# Patient Record
Sex: Male | Born: 1980 | Race: White | Hispanic: No | Marital: Single | Smoking: Current every day smoker
Health system: Southern US, Community
[De-identification: ages and names within clinical notes are randomized; demographics above are authoritative.]

## PROBLEM LIST (undated history)

## (undated) ENCOUNTER — Emergency Department: Admission: EM | Payer: Medicaid Other | Source: Home / Self Care

## (undated) DIAGNOSIS — I10 Essential (primary) hypertension: Secondary | ICD-10-CM

## (undated) DIAGNOSIS — F1010 Alcohol abuse, uncomplicated: Secondary | ICD-10-CM

## (undated) DIAGNOSIS — K219 Gastro-esophageal reflux disease without esophagitis: Secondary | ICD-10-CM

## (undated) DIAGNOSIS — G4730 Sleep apnea, unspecified: Secondary | ICD-10-CM

## (undated) DIAGNOSIS — F1910 Other psychoactive substance abuse, uncomplicated: Secondary | ICD-10-CM

## (undated) DIAGNOSIS — J189 Pneumonia, unspecified organism: Secondary | ICD-10-CM

## (undated) DIAGNOSIS — B1920 Unspecified viral hepatitis C without hepatic coma: Secondary | ICD-10-CM

## (undated) DIAGNOSIS — F32A Depression, unspecified: Secondary | ICD-10-CM

## (undated) DIAGNOSIS — F419 Anxiety disorder, unspecified: Secondary | ICD-10-CM

## (undated) DIAGNOSIS — R7303 Prediabetes: Secondary | ICD-10-CM

---
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
## 2003-07-23 ENCOUNTER — Other Ambulatory Visit: Payer: Self-pay

## 2004-09-25 ENCOUNTER — Emergency Department: Payer: Self-pay | Admitting: Emergency Medicine

## 2004-09-26 ENCOUNTER — Emergency Department: Payer: Self-pay | Admitting: Internal Medicine

## 2005-01-05 ENCOUNTER — Emergency Department: Payer: Self-pay | Admitting: Emergency Medicine

## 2005-03-08 ENCOUNTER — Emergency Department: Payer: Self-pay | Admitting: General Practice

## 2005-06-06 ENCOUNTER — Emergency Department: Payer: Self-pay | Admitting: Emergency Medicine

## 2005-07-03 ENCOUNTER — Emergency Department: Payer: Self-pay | Admitting: Emergency Medicine

## 2005-10-30 ENCOUNTER — Emergency Department: Payer: Self-pay | Admitting: Emergency Medicine

## 2006-02-23 ENCOUNTER — Inpatient Hospital Stay: Payer: Self-pay | Admitting: Unknown Physician Specialty

## 2006-04-15 ENCOUNTER — Emergency Department: Payer: Self-pay | Admitting: Emergency Medicine

## 2006-07-17 ENCOUNTER — Emergency Department: Payer: Self-pay | Admitting: Emergency Medicine

## 2006-09-11 ENCOUNTER — Emergency Department: Payer: Self-pay

## 2007-01-20 ENCOUNTER — Emergency Department: Payer: Self-pay

## 2007-03-29 ENCOUNTER — Emergency Department: Payer: Self-pay | Admitting: Emergency Medicine

## 2007-05-27 ENCOUNTER — Emergency Department: Payer: Self-pay | Admitting: Emergency Medicine

## 2007-11-08 ENCOUNTER — Emergency Department: Payer: Self-pay | Admitting: Emergency Medicine

## 2008-01-19 ENCOUNTER — Emergency Department: Payer: Self-pay | Admitting: Emergency Medicine

## 2008-02-19 ENCOUNTER — Emergency Department: Payer: Self-pay | Admitting: Emergency Medicine

## 2008-08-20 ENCOUNTER — Inpatient Hospital Stay: Payer: Self-pay | Admitting: Psychiatry

## 2008-09-12 ENCOUNTER — Emergency Department: Payer: Self-pay | Admitting: Internal Medicine

## 2010-07-10 ENCOUNTER — Emergency Department: Payer: Self-pay | Admitting: Emergency Medicine

## 2010-10-29 ENCOUNTER — Emergency Department: Payer: Self-pay | Admitting: Emergency Medicine

## 2010-12-29 ENCOUNTER — Emergency Department: Payer: Self-pay | Admitting: Emergency Medicine

## 2011-01-11 ENCOUNTER — Emergency Department: Payer: Self-pay | Admitting: Emergency Medicine

## 2011-01-29 ENCOUNTER — Emergency Department: Payer: Self-pay | Admitting: Emergency Medicine

## 2011-02-17 ENCOUNTER — Emergency Department: Payer: Self-pay | Admitting: Emergency Medicine

## 2011-03-22 ENCOUNTER — Emergency Department: Payer: Self-pay | Admitting: Emergency Medicine

## 2011-05-07 ENCOUNTER — Ambulatory Visit: Payer: Self-pay | Admitting: Gastroenterology

## 2011-05-11 ENCOUNTER — Emergency Department: Payer: Self-pay | Admitting: Unknown Physician Specialty

## 2011-06-24 ENCOUNTER — Emergency Department: Payer: Self-pay | Admitting: Emergency Medicine

## 2011-09-26 ENCOUNTER — Emergency Department: Payer: Self-pay | Admitting: Emergency Medicine

## 2011-09-26 LAB — COMPREHENSIVE METABOLIC PANEL
Alkaline Phosphatase: 73 U/L (ref 50–136)
Anion Gap: 9 (ref 7–16)
Calcium, Total: 8.6 mg/dL (ref 8.5–10.1)
Co2: 29 mmol/L (ref 21–32)
EGFR (Non-African Amer.): >60
Osmolality: 281 (ref 275–301)
Potassium: 4.5 mmol/L (ref 3.5–5.1)
SGPT (ALT): 169 U/L — ABNORMAL HIGH
Sodium: 141 mmol/L (ref 136–145)

## 2011-09-26 LAB — CBC
HCT: 42.6 % (ref 40.0–52.0)
HGB: 14.8 g/dL (ref 13.0–18.0)
MCHC: 34.8 g/dL (ref 32.0–36.0)
RBC: 4.46 10*6/uL (ref 4.40–5.90)

## 2012-07-23 ENCOUNTER — Emergency Department: Payer: Self-pay | Admitting: Emergency Medicine

## 2012-07-25 ENCOUNTER — Emergency Department: Payer: Self-pay | Admitting: Emergency Medicine

## 2012-07-25 LAB — RAPID INFLUENZA A&B ANTIGENS

## 2012-08-30 ENCOUNTER — Emergency Department: Payer: Self-pay | Admitting: Emergency Medicine

## 2012-12-17 ENCOUNTER — Emergency Department: Payer: Self-pay | Admitting: Emergency Medicine

## 2012-12-17 LAB — DRUG SCREEN, URINE
Amphetamines, Ur Screen: NEGATIVE (ref ?–1000)
Barbiturates, Ur Screen: NEGATIVE (ref ?–200)
Methadone, Ur Screen: NEGATIVE (ref ?–300)
Opiate, Ur Screen: POSITIVE (ref ?–300)
Phencyclidine (PCP) Ur S: NEGATIVE (ref ?–25)

## 2012-12-17 LAB — URINALYSIS, COMPLETE
Blood: NEGATIVE
Glucose,UR: NEGATIVE mg/dL (ref 0–75)
Ketone: NEGATIVE
Nitrite: NEGATIVE
Specific Gravity: 1.021 (ref 1.003–1.030)
WBC UR: 1 /HPF (ref 0–5)

## 2012-12-17 LAB — COMPREHENSIVE METABOLIC PANEL
BUN: 8 mg/dL (ref 7–18)
Co2: 30 mmol/L (ref 21–32)
EGFR (African American): >60
Glucose: 121 mg/dL — ABNORMAL HIGH (ref 65–99)
Potassium: 3.7 mmol/L (ref 3.5–5.1)
SGOT(AST): 39 U/L — ABNORMAL HIGH (ref 15–37)
Sodium: 135 mmol/L — ABNORMAL LOW (ref 136–145)
Total Protein: 8.4 g/dL — ABNORMAL HIGH (ref 6.4–8.2)

## 2012-12-17 LAB — CBC
HGB: 17.5 g/dL (ref 13.0–18.0)
MCHC: 34.9 g/dL (ref 32.0–36.0)
MCV: 95 fL (ref 80–100)
Platelet: 274 10*3/uL (ref 150–440)
RBC: 5.28 10*6/uL (ref 4.40–5.90)

## 2012-12-17 LAB — ETHANOL: Ethanol: 42 mg/dL

## 2013-01-13 ENCOUNTER — Emergency Department: Payer: Self-pay

## 2013-02-12 ENCOUNTER — Emergency Department: Payer: Self-pay | Admitting: Unknown Physician Specialty

## 2013-02-12 LAB — DRUG SCREEN, URINE
Amphetamines, Ur Screen: NEGATIVE (ref ?–1000)
Barbiturates, Ur Screen: NEGATIVE (ref ?–200)
Benzodiazepine, Ur Scrn: NEGATIVE (ref ?–200)
Cannabinoid 50 Ng, Ur ~~LOC~~: NEGATIVE (ref ?–50)
MDMA (Ecstasy)Ur Screen: POSITIVE (ref ?–500)
Opiate, Ur Screen: NEGATIVE (ref ?–300)
Phencyclidine (PCP) Ur S: NEGATIVE (ref ?–25)
Tricyclic, Ur Screen: NEGATIVE (ref ?–1000)

## 2013-02-12 LAB — COMPREHENSIVE METABOLIC PANEL
Alkaline Phosphatase: 106 U/L (ref 50–136)
Anion Gap: 10 (ref 7–16)
BUN: 8 mg/dL (ref 7–18)
Calcium, Total: 8.7 mg/dL (ref 8.5–10.1)
Co2: 23 mmol/L (ref 21–32)
Creatinine: 0.94 mg/dL (ref 0.60–1.30)
EGFR (African American): >60
EGFR (Non-African Amer.): >60
Osmolality: 278 (ref 275–301)
SGOT(AST): 74 U/L — ABNORMAL HIGH (ref 15–37)
Sodium: 139 mmol/L (ref 136–145)
Total Protein: 7.6 g/dL (ref 6.4–8.2)

## 2013-02-12 LAB — CBC
HCT: 47.2 % (ref 40.0–52.0)
HGB: 16.1 g/dL (ref 13.0–18.0)
MCH: 32.4 pg (ref 26.0–34.0)
MCHC: 34.2 g/dL (ref 32.0–36.0)
RDW: 13.4 % (ref 11.5–14.5)
WBC: 10.5 10*3/uL (ref 3.8–10.6)

## 2013-02-12 LAB — URINALYSIS, COMPLETE
Bacteria: NONE SEEN
Bilirubin,UR: NEGATIVE
RBC,UR: 2 /HPF (ref 0–5)
Specific Gravity: 1.019 (ref 1.003–1.030)
Squamous Epithelial: <1
WBC UR: 3 /HPF (ref 0–5)

## 2013-02-12 LAB — ETHANOL: Ethanol %: 0.042 % (ref 0.000–0.080)

## 2013-02-12 LAB — TSH: Thyroid Stimulating Horm: 1.3 u[IU]/mL

## 2013-03-21 ENCOUNTER — Emergency Department: Payer: Self-pay | Admitting: Emergency Medicine

## 2013-05-13 ENCOUNTER — Emergency Department: Payer: Self-pay | Admitting: Emergency Medicine

## 2016-07-07 ENCOUNTER — Emergency Department: Admission: EM | Admit: 2016-07-07 | Discharge: 2016-07-07 | Discharge status: Against Medical Advice | Payer: Self-pay

## 2016-07-08 ENCOUNTER — Encounter: Payer: Self-pay | Admitting: Medical Oncology

## 2016-07-08 ENCOUNTER — Emergency Department
Admission: EM | Admit: 2016-07-08 | Discharge: 2016-07-08 | Disposition: A | Discharge status: Home / Self Care | Payer: Medicaid Other | Attending: Emergency Medicine | Admitting: Emergency Medicine

## 2016-07-08 DIAGNOSIS — F17200 Nicotine dependence, unspecified, uncomplicated: Secondary | ICD-10-CM | POA: Diagnosis not present

## 2016-07-08 DIAGNOSIS — R51 Headache: Secondary | ICD-10-CM | POA: Diagnosis present

## 2016-07-08 DIAGNOSIS — K047 Periapical abscess without sinus: Secondary | ICD-10-CM

## 2016-07-08 DIAGNOSIS — Z79899 Other long term (current) drug therapy: Secondary | ICD-10-CM | POA: Diagnosis not present

## 2016-07-08 DIAGNOSIS — J0100 Acute maxillary sinusitis, unspecified: Secondary | ICD-10-CM | POA: Diagnosis not present

## 2016-07-08 MED ORDER — CLINDAMYCIN HCL 150 MG PO CAPS: 150.0000 mg | ORAL_CAPSULE | Freq: Four times a day (QID) | ORAL | 0 refills | Status: DC

## 2016-07-08 MED ORDER — NAPROXEN 500 MG PO TABS: 500.0000 mg | ORAL_TABLET | Freq: Two times a day (BID) | ORAL | Status: DC

## 2016-07-08 MED ORDER — TRAMADOL HCL 50 MG PO TABS: 50.0000 mg | ORAL_TABLET | Freq: Four times a day (QID) | ORAL | 0 refills | Status: DC | PRN

## 2016-07-08 MED ORDER — FEXOFENADINE-PSEUDOEPHED ER 60-120 MG PO TB12: 1.0000 | ORAL_TABLET | Freq: Two times a day (BID) | ORAL | 0 refills | Status: DC

## 2016-07-08 NOTE — ED Notes (Signed)
See triage note  States he is having pain to left side of face possible toothache   Also having some sinus pressure and draiange

## 2016-07-08 NOTE — ED Provider Notes (Signed)
[facility] Emergency Department Provider Note   ____________________________________________   First MD Initiated Contact with Patient 07/08/16 [id number]     (approximate)  I have reviewed the triage vital signs and the nursing notes.   HISTORY  Chief Complaint Dental Pain and Facial Pain    HPI Nicholas Valdez is a 35 y.o. male patient complaining of sinus pressure and dental pain for 1 week. Patient has a long-standing history of orthodontic problems. Patient state he is experiencing increased left facial pressure and nasal congestion for 1 week. Patient rated his pain as 8/10. No palliative measures for this complaint.  History reviewed. No pertinent past medical history.  There are no active problems to display for this patient.   History reviewed. No pertinent surgical history.  Prior to Admission medications   Medication Sig Start Date End Date Taking? Authorizing Provider  clindamycin (CLEOCIN) 150 MG capsule Take 1 capsule (150 mg total) by mouth 4 (four) times daily. 07/08/16   Joni Reiningonald K Farha Dano, PA-C  fexofenadine-pseudoephedrine (ALLEGRA-D) 60-120 MG 12 hr tablet Take 1 tablet by mouth 2 (two) times daily. 07/08/16   Joni Reiningonald K Anihya Tuma, PA-C  naproxen (NAPROSYN) 500 MG tablet Take 1 tablet (500 mg total) by mouth 2 (two) times daily with a meal. 07/08/16   Joni Reiningonald K Acelin Ferdig, PA-C  traMADol (ULTRAM) 50 MG tablet Take 1 tablet (50 mg total) by mouth every 6 (six) hours as needed for moderate pain. 07/08/16   Joni Reiningonald K Kamori Barbier, PA-C    Allergies Penicillins and Zofran Frazier Richards[ondansetron hcl]  No family history on file.  Social History Social History  Substance Use Topics  . Smoking status: Current Every Day Smoker  . Smokeless tobacco: Never Used  . Alcohol use No    Review of Systems Constitutional: No fever/chills Eyes: No visual changes. ENT: No sore throat. Dental and sinus pain  Cardiovascular: Denies chest pain. Respiratory: Denies shortness of  breath. Gastrointestinal: No abdominal pain.  No nausea, no vomiting.  No diarrhea.  No constipation. Genitourinary: Negative for dysuria. Musculoskeletal: Negative for back pain. Skin: Negative for rash. Neurological: Negative for headaches, focal weakness or numbness. Allergic/Immunilogical: See medication list _________________________________________   PHYSICAL EXAM:  VITAL SIGNS: ED Triage Vitals  Enc Vitals Group     BP 07/08/16 0757 (!) 148/96     Pulse Rate 07/08/16 0757 78     Resp 07/08/16 0757 18     Temp 07/08/16 0757 97.8 F (36.6 C)     Temp Source 07/08/16 0757 Oral     SpO2 07/08/16 0757 98 %     Weight 07/08/16 0754 215 lb (97.5 kg)     Height 07/08/16 0754 6' (1.829 m)     Head Circumference --      Peak Flow --      Pain Score 07/08/16 0754 8     Pain Loc --      Pain Edu? --      Excl. in GC? --     Constitutional: Alert and oriented. Well appearing and in no acute distress. Eyes: Conjunctivae are normal. PERRL. EOMI. Head: Atraumatic. Nose: Edematous nasal turbinates with thick rhinorrhea. Bilateral maxillary guarding. Mouth/Throat: Mucous membranes are moist.  Oropharynx non-erythematous.Devitalize tooth December 2 and 3 Neck: No stridor.  No cervical spine tenderness to palpation. Hematological/Lymphatic/Immunilogical: No cervical lymphadenopathy. Cardiovascular: Normal rate, regular rhythm. Grossly normal heart sounds.  Good peripheral circulation. Respiratory: Normal respiratory effort.  No retractions. Lungs CTAB. Gastrointestinal: Soft and nontender. No  distention. No abdominal bruits. No CVA tenderness. Musculoskeletal: No lower extremity tenderness nor edema.  No joint effusions. Neurologic:  Normal speech and language. No gross focal neurologic deficits are appreciated. No gait instability. Skin:  Skin is warm, dry and intact. No rash noted. Psychiatric: Mood and affect are normal. Speech and behavior are  normal.  ____________________________________________   LABS (all labs ordered are listed, but only abnormal results are displayed)  Labs Reviewed - No data to display ____________________________________________  EKG   ____________________________________________  RADIOLOGY   ____________________________________________   PROCEDURES  Procedure(s) performed: None  Procedures  Critical Care performed: No  ____________________________________________   INITIAL IMPRESSION / ASSESSMENT AND PLAN / ED COURSE  Pertinent labs & imaging results that were available during my care of the patient were reviewed by me and considered in my medical decision making (see chart for details).  Dental pain and sinusitis. Patient given discharge care instructions. Patient advised follow-up with the [facility] dental clinic.  Clinical Course      ____________________________________________   FINAL CLINICAL IMPRESSION(S) / ED DIAGNOSES  Final diagnoses:  Dental infection  Subacute maxillary sinusitis      NEW MEDICATIONS STARTED DURING THIS VISIT:  New Prescriptions   CLINDAMYCIN (CLEOCIN) 150 MG CAPSULE    Take 1 capsule (150 mg total) by mouth 4 (four) times daily.   FEXOFENADINE-PSEUDOEPHEDRINE (ALLEGRA-D) 60-120 MG 12 HR TABLET    Take 1 tablet by mouth 2 (two) times daily.   NAPROXEN (NAPROSYN) 500 MG TABLET    Take 1 tablet (500 mg total) by mouth 2 (two) times daily with a meal.   TRAMADOL (ULTRAM) 50 MG TABLET    Take 1 tablet (50 mg total) by mouth every 6 (six) hours as needed for moderate pain.     Note:  This document was prepared using Dragon voice recognition software and may include unintentional dictation errors.    Joni Reining, PA-C 07/08/16 0900    Rockne Menghini, MD 07/08/16 1610

## 2016-07-08 NOTE — ED Triage Notes (Signed)
Pt reports sinus pressure/pain and dental pain for 1 week.

## 2016-09-17 ENCOUNTER — Emergency Department
Admission: EM | Admit: 2016-09-17 | Discharge: 2016-09-17 | Disposition: A | Discharge status: Home / Self Care | Payer: Medicaid Other | Attending: Emergency Medicine | Admitting: Emergency Medicine

## 2016-09-17 ENCOUNTER — Encounter: Payer: Self-pay | Admitting: Emergency Medicine

## 2016-09-17 DIAGNOSIS — J111 Influenza due to unidentified influenza virus with other respiratory manifestations: Secondary | ICD-10-CM | POA: Insufficient documentation

## 2016-09-17 DIAGNOSIS — F17200 Nicotine dependence, unspecified, uncomplicated: Secondary | ICD-10-CM | POA: Diagnosis not present

## 2016-09-17 DIAGNOSIS — R05 Cough: Secondary | ICD-10-CM | POA: Diagnosis present

## 2016-09-17 MED ORDER — OSELTAMIVIR PHOSPHATE 75 MG PO CAPS: 75.0000 mg | ORAL_CAPSULE | Freq: Two times a day (BID) | ORAL | 0 refills | Status: AC

## 2016-09-17 MED ORDER — BENZONATATE 100 MG PO CAPS: 100.0000 mg | ORAL_CAPSULE | Freq: Three times a day (TID) | ORAL | 0 refills | Status: AC | PRN

## 2016-09-17 NOTE — ED Triage Notes (Signed)
Pt with fever, congestion and diarrhea.

## 2016-09-17 NOTE — ED Provider Notes (Signed)
[facility] Emergency Department Provider Note  ____________________________________________  Time seen: Approximately 6:55 PM  I have reviewed the triage vital signs and the nursing notes.   HISTORY  Chief Complaint Fever and Nasal Congestion    HPI Nicholas Valdez is a 36 y.o. male presenting to the emergency department with headache, congestion, rhinorrhea, nonproductive cough, diarrhea, myalgias and fever for the past three days. Fever has been as high as 101F assessed orally. Patient's daughter was diagnosed with influenza last week.Patient is tolerating fluids by mouth. He has been eating soup. Patient denies chest pain, chest tightness, shortness of breath, abdominal pain, nausea and vomiting. Patient has been taking Sudafed. No other alleviating measures have been attempted. Patient is a daily smoker.     History reviewed. No pertinent past medical history.  There are no active problems to display for this patient.   History reviewed. No pertinent surgical history.  Prior to Admission medications   Medication Sig Start Date End Date Taking? Authorizing Provider  benzonatate (TESSALON PERLES) 100 MG capsule Take 1 capsule (100 mg total) by mouth 3 (three) times daily as needed for cough. 09/17/16 09/24/16  Orvil Feil, PA-C  clindamycin (CLEOCIN) 150 MG capsule Take 1 capsule (150 mg total) by mouth 4 (four) times daily. 07/08/16   Joni Reining, PA-C  fexofenadine-pseudoephedrine (ALLEGRA-D) 60-120 MG 12 hr tablet Take 1 tablet by mouth 2 (two) times daily. 07/08/16   Joni Reining, PA-C  naproxen (NAPROSYN) 500 MG tablet Take 1 tablet (500 mg total) by mouth 2 (two) times daily with a meal. 07/08/16   Joni Reining, PA-C  oseltamivir (TAMIFLU) 75 MG capsule Take 1 capsule (75 mg total) by mouth 2 (two) times daily. 09/17/16 09/22/16  Orvil Feil, PA-C  traMADol (ULTRAM) 50 MG tablet Take 1 tablet (50 mg total) by mouth every 6 (six) hours as  needed for moderate pain. 07/08/16   Joni Reining, PA-C    Allergies Penicillins and Zofran Frazier Richards hcl]  No family history on file.  Social History Social History  Substance Use Topics  . Smoking status: Current Every Day Smoker  . Smokeless tobacco: Never Used  . Alcohol use No    Review of Systems  Constitutional: Patient has had fever.  Eyes: No visual changes. No discharge ENT: Patient has had congestion.  Cardiovascular: no chest pain. Respiratory: Patient has had non-productive cough.  No SOB. Gastrointestinal: Patient has had nausea.  Genitourinary: Negative for dysuria. No hematuria Musculoskeletal: Patient has had myalgias. Skin: Negative for rash, abrasions, lacerations, ecchymosis. Neurological: Negative for headaches, focal weakness or numbness.   ____________________________________________   PHYSICAL EXAM:  VITAL SIGNS: ED Triage Vitals  Enc Vitals Group     BP 09/17/16 1600 114/75     Pulse Rate 09/17/16 1600 (!) 116     Resp 09/17/16 1600 20     Temp 09/17/16 1600 97.5 F (36.4 C)     Temp Source 09/17/16 1600 Oral     SpO2 09/17/16 1600 99 %     Weight 09/17/16 1600 230 lb (104.3 kg)     Height 09/17/16 1600 6' (1.829 m)     Head Circumference --      Peak Flow --      Pain Score 09/17/16 1601 3     Pain Loc --      Pain Edu? --      Excl. in GC? --      Constitutional: Alert and  oriented. Patient is lying supine in bed.  Eyes: Conjunctivae are normal. PERRL. EOMI. Head: Atraumatic. ENT:      Ears: Tympanic membranes are injected bilaterally without evidence of effusion or purulent exudate. Bony landmarks are visualized bilaterally. No pain with palpation at the tragus.      Nose: Nasal turbinates are edematous and erythematous. Trace rhinorrhea visualized.      Mouth/Throat: Mucous membranes are moist. Posterior pharynx is mildly erythematous. No tonsillar hypertrophy or purulent exudate. Uvula is midline. Neck: Full range of  motion. No pain is elicited with flexion at the neck. Hematological/Lymphatic/Immunilogical: No cervical lymphadenopathy. Cardiovascular: Normal rate, regular rhythm. Normal S1 and S2.  Good peripheral circulation. Respiratory: Normal respiratory effort without tachypnea or retractions. Lungs CTAB. Good air entry to the bases with no decreased or absent breath sounds. Gastrointestinal: Bowel sounds 4 quadrants. Soft and nontender to palpation. No guarding or rigidity. No palpable masses. No distention. No CVA tenderness.  Skin:  Skin is warm, dry and intact. No rash noted. Psychiatric: Mood and affect are normal. Speech and behavior are normal. Patient exhibits appropriate insight and judgement.  ____________________________________________   LABS (all labs ordered are listed, but only abnormal results are displayed)  Labs Reviewed - No data to display ____________________________________________  EKG   ____________________________________________  RADIOLOGY   No results found.  ____________________________________________    PROCEDURES  Procedure(s) performed:    Procedures    Medications - No data to display   ____________________________________________   INITIAL IMPRESSION / ASSESSMENT AND PLAN / ED COURSE  Pertinent labs & imaging results that were available during my care of the patient were reviewed by me and considered in my medical decision making (see chart for details).  Review of the [place] CSRS was performed in accordance of the [facility] prior to dispensing any controlled drugs.     Assessment and Plan: Influenza:  Patient presents to the emergency department with headache, congestion, rhinorrhea, nonproductive cough, diarrhea, myalgias and fever for the past three days. Patient's symptoms are consistent with influenza. Patient was discharged with Tamiflu and Tessalon Perles. Patient was advised to follow-up with his primary care provider in one week.  Physical exam and vital signs are reassuring at this time. All patient questions were answered.  ____________________________________________  FINAL CLINICAL IMPRESSION(S) / ED DIAGNOSES  Final diagnoses:  Influenza      NEW MEDICATIONS STARTED DURING THIS VISIT:  Discharge Medication List as of 09/17/2016  7:03 PM    START taking these medications   Details  benzonatate (TESSALON PERLES) 100 MG capsule Take 1 capsule (100 mg total) by mouth 3 (three) times daily as needed for cough., Starting Tue 09/17/2016, Until Tue 09/24/2016, Print    oseltamivir (TAMIFLU) 75 MG capsule Take 1 capsule (75 mg total) by mouth 2 (two) times daily., Starting Tue 09/17/2016, Until Sun 09/22/2016, Print            This chart was dictated using voice recognition software/Dragon. Despite best efforts to proofread, errors can occur which can change the meaning. Any change was purely unintentional.    Orvil Feil, PA-C 09/18/16 0041    Governor Rooks, MD 09/18/16 [phone]

## 2016-09-17 NOTE — ED Notes (Addendum)
Pt reports that he has been runny a fever this weekend with a max of 101 - his daughter had the flu last week and he wants to be checked - he has nasal congestion with a cough - he would like a work note for tonight - pt also reports having diarrhea since Saturday (4-5 loose stools in the last 24 hours)

## 2016-10-01 ENCOUNTER — Emergency Department
Admission: EM | Admit: 2016-10-01 | Discharge: 2016-10-01 | Disposition: A | Discharge status: Home / Self Care | Payer: Medicaid Other | Attending: Emergency Medicine | Admitting: Emergency Medicine

## 2016-10-01 ENCOUNTER — Encounter: Payer: Self-pay | Admitting: *Deleted

## 2016-10-01 DIAGNOSIS — B349 Viral infection, unspecified: Secondary | ICD-10-CM | POA: Diagnosis not present

## 2016-10-01 DIAGNOSIS — F17200 Nicotine dependence, unspecified, uncomplicated: Secondary | ICD-10-CM | POA: Diagnosis not present

## 2016-10-01 DIAGNOSIS — K029 Dental caries, unspecified: Secondary | ICD-10-CM | POA: Insufficient documentation

## 2016-10-01 DIAGNOSIS — R0981 Nasal congestion: Secondary | ICD-10-CM | POA: Diagnosis present

## 2016-10-01 MED ORDER — IBUPROFEN 600 MG PO TABS: 600.0000 mg | ORAL_TABLET | Freq: Three times a day (TID) | ORAL | 0 refills | Status: DC | PRN

## 2016-10-01 MED ORDER — PROMETHAZINE HCL 25 MG PO TABS: 25.0000 mg | ORAL_TABLET | Freq: Three times a day (TID) | ORAL | 0 refills | Status: DC | PRN

## 2016-10-01 MED ORDER — PSEUDOEPH-BROMPHEN-DM 30-2-10 MG/5ML PO SYRP: 5.0000 mL | ORAL_SOLUTION | Freq: Four times a day (QID) | ORAL | 0 refills | Status: DC | PRN

## 2016-10-01 NOTE — ED Triage Notes (Signed)
States he was diagnosed with the flu last week and states still not feeling nay better and he needs a work note, also states he needs information about dental clinic, awake and alert in no acute distress

## 2016-10-01 NOTE — Discharge Instructions (Signed)
May follow-up for list of dental clinics provided. OPTIONS FOR DENTAL FOLLOW UP CARE  [place] Department of Health and Human Services - Local Safety Net Dental Clinics [email or web address]   [facility] [phone])  [facility] [phone])  [facility] [phone])  [facility] [phone])  [facility] [phone]) This clinic caters to the indigent population and is on a lottery system. Location: [employer] of Dentistry, [employer], [address] [place] Clinic Hours: Wednesdays from 6pm - 9pm, patients seen by a lottery system. For dates, call or go to [email or web address] Services: Cleanings, fillings and simple extractions. Payment Options: DENTAL WORK IS FREE OF CHARGE. Bring proof of income or support. Best way to get seen: Arrive at 5:15 pm - this is a lottery, NOT first come/first serve, so arriving earlier will not increase your chances of being seen.     [facility] [phone] Select option 1 for emergencies   Location: [facility]istry, [place], [address] [place] Clinic Hours: No walk-ins accepted - call the day before to schedule an appointment. Check in times are 9:30 am and 1:30 pm. Services: Simple extractions, temporary fillings, pulpectomy/pulp debridement, uncomplicated abscess drainage. Payment Options: PAYMENT IS DUE AT THE TIME OF SERVICE.  Fee is usually $100-200, additional surgical procedures (e.g. abscess drainage) may be extra. Cash, checks, Visa/MasterCard accepted.  Can file Medicaid if patient is covered for dental - patient should call case worker to check. No discount for [facility] patients. Best way to get seen: MUST call the day before and get onto the schedule. Can usually be seen the next 1-2 days. No walk-ins accepted.      [facility] [phone]   Location: [facility], [address] [place] Clinic Hours: M, W, Th, F 8am or 1:30pm, Tues 9a or 1:30 - first come/first served. Services: Simple extractions, temporary fillings, uncomplicated abscess drainage.  You do not need to be an [facility] resident. Payment Options: PAYMENT IS DUE AT THE TIME OF SERVICE. Dental insurance, otherwise sliding scale - bring proof of income or support. Depending on income and treatment needed, cost is usually $50-200. Best way to get seen: Arrive early as it is first come/first served.     [facility] [phone]   Location: [address] Clinic Hours: Mon-Thu 8a-5p Services: Most basic dental services including extractions and fillings. Payment Options: PAYMENT IS DUE AT THE TIME OF SERVICE. Sliding scale, up to 50% off - bring proof if income or support. Medicaid with dental option accepted. Best way to get seen: Call to schedule an appointment, can usually be seen within 2 weeks OR they will try to see walk-ins - show up at 8a or 2p (you may have to wait).     [facility] [phone] [place] RESIDENTS ONLY   Location: [facility], [address], [place], [place] [address] Clinic Hours: By appointment only. Monday - Thursday 8am-5pm, Friday 8am-12pm Services: Cleanings, fillings, extractions. Payment Options: PAYMENT IS DUE AT THE TIME OF SERVICE. Cash, Visa or MasterCard. Sliding scale - $30 minimum per service. Best way to get seen: Come in to office, complete packet and make an appointment - need proof of income or support monies for each household member and proof of [facility] residence. Usually takes about a month to get in.     [facility] °[phone] °  °Location: °[address] [place] °Clinic Hours: Walk-in Urgent Care  Dental Services are offered Monday-Friday mornings only. °The numbers of emergencies accepted daily is limited to the number of °providers available. °Maximum 15 - Mondays, Wednesdays & Thursdays °Maximum 10 - Tuesdays & Fridays °Services: °You do not need to be a [place] resident to be seen for a dental emergency. °Emergencies are defined as pain, swelling, abnormal bleeding, or dental trauma. Walkins will receive x-rays if needed. °NOTE: Dental cleaning is not an emergency. °Payment Options: °PAYMENT IS DUE AT THE TIME OF SERVICE. °Minimum co-pay is $40.00 for uninsured patients. °Minimum co-pay is $3.00 for Medicaid with dental coverage. °Dental Insurance is accepted and must be presented at time of visit. °Medicare does not cover dental. °Forms of payment: Cash, credit card, checks. °Best way to get seen: °If not previously registered with the clinic, walk-in dental registration begins at 7:15 am and is on a first come/first serve basis. °If previously registered with the clinic, call to make an appointment. °  °  °[facility] °[phone] °[place] RESIDENTS ONLY °  °Location: °[address], [place], [place] °Clinic Hours: °Mon-Thu 10a-2p °Services: Extractions only! °Payment Options: °FREE (donations accepted) - bring proof of income or support °Best way to get seen: °Call and schedule an appointment OR come at 8am on the 1st Monday of every month (except for holidays) when it is first come/first served. °  °  °[facility] °[phone] °  °Location: °[address], [place] °Clinic Hours: °Friday mornings °Services, Payment Options, Best way to get seen: °Call for info ° °

## 2016-10-01 NOTE — ED Provider Notes (Signed)
[facility] Emergency Department Provider Note   ____________________________________________   First MD Initiated Contact with Patient 10/01/16 1606     (approximate)  I have reviewed the triage vital signs and the nursing notes.   HISTORY  Chief Complaint Cough; Generalized Body Aches; and Dental Pain    HPI Nicholas Valdez is a 36 y.o. male patient states having flulike symptoms even though he was tested and treated for flu which and half ago. Patient state he is having nasal congestion, runny nose, body aches, and nausea with vomiting. Patient also states dental pain secondary to a cracked tooth which occurred 2 days ago. Patient requested information for dental clinic.Patient rates his pain as a 3/10. Patient describes pain as "achy". No palliative measures complaints.   History reviewed. No pertinent past medical history.  There are no active problems to display for this patient.   History reviewed. No pertinent surgical history.  Prior to Admission medications   Medication Sig Start Date End Date Taking? Authorizing Provider  brompheniramine-pseudoephedrine-DM 30-2-10 MG/5ML syrup Take 5 mLs by mouth 4 (four) times daily as needed. 10/01/16   Joni Reining, PA-C  clindamycin (CLEOCIN) 150 MG capsule Take 1 capsule (150 mg total) by mouth 4 (four) times daily. 07/08/16   Joni Reining, PA-C  fexofenadine-pseudoephedrine (ALLEGRA-D) 60-120 MG 12 hr tablet Take 1 tablet by mouth 2 (two) times daily. 07/08/16   Joni Reining, PA-C  ibuprofen (ADVIL,MOTRIN) 600 MG tablet Take 1 tablet (600 mg total) by mouth every 8 (eight) hours as needed. 10/01/16   Joni Reining, PA-C  naproxen (NAPROSYN) 500 MG tablet Take 1 tablet (500 mg total) by mouth 2 (two) times daily with a meal. 07/08/16   Joni Reining, PA-C  promethazine (PHENERGAN) 25 MG tablet Take 1 tablet (25 mg total) by mouth every 8 (eight) hours as needed for nausea or vomiting. 10/01/16    Joni Reining, PA-C  traMADol (ULTRAM) 50 MG tablet Take 1 tablet (50 mg total) by mouth every 6 (six) hours as needed for moderate pain. 07/08/16   Joni Reining, PA-C    Allergies Penicillins and Zofran Frazier Richards hcl]  History reviewed. No pertinent family history.  Social History Social History  Substance Use Topics  . Smoking status: Current Every Day Smoker  . Smokeless tobacco: Never Used  . Alcohol use No    Review of Systems Constitutional: No fever/chills. Body aches Eyes: No visual changes. ENT: No sore throat. Nasal congestion intermittently rhinorrhea Cardiovascular: Denies chest pain. Respiratory: Denies shortness of breath. Gastrointestinal: No abdominal pain.  Nausea and vomiting.  No diarrhea.  No constipation. Genitourinary: Negative for dysuria. Musculoskeletal: Negative for back pain. Skin: Negative for rash. Neurological: Negative for headaches, focal weakness or numbness. Allergic/Immunilogical: Zofran and penicillin ___________________________________________   PHYSICAL EXAM:  VITAL SIGNS: ED Triage Vitals  Enc Vitals Group     BP 10/01/16 1535 (!) 137/94     Pulse Rate 10/01/16 1535 76     Resp 10/01/16 1535 18     Temp 10/01/16 1535 98 F (36.7 C)     Temp Source 10/01/16 1535 Oral     SpO2 10/01/16 1535 97 %     Weight 10/01/16 1537 240 lb (108.9 kg)     Height 10/01/16 1537 6' (1.829 m)     Head Circumference --      Peak Flow --      Pain Score 10/01/16 1535 3  Pain Loc --      Pain Edu? --      Excl. in GC? --     Constitutional: Alert and oriented. Well appearing and in no acute distress. Eyes: Conjunctivae are normal. PERRL. EOMI. Head: Atraumatic. Nose:Nasal congestion and clear rhinorrhea  Mouth/Throat: Mucous membranes are moist.  Oropharynx non-erythematous. Fractured tooth #10  Neck: No stridor.  No cervical spine tenderness to palpation. Hematological/Lymphatic/Immunilogical: No cervical  lymphadenopathy. Cardiovascular: Normal rate, regular rhythm. Grossly normal heart sounds.  Good peripheral circulation. Respiratory: Normal respiratory effort.  No retractions. Lungs CTAB. Gastrointestinal: Soft and nontender. No distention. No abdominal bruits. No CVA tenderness. Musculoskeletal: No lower extremity tenderness nor edema.  No joint effusions. Neurologic:  Normal speech and language. No gross focal neurologic deficits are appreciated. No gait instability. Skin:  Skin is warm, dry and intact. No rash noted. Psychiatric: Mood and affect are normal. Speech and behavior are normal.  ____________________________________________   LABS (all labs ordered are listed, but only abnormal results are displayed)  Labs Reviewed - No data to display ____________________________________________  EKG   ____________________________________________  RADIOLOGY   ____________________________________________   PROCEDURES  Procedure(s) performed: None  Procedures  Critical Care performed: No  ____________________________________________   INITIAL IMPRESSION / ASSESSMENT AND PLAN / ED COURSE  Pertinent labs & imaging results that were available during my care of the patient were reviewed by me and considered in my medical decision making (see chart for details).  Viral syndrome. Patient given discharge instructions. Patient Given a List of Dental Clinics for Follow-Up Care. Patient given a prescription for Bromfed-DM, Phenergan, and ibuprofen. Patient given a work note.      ____________________________________________   FINAL CLINICAL IMPRESSION(S) / ED DIAGNOSES  Final diagnoses:  Viral syndrome  Pain due to dental caries      NEW MEDICATIONS STARTED DURING THIS VISIT:  New Prescriptions   BROMPHENIRAMINE-PSEUDOEPHEDRINE-DM 30-2-10 MG/5ML SYRUP    Take 5 mLs by mouth 4 (four) times daily as needed.   IBUPROFEN (ADVIL,MOTRIN) 600 MG TABLET    Take 1 tablet  (600 mg total) by mouth every 8 (eight) hours as needed.   PROMETHAZINE (PHENERGAN) 25 MG TABLET    Take 1 tablet (25 mg total) by mouth every 8 (eight) hours as needed for nausea or vomiting.     Note:  This document was prepared using Dragon voice recognition software and may include unintentional dictation errors.    Joni Reining, PA-C 10/01/16 1630    Emily Filbert, MD 10/02/16 [phone]

## 2016-10-01 NOTE — ED Notes (Signed)
Pt reports having the flu recently and continues to have sx.  States not feeling any better.  Pt also requesting a note for work.out of work yesterday.  Pt request info for dental clinic for right upper tooth pain. Pt alert.

## 2017-05-05 ENCOUNTER — Emergency Department
Admission: EM | Admit: 2017-05-05 | Discharge: 2017-05-05 | Disposition: A | Discharge status: Home / Self Care | Payer: Medicaid Other | Attending: Emergency Medicine | Admitting: Emergency Medicine

## 2017-05-05 ENCOUNTER — Encounter: Payer: Self-pay | Admitting: Emergency Medicine

## 2017-05-05 DIAGNOSIS — Z79899 Other long term (current) drug therapy: Secondary | ICD-10-CM | POA: Diagnosis not present

## 2017-05-05 DIAGNOSIS — M5416 Radiculopathy, lumbar region: Secondary | ICD-10-CM | POA: Diagnosis not present

## 2017-05-05 DIAGNOSIS — F17200 Nicotine dependence, unspecified, uncomplicated: Secondary | ICD-10-CM | POA: Diagnosis not present

## 2017-05-05 DIAGNOSIS — R200 Anesthesia of skin: Secondary | ICD-10-CM | POA: Diagnosis present

## 2017-05-05 HISTORY — DX: Unspecified viral hepatitis C without hepatic coma: B19.20

## 2017-05-05 MED ORDER — KETOROLAC TROMETHAMINE 30 MG/ML IJ SOLN
30.0000 mg | Freq: Once | INTRAMUSCULAR | Status: AC
  Administered 2017-05-05: 30 mg via INTRAMUSCULAR
  Filled 2017-05-05: qty 1

## 2017-05-05 MED ORDER — PREDNISONE 10 MG PO TABS: 10.0000 mg | ORAL_TABLET | Freq: Every day | ORAL | 0 refills | Status: DC

## 2017-05-05 MED ORDER — DEXAMETHASONE SODIUM PHOSPHATE 10 MG/ML IJ SOLN
10.0000 mg | Freq: Once | INTRAMUSCULAR | Status: AC
  Administered 2017-05-05: 10 mg via INTRAMUSCULAR
  Filled 2017-05-05: qty 1

## 2017-05-05 NOTE — ED Provider Notes (Signed)
[facility]-EMERGENCY DEPARTMENT Provider Note   CSN: [id number] Arrival date & time: 05/05/17  1500     History   Chief Complaint Chief Complaint  Patient presents with  . leg numbness    HPI Nicholas Valdez is a 36 y.o. male presents to the emergency department for evaluation of lower back pain with left lumbar radicular symptoms. Patient states around 6 AM this morning he developed pain numbness and tingling going down the left posterior thigh, posterior calf and into the bottom of his left foot. Pain has been moderate to severe. He denies any trauma or injury.No loss of bowel or bladder symptoms. He is ambulatory with a cane.patient states he took oxycodone last night while drinking alcohol. Has not had any oxycodone today. Oxycodone was taken as a recreational drug.patient has not had any medications today. 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  HPI  Past Medical History:  Diagnosis Date  . Hepatitis C     There are no active problems to display for this patient.   History reviewed. No pertinent surgical history.     Home Medications    Prior to Admission medications   Medication Sig Start Date End Date Taking? Authorizing Provider  brompheniramine-pseudoephedrine-DM 30-2-10 MG/5ML syrup Take 5 mLs by mouth 4 (four) times daily as needed. 10/01/16   Joni Reining, PA-C  clindamycin (CLEOCIN) 150 MG capsule Take 1 capsule (150 mg total) by mouth 4 (four) times daily. 07/08/16   Joni Reining, PA-C  fexofenadine-pseudoephedrine (ALLEGRA-D) 60-120 MG 12 hr tablet Take 1 tablet by mouth 2 (two) times daily. 07/08/16   Joni Reining, PA-C  ibuprofen (ADVIL,MOTRIN) 600 MG tablet Take 1 tablet (600 mg total) by mouth every 8 (eight) hours as needed. 10/01/16   Joni Reining, PA-C  naproxen (NAPROSYN) 500 MG tablet Take 1 tablet (500 mg total) by mouth 2 (two) times daily with a meal. 07/08/16   Joni Reining, PA-C  predniSONE  (DELTASONE) 10 MG tablet Take 1 tablet (10 mg total) by mouth daily. 6,5,4,3,2,1 six day taper 05/05/17   Evon Slack, PA-C  promethazine (PHENERGAN) 25 MG tablet Take 1 tablet (25 mg total) by mouth every 8 (eight) hours as needed for nausea or vomiting. 10/01/16   Joni Reining, PA-C  traMADol (ULTRAM) 50 MG tablet Take 1 tablet (50 mg total) by mouth every 6 (six) hours as needed for moderate pain. 07/08/16   Joni Reining, PA-C    Family History No family history on file.  Social History Social History  Substance Use Topics  . Smoking status: Current Every Day Smoker  . Smokeless tobacco: Never Used  . Alcohol use Yes     Allergies   Penicillins and Zofran [ondansetron hcl]   Review of Systems Review of Systems  Constitutional: Negative for fever.  Respiratory: Negative for shortness of breath.   Cardiovascular: Negative for chest pain.  Gastrointestinal: Negative for abdominal pain.  Genitourinary: Negative for difficulty urinating, dysuria and urgency.  Musculoskeletal: Positive for back pain and gait problem. Negative for myalgias and neck pain.  Skin: Negative for rash and wound.  Neurological: Positive for numbness. Negative for dizziness, weakness and headaches.     Physical Exam Updated Vital Signs BP 133/79 (BP Location: Left Arm)   Pulse 99   Temp 98.8 F (37.1 C) (Oral)   Resp 16   Ht 6' (1.829 m)   Wt 108.9 kg (  240 lb)   SpO2 98%   BMI 32.55 kg/m   Physical Exam  Constitutional: He is oriented to person, place, and time. He appears well-developed and well-nourished.  HENT:  Head: Normocephalic and atraumatic.  Eyes: Conjunctivae are normal.  Neck: Normal range of motion.  Cardiovascular: Normal rate.   Pulmonary/Chest: Effort normal. No respiratory distress.  Abdominal: Soft. He exhibits no distension. There is no tenderness. There is no guarding.  Musculoskeletal: Normal range of motion.  Lumbar Spine: Examination of the lumbar spine  reveals no bony abnormality, no edema, and no ecchymosis.  There is no step off.  The patient has full decreased range of motion of the lumbar spine with flexion and extension.  The patient has decreased lateral bend and rotation.  The patient has moderate pain with lumbar flexion and extension. The patient is non tender along the spinous process.  The patient is non tender along the paravertebral muscles, with no muscle spasms.  The patient is non tender along the iliac crest.  The patient is non tender in the sciatic notch.  The patient is non tender along the Sacroiliac joint.  There is no Coccyx joint tenderness.    Bilateral Lower Extremities: Examination of the lower extremities reveals no bony abnormality, no edema, and no ecchymosis.  The patient has full active and passive range of motion of the hips, knees, and ankles.  There is no discomfort with range of motion exercises.  The patient is non tender along the greater trochanter region.  The patient has a negative Denna Haggard' test bilaterally.  There is normal skin warmth.  There is normal capillary refill bilaterally.    Neurologic: The patient has a positive left straight leg raise.  The patient has normal muscle strength testing for the quadriceps, calves, ankle dorsiflexion, ankle plantarflexion, and extensor hallicus longus.  The patient has sensation that is intact to light touch.  The deep tendon reflexes are normal at the patella and achilles with no ankle clonus noted.   Neurological: He is alert and oriented to person, place, and time.  Skin: Skin is warm. No rash noted.  Psychiatric: He has a normal mood and affect. His behavior is normal. Thought content normal.     ED Treatments / Results  Labs (all labs ordered are listed, but only abnormal results are displayed) Labs Reviewed - No data to display  EKG  EKG Interpretation None       Radiology No results found.  Procedures Procedures (including critical care  time)  Medications Ordered in ED Medications  ketorolac (TORADOL) 30 MG/ML injection 30 mg (30 mg Intramuscular Given 05/05/17 1806)  dexamethasone (DECADRON) injection 10 mg (10 mg Intramuscular Given 05/05/17 1806)     Initial Impression / Assessment and Plan / ED Course  I have reviewed the triage vital signs and the nursing notes.  Pertinent labs & imaging results that were available during my care of the patient were reviewed by me and considered in my medical decision making (see chart for details).     36 year old male with acute left lumbar radiculopathy. He is able to stand, ambulate with mild antalgic gait. Pain is improved with Toradol and dexamethasone. No neurological deficits in the lower extremities. Patient's given 6 day steroid taper, work note and will follow-up with orthopedics. He is educated on signs and symptoms return to the ED for.  Final Clinical Impressions(s) / ED Diagnoses   Final diagnoses:  Left lumbar radiculopathy    New Prescriptions New  Prescriptions   PREDNISONE (DELTASONE) 10 MG TABLET    Take 1 tablet (10 mg total) by mouth daily. 6,5,4,3,2,1 six day taper     Ronnette Juniper 05/05/17 1853    Jeanmarie Plant, MD 05/05/17 2208

## 2017-05-05 NOTE — ED Triage Notes (Addendum)
C/O left leg numbness and right lower back/ hip numbness / pain.  States noticed numbness this morning when he woke up, at around 0600.  States when he went to bed last night, leg was hurting "from work".  Patient states he took 4 tablets of Oxycodone 5 mg (not prescribed to him) for leg pain.

## 2017-05-05 NOTE — ED Notes (Signed)
Pt woke up with low back pain radiating into left leg with numbness    No known injury  Pt using a cane today.  Pt alert.  Speech clear.

## 2017-05-05 NOTE — Discharge Instructions (Signed)
Please take prednisone as prescribed. Avoid any lifting pushing or pulling for the next week. Call orthopedic office tomorrow to schedule follow-up appointment. Return to the emergency department for any increasing pain, weakness, worsening symptoms or c

## 2017-05-15 ENCOUNTER — Emergency Department
Admission: EM | Admit: 2017-05-15 | Discharge: 2017-05-15 | Disposition: A | Discharge status: Home / Self Care | Payer: Medicaid Other | Attending: Emergency Medicine | Admitting: Emergency Medicine

## 2017-05-15 ENCOUNTER — Encounter: Payer: Self-pay | Admitting: Emergency Medicine

## 2017-05-15 DIAGNOSIS — R112 Nausea with vomiting, unspecified: Secondary | ICD-10-CM | POA: Diagnosis not present

## 2017-05-15 DIAGNOSIS — Z79899 Other long term (current) drug therapy: Secondary | ICD-10-CM | POA: Diagnosis not present

## 2017-05-15 DIAGNOSIS — K2900 Acute gastritis without bleeding: Secondary | ICD-10-CM

## 2017-05-15 DIAGNOSIS — F17210 Nicotine dependence, cigarettes, uncomplicated: Secondary | ICD-10-CM | POA: Diagnosis not present

## 2017-05-15 LAB — URINALYSIS, COMPLETE (UACMP) WITH MICROSCOPIC
BILIRUBIN URINE: NEGATIVE
Bacteria, UA: NONE SEEN
GLUCOSE, UA: NEGATIVE mg/dL
Ketones, ur: NEGATIVE mg/dL
Leukocytes, UA: NEGATIVE
NITRITE: NEGATIVE
PROTEIN: NEGATIVE mg/dL
SPECIFIC GRAVITY, URINE: 1.015 (ref 1.005–1.030)
pH: 6 (ref 5.0–8.0)

## 2017-05-15 LAB — COMPREHENSIVE METABOLIC PANEL
ALT: 68 U/L — ABNORMAL HIGH (ref 17–63)
ANION GAP: 9 (ref 5–15)
AST: 24 U/L (ref 15–41)
Albumin: 3.9 g/dL (ref 3.5–5.0)
Alkaline Phosphatase: 53 U/L (ref 38–126)
BILIRUBIN TOTAL: 1.1 mg/dL (ref 0.3–1.2)
BUN: 36 mg/dL — ABNORMAL HIGH (ref 6–20)
CO2: 26 mmol/L (ref 22–32)
Calcium: 9 mg/dL (ref 8.9–10.3)
Chloride: 101 mmol/L (ref 101–111)
Creatinine, Ser: 2.03 mg/dL — ABNORMAL HIGH (ref 0.61–1.24)
GFR, EST AFRICAN AMERICAN: 48 mL/min — AB (ref >60)
GFR, EST NON AFRICAN AMERICAN: 41 mL/min — AB (ref >60)
Glucose, Bld: 105 mg/dL — ABNORMAL HIGH (ref 65–99)
POTASSIUM: 3.8 mmol/L (ref 3.5–5.1)
Sodium: 136 mmol/L (ref 135–145)
TOTAL PROTEIN: 7.6 g/dL (ref 6.5–8.1)

## 2017-05-15 LAB — CBC
HCT: 43.8 % (ref 40.0–52.0)
HEMOGLOBIN: 15.6 g/dL (ref 13.0–18.0)
MCH: 33.4 pg (ref 26.0–34.0)
MCHC: 35.6 g/dL (ref 32.0–36.0)
MCV: 93.7 fL (ref 80.0–100.0)
Platelets: 283 10*3/uL (ref 150–440)
RBC: 4.68 MIL/uL (ref 4.40–5.90)
RDW: 13.2 % (ref 11.5–14.5)
WBC: 13.4 10*3/uL — AB (ref 3.8–10.6)

## 2017-05-15 LAB — LIPASE, BLOOD: LIPASE: 23 U/L (ref 11–51)

## 2017-05-15 MED ORDER — GI COCKTAIL ~~LOC~~
30.0000 mL | ORAL | Status: AC
  Administered 2017-05-15: 30 mL via ORAL
  Filled 2017-05-15: qty 30

## 2017-05-15 MED ORDER — METOCLOPRAMIDE HCL 10 MG PO TABS: 10.0000 mg | ORAL_TABLET | Freq: Four times a day (QID) | ORAL | 0 refills | Status: DC | PRN

## 2017-05-15 MED ORDER — FAMOTIDINE 20 MG PO TABS
40.0000 mg | ORAL_TABLET | Freq: Once | ORAL | Status: AC
Start: 2017-05-15 — End: 2017-05-15
  Administered 2017-05-15: 40 mg via ORAL
  Filled 2017-05-15: qty 2

## 2017-05-15 MED ORDER — METOCLOPRAMIDE HCL 10 MG PO TABS
10.0000 mg | ORAL_TABLET | Freq: Once | ORAL | Status: AC
  Administered 2017-05-15: 10 mg via ORAL
  Filled 2017-05-15: qty 1

## 2017-05-15 MED ORDER — FAMOTIDINE 20 MG PO TABS: 20.0000 mg | ORAL_TABLET | Freq: Two times a day (BID) | ORAL | 0 refills | Status: DC

## 2017-05-15 MED ORDER — ALUMINUM-MAGNESIUM-SIMETHICONE 200-200-20 MG/5ML PO SUSP: 30.0000 mL | Freq: Three times a day (TID) | ORAL | 0 refills | Status: DC

## 2017-05-15 NOTE — ED Triage Notes (Signed)
Pt c/o nausea and vomiting that started yesterday. Denies fevers or diarrhea.  No abdominal pain.  Ambulatory to triage.  Pt wanted to get checked and make sure not infectious.  Has not been able to keep anything down.  Not diaphoretic. No vomiting in triage.

## 2017-05-15 NOTE — ED Provider Notes (Signed)
[facility] Emergency Department Provider Note  ____________________________________________  Time seen: Approximately 2:00 PM  I have reviewed the triage vital signs and the nursing notes.   HISTORY  Chief Complaint Emesis    HPI Nicholas PITTA is a 36 y.o. male who complains of nausea and vomiting for the past 24 hours. Worse at night.  worse when lying on his right side. No abdominal pain chest pain shortness of breath or back pain. No fevers chills or sweats. No diarrhea or constipation.  He does report that over the past week he was on a course of steroids as well as NSAIDs related to a low back injury. He also smokes every day, and eats greasy food from the [employer] where he works.  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 No recent IV drug use, avoids opioids. Very occasional alcohol or marijuana use. Trying to cut back on cigarettes.     Past Medical History:  Diagnosis Date  . Hepatitis C      There are no active problems to display for this patient.    History reviewed. No pertinent surgical history.   Prior to Admission medications   Medication Sig Start Date End Date Taking? Authorizing Provider  aluminum-magnesium hydroxide-simethicone (MAALOX) 200-200-20 MG/5ML SUSP Take 30 mLs by mouth 4 (four) times daily -  before meals and at bedtime. 05/15/17   Sharman Cheek, MD  brompheniramine-pseudoephedrine-DM 30-2-10 MG/5ML syrup Take 5 mLs by mouth 4 (four) times daily as needed. 10/01/16   Joni Reining, PA-C  clindamycin (CLEOCIN) 150 MG capsule Take 1 capsule (150 mg total) by mouth 4 (four) times daily. 07/08/16   Joni Reining, PA-C  famotidine (PEPCID) 20 MG tablet Take 1 tablet (20 mg total) by mouth 2 (two) times daily. 05/15/17   Sharman Cheek, MD  fexofenadine-pseudoephedrine (ALLEGRA-D) 60-120 MG 12 hr tablet Take 1 tablet by mouth 2 (two) times  daily. 07/08/16   Joni Reining, PA-C  ibuprofen (ADVIL,MOTRIN) 600 MG tablet Take 1 tablet (600 mg total) by mouth every 8 (eight) hours as needed. 10/01/16   Joni Reining, PA-C  metoCLOPramide (REGLAN) 10 MG tablet Take 1 tablet (10 mg total) by mouth every 6 (six) hours as needed. 05/15/17   Sharman Cheek, MD  naproxen (NAPROSYN) 500 MG tablet Take 1 tablet (500 mg total) by mouth 2 (two) times daily with a meal. 07/08/16   Joni Reining, PA-C  predniSONE (DELTASONE) 10 MG tablet Take 1 tablet (10 mg total) by mouth daily. 6,5,4,3,2,1 six day taper 05/05/17   Evon Slack, PA-C  promethazine (PHENERGAN) 25 MG tablet Take 1 tablet (25 mg total) by mouth every 8 (eight) hours as needed for nausea or vomiting. 10/01/16   Joni Reining, PA-C  traMADol (ULTRAM) 50 MG tablet Take 1 tablet (50 mg total) by mouth every 6 (six) hours as needed for moderate pain. 07/08/16   Joni Reining, PA-C     Allergies Penicillins and Zofran Frazier Richards hcl]   History reviewed. No pertinent family history.  Social History Social History  Substance Use Topics  . Smoking status: Current Every Day Smoker  . Smokeless tobacco: Never Used  . Alcohol use Yes    Review of Systems  Constitutional:   No fever or chills.  ENT:   No sore throat. No rhinorrhea. Cardiovascular:   No chest pain or syncope. Respiratory:  No dyspnea or cough. Gastrointestinal:   Negative for abdominal pain, positive vomiting. Musculoskeletal:   Negative for focal pain or swelling All other systems reviewed and are negative except as documented above in ROS and HPI.  ____________________________________________   PHYSICAL EXAM:  VITAL SIGNS: ED Triage Vitals  Enc Vitals Group     BP 05/15/17 1135 (!) 154/97     Pulse Rate 05/15/17 1135 (!) 57     Resp 05/15/17 1135 13     Temp 05/15/17 1135 97.8 F (36.6 C)     Temp Source 05/15/17 1135 Oral     SpO2 05/15/17 1135 100 %     Weight 05/15/17 1136 240 lb  (108.9 kg)     Height 05/15/17 1136 6' (1.829 m)     Head Circumference --      Peak Flow --      Pain Score --      Pain Loc --      Pain Edu? --      Excl. in GC? --     Vital signs reviewed, nursing assessments reviewed.   Constitutional:   Alert and oriented. Well appearing and in no distress. Eyes:   No scleral icterus.  EOMI. No nystagmus. No conjunctival pallor. PERRL. ENT   Head:   Normocephalic and atraumatic.   Nose:   No congestion/rhinnorhea.    Mouth/Throat:   MMM, no pharyngeal erythema. No peritonsillar mass.    Neck:   No meningismus. Full ROM. Hematological/Lymphatic/Immunilogical:   No cervical lymphadenopathy. Cardiovascular:   RRR. Symmetric bilateral radial and DP pulses.  No murmurs.  Respiratory:   Normal respiratory effort without tachypnea/retractions. Breath sounds are clear and equal bilaterally. No wheezes/rales/rhonchi. Gastrointestinal:   Soft and nontender. Non distended. There is no CVA tenderness.  No rebound, rigidity, or guarding. Genitourinary:   deferred Musculoskeletal:   Normal range of motion in all extremities. No joint effusions.  No lower extremity tenderness.  No edema. Neurologic:   Normal speech and language.  Motor grossly intact. No gross focal neurologic deficits are appreciated.  Skin:    Skin is warm, dry and intact. No rash noted.  No petechiae, purpura, or bullae.  ____________________________________________    LABS (pertinent positives/negatives) (all labs ordered are listed, but only abnormal results are displayed) Labs Reviewed  COMPREHENSIVE METABOLIC PANEL - Abnormal; Notable for the following:       Result Value   Glucose, Bld 105 (*)    BUN 36 (*)    Creatinine, Ser 2.03 (*)    ALT 68 (*)    GFR calc non Af Amer 41 (*)    GFR calc Af Amer 48 (*)    All other components within normal limits  CBC - Abnormal; Notable for the following:    WBC 13.4 (*)    All other components within normal limits   URINALYSIS, COMPLETE (UACMP) WITH MICROSCOPIC - Abnormal; Notable for the following:    Color, Urine YELLOW (*)    APPearance CLEAR (*)    Hgb urine dipstick MODERATE (*)    Squamous Epithelial / LPF 0-5 (*)    All other components within normal limits  LIPASE, BLOOD   ____________________________________________   EKG    ____________________________________________    RADIOLOGY  No results found.  ____________________________________________   PROCEDURES Procedures  ____________________________________________   DIFFERENTIAL DIAGNOSIS  GERD, gastritis, peptic ulcer disease, pancreatitis  CLINICAL IMPRESSION / ASSESSMENT AND PLAN / ED COURSE  Pertinent labs & imaging results that were  available during my care of the patient were reviewed by me and considered in my medical decision making (see chart for details).   patient well appearing no acute distress, unremarkable vital signs. History strongly consistent with gastritis related to steroid and NSAID side effect. This is likely worsened by his smoking as well and diet. Patient counseled on all of this, we'll start Reglan H2-blocker and Tums or Maalox for neutralization. I expect the patient will be improving rapidly within the next few days. Follow-up with primary care for continued management. If symptoms don't resolve, may need further evaluation for H. pylori , but at present time symptoms and exam do not suggest pancreatitis cholecystitis perforation or obstruction AAA or appendicitis. Medically stable for discharge home. No labs or imaging indicated at this time.     ____________________________________________   FINAL CLINICAL IMPRESSION(S) / ED DIAGNOSES    Final diagnoses:  Non-intractable vomiting with nausea, unspecified vomiting type  Acute gastritis without hemorrhage, unspecified gastritis type      New Prescriptions   ALUMINUM-MAGNESIUM HYDROXIDE-SIMETHICONE (MAALOX) 200-200-20 MG/5ML SUSP     Take 30 mLs by mouth 4 (four) times daily -  before meals and at bedtime.   FAMOTIDINE (PEPCID) 20 MG TABLET    Take 1 tablet (20 mg total) by mouth 2 (two) times daily.   METOCLOPRAMIDE (REGLAN) 10 MG TABLET    Take 1 tablet (10 mg total) by mouth every 6 (six) hours as needed.     Portions of this note were generated with dragon dictation software. Dictation errors may occur despite best attempts at proofreading.    Sharman Cheek, MD 05/15/17 [phone]

## 2017-05-15 NOTE — ED Notes (Signed)
Patient took off BP cuff and pulse ox. Patient texting on phone.

## 2017-12-08 ENCOUNTER — Other Ambulatory Visit: Payer: Self-pay

## 2017-12-08 ENCOUNTER — Emergency Department
Admission: EM | Admit: 2017-12-08 | Discharge: 2017-12-09 | Disposition: A | Discharge status: Home / Self Care | Payer: Medicaid Other | Attending: Emergency Medicine | Admitting: Emergency Medicine

## 2017-12-08 ENCOUNTER — Encounter: Payer: Self-pay | Admitting: *Deleted

## 2017-12-08 DIAGNOSIS — F17200 Nicotine dependence, unspecified, uncomplicated: Secondary | ICD-10-CM | POA: Insufficient documentation

## 2017-12-08 DIAGNOSIS — Z79899 Other long term (current) drug therapy: Secondary | ICD-10-CM | POA: Insufficient documentation

## 2017-12-08 DIAGNOSIS — F1010 Alcohol abuse, uncomplicated: Secondary | ICD-10-CM

## 2017-12-08 MED ORDER — LORAZEPAM 2 MG PO TABS
2.0000 mg | ORAL_TABLET | Freq: Once | ORAL | Status: AC
  Administered 2017-12-08: 2 mg via ORAL

## 2017-12-08 MED ORDER — LORAZEPAM 1 MG PO TABS
ORAL_TABLET | ORAL | Status: AC
  Filled 2017-12-08: qty 2

## 2017-12-08 NOTE — ED Provider Notes (Signed)
[facility] Emergency Department Provider Note   ____________________________________________   I have reviewed the triage vital signs and the nursing notes.   HISTORY  Chief Complaint Alcohol Problem   History limited by: Not Limited   HPI Nicholas Valdez is a 37 y.o. male who presents to the emergency department today because he would like to detox from alcohol.  The patient states he has been drinking about 1/5 of alcohol a day.  States it became worse a couple weeks ago when his grandmother died.  He now would like to try to detox.  States his last drink was roughly 12 hours ago.  Has been having some anxiety since then and tried taking some Ativan he will had leftover at home.  Now states he is out of Ativan.  He came to the emergency department for referral to [facility].   Per medical record review patient has a history of hepatitis C  Past Medical History:  Diagnosis Date  . Hepatitis C     There are no active problems to display for this patient.   History reviewed. No pertinent surgical history.  Prior to Admission medications   Medication Sig Start Date End Date Taking? Authorizing Provider  aluminum-magnesium hydroxide-simethicone (MAALOX) 200-200-20 MG/5ML SUSP Take 30 mLs by mouth 4 (four) times daily -  before meals and at bedtime. 05/15/17   Sharman Cheek, MD  brompheniramine-pseudoephedrine-DM 30-2-10 MG/5ML syrup Take 5 mLs by mouth 4 (four) times daily as needed. 10/01/16   Joni Reining, PA-C  clindamycin (CLEOCIN) 150 MG capsule Take 1 capsule (150 mg total) by mouth 4 (four) times daily. 07/08/16   Joni Reining, PA-C  famotidine (PEPCID) 20 MG tablet Take 1 tablet (20 mg total) by mouth 2 (two) times daily. 05/15/17   Sharman Cheek, MD  fexofenadine-pseudoephedrine (ALLEGRA-D) 60-120 MG 12 hr tablet Take 1 tablet by mouth 2 (two) times daily. 07/08/16   Joni Reining, PA-C  ibuprofen (ADVIL,MOTRIN) 600 MG tablet Take 1 tablet  (600 mg total) by mouth every 8 (eight) hours as needed. 10/01/16   Joni Reining, PA-C  metoCLOPramide (REGLAN) 10 MG tablet Take 1 tablet (10 mg total) by mouth every 6 (six) hours as needed. 05/15/17   Sharman Cheek, MD  naproxen (NAPROSYN) 500 MG tablet Take 1 tablet (500 mg total) by mouth 2 (two) times daily with a meal. 07/08/16   Joni Reining, PA-C  predniSONE (DELTASONE) 10 MG tablet Take 1 tablet (10 mg total) by mouth daily. 6,5,4,3,2,1 six day taper 05/05/17   Evon Slack, PA-C  promethazine (PHENERGAN) 25 MG tablet Take 1 tablet (25 mg total) by mouth every 8 (eight) hours as needed for nausea or vomiting. 10/01/16   Joni Reining, PA-C  traMADol (ULTRAM) 50 MG tablet Take 1 tablet (50 mg total) by mouth every 6 (six) hours as needed for moderate pain. 07/08/16   Joni Reining, PA-C    Allergies Penicillins and Zofran Frazier Richards hcl]  No family history on file.  Social History Social History   Tobacco Use  . Smoking status: Current Every Day Smoker  . Smokeless tobacco: Never Used  Substance Use Topics  . Alcohol use: Yes  . Drug use: Yes    Comment: oxycodone-- not prescribed    Review of Systems Constitutional: No fever/chills Eyes: No visual changes. ENT: No sore throat. Cardiovascular: Denies chest pain. Respiratory: Denies shortness of breath. Gastrointestinal: No abdominal pain.  No nausea, no vomiting.  No diarrhea.   Genitourinary: Negative for dysuria. Musculoskeletal: Negative for back pain. Skin: Negative for rash. Neurological: Negative for headaches, focal weakness or numbness.  ____________________________________________   PHYSICAL EXAM:  VITAL SIGNS: ED Triage Vitals  Enc Vitals Group     BP 12/08/17 2054 (!) 154/89     Pulse Rate 12/08/17 2054 (!) 134     Resp 12/08/17 2054 18     Temp 12/08/17 2054 98.6 F (37 C)     Temp Source 12/08/17 2054 Oral     SpO2 12/08/17 2054 99 %     Weight --      Height --      Head  Circumference --      Peak Flow --      Pain Score 12/08/17 2055 0   Constitutional: Alert and oriented. Slightly anxious Eyes: Conjunctivae are normal.  ENT   Head: Normocephalic and atraumatic.   Nose: No congestion/rhinnorhea.   Mouth/Throat: Mucous membranes are moist.   Neck: No stridor. Hematological/Lymphatic/Immunilogical: No cervical lymphadenopathy. Cardiovascular: Tachycardic, regular rhythm.  No murmurs, rubs, or gallops. Respiratory: Normal respiratory effort without tachypnea nor retractions. Breath sounds are clear and equal bilaterally. No wheezes/rales/rhonchi. Gastrointestinal: Soft and non tender. No rebound. No guarding.  Genitourinary: Deferred Musculoskeletal: Normal range of motion in all extremities. No lower extremity edema. Neurologic:  Normal speech and language. No gross focal neurologic deficits are appreciated.  Skin:  Skin is warm, dry and intact. No rash noted. Psychiatric: Anxious  ____________________________________________   EKG  None  ____________________________________________    RADIOLOGY  None  ____________________________________________   PROCEDURES  Procedures  ____________________________________________   INITIAL IMPRESSION / ASSESSMENT AND PLAN / ED COURSE  Pertinent labs & imaging results that were available during my care of the patient were reviewed by me and considered in my medical decision making (see chart for details).  Patient presented to the emergency department today requesting detox from alcohol.  Slightly anxious here however no other acute distress.  Do think patient would benefit from inpatient alcohol detox.  Will consult behavioral health specialist for placement.   ____________________________________________   FINAL CLINICAL IMPRESSION(S) / ED DIAGNOSES  Final diagnoses:  Alcohol abuse     Note: This dictation was prepared with Dragon dictation. Any transcriptional errors that  result from this process are unintentional     Phineas Semen, MD 12/08/17 2325

## 2017-12-08 NOTE — ED Notes (Signed)
Pt requesting etoh detox.  Last etoh was this am.  Pt took ativan this afternoon.  States grandmother passed 3 months ago and and pt has continued to drink more.  Pt also smoking marijuana.  Denies SI or HI.  Pt alert.  Hx opiate abuse

## 2017-12-08 NOTE — ED Triage Notes (Signed)
Pt requesting ETOH detox. Last drink was around 10am today. Pt does state he took Ativan 1mg  around 1500, helped initially but starting to feel anxious again.

## 2017-12-08 NOTE — ED Notes (Signed)
Report off to ann c rn 

## 2017-12-09 LAB — BASIC METABOLIC PANEL
Anion gap: 10 (ref 5–15)
BUN: 9 mg/dL (ref 6–20)
CHLORIDE: 103 mmol/L (ref 101–111)
CO2: 25 mmol/L (ref 22–32)
Calcium: 9 mg/dL (ref 8.9–10.3)
Creatinine, Ser: 0.89 mg/dL (ref 0.61–1.24)
GFR calc Af Amer: >60 mL/min (ref >60)
GFR calc non Af Amer: >60 mL/min (ref >60)
Glucose, Bld: 100 mg/dL — ABNORMAL HIGH (ref 65–99)
POTASSIUM: 3.6 mmol/L (ref 3.5–5.1)
SODIUM: 138 mmol/L (ref 135–145)

## 2017-12-09 LAB — CBC WITH DIFFERENTIAL/PLATELET
BASOS ABS: 0.1 10*3/uL (ref 0–0.1)
Basophils Relative: 1 %
Eosinophils Absolute: 0 10*3/uL (ref 0–0.7)
Eosinophils Relative: 0 %
HEMATOCRIT: 45.9 % (ref 40.0–52.0)
HEMOGLOBIN: 16.6 g/dL (ref 13.0–18.0)
LYMPHS PCT: 17 %
Lymphs Abs: 2 10*3/uL (ref 1.0–3.6)
MCH: 34.4 pg — ABNORMAL HIGH (ref 26.0–34.0)
MCHC: 36.1 g/dL — ABNORMAL HIGH (ref 32.0–36.0)
MCV: 95.3 fL (ref 80.0–100.0)
Monocytes Absolute: 0.6 10*3/uL (ref 0.2–1.0)
Monocytes Relative: 5 %
NEUTROS ABS: 9.1 10*3/uL — AB (ref 1.4–6.5)
NEUTROS PCT: 77 %
Platelets: 242 10*3/uL (ref 150–440)
RBC: 4.81 MIL/uL (ref 4.40–5.90)
RDW: 13.3 % (ref 11.5–14.5)
WBC: 11.9 10*3/uL — AB (ref 3.8–10.6)

## 2017-12-09 LAB — ETHANOL: Alcohol, Ethyl (B): 46 mg/dL — ABNORMAL HIGH (ref <10)

## 2017-12-09 MED ORDER — CHLORDIAZEPOXIDE HCL 25 MG PO CAPS
25.0000 mg | ORAL_CAPSULE | Freq: Once | ORAL | Status: AC
  Administered 2017-12-09: 25 mg via ORAL
  Filled 2017-12-09: qty 1

## 2017-12-09 MED ORDER — CHLORDIAZEPOXIDE HCL 25 MG PO CAPS: ORAL_CAPSULE | ORAL | 0 refills | Status: DC

## 2017-12-09 NOTE — ED Provider Notes (Addendum)
-----------------------------------------   3:58 AM on 12/09/2017 -----------------------------------------   Blood pressure 128/85, pulse 92, temperature 98.6 F (37 C), temperature source Oral, resp. rate 18, SpO2 99 %.  Assuming care from Dr. Derrill Kay.  In short, Nicholas Valdez is a 37 y.o. male with a chief complaint of Alcohol Problem .  Refer to the original H&P for additional details.  The current plan of care is to await [facility] information regarding acceptance to a detox facility.  The patient did have some blood work drawn and his CBC and BMP are unremarkable.  His ethanol is 46.  We were trying to get the patient over to [facility] but after some time in the emergency department the patient decided he no longer wanted to stay.  He reports that he was told by Dr. Derrill Kay that he could receive a prescription for Librium taper if he wanted to detox at home.  The patient states that he has a safe environment and he would like to detox at home.  I will give the patient a dose of Librium in the emergency department he will be discharged. We will reassess the patient's vital signs.        Rebecka Apley, MD 12/09/17 0401    Rebecka Apley, MD 12/09/17 [phone]

## 2017-12-09 NOTE — ED Notes (Signed)
Pt here for detox from ETOH. Pt is calm and cooperative at this time. Updated on plan of care.

## 2017-12-09 NOTE — ED Notes (Signed)
Pt up to bathroom. No co's at this time.

## 2017-12-09 NOTE — ED Notes (Signed)
Referral information has been faxed to [facility] (Fax - [phone])

## 2017-12-09 NOTE — Discharge Instructions (Signed)
Please follow up with [facility] or [facility] regarding your alcohol withdrawal. Please return with any concerns.

## 2017-12-09 NOTE — ED Notes (Signed)
Dr Zenda Alpers informed that pt is wishing to go home and is asking for medication to help him detox at home.

## 2018-06-23 ENCOUNTER — Other Ambulatory Visit: Payer: Self-pay

## 2018-06-23 ENCOUNTER — Emergency Department
Admission: EM | Admit: 2018-06-23 | Discharge: 2018-06-23 | Disposition: A | Discharge status: Home / Self Care | Payer: Medicaid Other | Attending: Emergency Medicine | Admitting: Emergency Medicine

## 2018-06-23 ENCOUNTER — Encounter: Payer: Self-pay | Admitting: Emergency Medicine

## 2018-06-23 DIAGNOSIS — R079 Chest pain, unspecified: Secondary | ICD-10-CM | POA: Insufficient documentation

## 2018-06-23 DIAGNOSIS — R0789 Other chest pain: Secondary | ICD-10-CM

## 2018-06-23 DIAGNOSIS — Z79899 Other long term (current) drug therapy: Secondary | ICD-10-CM | POA: Insufficient documentation

## 2018-06-23 DIAGNOSIS — F17200 Nicotine dependence, unspecified, uncomplicated: Secondary | ICD-10-CM | POA: Insufficient documentation
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
## 2018-08-07 ENCOUNTER — Other Ambulatory Visit: Payer: Self-pay

## 2018-08-07 ENCOUNTER — Emergency Department
Admission: EM | Admit: 2018-08-07 | Discharge: 2018-08-07 | Disposition: A | Discharge status: Home / Self Care | Payer: Medicaid Other | Attending: Emergency Medicine | Admitting: Emergency Medicine

## 2018-08-07 ENCOUNTER — Encounter: Payer: Self-pay | Admitting: Emergency Medicine

## 2018-08-07 DIAGNOSIS — F17200 Nicotine dependence, unspecified, uncomplicated: Secondary | ICD-10-CM | POA: Insufficient documentation

## 2018-08-07 DIAGNOSIS — K029 Dental caries, unspecified: Secondary | ICD-10-CM | POA: Insufficient documentation

## 2018-08-07 DIAGNOSIS — F1210 Cannabis abuse, uncomplicated: Secondary | ICD-10-CM | POA: Insufficient documentation

## 2018-08-07 MED ORDER — CLINDAMYCIN HCL 150 MG PO CAPS: ORAL_CAPSULE | ORAL | 0 refills | Status: DC

## 2018-08-07 MED ORDER — IBUPROFEN 600 MG PO TABS: 600.0000 mg | ORAL_TABLET | Freq: Three times a day (TID) | ORAL | 0 refills | Status: DC | PRN

## 2018-08-07 NOTE — Discharge Instructions (Signed)
Begin taking medication as directed.  Call 1 of the dental clinics listed on your discharge papers and make arrangements to be seen.  Also the clinic at [facility] has walk-in hours.  OPTIONS FOR DENTAL FOLLOW UP CARE  [place] [facility] and [facility] Services - Local Safety Net Dental Clinics [email or web address]   [facility] [phone])  Sharl Ma [phone])  [place] [phone])  [facility] [phone])  [facility] [phone]) This clinic caters to the indigent population and is on a lottery system. Location: [employer] [facility], [employer], [address] [place] Clinic Hours: Wednesdays from 6pm - 9pm, patients seen by a lottery system. For dates, call or go to [email or web address] Services: Cleanings, fillings and simple extractions. Payment Options: DENTAL WORK IS FREE OF CHARGE. Bring proof of income or support. Best way to get seen: Arrive at 5:15 pm - this is a lottery, NOT first come/first serve, so arriving earlier will not increase your chances of being seen.     [facility] [phone] Select option 1 for emergencies   Location: [facility], [place], [address] [place] Clinic Hours: No walk-ins accepted - call the day before to schedule an appointment. Check in times are 9:30 am and 1:30 pm. Services: Simple extractions, temporary fillings, pulpectomy/pulp debridement, uncomplicated abscess drainage. Payment Options: PAYMENT IS DUE AT THE TIME OF SERVICE.  Fee is usually $100-200, additional surgical procedures (e.g. abscess drainage) may be extra. Cash, checks, Visa/MasterCard accepted.  Can file Medicaid if patient is covered for dental - patient should call case worker to check. No discount for [facility] patients. Best way  to get seen: MUST call the day before and get onto the schedule. Can usually be seen the next 1-2 days. No walk-ins accepted.     [facility] Dental [facility] [phone]   Location: [facility], [address], [place] Clinic Hours: M, W, Th, F 8am or 1:30pm, Tues 9a or 1:30 - first come/first served. Services: Simple extractions, temporary fillings, uncomplicated abscess drainage.  You do not need to be an [facility] resident. Payment Options: PAYMENT IS DUE AT THE TIME OF SERVICE. Dental insurance, otherwise sliding scale - bring proof of income or support. Depending on income and treatment needed, cost is usually $50-200. Best way to get seen: Arrive early as it is first come/first served.     [facility] [phone]   Location: [address] Clinic Hours: Mon-Thu 8a-5p Services: Most basic dental services including extractions and fillings. Payment Options: PAYMENT IS DUE AT THE TIME OF SERVICE. Sliding scale, up to 50% off - bring proof if income or support. Medicaid with dental option accepted. Best way to get seen: Call to schedule an appointment, can usually be seen within 2 weeks OR they will try to see walk-ins - show up at 8a or 2p (you may have to wait).     [facility] [phone] [place] RESIDENTS ONLY   Location: [facility], [address] [place], [place] [address] Clinic Hours: By appointment only. Monday - Thursday 8am-5pm, Friday 8am-12pm Services: Cleanings, fillings, extractions. Payment Options: PAYMENT IS DUE AT THE TIME OF SERVICE. Cash, Visa or MasterCard. Sliding scale - $30 minimum per service. Best way to get seen: Come in to office, complete packet and make an appointment - need proof of income or support monies  for each household member and proof of [facility] residence. Usually takes about a month to get in.      [facility] [phone]   Location: [address], [place] Clinic Hours: Walk-in Urgent Care Dental Services are offered Monday-Friday mornings only. The numbers of emergencies accepted daily is limited to the number of providers available. Maximum 15 - Mondays, Wednesdays & Thursdays Maximum 10 - Tuesdays & Fridays Services: You do not need to be a [facility] resident to be seen for a dental emergency. Emergencies are defined as pain, swelling, abnormal bleeding, or dental trauma. Walkins will receive x-rays if needed. NOTE: Dental cleaning is not an emergency. Payment Options: PAYMENT IS DUE AT THE TIME OF SERVICE. Minimum co-pay is $40.00 for uninsured patients. Minimum co-pay is $3.00 for Medicaid with dental coverage. Dental Insurance is accepted and must be presented at time of visit. Medicare does not cover dental. Forms of payment: Cash, credit card, checks. Best way to get seen: If not previously registered with the clinic, walk-in dental registration begins at 7:15 am and is on a first come/first serve basis. If previously registered with the clinic, call to make an appointment.     [facility] [place] ONLY   Location: [address] [place], [place] Clinic Hours: Mon-Thu 10a-2p Services: Extractions only! Payment Options: FREE (donations accepted) - bring proof of income or support Best way to get seen: Call and schedule an appointment OR come at 8am on the 1st Monday of every month (except for holidays) when it is first come/first served.     [facility] [phone]   Location: [place], [place] Clinic Hours: Friday mornings Services, Payment Options, Best way to get seen: Call for info

## 2018-08-07 NOTE — ED Triage Notes (Signed)
Patient complaining of pain left side of face, states pain started 36 hrs ago.  States he has pain upper left jaw "about 4 in a row".  Swelling noted left cheek.  States he has been alternating Tylenol and Ibuprofen without relief.

## 2018-08-07 NOTE — ED Provider Notes (Signed)
[facility] Emergency Department Provider Note   ____________________________________________   First MD Initiated Contact with Patient 08/07/18 [phone]     (approximate)  I have reviewed the triage vital signs and the nursing notes.   HISTORY  Chief Complaint Dental Pain   HPI Nicholas Valdez is a 38 y.o. male presents to the ED with complaint of left upper dental pain.  Patient states that it began approximately 36 hours ago.  Patient has history of multiple dental cavities which he has not seen a dentist for.  He has been alternating Tylenol and ibuprofen without any relief.  He admits to drinking alcohol last evening which also did not kill the pain.  He denies any fever or chills.  Patient continues to smoke.  He rates his pain as an 8 out of 10.   Past Medical History:  Diagnosis Date  . Hepatitis C     There are no active problems to display for this patient.   History reviewed. No pertinent surgical history.  Prior to Admission medications   Medication Sig Start Date End Date Taking? Authorizing Provider  clindamycin (CLEOCIN) 150 MG capsule 2 tablets tid 08/07/18   Bridget Hartshorn L, PA-C  ibuprofen (ADVIL,MOTRIN) 600 MG tablet Take 1 tablet (600 mg total) by mouth every 8 (eight) hours as needed. 08/07/18   Tommi Rumps, PA-C    Allergies Penicillins and Zofran [ondansetron hcl]  No family history on file.  Social History Social History   Tobacco Use  . Smoking status: Current Every Day Smoker  . Smokeless tobacco: Never Used  Substance Use Topics  . Alcohol use: Yes  . Drug use: Yes    Types: Marijuana    Comment: oxycodone-- not prescribed    Review of Systems Constitutional: No fever/chills Eyes: No visual changes. ENT: No sore throat.  Positive for dental pain. Cardiovascular: Denies chest pain. Respiratory: Denies shortness of breath. Musculoskeletal: Negative for muscle aches. Skin: Negative for  rash. Neurological: Negative for headaches ____________________________________________   PHYSICAL EXAM:  VITAL SIGNS: ED Triage Vitals  Enc Vitals Group     BP 08/07/18 0841 (!) 148/93     Pulse Rate 08/07/18 0841 (!) 104     Resp --      Temp 08/07/18 0841 98.4 F (36.9 C)     Temp Source 08/07/18 0841 Oral     SpO2 08/07/18 0841 97 %     Weight 08/07/18 0841 250 lb (113.4 kg)     Height 08/07/18 0841 5\' 10"  (1.778 m)     Head Circumference --      Peak Flow --      Pain Score 08/07/18 0847 8     Pain Loc --      Pain Edu? --      Excl. in GC? --     Constitutional: Alert and oriented. Well appearing and in no acute distress. Eyes: Conjunctivae are normal.  Head: Atraumatic. Nose: No congestion/rhinnorhea. Mouth/Throat: Mucous membranes are moist.  Oropharynx non-erythematous.  Left upper premolar and molars are in extremely poor dental repair with most only visible at the gumline from breaking off.  No active drainage present.  Gums are edematous. Neck: No stridor.   Hematological/Lymphatic/Immunilogical: No cervical lymphadenopathy. Cardiovascular: Normal rate, regular rhythm. Grossly normal heart sounds.  Good peripheral circulation. Respiratory: Normal respiratory effort.  No retractions. Lungs CTAB. Musculoskeletal: Moves upper and lower extremities without any difficulty. Neurologic:  Normal speech and language. No gross focal  neurologic deficits are appreciated. No gait instability. Skin:  Skin is warm, dry and intact. No rash noted. Psychiatric: Mood and affect are normal. Speech and behavior are normal.  ____________________________________________   LABS (all labs ordered are listed, but only abnormal results are displayed)  Labs Reviewed - No data to display  PROCEDURES  Procedure(s) performed: None  Procedures  Critical Care performed: No  ____________________________________________   INITIAL IMPRESSION / ASSESSMENT AND PLAN / ED COURSE  As  part of my medical decision making, I reviewed the following data within the electronic MEDICAL RECORD NUMBER Notes from prior ED visits and [place] Controlled Substance Database  Patient presents with complaint of dental pain.  Patient has very poor dental hygiene and repair.  He was given list of dental clinics to follow-up with.  Patient was discharged with prescription for clindamycin and ibuprofen every 8 hours as needed for pain.  He is strongly encouraged to follow-up with dental clinic and also made aware that the clinic at [facility] has walk-in hours.  ____________________________________________   FINAL CLINICAL IMPRESSION(S) / ED DIAGNOSES  Final diagnoses:  Pain due to dental caries     ED Discharge Orders         Ordered    clindamycin (CLEOCIN) 150 MG capsule     08/07/18 0920    ibuprofen (ADVIL,MOTRIN) 600 MG tablet  Every 8 hours PRN     08/07/18 0920           Note:  This document was prepared using Dragon voice recognition software and may include unintentional dictation errors.    Tommi Rumps, PA-C 08/07/18 0945    Arnaldo Natal, MD 08/07/18 [phone]

## 2018-09-10 ENCOUNTER — Encounter: Payer: Self-pay | Admitting: Emergency Medicine

## 2018-09-10 DIAGNOSIS — Z5321 Procedure and treatment not carried out due to patient leaving prior to being seen by health care provider: Secondary | ICD-10-CM | POA: Insufficient documentation

## 2018-09-10 DIAGNOSIS — R509 Fever, unspecified: Secondary | ICD-10-CM | POA: Insufficient documentation

## 2018-09-10 LAB — CBC WITH DIFFERENTIAL/PLATELET
ABS IMMATURE GRANULOCYTES: 0.03 10*3/uL (ref 0.00–0.07)
BASOS ABS: 0.1 10*3/uL (ref 0.0–0.1)
Basophils Relative: 1 %
Eosinophils Absolute: 0.2 10*3/uL (ref 0.0–0.5)
Eosinophils Relative: 3 %
HCT: 46.5 % (ref 39.0–52.0)
Hemoglobin: 16.7 g/dL (ref 13.0–17.0)
IMMATURE GRANULOCYTES: 0 %
Lymphocytes Relative: 27 %
Lymphs Abs: 2.1 10*3/uL (ref 0.7–4.0)
MCH: 33.9 pg (ref 26.0–34.0)
MCHC: 35.9 g/dL (ref 30.0–36.0)
MCV: 94.5 fL (ref 80.0–100.0)
MONOS PCT: 8 %
Monocytes Absolute: 0.7 10*3/uL (ref 0.1–1.0)
NEUTROS ABS: 4.8 10*3/uL (ref 1.7–7.7)
NEUTROS PCT: 61 %
NRBC: 0 % (ref 0.0–0.2)
Platelets: 288 10*3/uL (ref 150–400)
RBC: 4.92 MIL/uL (ref 4.22–5.81)
RDW: 12.2 % (ref 11.5–15.5)
WBC: 7.9 10*3/uL (ref 4.0–10.5)

## 2018-09-10 LAB — COMPREHENSIVE METABOLIC PANEL
ALBUMIN: 4.4 g/dL (ref 3.5–5.0)
ALT: 49 U/L — ABNORMAL HIGH (ref 0–44)
AST: 51 U/L — AB (ref 15–41)
Alkaline Phosphatase: 63 U/L (ref 38–126)
Anion gap: 10 (ref 5–15)
BUN: 17 mg/dL (ref 6–20)
CHLORIDE: 105 mmol/L (ref 98–111)
CO2: 24 mmol/L (ref 22–32)
Calcium: 9 mg/dL (ref 8.9–10.3)
Creatinine, Ser: 1.04 mg/dL (ref 0.61–1.24)
GFR calc Af Amer: >60 mL/min (ref >60)
GLUCOSE: 107 mg/dL — AB (ref 70–99)
Potassium: 3.9 mmol/L (ref 3.5–5.1)
Sodium: 139 mmol/L (ref 135–145)
TOTAL PROTEIN: 8 g/dL (ref 6.5–8.1)
Total Bilirubin: 1.3 mg/dL — ABNORMAL HIGH (ref 0.3–1.2)

## 2018-09-10 LAB — LIPASE, BLOOD: LIPASE: 25 U/L (ref 11–51)

## 2018-09-10 LAB — INFLUENZA PANEL BY PCR (TYPE A & B)
INFLBPCR: NEGATIVE
Influenza A By PCR: NEGATIVE

## 2018-09-10 NOTE — ED Triage Notes (Signed)
Pt c/o cough, fatigue, fever, N/V x4 days. Pt last took tylenol at 1900.

## 2018-09-11 ENCOUNTER — Emergency Department
Admission: EM | Admit: 2018-09-11 | Discharge: 2018-09-11 | Disposition: A | Discharge status: Home / Self Care | Payer: Medicaid Other | Attending: Emergency Medicine | Admitting: Emergency Medicine

## 2018-09-11 NOTE — ED Notes (Signed)
No answer when called several times from lobby 

## 2018-09-14 ENCOUNTER — Telehealth: Payer: Self-pay | Admitting: Emergency Medicine

## 2018-09-14 NOTE — Telephone Encounter (Signed)
Called patient due to lwot to inquire about condition and follow up plans. Left message.   

## 2018-09-17 ENCOUNTER — Emergency Department
Admission: EM | Admit: 2018-09-17 | Discharge: 2018-09-17 | Disposition: A | Discharge status: Home / Self Care | Payer: Medicaid Other | Attending: Emergency Medicine | Admitting: Emergency Medicine

## 2018-09-17 ENCOUNTER — Other Ambulatory Visit: Payer: Self-pay

## 2018-09-17 ENCOUNTER — Encounter: Payer: Self-pay | Admitting: Emergency Medicine

## 2018-09-17 DIAGNOSIS — H66001 Acute suppurative otitis media without spontaneous rupture of ear drum, right ear: Secondary | ICD-10-CM | POA: Insufficient documentation

## 2018-09-17 DIAGNOSIS — F17210 Nicotine dependence, cigarettes, uncomplicated: Secondary | ICD-10-CM | POA: Insufficient documentation

## 2018-09-17 DIAGNOSIS — B029 Zoster without complications: Secondary | ICD-10-CM

## 2018-09-17 MED ORDER — AZITHROMYCIN 250 MG PO TABS: ORAL_TABLET | ORAL | 0 refills | Status: DC

## 2018-09-17 MED ORDER — ACYCLOVIR 400 MG PO TABS: 800.0000 mg | ORAL_TABLET | Freq: Every day | ORAL | 0 refills | Status: AC

## 2018-09-17 MED ORDER — PREDNISONE 10 MG PO TABS: ORAL_TABLET | ORAL | 0 refills | Status: DC

## 2018-09-17 MED ORDER — OXYCODONE-ACETAMINOPHEN 5-325 MG PO TABS: 1.0000 | ORAL_TABLET | Freq: Four times a day (QID) | ORAL | 0 refills | Status: DC | PRN

## 2018-09-17 NOTE — Discharge Instructions (Signed)
Follow-up with Dr. [facility] choice[facility] or [facility] acute care if any continued problems. Begin taking acyclovir as directed for 7 days.  Percocet as needed for pain as directed.  Prednisone begin taking today and Zithromax for your ear infection.  Do not take ibuprofen with this medication.  Percocet contains Tylenol in the medication and you should not take extra.  Return to the emergency room if any severe worsening of your symptoms or urgent concerns.

## 2018-09-17 NOTE — ED Notes (Signed)
Refer to triage: pt states "running a fever at home". Pt presents to ED afebrile, denies taking medication at home. NAD noted at this time.

## 2018-09-17 NOTE — ED Provider Notes (Signed)
[facility] Emergency Department Provider Note  ____________________________________________   First MD Initiated Contact with Patient 09/17/18 [phone]     (approximate)  I have reviewed the triage vital signs and the nursing notes.   HISTORY  Chief Complaint Otalgia and Rash   HPI Nicholas Valdez is a 38 y.o. male   presents to the ED with complaint of right ear pain for the last few days.  Patient states that he was in the ED to have his ear looked at the left before being seen several days ago because of the wait time.  Patient since that time has developed a rash on the right side of his face that itches and burns.  He denies any new medicines, soaps or lotions.  Patient is unaware of any fever.  He states the pain in his right ear feels like bubbles.  He rates his pain as an 8 out of 10.   Past Medical History:  Diagnosis Date  . Hepatitis C     There are no active problems to display for this patient.   History reviewed. No pertinent surgical history.  Prior to Admission medications   Medication Sig Start Date End Date Taking? Authorizing Provider  acyclovir (ZOVIRAX) 400 MG tablet Take 2 tablets (800 mg total) by mouth 5 (five) times daily for 7 days. 09/17/18 09/24/18  Tommi Rumps, PA-C  azithromycin (ZITHROMAX Z-PAK) 250 MG tablet Take 2 tablets (500 mg) on  Day 1,  followed by 1 tablet (250 mg) once daily on Days 2 through 5. 09/17/18   Tommi Rumps, PA-C  clindamycin (CLEOCIN) 150 MG capsule 2 tablets tid 08/07/18   Tommi Rumps, PA-C  ibuprofen (ADVIL,MOTRIN) 600 MG tablet Take 1 tablet (600 mg total) by mouth every 8 (eight) hours as needed. 08/07/18   Tommi Rumps, PA-C  oxyCODONE-acetaminophen (PERCOCET) 5-325 MG tablet Take 1 tablet by mouth every 6 (six) hours as needed for severe pain. 09/17/18   Tommi Rumps, PA-C  predniSONE (DELTASONE) 10 MG tablet Take 6 tablets  today, on day 2 take 5 tablets, day 3 take 4 tablets,  day 4 take 3 tablets, day 5 take  2 tablets and 1 tablet the last day 09/17/18   Tommi Rumps, PA-C    Allergies Penicillins and Zofran [ondansetron hcl]  No family history on file.  Social History Social History   Tobacco Use  . Smoking status: Current Every Day Smoker  . Smokeless tobacco: Never Used  Substance Use Topics  . Alcohol use: Yes  . Drug use: Yes    Types: Marijuana    Comment: oxycodone-- not prescribed    Review of Systems Constitutional: No fever/chills Eyes: No visual changes. ENT: No sore throat.  Right ear pain. Cardiovascular: Denies chest pain. Respiratory: Denies shortness of breath. Gastrointestinal: No abdominal pain.  No nausea, no vomiting. Musculoskeletal: Negative for back pain. Skin: Positive for rash. Neurological: Negative for headaches, focal weakness or numbness. ___________________________________________   PHYSICAL EXAM:  VITAL SIGNS: ED Triage Vitals [09/17/18 0721]  Enc Vitals Group     BP (!) 143/95     Pulse Rate (!) 101     Resp 18     Temp 98.8 F (37.1 C)     Temp Source Oral     SpO2 99 %     Weight 240 lb (108.9 kg)     Height 6' (1.829 m)     Head Circumference  Peak Flow      Pain Score 8     Pain Loc      Pain Edu?      Excl. in GC?     Constitutional: Alert and oriented. Well appearing and in no acute distress. Eyes: Conjunctivae are normal.  Head: Atraumatic. Nose: No congestion/rhinnorhea.  Left EAC and TM are clear.  Right EAC is clear however TM is erythematous especially superiorly on the outer rim with poor light reflex present. Mouth/Throat: Mucous membranes are moist.  Oropharynx non-erythematous. Neck: No stridor.   Hematological/Lymphatic/Immunilogical: No cervical lymphadenopathy. Cardiovascular: Normal rate, regular rhythm. Grossly normal heart sounds.  Good peripheral circulation. Respiratory: Normal respiratory effort.  No retractions. Lungs CTAB. Musculoskeletal: Moves upper lower  extremities without any difficulty.  Normal gait was noted. Neurologic:  Normal speech and language. No gross focal neurologic deficits are appreciated. No gait instability. Skin:  Skin is warm, dry.  Right lateral face from the right tragus to the chin area there is an erythematous rash with clusters of vesicles that are currently intact on erythematous base.  No pustules are noted.  No rashes noted to the left side of the face.  Rash is consistent with herpes zoster. Psychiatric: Mood and affect are normal. Speech and behavior are normal.  ____________________________________________   LABS (all labs ordered are listed, but only abnormal results are displayed)  Labs Reviewed - No data to display   PROCEDURES  Procedure(s) performed: None  Procedures  Critical Care performed: No  ____________________________________________   INITIAL IMPRESSION / ASSESSMENT AND PLAN / ED COURSE  As part of my medical decision making, I reviewed the following data within the electronic MEDICAL RECORD NUMBER Notes from prior ED visits and [place] Controlled Substance Database  Patient presents to the ED with complaint of right ear pain for several days and then the beginning of a rash that started recently.  Patient states the rash is painful and itching at same time.  Physical exam showed a right otitis media and herpes zoster involving the right face up to the right ear.  Patient was started on prednisone tapering dose, acyclovir 800 mg x 5/day for 7 days, Zithromax and Percocet.  Patient is to follow-up with his PCP or can no clinic acute care if any continued problems.  ____________________________________________   FINAL CLINICAL IMPRESSION(S) / ED DIAGNOSES  Final diagnoses:  Herpes zoster without complication  Non-recurrent acute suppurative otitis media of right ear without spontaneous rupture of tympanic membrane     ED Discharge Orders         Ordered    predniSONE (DELTASONE) 10 MG tablet      09/17/18 0752    oxyCODONE-acetaminophen (PERCOCET) 5-325 MG tablet  Every 6 hours PRN     09/17/18 0752    acyclovir (ZOVIRAX) 400 MG tablet  5 times daily     09/17/18 0752    azithromycin (ZITHROMAX Z-PAK) 250 MG tablet     09/17/18 0762           Note:  This document was prepared using Dragon voice recognition software and may include unintentional dictation errors.    Tommi Rumps, PA-C 09/17/18 2633    Nita Sickle, MD 09/20/18 [phone]

## 2018-09-17 NOTE — ED Triage Notes (Signed)
Pt states right ear pain for the past few days, and rash to right side of face, no new meds or soap, rash does itch but mostly burns per pt.

## 2018-09-17 NOTE — ED Notes (Signed)
First Nurse Note: Patient complaining of rash and ear pain, states he was here recently but left without being seen.  NAD.

## 2018-09-24 ENCOUNTER — Emergency Department
Admission: EM | Admit: 2018-09-24 | Discharge: 2018-09-24 | Discharge status: Against Medical Advice | Payer: Medicaid Other

## 2018-09-28 ENCOUNTER — Other Ambulatory Visit: Payer: Self-pay

## 2018-09-28 ENCOUNTER — Emergency Department
Admission: EM | Admit: 2018-09-28 | Discharge: 2018-09-28 | Disposition: A | Discharge status: Home / Self Care | Payer: Medicaid Other | Attending: Emergency Medicine | Admitting: Emergency Medicine

## 2018-09-28 ENCOUNTER — Encounter: Payer: Self-pay | Admitting: Emergency Medicine

## 2018-09-28 DIAGNOSIS — H66001 Acute suppurative otitis media without spontaneous rupture of ear drum, right ear: Secondary | ICD-10-CM | POA: Insufficient documentation

## 2018-09-28 DIAGNOSIS — F17210 Nicotine dependence, cigarettes, uncomplicated: Secondary | ICD-10-CM | POA: Insufficient documentation

## 2018-09-28 DIAGNOSIS — B028 Zoster with other complications: Secondary | ICD-10-CM

## 2018-09-28 MED ORDER — OXYCODONE-ACETAMINOPHEN 7.5-325 MG PO TABS
1.0000 | ORAL_TABLET | Freq: Once | ORAL | Status: AC
  Administered 2018-09-28: 1 via ORAL
  Filled 2018-09-28: qty 1

## 2018-09-28 MED ORDER — OXYCODONE-ACETAMINOPHEN 5-325 MG PO TABS: 1.0000 | ORAL_TABLET | Freq: Four times a day (QID) | ORAL | 0 refills | Status: DC | PRN

## 2018-09-28 MED ORDER — DOXYCYCLINE HYCLATE 100 MG PO CAPS: 100.0000 mg | ORAL_CAPSULE | Freq: Two times a day (BID) | ORAL | 0 refills | Status: DC

## 2018-09-28 MED ORDER — CLINDAMYCIN PHOSPHATE 600 MG/4ML IJ SOLN
600.0000 mg | Freq: Once | INTRAMUSCULAR | Status: AC
  Administered 2018-09-28: 600 mg via INTRAMUSCULAR
  Filled 2018-09-28: qty 4

## 2018-09-28 NOTE — ED Notes (Signed)
See triage note  Presents with pain to right ear  States  He was recently treated for shingles  Area is dried up and denies any fever or drainage from ear

## 2018-09-28 NOTE — ED Triage Notes (Signed)
Recently tx'd for shingles to the right side of face, shingles are scabbed over , no open sores noted.  Pt has completed abx therapy but continues to have pain

## 2018-09-28 NOTE — Discharge Instructions (Signed)
Follow-up with your primary care provider or return to the emergency department if any severe worsening of your symptoms.  You should also consider make an appointment with [facility].  Dr. Elenore Rota is on-call today.  This way your hearing can be evaluated to see if there was any damage from your recent shingles outbreak.  Begin taking antibiotics as directed.  Pain medication is only as directed.

## 2018-09-28 NOTE — ED Provider Notes (Signed)
[facility] Emergency Department Provider Note  ____________________________________________   First MD Initiated Contact with Patient 09/28/18 1100     (approximate)  I have reviewed the triage vital signs and the nursing notes.   HISTORY  Chief Complaint Otalgia   HPI Nicholas Valdez is a 38 y.o. male   presents to the ED with complaint of right ear pain.  Patient was seen on 09/17/2018 at which time he was diagnosed with having herpes zoster.  Patient states that he is continued to have ear pain and also has some dizziness.  He denies any fever.  He has completed the Zithromax and acyclovir.  He states that the shingles currently is drying up but he continues to have pain.  He rates his pain as a 10/10.   Past Medical History:  Diagnosis Date  . Hepatitis C     There are no active problems to display for this patient.   History reviewed. No pertinent surgical history.  Prior to Admission medications   Medication Sig Start Date End Date Taking? Authorizing Provider  doxycycline (VIBRAMYCIN) 100 MG capsule Take 1 capsule (100 mg total) by mouth 2 (two) times daily. 09/28/18   Tommi Rumps, PA-C  ibuprofen (ADVIL,MOTRIN) 600 MG tablet Take 1 tablet (600 mg total) by mouth every 8 (eight) hours as needed. 08/07/18   Tommi Rumps, PA-C  oxyCODONE-acetaminophen (PERCOCET) 5-325 MG tablet Take 1 tablet by mouth every 6 (six) hours as needed for severe pain. 09/28/18   Tommi Rumps, PA-C    Allergies Penicillins and Zofran [ondansetron hcl]  No family history on file.  Social History Social History   Tobacco Use  . Smoking status: Current Every Day Smoker  . Smokeless tobacco: Never Used  Substance Use Topics  . Alcohol use: Yes  . Drug use: Yes    Types: Marijuana    Comment: oxycodone-- not prescribed    Review of Systems Constitutional: No fever/chills Eyes: No visual changes. ENT: Positive right ear pain. Cardiovascular:  Denies chest pain. Respiratory: Denies shortness of breath. Musculoskeletal: Negative for back pain. Skin: Positive for resolving herpes zoster right facial area. Neurological: Negative for headaches, focal weakness or numbness. ___________________________________________   PHYSICAL EXAM:  VITAL SIGNS: ED Triage Vitals  Enc Vitals Group     BP 09/28/18 1016 123/88     Pulse Rate 09/28/18 1016 93     Resp 09/28/18 1016 18     Temp 09/28/18 1016 98.1 F (36.7 C)     Temp Source 09/28/18 1016 Oral     SpO2 --      Weight 09/28/18 1017 240 lb (108.9 kg)     Height 09/28/18 1017 6' (1.829 m)     Head Circumference --      Peak Flow --      Pain Score 09/28/18 1017 10     Pain Loc --      Pain Edu? --      Excl. in GC? --     Constitutional: Alert and oriented. Well appearing and in no acute distress. Eyes: Conjunctivae are normal. PERRL. EOMI. Head: Atraumatic. Nose: No congestion/rhinnorhea.  Right TM is dull, erythematous with poor light reflex.  Anterior canal shows a drying herpetic lesion but no lesions are noted in the canal or drainage. Neck: No stridor.   Hematological/Lymphatic/Immunilogical: No cervical lymphadenopathy. Cardiovascular: Normal rate, regular rhythm. Grossly normal heart sounds.  Good peripheral circulation. Respiratory: Normal respiratory effort.  No retractions.  Lungs CTAB. Musculoskeletal: His upper and lower extremities without any difficulty.  Normal gait was noted. Neurologic:  Normal speech and language. No gross focal neurologic deficits are appreciated.  Skin:  Skin is warm, dry.  Resolving herpetic lesions right side of the face as noted above. Psychiatric: Mood and affect are normal. Speech and behavior are normal.  ____________________________________________   LABS (all labs ordered are listed, but only abnormal results are displayed)  Labs Reviewed - No data to display  PROCEDURES  Procedure(s) performed (including Critical  Care):  Procedures   ____________________________________________   INITIAL IMPRESSION / ASSESSMENT AND PLAN / ED COURSE  As part of my medical decision making, I reviewed the following data within the electronic MEDICAL RECORD NUMBER Notes from prior ED visits and [place] Controlled Substance Database  Patient presents to the ED with complaint of right ear pain and resolving herpes zoster to the right side of his face.  He has completed prednisone and acyclovir.  He also took Zithromax and has completed the Percocet.  He states he continues to have pain.  Herpes zoster appears to be healing and crusting over without any signs of infection.  Patient states that he has had a severe reaction in the past to penicillins but is not specific.  He was given clindamycin 600 mg IV and a prescription for doxycycline 100 mg twice daily after consulting up-to-date information.  He was also given a prescription for Percocet to continue taking as needed for pain.  We discussed follow-up with [facility] if any continued problems with his ear and also for evaluation of his hearing.  Contact information was given to him.  ____________________________________________   FINAL CLINICAL IMPRESSION(S) / ED DIAGNOSES  Final diagnoses:  Non-recurrent acute suppurative otitis media of right ear without spontaneous rupture of tympanic membrane  Herpes zoster with other complication     ED Discharge Orders         Ordered    oxyCODONE-acetaminophen (PERCOCET) 5-325 MG tablet  Every 6 hours PRN     09/28/18 1230    doxycycline (VIBRAMYCIN) 100 MG capsule  2 times daily     09/28/18 1230           Note:  This document was prepared using Dragon voice recognition software and may include unintentional dictation errors.    Tommi Rumps, PA-C 09/28/18 1555    Emily Filbert, MD 09/29/18 [phone]

## 2018-10-13 ENCOUNTER — Encounter: Payer: Self-pay | Admitting: Emergency Medicine

## 2018-10-13 ENCOUNTER — Emergency Department
Admission: EM | Admit: 2018-10-13 | Discharge: 2018-10-14 | Disposition: A | Discharge status: Home / Self Care | Payer: Medicaid Other | Attending: Emergency Medicine | Admitting: Emergency Medicine

## 2018-10-13 ENCOUNTER — Other Ambulatory Visit: Payer: Self-pay

## 2018-10-13 DIAGNOSIS — R112 Nausea with vomiting, unspecified: Secondary | ICD-10-CM | POA: Insufficient documentation

## 2018-10-13 DIAGNOSIS — F17210 Nicotine dependence, cigarettes, uncomplicated: Secondary | ICD-10-CM | POA: Insufficient documentation

## 2018-10-13 DIAGNOSIS — R197 Diarrhea, unspecified: Secondary | ICD-10-CM | POA: Insufficient documentation

## 2018-10-13 LAB — CBC
HCT: 45.6 % (ref 39.0–52.0)
Hemoglobin: 16.5 g/dL (ref 13.0–17.0)
MCH: 35.1 pg — ABNORMAL HIGH (ref 26.0–34.0)
MCHC: 36.2 g/dL — ABNORMAL HIGH (ref 30.0–36.0)
MCV: 97 fL (ref 80.0–100.0)
Platelets: 266 10*3/uL (ref 150–400)
RBC: 4.7 MIL/uL (ref 4.22–5.81)
RDW: 13.1 % (ref 11.5–15.5)
WBC: 7.3 10*3/uL (ref 4.0–10.5)
nRBC: 0 % (ref 0.0–0.2)

## 2018-10-13 LAB — COMPREHENSIVE METABOLIC PANEL
ALK PHOS: 48 U/L (ref 38–126)
ALT: 32 U/L (ref 0–44)
AST: 38 U/L (ref 15–41)
Albumin: 4.3 g/dL (ref 3.5–5.0)
Anion gap: 8 (ref 5–15)
BUN: 9 mg/dL (ref 6–20)
CALCIUM: 9.2 mg/dL (ref 8.9–10.3)
CO2: 23 mmol/L (ref 22–32)
Chloride: 105 mmol/L (ref 98–111)
Creatinine, Ser: 0.96 mg/dL (ref 0.61–1.24)
GFR calc Af Amer: >60 mL/min (ref >60)
GFR calc non Af Amer: >60 mL/min (ref >60)
Glucose, Bld: 104 mg/dL — ABNORMAL HIGH (ref 70–99)
Potassium: 3.9 mmol/L (ref 3.5–5.1)
Sodium: 136 mmol/L (ref 135–145)
Total Bilirubin: 1.4 mg/dL — ABNORMAL HIGH (ref 0.3–1.2)
Total Protein: 7.3 g/dL (ref 6.5–8.1)

## 2018-10-13 LAB — LIPASE, BLOOD: Lipase: 21 U/L (ref 11–51)

## 2018-10-13 MED ORDER — SODIUM CHLORIDE 0.9 % IV BOLUS
1000.0000 mL | Freq: Once | INTRAVENOUS | Status: AC
  Administered 2018-10-13: 1000 mL via INTRAVENOUS

## 2018-10-13 MED ORDER — SODIUM CHLORIDE 0.9% FLUSH
3.0000 mL | Freq: Once | INTRAVENOUS | Status: AC
  Administered 2018-10-13: 3 mL via INTRAVENOUS

## 2018-10-13 MED ORDER — PROMETHAZINE HCL 25 MG/ML IJ SOLN
12.5000 mg | Freq: Once | INTRAMUSCULAR | Status: AC
  Administered 2018-10-13: 12.5 mg via INTRAVENOUS
  Filled 2018-10-13: qty 1

## 2018-10-13 NOTE — ED Provider Notes (Signed)
[facility] Emergency Department Provider Note      I have reviewed the triage vital signs and the nursing notes.   HISTORY  Chief Complaint Emesis; Herpes Zoster; Hearing Loss; and Diarrhea   HPI Nicholas Valdez is a 38 y.o. male presents to the emergency department with nausea vomiting diarrhea after being treated for herpes zoster and otitis media.  Patient states that he usually gets nausea vomiting diarrhea whenever he is on antibiotic therapy.  He states that he has not had a bowel movement in the past 2 days but however when he does have one it is just water.  Patient denies any fever.  Patient denies any abdominal pain.        Past Medical History:  Diagnosis Date  . Hepatitis C     There are no active problems to display for this patient.   History reviewed. No pertinent surgical history.  Prior to Admission medications   Medication Sig Start Date End Date Taking? Authorizing Provider  doxycycline (VIBRAMYCIN) 100 MG capsule Take 1 capsule (100 mg total) by mouth 2 (two) times daily. 09/28/18   Tommi Rumps, PA-C  ibuprofen (ADVIL,MOTRIN) 600 MG tablet Take 1 tablet (600 mg total) by mouth every 8 (eight) hours as needed. 08/07/18   Tommi Rumps, PA-C  oxyCODONE-acetaminophen (PERCOCET) 5-325 MG tablet Take 1 tablet by mouth every 6 (six) hours as needed for severe pain. 09/28/18   Tommi Rumps, PA-C    Allergies Penicillins and Zofran Frazier Richards hcl]  History reviewed. No pertinent family history.  Social History Social History   Tobacco Use  . Smoking status: Current Every Day Smoker  . Smokeless tobacco: Never Used  Substance Use Topics  . Alcohol use: Yes  . Drug use: Yes    Types: Marijuana    Comment: oxycodone-- not prescribed    Review of Systems Constitutional: No fever/chills Eyes: No visual changes. ENT: No sore throat. Cardiovascular: Denies chest pain. Respiratory: Denies shortness of  breath. Gastrointestinal: No abdominal pain.  No nausea, no vomiting.  No diarrhea.  No constipation. Genitourinary: Negative for dysuria. Musculoskeletal: Negative for neck pain.  Negative for back pain. Integumentary: Negative for rash. Neurological: Negative for headaches, focal weakness or numbness.   ____________________________________________   PHYSICAL EXAM:  VITAL SIGNS: ED Triage Vitals  Enc Vitals Group     BP 10/13/18 1943 132/89     Pulse Rate 10/13/18 1943 97     Resp 10/13/18 1943 18     Temp 10/13/18 1943 98.1 F (36.7 C)     Temp Source 10/13/18 1943 Oral     SpO2 10/13/18 1943 98 %     Weight 10/13/18 1944 109.3 kg (241 lb)     Height 10/13/18 1944 1.829 m (6')     Head Circumference --      Peak Flow --      Pain Score 10/13/18 1943 6     Pain Loc --      Pain Edu? --      Excl. in GC? --     Constitutional: Alert and oriented. Well appearing and in no acute distress. Eyes: Conjunctivae are normal. PERRL. EOMI. Mouth/Throat: Mucous membranes are moist.  Oropharynx non-erythematous. Neck: No stridor.  Cardiovascular: Normal rate, regular rhythm. Good peripheral circulation. Grossly normal heart sounds. Respiratory: Normal respiratory effort.  No retractions. Lungs CTAB. Gastrointestinal: Soft and nontender. No distention.  Musculoskeletal: No lower extremity tenderness nor edema. No gross deformities  of extremities. Neurologic:  Normal speech and language. No gross focal neurologic deficits are appreciated.  Skin:  Skin is warm, dry and intact. No rash noted. Psychiatric: Mood and affect are normal. Speech and behavior are normal.  ____________________________________________   LABS (all labs ordered are listed, but only abnormal results are displayed)  Labs Reviewed  COMPREHENSIVE METABOLIC PANEL - Abnormal; Notable for the following components:      Result Value   Glucose, Bld 104 (*)    Total Bilirubin 1.4 (*)    All other components within  normal limits  CBC - Abnormal; Notable for the following components:   MCH 35.1 (*)    MCHC 36.2 (*)    All other components within normal limits  LIPASE, BLOOD  URINALYSIS, COMPLETE (UACMP) WITH MICROSCOPIC       Procedures   ____________________________________________   INITIAL IMPRESSION / MDM / ASSESSMENT AND PLAN / ED COURSE  As part of my medical decision making, I reviewed the following data within the electronic MEDICAL RECORD NUMBER   38 year old male presenting with above-stated history and physical exam secondary to nausea vomiting and diarrhea.  Patient unable to give a stool sample in the emergency department for evaluation of infectious etiology including C. difficile.  Patient given IV normal saline and Phenergan secondary to his allergy to Zofran.  Patient be prescribed Phenergan for home.     ____________________________________________  FINAL CLINICAL IMPRESSION(S) / ED DIAGNOSES  Final diagnoses:  Nausea vomiting and diarrhea     MEDICATIONS GIVEN DURING THIS VISIT:  Medications  sodium chloride flush (NS) 0.9 % injection 3 mL (has no administration in time range)     ED Discharge Orders    None       Note:  This document was prepared using Dragon voice recognition software and may include unintentional dictation errors.   Darci Current, MD 10/14/18 [phone]

## 2018-10-13 NOTE — ED Notes (Signed)
Contact precautions kit hung on pt's door.

## 2018-10-13 NOTE — ED Triage Notes (Signed)
Pt arrived to the ED for complaints of N/V/D andc generalized aches. Pt states that last week he was diagnosed with shingles and was prescribed antibiotics for a possible ear infection and now is experiencing N/V/D. Pt is AOx4 in no apparent distress.

## 2018-10-14 MED ORDER — PROMETHAZINE HCL 12.5 MG PO TABS: 12.5000 mg | ORAL_TABLET | Freq: Four times a day (QID) | ORAL | 0 refills | Status: DC | PRN

## 2018-10-14 NOTE — ED Notes (Signed)
Pt signed printed d/c paperwork as the topaz froze.

## 2018-12-15 ENCOUNTER — Other Ambulatory Visit: Payer: Self-pay

## 2018-12-15 ENCOUNTER — Inpatient Hospital Stay: Payer: Self-pay

## 2018-12-15 ENCOUNTER — Inpatient Hospital Stay
Admission: EM | Admit: 2018-12-15 | Discharge: 2018-12-16 | DRG: 683 | Disposition: A | Discharge status: Home / Self Care | Payer: Self-pay | Attending: Internal Medicine | Admitting: Internal Medicine

## 2018-12-15 DIAGNOSIS — D72829 Elevated white blood cell count, unspecified: Secondary | ICD-10-CM | POA: Diagnosis present

## 2018-12-15 DIAGNOSIS — F10939 Alcohol use, unspecified with withdrawal, unspecified: Secondary | ICD-10-CM | POA: Diagnosis present

## 2018-12-15 DIAGNOSIS — R6510 Systemic inflammatory response syndrome (SIRS) of non-infectious origin without acute organ dysfunction: Secondary | ICD-10-CM | POA: Diagnosis present

## 2018-12-15 DIAGNOSIS — F1910 Other psychoactive substance abuse, uncomplicated: Secondary | ICD-10-CM | POA: Diagnosis present

## 2018-12-15 DIAGNOSIS — F10239 Alcohol dependence with withdrawal, unspecified: Secondary | ICD-10-CM | POA: Diagnosis present

## 2018-12-15 DIAGNOSIS — E860 Dehydration: Secondary | ICD-10-CM | POA: Diagnosis present

## 2018-12-15 DIAGNOSIS — N179 Acute kidney failure, unspecified: Principal | ICD-10-CM | POA: Diagnosis present

## 2018-12-15 DIAGNOSIS — F17200 Nicotine dependence, unspecified, uncomplicated: Secondary | ICD-10-CM | POA: Diagnosis present

## 2018-12-15 DIAGNOSIS — R000 Tachycardia, unspecified: Secondary | ICD-10-CM

## 2018-12-15 DIAGNOSIS — Z23 Encounter for immunization: Secondary | ICD-10-CM

## 2018-12-15 DIAGNOSIS — Z880 Allergy status to penicillin: Secondary | ICD-10-CM

## 2018-12-15 DIAGNOSIS — B182 Chronic viral hepatitis C: Secondary | ICD-10-CM | POA: Diagnosis present

## 2018-12-15 DIAGNOSIS — Z20828 Contact with and (suspected) exposure to other viral communicable diseases: Secondary | ICD-10-CM | POA: Diagnosis present

## 2018-12-15 LAB — LACTIC ACID, PLASMA: Lactic Acid, Venous: 1.1 mmol/L (ref 0.5–1.9)

## 2018-12-15 LAB — BASIC METABOLIC PANEL
Anion gap: 17 — ABNORMAL HIGH (ref 5–15)
BUN: 14 mg/dL (ref 6–20)
CO2: 21 mmol/L — ABNORMAL LOW (ref 22–32)
Calcium: 9.3 mg/dL (ref 8.9–10.3)
Chloride: 106 mmol/L (ref 98–111)
Creatinine, Ser: 1.71 mg/dL — ABNORMAL HIGH (ref 0.61–1.24)
GFR calc Af Amer: 59 mL/min — ABNORMAL LOW (ref >60)
GFR calc non Af Amer: 51 mL/min — ABNORMAL LOW (ref >60)
Glucose, Bld: 239 mg/dL — ABNORMAL HIGH (ref 70–99)
Potassium: 3.7 mmol/L (ref 3.5–5.1)
Sodium: 144 mmol/L (ref 135–145)

## 2018-12-15 LAB — CBC
HCT: 45.5 % (ref 39.0–52.0)
Hemoglobin: 16.2 g/dL (ref 13.0–17.0)
MCH: 35.4 pg — ABNORMAL HIGH (ref 26.0–34.0)
MCHC: 35.6 g/dL (ref 30.0–36.0)
MCV: 99.6 fL (ref 80.0–100.0)
Platelets: 333 10*3/uL (ref 150–400)
RBC: 4.57 MIL/uL (ref 4.22–5.81)
RDW: 13.1 % (ref 11.5–15.5)
WBC: 17.3 10*3/uL — ABNORMAL HIGH (ref 4.0–10.5)
nRBC: 0 % (ref 0.0–0.2)

## 2018-12-15 LAB — SARS CORONAVIRUS 2 BY RT PCR (HOSPITAL ORDER, PERFORMED IN ~~LOC~~ HOSPITAL LAB): SARS Coronavirus 2: NEGATIVE

## 2018-12-15 LAB — TROPONIN I: Troponin I: <0.03 ng/mL (ref <0.03)

## 2018-12-15 LAB — GLUCOSE, CAPILLARY: Glucose-Capillary: 107 mg/dL — ABNORMAL HIGH (ref 70–99)

## 2018-12-15 LAB — PROCALCITONIN: Procalcitonin: <0.1 ng/mL

## 2018-12-15 LAB — ETHANOL: Alcohol, Ethyl (B): <10 mg/dL (ref <10)

## 2018-12-15 MED ORDER — VITAMIN D 25 MCG (1000 UNIT) PO TABS
2000.0000 [IU] | ORAL_TABLET | Freq: Every day | ORAL | Status: DC
  Administered 2018-12-15 – 2018-12-16 (×2): 2000 [IU] via ORAL
  Filled 2018-12-15 (×2): qty 2

## 2018-12-15 MED ORDER — LORAZEPAM 2 MG/ML IJ SOLN
2.0000 mg | Freq: Once | INTRAMUSCULAR | Status: AC
  Administered 2018-12-15: 06:00:00 2 mg via INTRAVENOUS
  Filled 2018-12-15: qty 1

## 2018-12-15 MED ORDER — SODIUM CHLORIDE 0.9 % IV SOLN
INTRAVENOUS | Status: DC
  Administered 2018-12-15 – 2018-12-16 (×3): via INTRAVENOUS

## 2018-12-15 MED ORDER — SODIUM CHLORIDE 0.9 % IV BOLUS
1000.0000 mL | Freq: Once | INTRAVENOUS | Status: AC
  Administered 2018-12-15: 1000 mL via INTRAVENOUS

## 2018-12-15 MED ORDER — VITAMIN B-1 100 MG PO TABS
100.0000 mg | ORAL_TABLET | Freq: Every day | ORAL | Status: DC
  Administered 2018-12-16: 10:00:00 100 mg via ORAL
  Filled 2018-12-15: qty 1

## 2018-12-15 MED ORDER — CHLORDIAZEPOXIDE HCL 25 MG PO CAPS
25.0000 mg | ORAL_CAPSULE | Freq: Four times a day (QID) | ORAL | Status: DC
  Administered 2018-12-15 – 2018-12-16 (×5): 25 mg via ORAL
  Filled 2018-12-15 (×5): qty 1

## 2018-12-15 MED ORDER — POLYETHYLENE GLYCOL 3350 17 G PO PACK: 17.0000 g | PACK | Freq: Every day | ORAL | Status: DC | PRN

## 2018-12-15 MED ORDER — ENOXAPARIN SODIUM 40 MG/0.4ML ~~LOC~~ SOLN
40.0000 mg | SUBCUTANEOUS | Status: DC
  Administered 2018-12-15: 22:00:00 40 mg via SUBCUTANEOUS
  Filled 2018-12-15: qty 0.4

## 2018-12-15 MED ORDER — THIAMINE HCL 100 MG/ML IJ SOLN
100.0000 mg | Freq: Every day | INTRAMUSCULAR | Status: DC
  Administered 2018-12-15: 10:00:00 100 mg via INTRAVENOUS
  Filled 2018-12-15: qty 2

## 2018-12-15 MED ORDER — VITAMIN B-1 100 MG PO TABS: 100.0000 mg | ORAL_TABLET | Freq: Every day | ORAL | Status: DC

## 2018-12-15 MED ORDER — SODIUM CHLORIDE 0.9 % IV BOLUS
1000.0000 mL | Freq: Once | INTRAVENOUS | Status: AC
  Administered 2018-12-15: 06:00:00 1000 mL via INTRAVENOUS

## 2018-12-15 MED ORDER — PROMETHAZINE HCL 25 MG PO TABS: 12.5000 mg | ORAL_TABLET | Freq: Four times a day (QID) | ORAL | Status: DC | PRN

## 2018-12-15 MED ORDER — LORAZEPAM 2 MG/ML IJ SOLN
0.0000 mg | Freq: Four times a day (QID) | INTRAMUSCULAR | Status: DC
  Administered 2018-12-15: 2 mg via INTRAVENOUS
  Filled 2018-12-15: qty 1

## 2018-12-15 MED ORDER — LORAZEPAM 2 MG PO TABS: 0.0000 mg | ORAL_TABLET | Freq: Four times a day (QID) | ORAL | Status: DC

## 2018-12-15 MED ORDER — FOLIC ACID 1 MG PO TABS
1.0000 mg | ORAL_TABLET | Freq: Every day | ORAL | Status: DC
  Administered 2018-12-15 – 2018-12-16 (×2): 1 mg via ORAL
  Filled 2018-12-15 (×2): qty 1

## 2018-12-15 MED ORDER — ADULT MULTIVITAMIN W/MINERALS CH
1.0000 | ORAL_TABLET | Freq: Every day | ORAL | Status: DC
  Administered 2018-12-15 – 2018-12-16 (×2): 1 via ORAL
  Filled 2018-12-15 (×2): qty 1

## 2018-12-15 MED ORDER — VITAMIN C 500 MG PO TABS
1000.0000 mg | ORAL_TABLET | Freq: Every day | ORAL | Status: DC
  Administered 2018-12-15 – 2018-12-16 (×2): 1000 mg via ORAL
  Filled 2018-12-15 (×2): qty 2

## 2018-12-15 MED ORDER — LORAZEPAM 2 MG/ML IJ SOLN
1.0000 mg | Freq: Once | INTRAMUSCULAR | Status: AC
  Administered 2018-12-15: 04:00:00 1 mg via INTRAVENOUS
  Filled 2018-12-15: qty 1

## 2018-12-15 MED ORDER — LORAZEPAM 2 MG/ML IJ SOLN: 0.0000 mg | Freq: Two times a day (BID) | INTRAMUSCULAR | Status: DC

## 2018-12-15 MED ORDER — THIAMINE HCL 100 MG/ML IJ SOLN: 100.0000 mg | Freq: Every day | INTRAMUSCULAR | Status: DC

## 2018-12-15 MED ORDER — LORAZEPAM 2 MG PO TABS: 0.0000 mg | ORAL_TABLET | Freq: Two times a day (BID) | ORAL | Status: DC

## 2018-12-15 MED ORDER — LORAZEPAM 2 MG/ML IJ SOLN
1.0000 mg | Freq: Four times a day (QID) | INTRAMUSCULAR | Status: DC | PRN
  Administered 2018-12-16 (×2): 1 mg via INTRAVENOUS
  Filled 2018-12-15 (×2): qty 1

## 2018-12-15 MED ORDER — LORAZEPAM 1 MG PO TABS
1.0000 mg | ORAL_TABLET | Freq: Four times a day (QID) | ORAL | Status: DC | PRN
  Administered 2018-12-15 (×2): 1 mg via ORAL
  Filled 2018-12-15 (×2): qty 1

## 2018-12-15 MED ORDER — PNEUMOCOCCAL VAC POLYVALENT 25 MCG/0.5ML IJ INJ
0.5000 mL | INJECTION | INTRAMUSCULAR | Status: AC
  Administered 2018-12-16: 10:00:00 0.5 mL via INTRAMUSCULAR
  Filled 2018-12-15: qty 0.5

## 2018-12-15 MED ORDER — SODIUM CHLORIDE 0.9% FLUSH: 3.0000 mL | Freq: Once | INTRAVENOUS | Status: DC

## 2018-12-15 NOTE — ED Notes (Signed)
Upon reassessment pt is sleeping soundly. Pt is not easily arousable but vitals are stable and pt is breathing without difficulty. Bed locked and in lowest position.

## 2018-12-15 NOTE — ED Notes (Signed)
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

## 2018-12-15 NOTE — ED Notes (Signed)
Pt remains restless but st feeling better now; st he would like to speak to the doctor about getting librium; MD notified

## 2018-12-15 NOTE — H&P (Signed)
[facility]   PATIENT NAME: Nicholas Valdez    MR#:  [id number]  DATE OF BIRTH:  09-29-1980  DATE OF ADMISSION:  12/15/2018  PRIMARY CARE PHYSICIAN: Patient, No Pcp Per   REQUESTING/REFERRING PHYSICIAN: Phineas Semen, MD  CHIEF COMPLAINT:  Alcohol withdrawal   HISTORY OF PRESENT ILLNESS:  Nicholas Valdez  is a 38 y.o. male with a known history of polysubstance abuse and hepatitis C who presented to the ED for assistance with alcohol withdrawal.  Patient is very sleepy after receiving Ativan in the ED.  History obtained from ED physician, as patient is not really answering questions.  Apparently he came to the ED for help quitting alcohol.  He has been drinking a bit of liquor every day.  Last drink was 5/11 in the early evening.  He also endorses drug use.  In the ED, he was initially tachycardic with heart rates in the 130s.  Heart rates improved to the high 90s with Ativan.  Labs were significant for creatinine 1.71, WBC 17.3.  He was given IV Ativan.  Hospitalists were called for admission.  PAST MEDICAL HISTORY:   Past Medical History:  Diagnosis Date  . Hepatitis C     PAST SURGICAL HISTORY:  History reviewed. No pertinent surgical history.  SOCIAL HISTORY:   Social History   Tobacco Use  . Smoking status: Current Every Day Smoker  . Smokeless tobacco: Never Used  Substance Use Topics  . Alcohol use: Yes    FAMILY HISTORY:  Father-alcohol abuse  DRUG ALLERGIES:   Allergies  Allergen Reactions  . Penicillins     Did it involve swelling of the face/tongue/throat, SOB, or low BP? Yes Did it involve sudden or severe rash/hives, skin peeling, or any reaction on the inside of your mouth or nose? Yes Did you need to seek medical attention at a hospital or doctor's office? Yes When did it last happen?childhood If all above answers are "NO", may proceed with cephalosporin use.  Marland Kitchen Zofran [Ondansetron Hcl]      REVIEW OF SYSTEMS:   ROS-unable to obtain, as patient is very sleepy.  MEDICATIONS AT HOME:   Prior to Admission medications   Medication Sig Start Date End Date Taking? Authorizing Provider  acetaminophen (TYLENOL) 325 MG tablet Take 650 mg by mouth every 6 (six) hours as needed.   Yes [provider]  Ascorbic Acid (VITAMIN C) 1000 MG tablet Take 1,000 mg by mouth daily.   Yes [provider]  cholecalciferol (VITAMIN D3) 25 MCG (1000 UT) tablet Take 2,000 Units by mouth daily.   Yes [provider]      VITAL SIGNS:  Blood pressure (!) 138/97, pulse (!) 130, temperature 98.9 F (37.2 C), temperature source Oral, resp. rate (!) 31, height 6' (1.829 m), weight 113.4 kg, SpO2 98 %.  PHYSICAL EXAMINATION:  Physical Exam  GENERAL:  38 y.o.-year-old patient lying in the bed with no acute distress.  EYES: Pupils equal, round, reactive to light and accommodation. No scleral icterus. Extraocular muscles intact.  HEENT: Head atraumatic, normocephalic. Oropharynx and nasopharynx clear.  NECK:  Supple, no jugular venous distention. No thyroid enlargement, no tenderness.  LUNGS: Normal breath sounds bilaterally, no wheezing, rales,rhonchi or crepitation. No use of accessory muscles of respiration.  CARDIOVASCULAR: Tachycardic, regular rhythm, S1, S2 normal. No murmurs, rubs, or gallops.  ABDOMEN: Soft, nontender, nondistended. Bowel sounds present. No organomegaly or mass.  EXTREMITIES: No pedal edema, cyanosis, or clubbing.  NEUROLOGIC: Sleepy, arouses to voice.  Does not answer questions.  Moves all extremities.  No obvious focal deficits. PSYCHIATRIC: Unable to assess. SKIN: No obvious rash, lesion, or ulcer.   LABORATORY PANEL:   CBC Recent Labs  Lab 12/15/18 0341  WBC 17.3*  HGB 16.2  HCT 45.5  PLT 333   ------------------------------------------------------------------------------------------------------------------  Chemistries  Recent Labs   Lab 12/15/18 0341  NA 144  K 3.7  CL 106  CO2 21*  GLUCOSE 239*  BUN 14  CREATININE 1.71*  CALCIUM 9.3   ------------------------------------------------------------------------------------------------------------------  Cardiac Enzymes Recent Labs  Lab 12/15/18 0341  TROPONINI <0.03   ------------------------------------------------------------------------------------------------------------------  RADIOLOGY:  No results found.    IMPRESSION AND PLAN:   Alcohol withdrawal-patient has been drinking a fifth of liquor daily. -CIWA -Start scheduled Librium -Substance abuse counseling when patient is more alert   SIRS- patient meeting sirs criteria on admission with tachycardia and leukocytosis.  No obvious source of infection.  May be all related to alcohol withdrawal.  No fevers. -Check chest x-ray, UA, procalcitonin, lactic acid to rule out infectious etiologies -Blood and urine cultures -Hold on antibiotics at this time, as we do not have an obvious source -Would need to consider endocarditis, given patient's substance abuse history  AKI- likely due to dehydration -IV fluids -Avoid nephrotoxic agents -Recheck creatinine in the morning  Polysubstance abuse- known to use alcohol.  Also reports " sharing needles with sketchy people". -UDS pending -CIWA as above -Check HIV -Needs substance abuse counseling when more alert -SW consult  History of hepatitis C- per chart review, does not look like patient is ever received treatment for this. -Check hepatitis C RNA quant -Needs to follow-up with GI as an outpatient  All the records are reviewed and case discussed with ED provider. Management plans discussed with the patient, family and they are in agreement.  CODE STATUS: Full  TOTAL TIME TAKING CARE OF THIS PATIENT: 45 minutes.    Jinny Blossom Mayo M.D on 12/15/2018 at 8:18 AM  Between 7am to 6pm - Pager - [phone]  After 6pm go to [email or web address] - Geophysicist/field seismologist  [facility]  Office  [phone]  CC: Primary care physician; Patient, No Pcp Per   Note: This dictation was prepared with Dragon dictation along with smaller phrase technology. Any transcriptional errors that result from this process are unintentional.

## 2018-12-15 NOTE — ED Notes (Signed)
Pt remains restless, anxious; MD aware

## 2018-12-15 NOTE — ED Notes (Signed)
Unable to call report at this time. Floor reports they will call as soon as possible.

## 2018-12-15 NOTE — ED Triage Notes (Signed)
Pt arrives to ED via [facility] from home with c/o needing detox from ETOH. Per EMS, pt has not drank ETOH in 2 days, found some alcohol at home tonight and drank it. Pt then reported to EMS that he injected Aderall IV about 2 hrs PTA. PT is A&O, in NAD; RR even, regular, and unlabored.

## 2018-12-15 NOTE — ED Notes (Signed)
Pt requesting to be tested for HIV and AIDS after using needles from "sketchy people."

## 2018-12-15 NOTE — ED Notes (Signed)
ED TO INPATIENT HANDOFF REPORT  ED Nurse Name and Phone #: [phone]  S Name/Age/Gender Nicholas Valdez 38 y.o. male Room/Bed: ED04A/ED04A  Code Status   Code Status: Not on file  Home/SNF/Other Home Patient oriented to: self, place, time and situation Is this baseline? Yes   Triage Complete: Triage complete  Chief Complaint Detox  Triage Note Pt arrives to ED via [facility] from home with c/o needing detox from ETOH. Per EMS, pt has not drank ETOH in 2 days, found some alcohol at home tonight and drank it. Pt then reported to EMS that he injected Aderall IV about 2 hrs PTA. PT is A&O, in NAD; RR even, regular, and unlabored.   Allergies Allergies  Allergen Reactions  . Penicillins     Did it involve swelling of the face/tongue/throat, SOB, or low BP? Yes Did it involve sudden or severe rash/hives, skin peeling, or any reaction on the inside of your mouth or nose? Yes Did you need to seek medical attention at a hospital or doctor's office? Yes When did it last happen?childhood If all above answers are "NO", may proceed with cephalosporin use.  Marland Kitchen Zofran [Ondansetron Hcl]     Level of Care/Admitting Diagnosis ED Disposition    ED Disposition Condition Comment   Admit  [facility]ospital [facility]: [facility] [[id number]]  Level of Care: Med-Surg [16]  Covid Evaluation: N/A  Diagnosis: Alcohol withdrawal (HCC) [291.81.ICD-9-CM]  Admitting Physician: Willadean Carol DODD [[id number]]  Attending Physician: Willadean Carol DODD [[id number]]  Estimated length of stay: past midnight tomorrow  Certification:: I certify this patient will need inpatient services for at least 2 midnights  PT Class (Do Not Modify): Inpatient [101]  PT Acc Code (Do Not Modify): Private [1]       B Medical/Surgery History Past Medical History:  Diagnosis Date  . Hepatitis C    History reviewed. No pertinent surgical history.   A IV Location/Drains/Wounds Patient Lines/Drains/Airways Status    Active Line/Drains/Airways    Name:   Placement date:   Placement time:   Site:   Days:   Peripheral IV 12/15/18 Left Antecubital   12/15/18    0340    Antecubital   less than 1          Intake/Output Last 24 hours  Intake/Output Summary (Last 24 hours) at 12/15/2018 0911 Last data filed at 12/15/2018 0813 Gross per 24 hour  Intake 2000 ml  Output -  Net 2000 ml    Labs/Imaging Results for orders placed or performed during the hospital encounter of 12/15/18 (from the past 48 hour(s))  Basic metabolic panel     Status: Abnormal   Collection Time: 12/15/18  3:41 AM  Result Value Ref Range   Sodium 144 135 - 145 mmol/L   Potassium 3.7 3.5 - 5.1 mmol/L   Chloride 106 98 - 111 mmol/L   CO2 21 (L) 22 - 32 mmol/L   Glucose, Bld 239 (H) 70 - 99 mg/dL   BUN 14 6 - 20 mg/dL   Creatinine, Ser 9.56 (H) 0.61 - 1.24 mg/dL   Calcium 9.3 8.9 - 21.3 mg/dL   GFR calc non Af Amer 51 (L) >60 mL/min   GFR calc Af Amer 59 (L) >60 mL/min   Anion gap 17 (H) 5 - 15    Comment: Performed at [facility], [address] [place], [place] [address]  CBC     Status: Abnormal   Collection Time: 12/15/18  3:41 AM  Result Value Ref Range   WBC 17.3 (H) 4.0 - 10.5 K/uL   RBC 4.57 4.22 - 5.81 MIL/uL   Hemoglobin 16.2 13.0 - 17.0 g/dL   HCT 24.2 35.3 - 61.4 %   MCV 99.6 80.0 - 100.0 fL   MCH 35.4 (H) 26.0 - 34.0 pg   MCHC 35.6 30.0 - 36.0 g/dL   RDW 43.1 54.0 - 08.6 %   Platelets 333 150 - 400 K/uL   nRBC 0.0 0.0 - 0.2 %    Comment: Performed at [facility], [address] [place], [place] [address]  Troponin I - ONCE - STAT     Status: None   Collection Time: 12/15/18  3:41 AM  Result Value Ref Range   Troponin I <0.03 <0.03 ng/mL    Comment: Performed at [facility], [address] [place], [place] [address]  Ethanol     Status: None   Collection Time: 12/15/18  3:41 AM  Result Value Ref Range   Alcohol, Ethyl (B) <10 <10 mg/dL    Comment: (NOTE) Lowest  detectable limit for serum alcohol is 10 mg/dL. For medical purposes only. Performed at [facility], [address] [place], [place] [address]    No results found.  Pending Labs [employer] (From admission, onward)    Start     Ordered   12/15/18 0701  SARS Coronavirus 2 [facility] order, Performed in [facility] hospital lab)  (Novel Coronavirus, [facility] Order))  Once,   STAT     12/15/18 0700   12/15/18 0343  Urine Drug Screen, Qualitative ([facility] only)  Once,   STAT     12/15/18 0342   Signed and Held  HIV antibody (Routine Testing)  Once,   R     Signed and Held   Signed and Held  Basic metabolic panel  Tomorrow morning,   R     Signed and Held   Signed and Held  CBC  Tomorrow morning,   R     Signed and Held   Signed and Held  Lactic acid, plasma  Once,   R     Signed and Held   Signed and Held  Procalcitonin - Baseline  ONCE - STAT,   STAT     Signed and Held   Signed and Held  Urinalysis, Complete w Microscopic  Once,   R     Signed and Held   Signed and Held  HCV RNA quant  Once,   R     Signed and Held          Vitals/Pain Today's Vitals   12/15/18 0800 12/15/18 0828 12/15/18 0830 12/15/18 0900  BP: (!) 138/97 (!) 138/97 114/77 122/86  Pulse:  99 99 98  Resp:  (!) 28 19 (!) 21  Temp:      TempSrc:      SpO2:   94% 94%  Weight:      Height:      PainSc:        Isolation Precautions No active isolations  Medications Medications  LORazepam (ATIVAN) injection 0-4 mg (2 mg Intravenous Given 12/15/18 0734)    Or  LORazepam (ATIVAN) tablet 0-4 mg ( Oral See Alternative 12/15/18 0734)  LORazepam (ATIVAN) injection 0-4 mg (has no administration in time range)    Or  LORazepam (ATIVAN) tablet 0-4 mg (has no administration in time range)  thiamine (VITAMIN B-1) tablet 100 mg (has no administration in time range)  Or  thiamine (B-1) injection 100 mg (has no administration in time range)  sodium chloride 0.9 % bolus 1,000 mL (0 mLs  Intravenous Stopped 12/15/18 0530)  LORazepam (ATIVAN) injection 1 mg (1 mg Intravenous Given 12/15/18 0423)  LORazepam (ATIVAN) injection 2 mg (2 mg Intravenous Given 12/15/18 0555)  sodium chloride 0.9 % bolus 1,000 mL (0 mLs Intravenous Stopped 12/15/18 0813)    Mobility walks Moderate fall risk   Focused Assessments other   R Recommendations: See Admitting Provider Note  Report given to:   Additional Notes:  Pt is drowsy after receiving Ativan.

## 2018-12-15 NOTE — ED Provider Notes (Signed)
[facility] Emergency Department Provider Note  ____________________________________________   I have reviewed the triage vital signs and the nursing notes.   HISTORY  Chief Complaint Withdrawal  History limited by: Not Limited   HPI Nicholas Valdez is a 38 y.o. male who presents to the emergency department today because of desire to have help with alcohol withdrawal. The patient states that he has been drinking about a fifth of liqueur a day. He states that last use was roughly 12 hours ago. He did alos take IV adderall that he got from a friend. He says taht he has had sobriety in the past and is not interested in inpatient rehab at this time, but would like to have medication to use at home.     Records reviewed. Per medical record review patient has a history of hepatitis c  Past Medical History:  Diagnosis Date  . Hepatitis C     There are no active problems to display for this patient.   History reviewed. No pertinent surgical history.  Prior to Admission medications   Medication Sig Start Date End Date Taking? Authorizing Provider  doxycycline (VIBRAMYCIN) 100 MG capsule Take 1 capsule (100 mg total) by mouth 2 (two) times daily. 09/28/18   Tommi Rumps, PA-C  ibuprofen (ADVIL,MOTRIN) 600 MG tablet Take 1 tablet (600 mg total) by mouth every 8 (eight) hours as needed. 08/07/18   Tommi Rumps, PA-C  oxyCODONE-acetaminophen (PERCOCET) 5-325 MG tablet Take 1 tablet by mouth every 6 (six) hours as needed for severe pain. 09/28/18   Tommi Rumps, PA-C  promethazine (PHENERGAN) 12.5 MG tablet Take 1 tablet (12.5 mg total) by mouth every 6 (six) hours as needed for nausea or vomiting. 10/14/18   Darci Current, MD    Allergies Penicillins and Zofran Frazier Richards hcl]  No family history on file.  Social History Social History   Tobacco Use  . Smoking status: Current Every Day Smoker  . Smokeless tobacco: Never Used  Substance Use  Topics  . Alcohol use: Yes  . Drug use: Yes    Types: Marijuana    Comment: oxycodone-- not prescribed    Review of Systems Constitutional: No fever/chills Eyes: No visual changes. ENT: No sore throat. Cardiovascular: Denies chest pain. Respiratory: Denies shortness of breath. Gastrointestinal: No abdominal pain.  No nausea, no vomiting.  No diarrhea.   Genitourinary: Negative for dysuria. Musculoskeletal: Negative for back pain. Skin: Negative for rash. Neurological: Negative for headaches, focal weakness or numbness.  ____________________________________________   PHYSICAL EXAM:  VITAL SIGNS: ED Triage Vitals  Enc Vitals Group     BP 12/15/18 0335 (!) 166/102     Pulse Rate 12/15/18 0335 (!) 151     Resp 12/15/18 0335 18     Temp 12/15/18 0335 98.9 F (37.2 C)     Temp Source 12/15/18 0335 Oral     SpO2 12/15/18 0332 98 %     Weight 12/15/18 0336 250 lb (113.4 kg)     Height 12/15/18 0336 6' (1.829 m)     Head Circumference --      Peak Flow --      Pain Score 12/15/18 0336 0   Constitutional: Alert and oriented.  Eyes: Conjunctivae are normal.  ENT      Head: Normocephalic and atraumatic.      Nose: No congestion/rhinnorhea.      Mouth/Throat: Mucous membranes are moist.      Neck: No stridor. Hematological/Lymphatic/Immunilogical: No  cervical lymphadenopathy. Cardiovascular: Tachycardia,  regular rhythm.  No murmurs, rubs, or gallops.  Respiratory: Normal respiratory effort without tachypnea nor retractions. Breath sounds are clear and equal bilaterally. No wheezes/rales/rhonchi. Gastrointestinal: Soft and non tender. No rebound. No guarding.  Genitourinary: Deferred Musculoskeletal: Normal range of motion in all extremities. No lower extremity edema. Neurologic:  Normal speech and language. No gross focal neurologic deficits are appreciated.  Skin:  Skin is warm, dry and intact. No rash noted. Psychiatric: Mood and affect are normal. Speech and behavior are  normal. Patient exhibits appropriate insight and judgment.  ____________________________________________    LABS (pertinent positives/negatives)  Trop <0.03 CBC wbc 17.3, hgb 16.2, plt 333 Ethanol <10 BMP na 144, k 3.7, glu 239, cr 1.71  ____________________________________________   EKG  I, Phineas Semen, attending physician, personally viewed and interpreted this EKG  EKG Time: 0337 Rate: 152 Rhythm: sinus tachycardia Axis: left axis deviation Intervals: qtc 427 QRS: RSR' in v1, v2 ST changes: no st elevation Impression: abnormal ekg   ____________________________________________    RADIOLOGY  None  ____________________________________________   PROCEDURES  Procedures  ____________________________________________   INITIAL IMPRESSION / ASSESSMENT AND PLAN / ED COURSE  Pertinent labs & imaging results that were available during my care of the patient were reviewed by me and considered in my medical decision making (see chart for details).   Patient presented to the emergency department tonight because of concern for alcohol withdrawal. He also did adderall today. On exam patient with significant tachycardia. Blood work also shows an AKI. Patient was given IVFs and ativan without significant improvement of tachycardia or symptoms. Patient also started complaining of hallucinations here in the emergency department. Will plan on admission.  ____________________________________________   FINAL CLINICAL IMPRESSION(S) / ED DIAGNOSES  Final diagnoses:  Alcohol withdrawal syndrome with complication (HCC)  AKI (acute kidney injury) (HCC)  Sinus tachycardia     Note: This dictation was prepared with Dragon dictation. Any transcriptional errors that result from this process are unintentional     Phineas Semen, MD 12/15/18 [phone]

## 2018-12-16 LAB — URINALYSIS, COMPLETE (UACMP) WITH MICROSCOPIC
Bacteria, UA: NONE SEEN
Bilirubin Urine: NEGATIVE
Glucose, UA: NEGATIVE mg/dL
Hgb urine dipstick: NEGATIVE
Ketones, ur: NEGATIVE mg/dL
Leukocytes,Ua: NEGATIVE
Nitrite: NEGATIVE
Protein, ur: NEGATIVE mg/dL
Specific Gravity, Urine: 1.02 (ref 1.005–1.030)
pH: 5 (ref 5.0–8.0)

## 2018-12-16 LAB — URINE DRUG SCREEN, QUALITATIVE (ARMC ONLY)
Amphetamines, Ur Screen: POSITIVE — AB
Barbiturates, Ur Screen: NOT DETECTED
Benzodiazepine, Ur Scrn: POSITIVE — AB
Cannabinoid 50 Ng, Ur ~~LOC~~: POSITIVE — AB
Cocaine Metabolite,Ur ~~LOC~~: NOT DETECTED
MDMA (Ecstasy)Ur Screen: NOT DETECTED
Methadone Scn, Ur: NOT DETECTED
Opiate, Ur Screen: NOT DETECTED
Phencyclidine (PCP) Ur S: NOT DETECTED
Tricyclic, Ur Screen: NOT DETECTED

## 2018-12-16 LAB — BASIC METABOLIC PANEL
Anion gap: 8 (ref 5–15)
BUN: 15 mg/dL (ref 6–20)
CO2: 24 mmol/L (ref 22–32)
Calcium: 8.3 mg/dL — ABNORMAL LOW (ref 8.9–10.3)
Chloride: 108 mmol/L (ref 98–111)
Creatinine, Ser: 0.84 mg/dL (ref 0.61–1.24)
GFR calc Af Amer: >60 mL/min (ref >60)
GFR calc non Af Amer: >60 mL/min (ref >60)
Glucose, Bld: 94 mg/dL (ref 70–99)
Potassium: 3.6 mmol/L (ref 3.5–5.1)
Sodium: 140 mmol/L (ref 135–145)

## 2018-12-16 LAB — CBC
HCT: 41 % (ref 39.0–52.0)
Hemoglobin: 14.2 g/dL (ref 13.0–17.0)
MCH: 35.4 pg — ABNORMAL HIGH (ref 26.0–34.0)
MCHC: 34.6 g/dL (ref 30.0–36.0)
MCV: 102.2 fL — ABNORMAL HIGH (ref 80.0–100.0)
Platelets: 216 10*3/uL (ref 150–400)
RBC: 4.01 MIL/uL — ABNORMAL LOW (ref 4.22–5.81)
RDW: 12.9 % (ref 11.5–15.5)
WBC: 7.3 10*3/uL (ref 4.0–10.5)
nRBC: 0 % (ref 0.0–0.2)

## 2018-12-16 LAB — C DIFFICILE QUICK SCREEN W PCR REFLEX
C Diff antigen: NEGATIVE
C Diff interpretation: NOT DETECTED
C Diff toxin: NEGATIVE

## 2018-12-16 LAB — HIV ANTIBODY (ROUTINE TESTING W REFLEX): HIV Screen 4th Generation wRfx: NONREACTIVE

## 2018-12-16 MED ORDER — CHLORDIAZEPOXIDE HCL 25 MG PO CAPS: 25.0000 mg | ORAL_CAPSULE | Freq: Three times a day (TID) | ORAL | Status: DC

## 2018-12-16 MED ORDER — CHLORDIAZEPOXIDE HCL 25 MG PO CAPS: ORAL_CAPSULE | ORAL | 0 refills | Status: DC

## 2018-12-16 MED ORDER — CHLORDIAZEPOXIDE HCL 25 MG PO CAPS: 25.0000 mg | ORAL_CAPSULE | ORAL | Status: DC

## 2018-12-16 MED ORDER — CHLORDIAZEPOXIDE HCL 25 MG PO CAPS: 25.0000 mg | ORAL_CAPSULE | Freq: Four times a day (QID) | ORAL | Status: DC

## 2018-12-16 MED ORDER — LORAZEPAM 1 MG PO TABS: 1.0000 mg | ORAL_TABLET | Freq: Every day | ORAL | 0 refills | Status: DC | PRN

## 2018-12-16 MED ORDER — CHLORDIAZEPOXIDE HCL 25 MG PO CAPS: 25.0000 mg | ORAL_CAPSULE | Freq: Every day | ORAL | Status: DC

## 2018-12-16 NOTE — Discharge Summary (Signed)
[facility] [place] [facility]   PATIENT NAME: Nicholas Valdez    MR#:  [id number]  DATE OF BIRTH:  13-Apr-1981  DATE OF ADMISSION:  12/15/2018   ADMITTING PHYSICIAN: Campbell Stall, MD  DATE OF DISCHARGE: 12/16/18  PRIMARY CARE PHYSICIAN: Patient, No Pcp Per   ADMISSION DIAGNOSIS:  Sinus tachycardia [R00.0] AKI (acute kidney injury) (HCC) [N17.9] Alcohol withdrawal syndrome with complication (HCC) [F10.239] DISCHARGE DIAGNOSIS:  Active Problems:   Alcohol withdrawal (HCC)  SECONDARY DIAGNOSIS:   Past Medical History:  Diagnosis Date  . Hepatitis C    HOSPITAL COURSE:   Nicholas Valdez is a 38 year old male who presented to the ED for assistance with alcohol withdrawal.  He is currently drinking a fifth of liquor every day.  He states he wants to quit alcohol.  In the ED, he was initially tachycardic with heart rates in the 130s.  He was also noted to have an AKI with a creatinine of 1.71.  He was admitted for further management.  Alcohol withdrawal- improved -Discharged home on Librium taper over the next week -Patient states he is planning on returning to AA and NA meetings on discharge  SIRS- patient meeting SIRS criteria on admission with tachycardia and leukocytosis.  No source of infection identified.  Likely all related to alcohol withdrawal. -UA, chest x-ray, lactic acid, procalcitonin, blood cultures were all negative  AKI- likely due to dehydration -Creatinine returned back to normal with IV fluids  Polysubstance abuse- known to use alcohol and IV Adderall. Also reports " sharing needles with sketchy people". -UDS positive for amphetamines, benzos, and marijuana -HIV negative -Patient states that he will seek substance abuse counseling when he leaves the hospital  History of hepatitis C- per chart review, does not look like patient has ever received treatment for this. -Hepatitis C RNA quant pending at the time of discharge -Needs to  follow-up with GI as an outpatient  Diarrhea- developed during hospitalization.  Likely related to alcohol and substance withdrawal. -C. difficile was negative -Diarrhea resolved on its own  DISCHARGE CONDITIONS:  Polysubstance abuse Chronic hepatitis C Diarrhea CONSULTS OBTAINED:  None DRUG ALLERGIES:   Allergies  Allergen Reactions  . Penicillins     Did it involve swelling of the face/tongue/throat, SOB, or low BP? Yes Did it involve sudden or severe rash/hives, skin peeling, or any reaction on the inside of your mouth or nose? Yes Did you need to seek medical attention at a hospital or doctor's office? Yes When did it last happen?childhood If all above answers are "NO", may proceed with cephalosporin use.  Marland Kitchen Zofran [Ondansetron Hcl]    DISCHARGE MEDICATIONS:   Allergies as of 12/16/2018      Reactions   Penicillins    Did it involve swelling of the face/tongue/throat, SOB, or low BP? Yes Did it involve sudden or severe rash/hives, skin peeling, or any reaction on the inside of your mouth or nose? Yes Did you need to seek medical attention at a hospital or doctor's office? Yes When did it last happen?childhood If all above answers are "NO", may proceed with cephalosporin use.   Zofran [ondansetron Hcl]       Medication List    TAKE these medications   acetaminophen 325 MG tablet Commonly known as:  TYLENOL Take 650 mg by mouth every 6 (six) hours as needed.   cholecalciferol 25 MCG (1000 UT) tablet Commonly known as:  VITAMIN D3 Take 2,000 Units by mouth daily.  LORazepam 1 MG tablet Commonly known as:  Ativan Take 1 tablet (1 mg total) by mouth daily as needed (alcohol withdrawal symptoms).   vitamin C 1000 MG tablet Take 1,000 mg by mouth daily.        DISCHARGE INSTRUCTIONS:  1.  Follow-up with PCP in 5 days 2.  Take Librium as prescribed for alcohol withdrawal DIET:  Regular diet DISCHARGE CONDITION:  Stable ACTIVITY:  Activity as  tolerated OXYGEN:  Home Oxygen: No.  Oxygen Delivery: room air DISCHARGE LOCATION:  home   If you experience worsening of your admission symptoms, develop shortness of breath, life threatening emergency, suicidal or homicidal thoughts you must seek medical attention immediately by calling 911 or calling your MD immediately  if symptoms less severe.  You Must read complete instructions/literature along with all the possible adverse reactions/side effects for all the Medicines you take and that have been prescribed to you. Take any new Medicines after you have completely understood and accpet all the possible adverse reactions/side effects.   Please note  You were cared for by a hospitalist during your hospital stay. If you have any questions about your discharge medications or the care you received while you were in the hospital after you are discharged, you can call the unit and asked to speak with the hospitalist on call if the hospitalist that took care of you is not available. Once you are discharged, your primary care physician will handle any further medical issues. Please note that NO REFILLS for any discharge medications will be authorized once you are discharged, as it is imperative that you return to your primary care physician (or establish a relationship with a primary care physician if you do not have one) for your aftercare needs so that they can reassess your need for medications and monitor your lab values.    On the day of Discharge:  VITAL SIGNS:  Blood pressure 128/90, pulse 75, temperature 97.9 F (36.6 C), temperature source Oral, resp. rate 20, height 6' (1.829 m), weight 113.4 kg, SpO2 97 %. PHYSICAL EXAMINATION:  GENERAL:  38 y.o.-year-old patient lying in the bed with no acute distress.  Mildly disheveled appearing. EYES: Pupils equal, round, reactive to light and accommodation. No scleral icterus. Extraocular muscles intact.  HEENT: Head atraumatic, normocephalic.  Oropharynx and nasopharynx clear.  NECK:  Supple, no jugular venous distention. No thyroid enlargement, no tenderness.  LUNGS: Normal breath sounds bilaterally, no wheezing, rales,rhonchi or crepitation. No use of accessory muscles of respiration.  CARDIOVASCULAR: S1, S2 normal. No murmurs, rubs, or gallops.  ABDOMEN: Soft, non-tender, non-distended. Bowel sounds present. No organomegaly or mass.  EXTREMITIES: No pedal edema, cyanosis, or clubbing.  NEUROLOGIC: Cranial nerves II through XII are intact. Muscle strength 5/5 in all extremities. Sensation intact. Gait not checked.  PSYCHIATRIC: The patient is alert and oriented x 3.  SKIN: No obvious rash, lesion, or ulcer.  DATA REVIEW:   CBC Recent Labs  Lab 12/16/18 0256  WBC 7.3  HGB 14.2  HCT 41.0  PLT 216    Chemistries  Recent Labs  Lab 12/16/18 0256  NA 140  K 3.6  CL 108  CO2 24  GLUCOSE 94  BUN 15  CREATININE 0.84  CALCIUM 8.3*     Microbiology Results  Results for orders placed or performed during the hospital encounter of 12/15/18  SARS Coronavirus 2 [facility](Hospital order, Performed in [facility] lab)     Status: None   Collection Time: 12/15/18  7:35  AM  Result Value Ref Range Status   SARS Coronavirus 2 NEGATIVE NEGATIVE Final    Comment: (NOTE) If result is NEGATIVE SARS-CoV-2 target nucleic acids are NOT DETECTED. The SARS-CoV-2 RNA is generally detectable in upper and lower  respiratory specimens during the acute phase of infection. The lowest  concentration of SARS-CoV-2 viral copies this assay can detect is 250  copies / mL. A negative result does not preclude SARS-CoV-2 infection  and should not be used as the sole basis for treatment or other  patient management decisions.  A negative result may occur with  improper specimen collection / handling, submission of specimen other  than nasopharyngeal swab, presence of viral mutation(s) within the  areas targeted by this assay, and inadequate  number of viral copies  (<250 copies / mL). A negative result must be combined with clinical  observations, patient history, and epidemiological information. If result is POSITIVE SARS-CoV-2 target nucleic acids are DETECTED. The SARS-CoV-2 RNA is generally detectable in upper and lower  respiratory specimens dur ing the acute phase of infection.  Positive  results are indicative of active infection with SARS-CoV-2.  Clinical  correlation with patient history and other diagnostic information is  necessary to determine patient infection status.  Positive results do  not rule out bacterial infection or co-infection with other viruses. If result is PRESUMPTIVE POSTIVE SARS-CoV-2 nucleic acids MAY BE PRESENT.   A presumptive positive result was obtained on the submitted specimen  and confirmed on repeat testing.  While 2019 novel coronavirus  (SARS-CoV-2) nucleic acids may be present in the submitted sample  additional confirmatory testing may be necessary for epidemiological  and / or clinical management purposes  to differentiate between  SARS-CoV-2 and other Sarbecovirus currently known to infect humans.  If clinically indicated additional testing with an alternate test  methodology [phone]) is advised. The SARS-CoV-2 RNA is generally  detectable in upper and lower respiratory sp ecimens during the acute  phase of infection. The expected result is Negative. Fact Sheet for Patients:  BoilerBrush.com.cy Fact Sheet for Healthcare Providers: https://pope.com/ This test is not yet approved or cleared by the [place] FDA and has been authorized for detection and/or diagnosis of SARS-CoV-2 by FDA under an Emergency Use Authorization (EUA).  This EUA will remain in effect (meaning this test can be used) for the duration of the COVID-19 declaration under Section 564(b)(1) of the Act, 21 U.S.C. section 360bbb-3(b)(1), unless the  authorization is terminated or revoked sooner. Performed at [facility], [address] [place], [place] [address]   CULTURE, BLOOD (ROUTINE X 2) w Reflex to ID Panel     Status: None (Preliminary result)   Collection Time: 12/15/18 12:11 PM  Result Value Ref Range Status   Specimen Description BLOOD RIGHT ANTECUBITAL  Final   Special Requests   Final    BOTTLES DRAWN AEROBIC AND ANAEROBIC Blood Culture adequate volume   Culture   Final    NO GROWTH < 24 HOURS Performed at [facility], [address] [place], [place] [address]    Report Status PENDING  Incomplete  CULTURE, BLOOD (ROUTINE X 2) w Reflex to ID Panel     Status: None (Preliminary result)   Collection Time: 12/15/18 12:20 PM  Result Value Ref Range Status   Specimen Description BLOOD BLOOD RIGHT HAND  Final   Special Requests   Final    BOTTLES DRAWN AEROBIC AND ANAEROBIC Blood Culture adequate volume   Culture  Final    NO GROWTH < 24 HOURS Performed at [facility], [address] [place], [place] [address]    Report Status PENDING  Incomplete    RADIOLOGY:  Dg Chest 1 View  Result Date: 12/15/2018 CLINICAL DATA:  Leukocytosis EXAM: CHEST  1 VIEW COMPARISON:  03/22/2011 FINDINGS: The heart size and mediastinal contours are within normal limits. Both lungs are clear. The visualized skeletal structures are unremarkable. IMPRESSION: No active disease. Electronically Signed   By: Alcide Clever M.D.   On: 12/15/2018 12:24     Management plans discussed with the patient, family and they are in agreement.  CODE STATUS: Full Code   TOTAL TIME TAKING CARE OF THIS PATIENT: 45 minutes.    Jinny Blossom Ramonda Galyon M.D on 12/16/2018 at 10:34 AM  Between 7am to 6pm - Pager - [phone]  After 6pm go to [email or web address] - Social research officer, government  [facility]  Office  [phone]  CC: Primary care physician; Patient, No Pcp Per   Note: This dictation was prepared with  Dragon dictation along with smaller phrase technology. Any transcriptional errors that result from this process are unintentional.

## 2018-12-16 NOTE — Discharge Instructions (Signed)
It was so nice to meet you during this hospitalization!  You came into the hospital with alcohol withdrawal.    I have prescribed the following medications: 1. You should take Librium for alcohol withdrawal -Take 1 tablet by mouth 4 times a day for 3 days -Take 1 tablet by mouth 3 times a day for 2 days -Take 1 tablet by mouth 2 times a day for 1 day -Take 1 tablet by mouth daily for 1 day 2. I have also prescribed some ativan to use as needed in addition to librium to use as needed for alcohol withdrawal symptoms.  Take care, Dr. Nancy Marus

## 2018-12-16 NOTE — Progress Notes (Addendum)
MD notified: The patient wanted to know if he could get additional ativan for trimers, anxiety. He had a dose of 1mg  of ativan given at 0558. He also wants to speak to you about possible going home yet I told him they are trying to rule out C.Diff right now.   Spoke with Dr. Nancy Marus. She approved for the patient to get 1mg  of ativan early for alcohol withdrawal symptoms.

## 2018-12-16 NOTE — Progress Notes (Signed)
The patient has been discharged. Educated about how to take discharged meds.

## 2018-12-16 NOTE — TOC Initial Note (Signed)
Transition of Care [facility]) - Initial/Assessment Note    Patient Details  Name: Nicholas Valdez MRN: [id number] Date of Birth: 1981/02/09  [facility] CM/SW Contact:    York Spaniel, LCSW Phone Number: 12/16/2018, 11:36 AM  Clinical Narrative:                Patient to be discharged to return home with his parents. CSW spoke with patient this morning regarding rehab outpatient resources but patient stated he was not interested. He stated he has been in and out of rehab and it never works. He also stated his parents have been lifelong addicts. CSW informed patient about his prescriptions and that 2 coupons would be provided if he wishes to use them.    Expected Discharge Plan: Home/Self Care Barriers to Discharge: No Barriers Identified   Patient Goals and CMS Choice        Expected Discharge Plan and Services Expected Discharge Plan: Home/Self Care       Living arrangements for the past 2 months: Single Family Home Expected Discharge Date: 12/16/18                                    Prior Living Arrangements/Services Living arrangements for the past 2 months: Single Family Home Lives with:: Parents Patient language and need for interpreter reviewed:: Yes Do you feel safe going back to the place where you live?: Yes      Need for Family Participation in Patient Care: No (Comment) Care giver support system in place?: No (comment)   Criminal Activity/Legal Involvement Pertinent to Current Situation/Hospitalization: Yes - Comment as needed  Activities of Daily Living Home Assistive Devices/Equipment: None ADL Screening (condition at time of admission) Patient's cognitive ability adequate to safely complete daily activities?: Yes 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 ) Does the patient have difficulty seeing, even when wearing glasses/contacts?: No Does the patient have difficulty concentrating,  remembering, or making decisions?: No Patient able to express need for assistance with ADLs?: Yes Does the patient have difficulty dressing or bathing?: No Independently performs ADLs?: Yes (appropriate for developmental age) Does the patient have difficulty walking or climbing stairs?: No Weakness of Legs: None Weakness of Arms/Hands: None  Permission Sought/Granted                  Emotional Assessment Appearance:: Appears older than stated age   Affect (typically observed): Frustrated, Blunt, In denial Orientation: : Oriented to Self, Oriented to Place, Oriented to  Time, Oriented to Situation Alcohol / Substance Use: Illicit Drugs Psych Involvement: No (comment)  Admission diagnosis:  Sinus tachycardia [R00.0] AKI (acute kidney injury) (HCC) [N17.9] Alcohol withdrawal syndrome with complication [facility]) [F10.239] Patient Active Problem List   Diagnosis Date Noted  . Alcohol withdrawal (HCC) 12/15/2018   PCP:  Patient, No Pcp Per Pharmacy:   [facility] [address] [place] - [address] [place] [address] Phone: [phone] Fax: [phone]  [facility] [address] ([address]), [place] - [address]) [place] [address] Phone: [phone] Fax: [phone]  [facility] [address] [place] - [address] [place] [address] Phone: [phone] Fax: [phone]     Social Determinants of Health (SDOH) Interventions    Readmission Risk Interventions Readmission Risk Prevention Plan  12/16/2018  Transportation Screening Complete  PCP or Specialist Appt within 5-7 Days Complete  Home Care Screening Complete  Medication Review (RN CM) Complete  Some recent data might be hidden

## 2018-12-17 LAB — URINE CULTURE: Culture: <10000 — AB

## 2018-12-20 LAB — CULTURE, BLOOD (ROUTINE X 2)
Culture: NO GROWTH
Culture: NO GROWTH
Special Requests: ADEQUATE
Special Requests: ADEQUATE

## 2018-12-24 LAB — HCV RNA (INTERNATIONAL UNITS)
HCV RNA (International Units): 18700000 IU/mL
HCV log10: 7.272 log10 IU/mL

## 2018-12-24 LAB — HCV RNA QUANT

## 2019-04-08 ENCOUNTER — Emergency Department
Admission: EM | Admit: 2019-04-08 | Discharge: 2019-04-08 | Disposition: A | Discharge status: Home / Self Care | Payer: BC Managed Care – PPO | Attending: Emergency Medicine | Admitting: Emergency Medicine

## 2019-04-08 ENCOUNTER — Encounter: Payer: Self-pay | Admitting: Emergency Medicine

## 2019-04-08 ENCOUNTER — Other Ambulatory Visit: Payer: Self-pay

## 2019-04-08 DIAGNOSIS — F17210 Nicotine dependence, cigarettes, uncomplicated: Secondary | ICD-10-CM | POA: Diagnosis not present

## 2019-04-08 DIAGNOSIS — M79642 Pain in left hand: Secondary | ICD-10-CM | POA: Diagnosis not present

## 2019-04-08 DIAGNOSIS — R202 Paresthesia of skin: Secondary | ICD-10-CM | POA: Diagnosis present

## 2019-04-08 DIAGNOSIS — M79641 Pain in right hand: Secondary | ICD-10-CM | POA: Diagnosis not present

## 2019-04-08 MED ORDER — PREDNISONE 10 MG PO TABS: ORAL_TABLET | ORAL | 0 refills | Status: DC

## 2019-04-08 MED ORDER — IBUPROFEN 600 MG PO TABS: 600.0000 mg | ORAL_TABLET | Freq: Four times a day (QID) | ORAL | 0 refills | Status: DC | PRN

## 2019-04-08 NOTE — ED Provider Notes (Signed)
[facility] Emergency Department Provider Note  ____________________________________________  Time seen: Approximately 12:18 PM  I have reviewed the triage vital signs and the nursing notes.   HISTORY  Chief Complaint No chief complaint on file.    HPI Nicholas Valdez is a 38 y.o. male presents to the emergency department for evaluation of bilateral hand pain, tingling, numbness, swelling for several days.  He is his hands all day at work on a [employer]  He is also a Probation officer and plays guitar.  Patient states that he has had this happen before and has taken prednisone and Percocet with improvement.  Patient was told by his primary care doctor that he may have arthritis.  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  No trauma.   Past Medical History:  Diagnosis Date  . Hepatitis C     Patient Active Problem List   Diagnosis Date Noted  . Alcohol withdrawal ([place]) 12/15/2018    History reviewed. No pertinent surgical history.  Prior to Admission medications   Medication Sig Start Date End Date Taking? Authorizing Provider  acetaminophen (TYLENOL) 325 MG tablet Take 650 mg by mouth every 6 (six) hours as needed.    [provider]  Ascorbic Acid (VITAMIN C) 1000 MG tablet Take 1,000 mg by mouth daily.    [provider]  cholecalciferol (VITAMIN D3) 25 MCG (1000 UT) tablet Take 2,000 Units by mouth daily.    [provider]  ibuprofen (ADVIL) 600 MG tablet Take 1 tablet (600 mg total) by mouth every 6 (six) hours as needed. 04/08/19   Laban Emperor, PA-C  predniSONE (DELTASONE) 10 MG tablet Take 6 tablets on day 1, take 5 tablets on day 2, take 4 tablets on day 3, take 3 tablets on day 4, take 2 tablets on day 5, take 1 tablet on day 6 04/08/19   Laban Emperor, PA-C    Allergies Penicillins and Zofran Alvis Lemmings hcl]  No family history on file.  Social History Social History   Tobacco Use  .  Smoking status: Current Every Day Smoker  . Smokeless tobacco: Never Used  Substance Use Topics  . Alcohol use: Yes  . Drug use: Yes    Types: Marijuana    Comment: oxycodone-- not prescribed     Review of Systems  Constitutional: No fever/chills Cardiovascular: No chest pain. Respiratory: No SOB. Gastrointestinal: No abdominal pain.  No nausea, no vomiting.  Musculoskeletal: Positive for hand pain.  No neck pain. Skin: Negative for rash, abrasions, lacerations, ecchymosis. Neurological: Negative for headaches   ____________________________________________   PHYSICAL EXAM:  VITAL SIGNS: ED Triage Vitals  Enc Vitals Group     BP 04/08/19 1126 (!) 141/91     Pulse Rate 04/08/19 1126 (!) 58     Resp 04/08/19 1126 20     Temp 04/08/19 1126 98.2 F (36.8 C)     Temp Source 04/08/19 1126 Oral     SpO2 04/08/19 1126 99 %     Weight 04/08/19 1128 240 lb (108.9 kg)     Height 04/08/19 1128 5\' 11"  (1.803 m)     Head Circumference --      Peak Flow --      Pain Score 04/08/19 1127 8     Pain Loc --      Pain Edu? --      Excl. in [place]? --      Constitutional: Alert and oriented.  Well appearing and in no acute distress. Eyes: Conjunctivae are normal. PERRL. EOMI. Head: Atraumatic. ENT:      Ears:      Nose: No congestion/rhinnorhea.      Mouth/Throat: Mucous membranes are moist.  Neck: No stridor.   Cardiovascular: Normal rate, regular rhythm.  Good peripheral circulation. Symmetric radial pulses bilaterally. Respiratory: Normal respiratory effort without tachypnea or retractions. Lungs CTAB. Good air entry to the bases with no decreased or absent breath sounds. Musculoskeletal: Full range of motion to all extremities. No gross deformities appreciated.  No visible swelling or erythema. Grip strength intact. Positive Tinel's and Phalen's. Neurologic:  Normal speech and language. No gross focal neurologic deficits are appreciated.  Skin:  Skin is warm, dry and intact. No  rash noted. Psychiatric: Mood and affect are normal. Speech and behavior are normal. Patient exhibits appropriate insight and judgement.   ____________________________________________   LABS (all labs ordered are listed, but only abnormal results are displayed)  Labs Reviewed - No data to display ____________________________________________  EKG   ____________________________________________  RADIOLOGY   No results found.  ____________________________________________    PROCEDURES  Procedure(s) performed:    Procedures    Medications - No data to display   ____________________________________________   INITIAL IMPRESSION / ASSESSMENT AND PLAN / ED COURSE  Pertinent labs & imaging results that were available during my care of the patient were reviewed by me and considered in my medical decision making (see chart for details).  Review of the [place] CSRS was performed in accordance of the [facility] prior to dispensing any controlled drugs.   Patient presented emergency department for evaluation of bilateral hand pain.  Symptoms are consistent with carpal tunnel.  Patient will be discharged home with prescriptions for prednisone and Motrin. Patient is to follow up with hand orthopedics as directed.  Referral was given Dr. Stephenie Acres.  Patient is given ED precautions to return to the ED for any worsening or new symptoms.  Nicholas Valdez was evaluated in Emergency Department on 04/08/2019 for the symptoms described in the history of present illness. He was evaluated in the context of the global COVID-19 pandemic, which necessitated consideration that the patient might be at risk for infection with the SARS-CoV-2 virus that causes COVID-19. Institutional protocols and algorithms that pertain to the evaluation of patients at risk for COVID-19 are in a state of rapid change based on information released by regulatory bodies including the CDC and federal and state organizations. These  policies and algorithms were followed during the patient's care in the ED.  ____________________________________________  FINAL CLINICAL IMPRESSION(S) / ED DIAGNOSES  Final diagnoses:  Bilateral hand pain      NEW MEDICATIONS STARTED DURING THIS VISIT:  ED Discharge Orders         Ordered    predniSONE (DELTASONE) 10 MG tablet     04/08/19 1225    ibuprofen (ADVIL) 600 MG tablet  Every 6 hours PRN     04/08/19 1225              This chart was dictated using voice recognition software/Dragon. Despite best efforts to proofread, errors can occur which can change the meaning. Any change was purely unintentional.    Enid Derry, PA-C 04/08/19 1352    Emily Filbert, MD 04/08/19 [phone]

## 2019-04-08 NOTE — ED Triage Notes (Signed)
Says hands swollen

## 2019-04-08 NOTE — ED Notes (Signed)
See triage note   Presents with swelling to both hands   Also has had some pain and tingling to hands  States this has happened to him in past   But states he has hep c and hx of drug [place]  But has been in recovery  States he was given prednisone and some percocet in past for same

## 2019-04-08 NOTE — Discharge Instructions (Signed)
Please take Motrin and prednisone for inflammation and swelling.  Use your compression stockings.  Please make an appointment with the hand doctor for follow-up.

## 2019-04-08 NOTE — ED Triage Notes (Signed)
Pt c/o bilat hand swelling and tingling since yesterday. No swelling noted. Denies any allergies or HF. VSS

## 2019-05-15 ENCOUNTER — Encounter: Payer: Self-pay | Admitting: Emergency Medicine

## 2019-05-15 ENCOUNTER — Other Ambulatory Visit: Payer: Self-pay

## 2019-05-15 ENCOUNTER — Emergency Department
Admission: EM | Admit: 2019-05-15 | Discharge: 2019-05-15 | Disposition: A | Discharge status: Home / Self Care | Payer: BC Managed Care – PPO | Attending: Emergency Medicine | Admitting: Emergency Medicine

## 2019-05-15 DIAGNOSIS — F10129 Alcohol abuse with intoxication, unspecified: Secondary | ICD-10-CM | POA: Insufficient documentation

## 2019-05-15 DIAGNOSIS — F17210 Nicotine dependence, cigarettes, uncomplicated: Secondary | ICD-10-CM | POA: Insufficient documentation

## 2019-05-15 DIAGNOSIS — J029 Acute pharyngitis, unspecified: Secondary | ICD-10-CM | POA: Diagnosis not present

## 2019-05-15 DIAGNOSIS — Z79899 Other long term (current) drug therapy: Secondary | ICD-10-CM | POA: Diagnosis not present

## 2019-05-15 DIAGNOSIS — Y908 Blood alcohol level of 240 mg/100 ml or more: Secondary | ICD-10-CM | POA: Diagnosis not present

## 2019-05-15 DIAGNOSIS — F1010 Alcohol abuse, uncomplicated: Secondary | ICD-10-CM

## 2019-05-15 LAB — BASIC METABOLIC PANEL
Anion gap: 14 (ref 5–15)
BUN: 8 mg/dL (ref 6–20)
CO2: 22 mmol/L (ref 22–32)
Calcium: 8.4 mg/dL — ABNORMAL LOW (ref 8.9–10.3)
Chloride: 103 mmol/L (ref 98–111)
Creatinine, Ser: 0.89 mg/dL (ref 0.61–1.24)
GFR calc Af Amer: >60 mL/min (ref >60)
GFR calc non Af Amer: >60 mL/min (ref >60)
Glucose, Bld: 121 mg/dL — ABNORMAL HIGH (ref 70–99)
Potassium: 3.5 mmol/L (ref 3.5–5.1)
Sodium: 139 mmol/L (ref 135–145)

## 2019-05-15 LAB — HEPATIC FUNCTION PANEL
ALT: 89 U/L — ABNORMAL HIGH (ref 0–44)
AST: 119 U/L — ABNORMAL HIGH (ref 15–41)
Albumin: 4.1 g/dL (ref 3.5–5.0)
Alkaline Phosphatase: 69 U/L (ref 38–126)
Bilirubin, Direct: 0.1 mg/dL (ref 0.0–0.2)
Indirect Bilirubin: 0.6 mg/dL (ref 0.3–0.9)
Total Bilirubin: 0.7 mg/dL (ref 0.3–1.2)
Total Protein: 7.6 g/dL (ref 6.5–8.1)

## 2019-05-15 LAB — CBC WITH DIFFERENTIAL/PLATELET
Abs Immature Granulocytes: 0.01 10*3/uL (ref 0.00–0.07)
Basophils Absolute: 0.1 10*3/uL (ref 0.0–0.1)
Basophils Relative: 1 %
Eosinophils Absolute: 0.1 10*3/uL (ref 0.0–0.5)
Eosinophils Relative: 1 %
HCT: 43.5 % (ref 39.0–52.0)
Hemoglobin: 15.6 g/dL (ref 13.0–17.0)
Immature Granulocytes: 0 %
Lymphocytes Relative: 38 %
Lymphs Abs: 3.1 10*3/uL (ref 0.7–4.0)
MCH: 33.3 pg (ref 26.0–34.0)
MCHC: 35.9 g/dL (ref 30.0–36.0)
MCV: 92.8 fL (ref 80.0–100.0)
Monocytes Absolute: 0.7 10*3/uL (ref 0.1–1.0)
Monocytes Relative: 8 %
Neutro Abs: 4.2 10*3/uL (ref 1.7–7.7)
Neutrophils Relative %: 52 %
Platelets: 195 10*3/uL (ref 150–400)
RBC: 4.69 MIL/uL (ref 4.22–5.81)
RDW: 13.6 % (ref 11.5–15.5)
WBC: 8.1 10*3/uL (ref 4.0–10.5)
nRBC: 0 % (ref 0.0–0.2)

## 2019-05-15 LAB — ETHANOL: Alcohol, Ethyl (B): 261 mg/dL — ABNORMAL HIGH (ref <10)

## 2019-05-15 LAB — AMMONIA: Ammonia: 18 umol/L (ref 9–35)

## 2019-05-15 MED ORDER — CHLORDIAZEPOXIDE HCL 25 MG PO CAPS: 25.0000 mg | ORAL_CAPSULE | Freq: Three times a day (TID) | ORAL | 0 refills | Status: DC | PRN

## 2019-05-15 MED ORDER — LORAZEPAM 2 MG/ML IJ SOLN
1.0000 mg | Freq: Once | INTRAMUSCULAR | Status: AC
  Administered 2019-05-15: 1 mg via INTRAVENOUS
  Filled 2019-05-15: qty 1

## 2019-05-15 MED ORDER — SODIUM CHLORIDE 0.9 % IV BOLUS
1000.0000 mL | Freq: Once | INTRAVENOUS | Status: AC
  Administered 2019-05-15: 1000 mL via INTRAVENOUS

## 2019-05-15 NOTE — ED Triage Notes (Signed)
States he has sore throat x 1 week. Also states he wants librium and ativan for alcohol detox. States his last drink was 35 minutes ago. States wants help to stop drinking.

## 2019-05-15 NOTE — Discharge Instructions (Signed)
Take the Librium as prescribed.  Return to the ER for new, worsening, or persistent anxiety, shakes, hallucinations, weakness, vomiting, other withdrawal symptoms, or any other new or worsening symptoms that concern you.  Follow-up with your regular doctor.

## 2019-05-15 NOTE — ED Provider Notes (Signed)
[facility] Emergency Department Provider Note ____________________________________________   First MD Initiated Contact with Patient 05/15/19 1839     (approximate)  I have reviewed the triage vital signs and the nursing notes.   HISTORY  Chief Complaint detox and Sore Throat    HPI Nicholas LAVERDURE is a 38 y.o. male with PMH as noted below including alcohol abuse and hepatitis C who presents with a chief complaint of alcohol abuse.  The patient states that he was drinking until this afternoon.  He feels that he will have withdrawal symptoms unless he detoxes.  However, he is adamant that he does not want to go to [facility] or be admitted for detox, but would like to "detox at home."  He requests Ativan and Librium.  The patient denies other drug use.  He denies any suicidal or homicidal ideation.  He reports some sore throat over the last several days but denies fever or cough.  Past Medical History:  Diagnosis Date  . Hepatitis C     Patient Active Problem List   Diagnosis Date Noted  . Alcohol withdrawal ([facility]) 12/15/2018    History reviewed. No pertinent surgical history.  Prior to Admission medications   Medication Sig Start Date End Date Taking? Authorizing Provider  acetaminophen (TYLENOL) 325 MG tablet Take 650 mg by mouth every 6 (six) hours as needed.    [provider]  Ascorbic Acid (VITAMIN C) 1000 MG tablet Take 1,000 mg by mouth daily.    [provider]  chlordiazePOXIDE (LIBRIUM) 25 MG capsule Take 1 capsule (25 mg total) by mouth 3 (three) times daily as needed for anxiety. 05/15/19   Dionne Bucy, MD  cholecalciferol (VITAMIN D3) 25 MCG (1000 UT) tablet Take 2,000 Units by mouth daily.    [provider]  ibuprofen (ADVIL) 600 MG tablet Take 1 tablet (600 mg total) by mouth every 6 (six) hours as needed. 04/08/19   Enid Derry, PA-C  predniSONE (DELTASONE) 10 MG tablet Take 6 tablets on day 1, take 5  tablets on day 2, take 4 tablets on day 3, take 3 tablets on day 4, take 2 tablets on day 5, take 1 tablet on day 6 04/08/19   Enid Derry, PA-C    Allergies Penicillins and Zofran Frazier Richards hcl]  No family history on file.  Social History Social History   Tobacco Use  . Smoking status: Current Every Day Smoker  . Smokeless tobacco: Never Used  Substance Use Topics  . Alcohol use: Yes  . Drug use: Yes    Types: Marijuana    Comment: oxycodone-- not prescribed    Review of Systems  Constitutional: No fever. Eyes: No redness. ENT: Positive for sore throat. Cardiovascular: Denies chest pain. Respiratory: Denies shortness of breath. Gastrointestinal: No vomiting. Genitourinary: Negative for dysuria.  Musculoskeletal: Negative for back pain. Skin: Negative for rash. Neurological: Negative for headache.   ____________________________________________   PHYSICAL EXAM:  VITAL SIGNS: ED Triage Vitals  Enc Vitals Group     BP 05/15/19 1754 (!) 164/104     Pulse Rate 05/15/19 1754 (!) 128     Resp 05/15/19 1754 20     Temp 05/15/19 1754 98.9 F (37.2 C)     Temp Source 05/15/19 1754 Oral     SpO2 05/15/19 1754 97 %     Weight 05/15/19 1759 240 lb (108.9 kg)     Height 05/15/19 1759 6' (1.829 m)     Head Circumference --  Peak Flow --      Pain Score 05/15/19 1759 0     Pain Loc --      Pain Edu? --      Excl. in [place]? --     Constitutional: Alert and oriented.  Relatively well appearing and in no acute distress. Eyes: Conjunctivae are normal.  EOMI. Head: Atraumatic. Nose: No congestion/rhinnorhea. Mouth/Throat: Mucous membranes are moist.  Oropharynx erythematous with no exudates.  No significant tonsillar swelling.  No anterior cervical lymphadenopathy. Neck: Normal range of motion.  Cardiovascular: Tachycardic, regular rhythm. Good peripheral circulation. Respiratory: Normal respiratory effort.  No retractions.  Gastrointestinal: No distention.   Musculoskeletal: No lower extremity edema.  Extremities warm and well perfused.  Neurologic:  Normal speech and language.  Motor intact in all extremities.  No gross focal neurologic deficits are appreciated.  Skin:  Skin is warm and dry. No rash noted. Psychiatric: Mood and affect are normal. Speech and behavior are normal.  ____________________________________________   LABS (all labs ordered are listed, but only abnormal results are displayed)  Labs Reviewed  BASIC METABOLIC PANEL - Abnormal; Notable for the following components:      Result Value   Glucose, Bld 121 (*)    Calcium 8.4 (*)    All other components within normal limits  HEPATIC FUNCTION PANEL - Abnormal; Notable for the following components:   AST 119 (*)    ALT 89 (*)    All other components within normal limits  ETHANOL - Abnormal; Notable for the following components:   Alcohol, Ethyl (B) 261 (*)    All other components within normal limits  CBC WITH DIFFERENTIAL/PLATELET  AMMONIA   ____________________________________________  EKG   ____________________________________________  RADIOLOGY    ____________________________________________   PROCEDURES  Procedure(s) performed: No  Procedures  Critical Care performed: No ____________________________________________   INITIAL IMPRESSION / ASSESSMENT AND PLAN / ED COURSE  Pertinent labs & imaging results that were available during my care of the patient were reviewed by me and considered in my medical decision making (see chart for details).  38 year old male with a history of alcohol abuse and hepatitis C presents stating he would like to detox of alcohol.  He has been drinking heavily in the last several days until this afternoon.  He denies drug use.  The patient states that he has successfully detoxed at home previously and states that he does not want to go to [facility] or other inpatient detox.  I reviewed the past medical records in epic.  In  May of this year the patient was seen with a similar presentation.  At that time he was found to be in alcohol withdrawal and AKI and was admitted medically.  On exam today the patient is alert and oriented x3.  He is calm and cooperative.  Although he has been drinking until this afternoon, at this time he is clinically sober and demonstrates appropriate understanding of his condition and full decision-making capacity.  He is hypertensive and tachycardic.  His other vital signs are normal.  There is no evidence of trauma.  Neurologic exam is nonfocal.  Overall presentation is consistent with early alcohol withdrawal.  I discussed with the patient my concerns over his tachycardia and the fact that he went into full withdrawal during his last presentation requiring admission.  He agreed to have US obtain labs, give IV fluids, and IV Ativan.  However, he states that he only wants to stay for an additional few hours and does  not want to be admitted.  ----------------------------------------- 10:10 PM on 05/15/2019 -----------------------------------------  The patient's lab work-up was reassuring.  He has no evidence of AKI and the other labs are within normal limits except for mildly elevated LFTs which is consistent with his history.  After fluids and Ativan, his heart rate went down to 102 and his blood pressure improved.  The patient continued to be alert and oriented x4.  He continued to demonstrate full understanding of his care, and appropriate decision-making capacity.  He continued to express preference to be discharged home and take p.o. medications, rather than go to a detox.  At this time he does not require inpatient admission.  I will give a small supply of Librium in order to help the patient prevent withdrawal.  I counseled him on its use.  I gave the patient thorough return precautions and he expressed understanding.  ____________________________________________   FINAL CLINICAL  IMPRESSION(S) / ED DIAGNOSES  Final diagnoses:  Alcohol abuse      NEW MEDICATIONS STARTED DURING THIS VISIT:  Discharge Medication List as of 05/15/2019  8:32 PM    START taking these medications   Details  chlordiazePOXIDE (LIBRIUM) 25 MG capsule Take 1 capsule (25 mg total) by mouth 3 (three) times daily as needed for anxiety., Starting Sat 05/15/2019, Normal         Note:  This document was prepared using Dragon voice recognition software and may include unintentional dictation errors.    Dionne Bucy, MD 05/15/19 2212

## 2019-07-26 ENCOUNTER — Encounter: Payer: Self-pay | Admitting: Radiology

## 2019-07-26 ENCOUNTER — Emergency Department: Payer: Medicaid Other

## 2019-07-26 ENCOUNTER — Other Ambulatory Visit: Payer: Self-pay

## 2019-07-26 ENCOUNTER — Emergency Department
Admission: EM | Admit: 2019-07-26 | Discharge: 2019-07-26 | Disposition: A | Discharge status: Home / Self Care | Payer: Medicaid Other | Attending: Emergency Medicine | Admitting: Emergency Medicine

## 2019-07-26 DIAGNOSIS — Y900 Blood alcohol level of less than 20 mg/100 ml: Secondary | ICD-10-CM | POA: Diagnosis not present

## 2019-07-26 DIAGNOSIS — F10930 Alcohol use, unspecified with withdrawal, uncomplicated: Secondary | ICD-10-CM

## 2019-07-26 DIAGNOSIS — F10230 Alcohol dependence with withdrawal, uncomplicated: Secondary | ICD-10-CM | POA: Insufficient documentation

## 2019-07-26 DIAGNOSIS — R079 Chest pain, unspecified: Secondary | ICD-10-CM | POA: Insufficient documentation

## 2019-07-26 DIAGNOSIS — Z79899 Other long term (current) drug therapy: Secondary | ICD-10-CM | POA: Diagnosis not present

## 2019-07-26 DIAGNOSIS — R0602 Shortness of breath: Secondary | ICD-10-CM | POA: Diagnosis present

## 2019-07-26 DIAGNOSIS — F17210 Nicotine dependence, cigarettes, uncomplicated: Secondary | ICD-10-CM | POA: Diagnosis not present

## 2019-07-26 DIAGNOSIS — R109 Unspecified abdominal pain: Secondary | ICD-10-CM | POA: Diagnosis not present

## 2019-07-26 DIAGNOSIS — E860 Dehydration: Secondary | ICD-10-CM | POA: Diagnosis not present

## 2019-07-26 LAB — CBC WITH DIFFERENTIAL/PLATELET
Abs Immature Granulocytes: 0.02 10*3/uL (ref 0.00–0.07)
Basophils Absolute: 0 10*3/uL (ref 0.0–0.1)
Basophils Relative: 1 %
Eosinophils Absolute: 0.1 10*3/uL (ref 0.0–0.5)
Eosinophils Relative: 2 %
HCT: 38.8 % — ABNORMAL LOW (ref 39.0–52.0)
Hemoglobin: 14.2 g/dL (ref 13.0–17.0)
Immature Granulocytes: 0 %
Lymphocytes Relative: 49 %
Lymphs Abs: 3.4 10*3/uL (ref 0.7–4.0)
MCH: 36.7 pg — ABNORMAL HIGH (ref 26.0–34.0)
MCHC: 36.6 g/dL — ABNORMAL HIGH (ref 30.0–36.0)
MCV: 100.3 fL — ABNORMAL HIGH (ref 80.0–100.0)
Monocytes Absolute: 0.8 10*3/uL (ref 0.1–1.0)
Monocytes Relative: 12 %
Neutro Abs: 2.5 10*3/uL (ref 1.7–7.7)
Neutrophils Relative %: 36 %
Platelets: 162 10*3/uL (ref 150–400)
RBC: 3.87 MIL/uL — ABNORMAL LOW (ref 4.22–5.81)
RDW: 14.4 % (ref 11.5–15.5)
WBC: 6.9 10*3/uL (ref 4.0–10.5)
nRBC: 0 % (ref 0.0–0.2)

## 2019-07-26 LAB — COMPREHENSIVE METABOLIC PANEL
ALT: 70 U/L — ABNORMAL HIGH (ref 0–44)
AST: 143 U/L — ABNORMAL HIGH (ref 15–41)
Albumin: 4.2 g/dL (ref 3.5–5.0)
Alkaline Phosphatase: 45 U/L (ref 38–126)
Anion gap: 16 — ABNORMAL HIGH (ref 5–15)
BUN: 11 mg/dL (ref 6–20)
CO2: 17 mmol/L — ABNORMAL LOW (ref 22–32)
Calcium: 8.2 mg/dL — ABNORMAL LOW (ref 8.9–10.3)
Chloride: 103 mmol/L (ref 98–111)
Creatinine, Ser: 1.02 mg/dL (ref 0.61–1.24)
GFR calc Af Amer: >60 mL/min (ref >60)
GFR calc non Af Amer: >60 mL/min (ref >60)
Glucose, Bld: 117 mg/dL — ABNORMAL HIGH (ref 70–99)
Potassium: 2.9 mmol/L — ABNORMAL LOW (ref 3.5–5.1)
Sodium: 136 mmol/L (ref 135–145)
Total Bilirubin: 1.2 mg/dL (ref 0.3–1.2)
Total Protein: 7.3 g/dL (ref 6.5–8.1)

## 2019-07-26 LAB — TROPONIN I (HIGH SENSITIVITY)
Troponin I (High Sensitivity): 7 ng/L (ref <18)
Troponin I (High Sensitivity): 4 ng/L (ref <18)

## 2019-07-26 LAB — LIPASE, BLOOD: Lipase: 52 U/L — ABNORMAL HIGH (ref 11–51)

## 2019-07-26 LAB — ETHANOL: Alcohol, Ethyl (B): <10 mg/dL (ref <10)

## 2019-07-26 MED ORDER — IOHEXOL 350 MG/ML SOLN
100.0000 mL | Freq: Once | INTRAVENOUS | Status: AC | PRN
  Administered 2019-07-26: 100 mL via INTRAVENOUS

## 2019-07-26 MED ORDER — CHLORDIAZEPOXIDE HCL 25 MG PO CAPS: ORAL_CAPSULE | ORAL | 0 refills | Status: AC

## 2019-07-26 MED ORDER — LACTATED RINGERS IV BOLUS
1000.0000 mL | Freq: Once | INTRAVENOUS | Status: AC
  Administered 2019-07-26: 1000 mL via INTRAVENOUS

## 2019-07-26 MED ORDER — LORAZEPAM 2 MG/ML IJ SOLN
1.0000 mg | Freq: Once | INTRAMUSCULAR | Status: AC
  Administered 2019-07-26: 1 mg via INTRAVENOUS
  Filled 2019-07-26: qty 1

## 2019-07-26 MED ORDER — SODIUM CHLORIDE 0.9% FLUSH: 3.0000 mL | Freq: Once | INTRAVENOUS | Status: DC

## 2019-07-26 MED ORDER — POTASSIUM CHLORIDE 20 MEQ/15ML (10%) PO SOLN
40.0000 meq | Freq: Once | ORAL | Status: AC
  Administered 2019-07-26: 40 meq via ORAL
  Filled 2019-07-26 (×2): qty 30

## 2019-07-26 MED ORDER — CHLORDIAZEPOXIDE HCL 25 MG PO CAPS: ORAL_CAPSULE | ORAL | 0 refills | Status: DC

## 2019-07-26 NOTE — ED Provider Notes (Signed)
[facility] Emergency Department Provider Note   ____________________________________________   First MD Initiated Contact with Patient 07/26/19 0424     (approximate)  I have reviewed the triage vital signs and the nursing notes.   HISTORY  Chief Complaint Shortness of Breath    HPI Nicholas Valdez is a 38 y.o. male with past medical history of hep C and alcohol abuse presents to the ED complaining of chest pain and shortness of breath.  Patient reports approximately 2 days of sharp pain in the left side of his chest extending into the left upper quadrant of his abdomen.  Pain is constant and exacerbated when he goes to take a deep breath.  He has not had any associated fevers, but because complain of coughing so much that he has been retching.  He otherwise denies any nausea or vomiting, and has not had any changes in his bowel movements.  He does admit to regular alcohol consumption, about a pint a day of hard liquor.  His last drink was a little over 24 hours ago, and he states he has been unable to drink due to his symptoms.  He reports feeling increasingly tremulous, similar to prior episodes of withdrawal.  He admits to a history of DTs, but denies any prior seizures when stopping drinking.        Past Medical History:  Diagnosis Date  . Hepatitis C     Patient Active Problem List   Diagnosis Date Noted  . Alcohol withdrawal ([facility]) 12/15/2018    No past surgical history on file.  Prior to Admission medications   Medication Sig Start Date End Date Taking? Authorizing Provider  acetaminophen (TYLENOL) 325 MG tablet Take 650 mg by mouth every 6 (six) hours as needed.    [provider]  Ascorbic Acid (VITAMIN C) 1000 MG tablet Take 1,000 mg by mouth daily.    [provider]  chlordiazePOXIDE (LIBRIUM) 25 MG capsule Take 1 capsule (25 mg total) by mouth 5 (five) times daily for 1 day, THEN 1 capsule (25 mg total) 4 (four) times  daily for 1 day, THEN 1 capsule (25 mg total) 3 (three) times daily for 1 day, THEN 1 capsule (25 mg total) 2 (two) times daily for 1 day, THEN 1 capsule (25 mg total) daily for 1 day. 07/26/19 07/31/19  Chesley Noon, MD  cholecalciferol (VITAMIN D3) 25 MCG (1000 UT) tablet Take 2,000 Units by mouth daily.    [provider]  ibuprofen (ADVIL) 600 MG tablet Take 1 tablet (600 mg total) by mouth every 6 (six) hours as needed. 04/08/19   Enid Derry, PA-C  predniSONE (DELTASONE) 10 MG tablet Take 6 tablets on day 1, take 5 tablets on day 2, take 4 tablets on day 3, take 3 tablets on day 4, take 2 tablets on day 5, take 1 tablet on day 6 04/08/19   Enid Derry, PA-C    Allergies Penicillins and Zofran Frazier Richards hcl]  No family history on file.  Social History Social History   Tobacco Use  . Smoking status: Current Every Day Smoker  . Smokeless tobacco: Never Used  Substance Use Topics  . Alcohol use: Yes  . Drug use: Yes    Types: Marijuana    Comment: oxycodone-- not prescribed    Review of Systems  Constitutional: No fever/chills Eyes: No visual changes. ENT: No sore throat. Cardiovascular: Positive for chest pain. Respiratory: Positive for cough and shortness of breath. Gastrointestinal: No  abdominal pain.  No nausea, no vomiting.  No diarrhea.  No constipation. Genitourinary: Negative for dysuria. Musculoskeletal: Negative for back pain. Skin: Negative for rash. Neurological: Negative for headaches, focal weakness or numbness.  ____________________________________________   PHYSICAL EXAM:  VITAL SIGNS: ED Triage Vitals [07/26/19 0402]  Enc Vitals Group     BP (!) 122/98     Pulse Rate (!) 129     Resp (!) 26     Temp 98 F (36.7 C)     Temp Source Oral     SpO2 100 %     Weight 240 lb (108.9 kg)     Height 6' (1.829 m)     Head Circumference      Peak Flow      Pain Score 7     Pain Loc      Pain Edu?      Excl. in [place]?      Constitutional: Alert and oriented. Eyes: Conjunctivae are normal. Head: Atraumatic. Nose: No congestion/rhinnorhea. Mouth/Throat: Mucous membranes are moist. Neck: Normal ROM Cardiovascular: Tachycardic, regular rhythm. Grossly normal heart sounds. Respiratory: Normal respiratory effort.  No retractions. Lungs CTAB. Gastrointestinal: Soft and tender to palpation in the epigastrium and left upper quadrant. No distention. Genitourinary: deferred Musculoskeletal: No lower extremity tenderness nor edema. Neurologic:  Normal speech and language. No gross focal neurologic deficits are appreciated. Skin:  Skin is warm, dry and intact. No rash noted. Psychiatric: Mood and affect are normal. Speech and behavior are normal.  ____________________________________________   LABS (all labs ordered are listed, but only abnormal results are displayed)  Labs Reviewed  CBC WITH DIFFERENTIAL/PLATELET - Abnormal; Notable for the following components:      Result Value   RBC 3.87 (*)    HCT 38.8 (*)    MCV 100.3 (*)    MCH 36.7 (*)    MCHC 36.6 (*)    All other components within normal limits  COMPREHENSIVE METABOLIC PANEL - Abnormal; Notable for the following components:   Potassium 2.9 (*)    CO2 17 (*)    Glucose, Bld 117 (*)    Calcium 8.2 (*)    AST 143 (*)    ALT 70 (*)    Anion gap 16 (*)    All other components within normal limits  LIPASE, BLOOD - Abnormal; Notable for the following components:   Lipase 52 (*)    All other components within normal limits  ETHANOL  TROPONIN I (HIGH SENSITIVITY)  TROPONIN I (HIGH SENSITIVITY)   ____________________________________________  EKG  ED ECG REPORT I, Chesley Noon, the attending physician, personally viewed and interpreted this ECG.   Date: 07/26/2019  EKG Time: 4:04  Rate: 118  Rhythm: sinus tachycardia  Axis: LAD  Intervals:Incomplete RBBB  ST&T Change: None   PROCEDURES  Procedure(s) performed (including Critical  Care):  Procedures   ____________________________________________   INITIAL IMPRESSION / ASSESSMENT AND PLAN / ED COURSE       38 year old male with history of alcohol abuse presents to the ED with 2 to 3 days of left-sided pleuritic chest pain associated with shortness of breath.  Given pleuritic component and tachycardia, I would be concerned for PE and we will assess with CTA of his chest.  He also has some tenderness in his abdomen and will extend our CT through his abdomen.  EKG shows sinus tachycardia but no acute ischemic changes, lower suspicion for ACS given atypical symptoms.  He does appear to be entering into alcohol  withdrawal, will give a dose of Ativan and IV fluids, plan to reassess.  CT is negative for PE and CT abdomen/pelvis is negative for acute process.  Patient's lab work shows mild alcoholic ketoacidosis but is otherwise unremarkable.  He appears much improved following fluid bolus and dose of Ativan.  We will give additional fluids and monitor for worsening withdrawal, but if he otherwise remained stable would be appropriate for outpatient detox.  Prescription was written for Librium taper and patient agrees with plan.      ____________________________________________   FINAL CLINICAL IMPRESSION(S) / ED DIAGNOSES  Final diagnoses:  Alcohol withdrawal syndrome without complication ([facility])  Chest pain, unspecified type  Dehydration     ED Discharge Orders         Ordered    chlordiazePOXIDE (LIBRIUM) 25 MG capsule     07/26/19 0753           Note:  This document was prepared using Dragon voice recognition software and may include unintentional dictation errors.   Blake Divine, MD 07/26/19 [phone]

## 2019-07-26 NOTE — ED Notes (Addendum)
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

## 2019-07-26 NOTE — ED Triage Notes (Signed)
Patient reports chest pain and shortness of breath.  Patient also reports he normally drinks a pint of alcohol daily and has not had any in almost 24 hours.

## 2019-09-09 ENCOUNTER — Other Ambulatory Visit: Payer: Self-pay

## 2019-09-09 ENCOUNTER — Encounter: Payer: Self-pay | Admitting: Emergency Medicine

## 2019-09-09 ENCOUNTER — Emergency Department: Payer: Medicaid Other

## 2019-09-09 ENCOUNTER — Emergency Department
Admission: EM | Admit: 2019-09-09 | Discharge: 2019-09-09 | Disposition: A | Discharge status: Home / Self Care | Payer: Medicaid Other | Attending: Emergency Medicine | Admitting: Emergency Medicine

## 2019-09-09 DIAGNOSIS — J209 Acute bronchitis, unspecified: Secondary | ICD-10-CM | POA: Insufficient documentation

## 2019-09-09 DIAGNOSIS — Z20822 Contact with and (suspected) exposure to covid-19: Secondary | ICD-10-CM | POA: Diagnosis not present

## 2019-09-09 DIAGNOSIS — B349 Viral infection, unspecified: Secondary | ICD-10-CM | POA: Diagnosis not present

## 2019-09-09 DIAGNOSIS — U071 COVID-19: Secondary | ICD-10-CM

## 2019-09-09 DIAGNOSIS — R0981 Nasal congestion: Secondary | ICD-10-CM | POA: Diagnosis present

## 2019-09-09 DIAGNOSIS — F17210 Nicotine dependence, cigarettes, uncomplicated: Secondary | ICD-10-CM | POA: Diagnosis not present

## 2019-09-09 DIAGNOSIS — Z79899 Other long term (current) drug therapy: Secondary | ICD-10-CM | POA: Diagnosis not present

## 2019-09-09 DIAGNOSIS — K529 Noninfective gastroenteritis and colitis, unspecified: Secondary | ICD-10-CM | POA: Diagnosis not present

## 2019-09-09 MED ORDER — PREDNISONE 20 MG PO TABS: 40.0000 mg | ORAL_TABLET | Freq: Every day | ORAL | 0 refills | Status: DC

## 2019-09-09 MED ORDER — METOCLOPRAMIDE HCL 10 MG PO TABS: 10.0000 mg | ORAL_TABLET | Freq: Four times a day (QID) | ORAL | 0 refills | Status: DC | PRN

## 2019-09-09 MED ORDER — ALBUTEROL SULFATE HFA 108 (90 BASE) MCG/ACT IN AERS: 2.0000 | INHALATION_SPRAY | RESPIRATORY_TRACT | 0 refills | Status: DC | PRN

## 2019-09-09 MED ORDER — FAMOTIDINE 20 MG PO TABS: 20.0000 mg | ORAL_TABLET | Freq: Two times a day (BID) | ORAL | 0 refills | Status: DC

## 2019-09-09 MED ORDER — METOCLOPRAMIDE HCL 10 MG PO TABS
10.0000 mg | ORAL_TABLET | Freq: Once | ORAL | Status: AC
  Administered 2019-09-09: 10 mg via ORAL
  Filled 2019-09-09: qty 1

## 2019-09-09 MED ORDER — ALUM & MAG HYDROXIDE-SIMETH 200-200-20 MG/5ML PO SUSP
30.0000 mL | Freq: Once | ORAL | Status: AC
  Administered 2019-09-09: 08:00:00 30 mL via ORAL
  Filled 2019-09-09: qty 30

## 2019-09-09 MED ORDER — FAMOTIDINE 20 MG PO TABS
40.0000 mg | ORAL_TABLET | Freq: Once | ORAL | Status: AC
  Administered 2019-09-09: 40 mg via ORAL
  Filled 2019-09-09: qty 2

## 2019-09-09 NOTE — ED Notes (Signed)
Pt able to tolerate fluids at this time. Pt given warm blanket.

## 2019-09-09 NOTE — ED Provider Notes (Signed)
[facility] Emergency Department Provider Note  ____________________________________________  Time seen: Approximately 8:09 AM  I have reviewed the triage vital signs and the nursing notes.   HISTORY  Chief Complaint Cough, Abdominal Pain, and Emesis    HPI Nicholas Valdez is a 39 y.o. male with a history of alcohol abuse and hepatitis C who comes the ED complaining of congestion, nonproductive cough, shortness of breath, chest tightness has been gradual onset, continuous, waxing and waning, no aggravating or alleviating factors, ongoing for the past week.  Chest tightness is not pleuritic.  Not exertional.  Denies fever but has some chills.  No body aches.  Endorses generalized abdominal pain with nausea vomiting and diarrhea since yesterday.  No black or bloody stool or hematemesis.  Reports that he is cut back on drinking but last drink 2 days ago, having 3 beers.   He reports multiple household members to have been sick recently with viral symptoms, although they have reportedly tested negative for Covid.    Past Medical History:  Diagnosis Date  . Hepatitis C      Patient Active Problem List   Diagnosis Date Noted  . Alcohol withdrawal ([place]) 12/15/2018     History reviewed. No pertinent surgical history.   Prior to Admission medications   Medication Sig Start Date End Date Taking? Authorizing Provider  acetaminophen (TYLENOL) 325 MG tablet Take 650 mg by mouth every 6 (six) hours as needed.    [provider]  albuterol (PROVENTIL HFA) 108 (90 Base) MCG/ACT inhaler Inhale 2 puffs into the lungs every 4 (four) hours as needed for wheezing or shortness of breath. 09/09/19   Carrie Mew, MD  Ascorbic Acid (VITAMIN C) 1000 MG tablet Take 1,000 mg by mouth daily.    [provider]  cholecalciferol (VITAMIN D3) 25 MCG (1000 UT) tablet Take 2,000 Units by mouth daily.    [provider]  famotidine (PEPCID) 20 MG  tablet Take 1 tablet (20 mg total) by mouth 2 (two) times daily. 09/09/19   Carrie Mew, MD  ibuprofen (ADVIL) 600 MG tablet Take 1 tablet (600 mg total) by mouth every 6 (six) hours as needed. 04/08/19   Laban Emperor, PA-C  metoCLOPramide (REGLAN) 10 MG tablet Take 1 tablet (10 mg total) by mouth every 6 (six) hours as needed. 09/09/19   Carrie Mew, MD  predniSONE (DELTASONE) 20 MG tablet Take 2 tablets (40 mg total) by mouth daily. 09/09/19   Carrie Mew, MD     Allergies Penicillins and Zofran Alvis Lemmings hcl]   No family history on file.  Social History Social History   Tobacco Use  . Smoking status: Current Every Day Smoker  . Smokeless tobacco: Never Used  Substance Use Topics  . Alcohol use: Yes  . Drug use: Yes    Types: Marijuana    Comment: oxycodone-- not prescribed    Review of Systems  Constitutional:   No fever or chills.  ENT:   No sore throat.  Positive congestion Cardiovascular:   Positive chest tightness without palpitations or syncope. Respiratory: Positive mild shortness of breath and nonproductive cough. Gastrointestinal:   Positive as above for abdominal pain, vomiting and diarrhea.  Musculoskeletal:   Negative for focal pain or swelling All other systems reviewed and are negative except as documented above in ROS and HPI.  ____________________________________________   PHYSICAL EXAM:  VITAL SIGNS: ED Triage Vitals  Enc Vitals Group     BP 09/09/19 0651 (!) 145/109  Pulse Rate 09/09/19 0651 (!) 110     Resp 09/09/19 0651 18     Temp 09/09/19 0651 97.6 F (36.4 C)     Temp Source 09/09/19 0651 Oral     SpO2 09/09/19 0651 97 %     Weight 09/09/19 0652 250 lb (113.4 kg)     Height 09/09/19 0652 5\' 11"  (1.803 m)     Head Circumference --      Peak Flow --      Pain Score 09/09/19 0652 5     Pain Loc --      Pain Edu? --      Excl. in [place]? --     Vital signs reviewed, nursing assessments reviewed.   Constitutional:    Alert and oriented. Non-toxic appearance. Eyes:   Conjunctivae are normal. EOMI. PERRL. ENT      Head:   Normocephalic and atraumatic.      Nose:   Wearing a mask.      Mouth/Throat:   Wearing a mask.      Neck:   No meningismus. Full ROM. Hematological/Lymphatic/Immunilogical:   No cervical lymphadenopathy. Cardiovascular:   RRR, heart rate 95. Symmetric bilateral radial and DP pulses.  No murmurs. Cap refill less than 2 seconds. Respiratory:   Normal respiratory effort without tachypnea/retractions. Breath sounds are clear and equal bilaterally.  Diffuse expiratory wheezing.  FEV1 maneuver provokes wheezing and cough. Gastrointestinal:   Soft with mild generalized tenderness, nonfocal. Non distended. There is no CVA tenderness.  No rebound, rigidity, or guarding.  Musculoskeletal:   Normal range of motion in all extremities. No joint effusions.  No lower extremity tenderness.  No edema. Neurologic:   Normal speech and language.  Motor grossly intact. No acute focal neurologic deficits are appreciated.  Skin:    Skin is warm, dry and intact. No rash noted.  No petechiae, purpura, or bullae.  ____________________________________________    LABS (pertinent positives/negatives) (all labs ordered are listed, but only abnormal results are displayed) Labs Reviewed  NOVEL CORONAVIRUS, NAA (HOSP ORDER, SEND-OUT TO REF LAB; TAT 18-24 HRS)   ____________________________________________   EKG    ____________________________________________    RADIOLOGY  DG Chest Portable 1 View  Result Date: 09/09/2019 CLINICAL DATA:  Cough, congestion, vomiting EXAM: PORTABLE CHEST 1 VIEW COMPARISON:  07/26/2019 FINDINGS: The heart size and mediastinal contours are within normal limits. Both lungs are clear. The visualized skeletal structures are unremarkable. IMPRESSION: No acute abnormality of the lungs in AP portable projection. Electronically Signed   By: 07/28/2019 M.D.   On: 09/09/2019 08:12     ____________________________________________   PROCEDURES Procedures  ____________________________________________  DIFFERENTIAL DIAGNOSIS   Viral syndrome, COVID-19, pneumonia, alcoholic gastritis  CLINICAL IMPRESSION / ASSESSMENT AND PLAN / ED COURSE  Medications ordered in the ED: Medications  metoCLOPramide (REGLAN) tablet 10 mg (10 mg Oral Given 09/09/19 0747)  alum & mag hydroxide-simeth (MAALOX/MYLANTA) 200-200-20 MG/5ML suspension 30 mL (30 mLs Oral Given 09/09/19 0747)  famotidine (PEPCID) tablet 40 mg (40 mg Oral Given 09/09/19 0747)    Pertinent labs & imaging results that were available during my care of the patient were reviewed by me and considered in my medical decision making (see chart for details).  MUKESH KORNEGAY was evaluated in Emergency Department on 09/09/2019 for the symptoms described in the history of present illness. He was evaluated in the context of the global COVID-19 pandemic, which necessitated consideration that the patient might be at risk for infection with  the SARS-CoV-2 virus that causes COVID-19. Institutional protocols and algorithms that pertain to the evaluation of patients at risk for COVID-19 are in a state of rapid change based on information released by regulatory bodies including the CDC and federal and state organizations. These policies and algorithms were followed during the patient's care in the ED.   Patient presents with constellation of symptoms consistent with a viral syndrome.  He has a Zofran allergy, so I will give Reglan Pepcid and Maalox for symptom relief, check a chest x-ray and send a Covid PCR to [facility].  He is nontoxic and well-appearing, not septic.  Ambulatory.  Would plan to discharge home with prescription for albuterol, Reglan, Pepcid and Covid quarantine precautions.  Clinical Course as of Sep 08 1006  Thu Sep 09, 2019  0808 Chest x-ray image viewed and interpreted by me, unremarkable.  No pneumonia or pneumothorax.   Normal mediastinum.  Symmetric diaphragms, no apparent rib injuries.   [PS]    Clinical Course User Index [PS] Sharman Cheek, MD     ----------------------------------------- 10:07 AM on 09/09/2019 -----------------------------------------  Chest x-ray radiology interpretation reviewed, unremarkable.  Vital signs remain normal.  Discharge home with prednisone and albuterol for bronchitis, Reglan and Pepcid for gastritis, which I think is all due to viral syndrome.  He is nontoxic with reassuring exam.  [facility] Covid test pending.  ____________________________________________   FINAL CLINICAL IMPRESSION(S) / ED DIAGNOSES    Final diagnoses:  Acute viral syndrome  Clinical diagnosis of COVID-19  Gastroenteritis  Bronchospasm with bronchitis, acute     ED Discharge Orders         Ordered    famotidine (PEPCID) 20 MG tablet  2 times daily     09/09/19 1004    metoCLOPramide (REGLAN) 10 MG tablet  Every 6 hours PRN     09/09/19 1004    albuterol (PROVENTIL HFA) 108 (90 Base) MCG/ACT inhaler  Every 4 hours PRN     09/09/19 1004    predniSONE (DELTASONE) 20 MG tablet  Daily     09/09/19 1006          Portions of this note were generated with dragon dictation software. Dictation errors may occur despite best attempts at proofreading.   Sharman Cheek, MD 09/09/19 1008

## 2019-09-09 NOTE — Discharge Instructions (Signed)
Your chest xray today was okay.  Take medications as prescribed to help with your symptoms of bronchitis and stomach upset.  We started a Covid test today, and the result should be available in MyChart in 1-2 days.  We will call you if it is positive.  In the meantime, you should isolate at home to avoid getting others sick.

## 2019-09-09 NOTE — ED Triage Notes (Signed)
Patient ambulatory to triage with steady gait, without difficulty or distress noted, mask in place; pt reports prod cough clear sputum with congestion x wk; N/V/D since yesterday with rt lower abd pain

## 2019-09-10 LAB — NOVEL CORONAVIRUS, NAA (HOSP ORDER, SEND-OUT TO REF LAB; TAT 18-24 HRS): SARS-CoV-2, NAA: NOT DETECTED

## 2019-09-13 ENCOUNTER — Encounter: Payer: Self-pay | Admitting: Emergency Medicine

## 2019-09-13 ENCOUNTER — Emergency Department
Admission: EM | Admit: 2019-09-13 | Discharge: 2019-09-13 | Disposition: A | Discharge status: Home / Self Care | Payer: Medicaid Other | Attending: Emergency Medicine | Admitting: Emergency Medicine

## 2019-09-13 ENCOUNTER — Emergency Department: Payer: Medicaid Other

## 2019-09-13 ENCOUNTER — Other Ambulatory Visit: Payer: Self-pay

## 2019-09-13 DIAGNOSIS — Z79899 Other long term (current) drug therapy: Secondary | ICD-10-CM | POA: Insufficient documentation

## 2019-09-13 DIAGNOSIS — F17210 Nicotine dependence, cigarettes, uncomplicated: Secondary | ICD-10-CM | POA: Diagnosis not present

## 2019-09-13 DIAGNOSIS — T50905A Adverse effect of unspecified drugs, medicaments and biological substances, initial encounter: Secondary | ICD-10-CM | POA: Insufficient documentation

## 2019-09-13 DIAGNOSIS — R1900 Intra-abdominal and pelvic swelling, mass and lump, unspecified site: Secondary | ICD-10-CM | POA: Diagnosis not present

## 2019-09-13 DIAGNOSIS — R0602 Shortness of breath: Secondary | ICD-10-CM | POA: Insufficient documentation

## 2019-09-13 DIAGNOSIS — R2243 Localized swelling, mass and lump, lower limb, bilateral: Secondary | ICD-10-CM | POA: Diagnosis present

## 2019-09-13 DIAGNOSIS — R609 Edema, unspecified: Secondary | ICD-10-CM | POA: Diagnosis not present

## 2019-09-13 LAB — COMPREHENSIVE METABOLIC PANEL
ALT: 64 U/L — ABNORMAL HIGH (ref 0–44)
AST: 85 U/L — ABNORMAL HIGH (ref 15–41)
Albumin: 3.8 g/dL (ref 3.5–5.0)
Alkaline Phosphatase: 48 U/L (ref 38–126)
Anion gap: 9 (ref 5–15)
BUN: 11 mg/dL (ref 6–20)
CO2: 27 mmol/L (ref 22–32)
Calcium: 9.5 mg/dL (ref 8.9–10.3)
Chloride: 100 mmol/L (ref 98–111)
Creatinine, Ser: 0.88 mg/dL (ref 0.61–1.24)
GFR calc Af Amer: >60 mL/min (ref >60)
GFR calc non Af Amer: >60 mL/min (ref >60)
Glucose, Bld: 101 mg/dL — ABNORMAL HIGH (ref 70–99)
Potassium: 3.4 mmol/L — ABNORMAL LOW (ref 3.5–5.1)
Sodium: 136 mmol/L (ref 135–145)
Total Bilirubin: 0.7 mg/dL (ref 0.3–1.2)
Total Protein: 7.1 g/dL (ref 6.5–8.1)

## 2019-09-13 LAB — CBC
HCT: 39.2 % (ref 39.0–52.0)
Hemoglobin: 14.3 g/dL (ref 13.0–17.0)
MCH: 37.8 pg — ABNORMAL HIGH (ref 26.0–34.0)
MCHC: 36.5 g/dL — ABNORMAL HIGH (ref 30.0–36.0)
MCV: 103.7 fL — ABNORMAL HIGH (ref 80.0–100.0)
Platelets: 266 10*3/uL (ref 150–400)
RBC: 3.78 MIL/uL — ABNORMAL LOW (ref 4.22–5.81)
RDW: 13.8 % (ref 11.5–15.5)
WBC: 10.2 10*3/uL (ref 4.0–10.5)
nRBC: 0.4 % — ABNORMAL HIGH (ref 0.0–0.2)

## 2019-09-13 LAB — TROPONIN I (HIGH SENSITIVITY): Troponin I (High Sensitivity): 3 ng/L (ref <18)

## 2019-09-13 LAB — BRAIN NATRIURETIC PEPTIDE: B Natriuretic Peptide: 25 pg/mL (ref 0.0–100.0)

## 2019-09-13 NOTE — ED Provider Notes (Signed)
[facility] Emergency Department Provider Note   ____________________________________________    I have reviewed the triage vital signs and the nursing notes.   HISTORY  Chief Complaint Leg Swelling     HPI Nicholas Valdez is a 39 y.o. male who presents with complaints of swelling in his feet as well as some bloating sensation in his abdomen.  Patient reports he was recently started on prednisone for bronchitis, took his last dose last night.  Denies shortness of breath.  No chest pain.  No nausea or vomiting.  Has never had this before.  Has not take anything for this.  No abdominal pain.  No chest pain.  No pleurisy  Past Medical History:  Diagnosis Date  . Hepatitis C     Patient Active Problem List   Diagnosis Date Noted  . Alcohol withdrawal ([facility]) 12/15/2018    History reviewed. No pertinent surgical history.  Prior to Admission medications   Medication Sig Start Date End Date Taking? Authorizing Provider  acetaminophen (TYLENOL) 325 MG tablet Take 650 mg by mouth every 6 (six) hours as needed.    [provider]  albuterol (PROVENTIL HFA) 108 (90 Base) MCG/ACT inhaler Inhale 2 puffs into the lungs every 4 (four) hours as needed for wheezing or shortness of breath. 09/09/19   Sharman Cheek, MD  Ascorbic Acid (VITAMIN C) 1000 MG tablet Take 1,000 mg by mouth daily.    [provider]  cholecalciferol (VITAMIN D3) 25 MCG (1000 UT) tablet Take 2,000 Units by mouth daily.    [provider]  famotidine (PEPCID) 20 MG tablet Take 1 tablet (20 mg total) by mouth 2 (two) times daily. 09/09/19   Sharman Cheek, MD  ibuprofen (ADVIL) 600 MG tablet Take 1 tablet (600 mg total) by mouth every 6 (six) hours as needed. 04/08/19   Enid Derry, PA-C  metoCLOPramide (REGLAN) 10 MG tablet Take 1 tablet (10 mg total) by mouth every 6 (six) hours as needed. 09/09/19   Sharman Cheek, MD  predniSONE (DELTASONE) 20 MG tablet Take  2 tablets (40 mg total) by mouth daily. 09/09/19   Sharman Cheek, MD     Allergies Penicillins and Zofran Frazier Richards hcl]  History reviewed. No pertinent family history.  Social History Social History   Tobacco Use  . Smoking status: Current Every Day Smoker  . Smokeless tobacco: Never Used  Substance Use Topics  . Alcohol use: Yes  . Drug use: Yes    Types: Marijuana    Comment: oxycodone-- not prescribed    Review of Systems  Constitutional: No fever/chills Eyes: No visual changes.  ENT: No throat swelling Cardiovascular: Denies chest pain. Respiratory: No difficulty breathing Gastrointestinal: As above Genitourinary: Negative for dysuria. Musculoskeletal: As above Skin: Negative for rash. Neurological: Negative for headaches or weakness   ____________________________________________   PHYSICAL EXAM:  VITAL SIGNS: ED Triage Vitals  Enc Vitals Group     BP 09/13/19 1553 (!) 126/91     Pulse Rate 09/13/19 1553 (!) 110     Resp 09/13/19 1553 20     Temp 09/13/19 1553 98.3 F (36.8 C)     Temp Source 09/13/19 1553 Oral     SpO2 09/13/19 1553 100 %     Weight 09/13/19 1605 128 kg (282 lb 3 oz)     Height --      Head Circumference --      Peak Flow --      Pain Score 09/13/19  1552 0     Pain Loc --      Pain Edu? --      Excl. in [place]? --     Constitutional: Alert and oriented. No acute distress. Pleasant and interactive  Nose: No congestion/rhinnorhea. Mouth/Throat: Mucous membranes are moist.    Cardiovascular: Normal rate, regular rhythm. Grossly normal heart sounds.  Good peripheral circulation. Respiratory: Normal respiratory effort.  No retractions. Lungs CTAB. Gastrointestinal: Soft and nontender. No distention.  No CVA tenderness.  No abdominal wall edema noted Genitourinary: deferred Musculoskeletal: 1+ edema to the feet bilaterally, warm and well perfused.  No edema in the upper extremities. Neurologic:  Normal speech and language. No  gross focal neurologic deficits are appreciated.  Skin:  Skin is warm, dry and intact. No rash noted. Psychiatric: Mood and affect are normal. Speech and behavior are normal.  ____________________________________________   LABS (all labs ordered are listed, but only abnormal results are displayed)  Labs Reviewed  CBC - Abnormal; Notable for the following components:      Result Value   RBC 3.78 (*)    MCV 103.7 (*)    MCH 37.8 (*)    MCHC 36.5 (*)    nRBC 0.4 (*)    All other components within normal limits  COMPREHENSIVE METABOLIC PANEL - Abnormal; Notable for the following components:   Potassium 3.4 (*)    Glucose, Bld 101 (*)    AST 85 (*)    ALT 64 (*)    All other components within normal limits  BRAIN NATRIURETIC PEPTIDE  TROPONIN I (HIGH SENSITIVITY)  TROPONIN I (HIGH SENSITIVITY)   ____________________________________________  EKG  ED ECG REPORT I, Lavonia Drafts, the attending physician, personally viewed and interpreted this ECG.  Date: 09/13/2019  Rhythm: Sinus tachycardia QRS Axis: normal Intervals: normal ST/T Wave abnormalities: normal Narrative Interpretation: no evidence of acute ischemia   ____________________________________________  RADIOLOGY  Chest x-ray unremarkable ____________________________________________   PROCEDURES  Procedure(s) performed: No  Procedures   Critical Care performed: No ____________________________________________   INITIAL IMPRESSION / ASSESSMENT AND PLAN / ED COURSE  Pertinent labs & imaging results that were available during my care of the patient were reviewed by me and considered in my medical decision making (see chart for details).  Patient well-appearing and in no acute distress.  Suspect swelling is related to prednisone side effect.  Not consistent with allergic reaction.  Lab work is not consistent with CHF, pulmonary edema.  No abdominal wall edema.  Recommend DC prednisone, would anticipate  improvement over the next several days, outpatient follow-up recommended, return to the ED if any worsening.    ____________________________________________   FINAL CLINICAL IMPRESSION(S) / ED DIAGNOSES  Final diagnoses:  Peripheral edema  Medication side effect, initial encounter        Note:  This document was prepared using Dragon voice recognition software and may include unintentional dictation errors.   Lavonia Drafts, MD 09/13/19 2005

## 2019-09-13 NOTE — ED Triage Notes (Addendum)
Pt here for swelling to legs mostly but generalized body as well.  Reports it started after beginning prednisone for bronchitis 4 days ago. Stopped the prednisone yesterday.  Has also had joint pain.  Noticed increased SHOB. When asked has been eating canned soup and frozen dinners while on the prednisone.  Pt up 15 kg from 2 weeks ago    After finishing triage pt adds that he has had some chest pain. Troponin added to labs.

## 2019-09-13 NOTE — Discharge Instructions (Addendum)
I believe your edema is related to prednisone use, please discontinue this medication as we discussed I would anticipate your edema to gradually improve over the next week.  If any worsening symptoms please return to the emergency department

## 2019-10-18 ENCOUNTER — Emergency Department
Admission: EM | Admit: 2019-10-18 | Discharge: 2019-10-18 | Discharge status: Against Medical Advice | Payer: Medicaid Other

## 2019-10-18 ENCOUNTER — Other Ambulatory Visit: Payer: Self-pay

## 2019-10-18 ENCOUNTER — Encounter: Payer: Self-pay | Admitting: Emergency Medicine

## 2019-10-18 NOTE — ED Notes (Signed)
Patient given water at this time.  

## 2019-10-18 NOTE — ED Triage Notes (Addendum)
Patient states that he is withdrawing from alcohol. Patient states that he is taking Librium to help with the detox. Patient states that he is having nausea and vomiting from the withdrawal. Patient states that reglan has helped in the past with the withdrawal. Patient states that his last drink was 36 hours ago.

## 2019-11-26 ENCOUNTER — Emergency Department
Admission: EM | Admit: 2019-11-26 | Discharge: 2019-11-26 | Disposition: A | Discharge status: Home / Self Care | Payer: Medicaid Other | Attending: Emergency Medicine | Admitting: Emergency Medicine

## 2019-11-26 ENCOUNTER — Emergency Department: Payer: Medicaid Other

## 2019-11-26 ENCOUNTER — Other Ambulatory Visit: Payer: Self-pay

## 2019-11-26 ENCOUNTER — Encounter: Payer: Self-pay | Admitting: Emergency Medicine

## 2019-11-26 DIAGNOSIS — Z79899 Other long term (current) drug therapy: Secondary | ICD-10-CM | POA: Insufficient documentation

## 2019-11-26 DIAGNOSIS — F10230 Alcohol dependence with withdrawal, uncomplicated: Secondary | ICD-10-CM | POA: Insufficient documentation

## 2019-11-26 DIAGNOSIS — R112 Nausea with vomiting, unspecified: Secondary | ICD-10-CM | POA: Insufficient documentation

## 2019-11-26 DIAGNOSIS — F17200 Nicotine dependence, unspecified, uncomplicated: Secondary | ICD-10-CM | POA: Insufficient documentation

## 2019-11-26 DIAGNOSIS — F1210 Cannabis abuse, uncomplicated: Secondary | ICD-10-CM | POA: Diagnosis not present

## 2019-11-26 DIAGNOSIS — R079 Chest pain, unspecified: Secondary | ICD-10-CM | POA: Diagnosis present

## 2019-11-26 DIAGNOSIS — F10930 Alcohol use, unspecified with withdrawal, uncomplicated: Secondary | ICD-10-CM

## 2019-11-26 LAB — TROPONIN I (HIGH SENSITIVITY)
Troponin I (High Sensitivity): 2 ng/L (ref <18)
Troponin I (High Sensitivity): <2 ng/L (ref <18)

## 2019-11-26 LAB — CBC
HCT: 45.4 % (ref 39.0–52.0)
Hemoglobin: 16.4 g/dL (ref 13.0–17.0)
MCH: 34.5 pg — ABNORMAL HIGH (ref 26.0–34.0)
MCHC: 36.1 g/dL — ABNORMAL HIGH (ref 30.0–36.0)
MCV: 95.6 fL (ref 80.0–100.0)
Platelets: 316 10*3/uL (ref 150–400)
RBC: 4.75 MIL/uL (ref 4.22–5.81)
RDW: 12.3 % (ref 11.5–15.5)
WBC: 11.2 10*3/uL — ABNORMAL HIGH (ref 4.0–10.5)
nRBC: 0 % (ref 0.0–0.2)

## 2019-11-26 LAB — BASIC METABOLIC PANEL
Anion gap: 9 (ref 5–15)
BUN: 9 mg/dL (ref 6–20)
CO2: 24 mmol/L (ref 22–32)
Calcium: 10.3 mg/dL (ref 8.9–10.3)
Chloride: 101 mmol/L (ref 98–111)
Creatinine, Ser: 0.88 mg/dL (ref 0.61–1.24)
GFR calc Af Amer: >60 mL/min (ref >60)
GFR calc non Af Amer: >60 mL/min (ref >60)
Glucose, Bld: 143 mg/dL — ABNORMAL HIGH (ref 70–99)
Potassium: 3.8 mmol/L (ref 3.5–5.1)
Sodium: 134 mmol/L — ABNORMAL LOW (ref 135–145)

## 2019-11-26 MED ORDER — CHLORDIAZEPOXIDE HCL 25 MG PO CAPS
25.0000 mg | ORAL_CAPSULE | Freq: Once | ORAL | Status: AC
  Administered 2019-11-26: 25 mg via ORAL
  Filled 2019-11-26: qty 1

## 2019-11-26 MED ORDER — CHLORDIAZEPOXIDE HCL 10 MG PO CAPS: ORAL_CAPSULE | ORAL | 0 refills | Status: DC

## 2019-11-26 MED ORDER — GENERIC EXTERNAL MEDICATION
Status: DC
Start: ? — End: 2019-11-26

## 2019-11-26 MED ORDER — ESCITALOPRAM OXALATE 10 MG PO TABS
5.00 | ORAL_TABLET | ORAL | Status: DC
Start: 2019-11-25 — End: 2019-11-26

## 2019-11-26 MED ORDER — FOLIC ACID 1 MG PO TABS
1.00 | ORAL_TABLET | ORAL | Status: DC
Start: 2019-11-25 — End: 2019-11-26

## 2019-11-26 MED ORDER — DIPHENHYDRAMINE HCL 25 MG PO CAPS
25.00 | ORAL_CAPSULE | ORAL | Status: DC
Start: ? — End: 2019-11-26

## 2019-11-26 MED ORDER — NALTREXONE HCL 50 MG PO TABS
50.00 | ORAL_TABLET | ORAL | Status: DC
Start: 2019-11-25 — End: 2019-11-26

## 2019-11-26 MED ORDER — NICOTINE 21 MG/24HR TD PT24
2.00 | MEDICATED_PATCH | TRANSDERMAL | Status: DC
Start: 2019-11-25 — End: 2019-11-26

## 2019-11-26 MED ORDER — ENOXAPARIN SODIUM 40 MG/0.4ML ~~LOC~~ SOLN
40.00 | SUBCUTANEOUS | Status: DC
Start: 2019-11-25 — End: 2019-11-26

## 2019-11-26 MED ORDER — THERA-M PO TABS
1.00 | ORAL_TABLET | ORAL | Status: DC
Start: 2019-11-25 — End: 2019-11-26

## 2019-11-26 MED ORDER — MELATONIN 3 MG PO TABS
3.00 | ORAL_TABLET | ORAL | Status: DC
Start: 2019-11-24 — End: 2019-11-26

## 2019-11-26 MED ORDER — ALUM & MAG HYDROXIDE-SIMETH 400-400-40 MG/5ML PO SUSP
30.00 | ORAL | Status: DC
Start: ? — End: 2019-11-26

## 2019-11-26 MED ORDER — THIAMINE HCL 100 MG PO TABS
100.00 | ORAL_TABLET | ORAL | Status: DC
Start: 2019-11-25 — End: 2019-11-26

## 2019-11-26 NOTE — ED Notes (Signed)
Pt calling mother for a ride home.

## 2019-11-26 NOTE — ED Provider Notes (Signed)
[facility] Emergency Department Provider Note  Time seen: 11:35 PM  I have reviewed the triage vital signs and the nursing notes.   HISTORY  Chief Complaint Chest Pain   HPI Nicholas Valdez is a 39 y.o. male with a past medical history of hepatitis C, alcohol use, presents to the emergency department for chest pain nausea vomiting.  According to the patient he is currently going through alcohol withdrawal, last drink was 11/20/2019.  Patient was recently discharged from [place] [facility] after an admission for alcohol withdrawal and psychiatric concerns.  Here the patient states he awoke with left-sided chest pain into his left arm, states he has since improved, states nausea vomiting but states he has been nauseated for the last few days which she relates to alcohol withdrawal.  I reviewed the patient's [facility] notes he was admitted for withdrawal and psychiatric evaluation, was on Ativan.  No cardiac history per patient.  No shortness of breath or cough or fever.   Past Medical History:  Diagnosis Date  . Hepatitis C     Patient Active Problem List   Diagnosis Date Noted  . Alcohol withdrawal ([facility]) 12/15/2018    History reviewed. No pertinent surgical history.  Prior to Admission medications   Medication Sig Start Date End Date Taking? Authorizing Provider  acetaminophen (TYLENOL) 325 MG tablet Take 650 mg by mouth every 6 (six) hours as needed.    [provider]  albuterol (PROVENTIL HFA) 108 (90 Base) MCG/ACT inhaler Inhale 2 puffs into the lungs every 4 (four) hours as needed for wheezing or shortness of breath. 09/09/19   Sharman Cheek, MD  Ascorbic Acid (VITAMIN C) 1000 MG tablet Take 1,000 mg by mouth daily.    [provider]  cholecalciferol (VITAMIN D3) 25 MCG (1000 UT) tablet Take 2,000 Units by mouth daily.    [provider]  famotidine (PEPCID) 20 MG tablet Take 1 tablet (20 mg total) by mouth 2 (two) times daily. 09/09/19    Sharman Cheek, MD  ibuprofen (ADVIL) 600 MG tablet Take 1 tablet (600 mg total) by mouth every 6 (six) hours as needed. 04/08/19   Enid Derry, PA-C  metoCLOPramide (REGLAN) 10 MG tablet Take 1 tablet (10 mg total) by mouth every 6 (six) hours as needed. 09/09/19   Sharman Cheek, MD  predniSONE (DELTASONE) 20 MG tablet Take 2 tablets (40 mg total) by mouth daily. 09/09/19   Sharman Cheek, MD    Allergies  Allergen Reactions  . Penicillins     Did it involve swelling of the face/tongue/throat, SOB, or low BP? Yes Did it involve sudden or severe rash/hives, skin peeling, or any reaction on the inside of your mouth or nose? Yes Did you need to seek medical attention at a hospital or doctor's office? Yes When did it last happen?childhood If all above answers are "NO", may proceed with cephalosporin use.  Marland Kitchen Zofran [Ondansetron Hcl]     No family history on file.  Social History Social History   Tobacco Use  . Smoking status: Current Every Day Smoker  . Smokeless tobacco: Never Used  Substance Use Topics  . Alcohol use: Not Currently  . Drug use: Yes    Types: Marijuana    Review of Systems Constitutional: Negative for fever. Cardiovascular: Negative for chest pain. Respiratory: Negative for shortness of breath. Gastrointestinal: Negative for abdominal pain Musculoskeletal: Negative for musculoskeletal complaints Neurological: Negative for headache All other ROS negative  ____________________________________________   PHYSICAL EXAM:  VITAL SIGNS: ED Triage Vitals  Enc Vitals Group     BP 11/26/19 2028 (!) 144/103     Pulse Rate 11/26/19 2028 98     Resp 11/26/19 2028 18     Temp 11/26/19 2028 98.4 F (36.9 C)     Temp Source 11/26/19 2028 Oral     SpO2 11/26/19 2028 96 %     Weight 11/26/19 2029 270 lb (122.5 kg)     Height 11/26/19 2029 5\' 11"  (1.803 m)     Head Circumference --      Peak Flow --      Pain Score 11/26/19 2028 7     Pain Loc --       Pain Edu? --      Excl. in [place]? --    Constitutional: Alert and oriented. Well appearing and in no distress. Eyes: Normal exam ENT      Head: Normocephalic and atraumatic.      Mouth/Throat: Mucous membranes are moist. Cardiovascular: Normal rate, regular rhythm. Respiratory: Normal respiratory effort without tachypnea nor retractions. Breath sounds are clear Gastrointestinal: Soft and nontender. No distention.   Musculoskeletal: Nontender with normal range of motion in all extremities. Neurologic:  Normal speech and language. No gross focal neurologic deficits Skin: Patient has a small amount of erythema to the right antecubital fossa where he had an IV at [facility] with tape/Tegaderm.  Appears most like a mild allergic reaction to the adhesive. Psychiatric: Mood and affect are normal.  ____________________________________________    EKG  EKG viewed and interpreted by myself shows a normal sinus rhythm at 95 bpm with a slightly widened QRS, normal axis, largely normal intervals nonspecific ST changes  ____________________________________________    RADIOLOGY  Chest x-ray negative  ____________________________________________   INITIAL IMPRESSION / ASSESSMENT AND PLAN / ED COURSE  Pertinent labs & imaging results that were available during my care of the patient were reviewed by me and considered in my medical decision making (see chart for details).   Patient presents to the emergency department for left-sided chest discomfort which has since largely resolved.  Also states nausea vomiting but states this has been ongoing over the past several days due to withdrawal symptoms.  Patient states prior to the 17th he was drinking approximately 1/5 of liquor per day.  Possible/questionable history of DTs/seizures in the past.  Do believe patient is still going through a mild to moderate amount of alcohol and possible Ativan withdrawal at this point.  We will place the patient on a Librium  taper.  Patient's emergency department work-up has been reassuring including a negative troponin x2, no significant abnormality on the remainder the patient's lab work and a reassuring chest x-ray.  RAND ETCHISON was evaluated in Emergency Department on 11/26/2019 for the symptoms described in the history of present illness. He was evaluated in the context of the global COVID-19 pandemic, which necessitated consideration that the patient might be at risk for infection with the SARS-CoV-2 virus that causes COVID-19. Institutional protocols and algorithms that pertain to the evaluation of patients at risk for COVID-19 are in a state of rapid change based on information released by regulatory bodies including the CDC and federal and state organizations. These policies and algorithms were followed during the patient's care in the ED.  ____________________________________________   FINAL CLINICAL IMPRESSION(S) / ED DIAGNOSES  Chest pain Alcohol withdrawal   Harvest Dark, MD 11/26/19 2341

## 2019-11-26 NOTE — ED Triage Notes (Signed)
Patient states that he woke up with left side chest pain radiating down his left arm. Patient states that he started having nausea and vomiting about 2 hours ago.

## 2019-11-29 ENCOUNTER — Ambulatory Visit: Payer: Medicaid Other

## 2020-01-10 ENCOUNTER — Encounter: Payer: Self-pay | Admitting: Emergency Medicine

## 2020-01-10 ENCOUNTER — Other Ambulatory Visit: Payer: Self-pay

## 2020-01-10 ENCOUNTER — Emergency Department
Admission: EM | Admit: 2020-01-10 | Discharge: 2020-01-10 | Disposition: A | Discharge status: Home / Self Care | Payer: Medicaid Other | Attending: Student in an Organized Health Care Education/Training Program | Admitting: Student in an Organized Health Care Education/Training Program

## 2020-01-10 DIAGNOSIS — F17200 Nicotine dependence, unspecified, uncomplicated: Secondary | ICD-10-CM | POA: Diagnosis not present

## 2020-01-10 DIAGNOSIS — F419 Anxiety disorder, unspecified: Secondary | ICD-10-CM | POA: Insufficient documentation

## 2020-01-10 DIAGNOSIS — F1020 Alcohol dependence, uncomplicated: Secondary | ICD-10-CM | POA: Insufficient documentation

## 2020-01-10 DIAGNOSIS — R1110 Vomiting, unspecified: Secondary | ICD-10-CM | POA: Diagnosis not present

## 2020-01-10 DIAGNOSIS — E860 Dehydration: Secondary | ICD-10-CM | POA: Diagnosis not present

## 2020-01-10 DIAGNOSIS — J029 Acute pharyngitis, unspecified: Secondary | ICD-10-CM | POA: Insufficient documentation

## 2020-01-10 DIAGNOSIS — R251 Tremor, unspecified: Secondary | ICD-10-CM | POA: Diagnosis not present

## 2020-01-10 DIAGNOSIS — K2920 Alcoholic gastritis without bleeding: Secondary | ICD-10-CM | POA: Diagnosis not present

## 2020-01-10 DIAGNOSIS — R1010 Upper abdominal pain, unspecified: Secondary | ICD-10-CM | POA: Diagnosis present

## 2020-01-10 LAB — BASIC METABOLIC PANEL
Anion gap: 13 (ref 5–15)
BUN: 10 mg/dL (ref 6–20)
CO2: 25 mmol/L (ref 22–32)
Calcium: 9.2 mg/dL (ref 8.9–10.3)
Chloride: 102 mmol/L (ref 98–111)
Creatinine, Ser: 1 mg/dL (ref 0.61–1.24)
GFR calc Af Amer: >60 mL/min (ref >60)
GFR calc non Af Amer: >60 mL/min (ref >60)
Glucose, Bld: 115 mg/dL — ABNORMAL HIGH (ref 70–99)
Potassium: 3.5 mmol/L (ref 3.5–5.1)
Sodium: 140 mmol/L (ref 135–145)

## 2020-01-10 LAB — CBC WITH DIFFERENTIAL/PLATELET
Abs Immature Granulocytes: 0.01 10*3/uL (ref 0.00–0.07)
Basophils Absolute: 0.1 10*3/uL (ref 0.0–0.1)
Basophils Relative: 1 %
Eosinophils Absolute: 0.1 10*3/uL (ref 0.0–0.5)
Eosinophils Relative: 2 %
HCT: 45.7 % (ref 39.0–52.0)
Hemoglobin: 16.7 g/dL (ref 13.0–17.0)
Immature Granulocytes: 0 %
Lymphocytes Relative: 45 %
Lymphs Abs: 2.8 10*3/uL (ref 0.7–4.0)
MCH: 33.9 pg (ref 26.0–34.0)
MCHC: 36.5 g/dL — ABNORMAL HIGH (ref 30.0–36.0)
MCV: 92.7 fL (ref 80.0–100.0)
Monocytes Absolute: 0.9 10*3/uL (ref 0.1–1.0)
Monocytes Relative: 14 %
Neutro Abs: 2.4 10*3/uL (ref 1.7–7.7)
Neutrophils Relative %: 38 %
Platelets: 276 10*3/uL (ref 150–400)
RBC: 4.93 MIL/uL (ref 4.22–5.81)
RDW: 13.1 % (ref 11.5–15.5)
WBC: 6.2 10*3/uL (ref 4.0–10.5)
nRBC: 0 % (ref 0.0–0.2)

## 2020-01-10 LAB — HEPATIC FUNCTION PANEL
ALT: 85 U/L — ABNORMAL HIGH (ref 0–44)
AST: 110 U/L — ABNORMAL HIGH (ref 15–41)
Albumin: 4.2 g/dL (ref 3.5–5.0)
Alkaline Phosphatase: 61 U/L (ref 38–126)
Bilirubin, Direct: <0.1 mg/dL (ref 0.0–0.2)
Total Bilirubin: 0.6 mg/dL (ref 0.3–1.2)
Total Protein: 7.8 g/dL (ref 6.5–8.1)

## 2020-01-10 LAB — URINALYSIS, COMPLETE (UACMP) WITH MICROSCOPIC
Bacteria, UA: NONE SEEN
Bilirubin Urine: NEGATIVE
Glucose, UA: NEGATIVE mg/dL
Hgb urine dipstick: NEGATIVE
Ketones, ur: NEGATIVE mg/dL
Leukocytes,Ua: NEGATIVE
Nitrite: NEGATIVE
Protein, ur: 100 mg/dL — AB
Specific Gravity, Urine: 1.03 (ref 1.005–1.030)
Squamous Epithelial / HPF: NONE SEEN (ref 0–5)
pH: 6 (ref 5.0–8.0)

## 2020-01-10 LAB — GROUP A STREP BY PCR: Group A Strep by PCR: NOT DETECTED

## 2020-01-10 LAB — LIPASE, BLOOD: Lipase: 47 U/L (ref 11–51)

## 2020-01-10 MED ORDER — LORAZEPAM 2 MG PO TABS
2.0000 mg | ORAL_TABLET | Freq: Once | ORAL | Status: AC
  Administered 2020-01-10: 2 mg via ORAL
  Filled 2020-01-10: qty 1

## 2020-01-10 MED ORDER — CHLORDIAZEPOXIDE HCL 10 MG PO CAPS
10.0000 mg | ORAL_CAPSULE | Freq: Four times a day (QID) | ORAL | 0 refills | Status: DC | PRN
Start: 2020-01-10 — End: 2020-01-19

## 2020-01-10 MED ORDER — FAMOTIDINE IN NACL 20-0.9 MG/50ML-% IV SOLN
20.0000 mg | Freq: Once | INTRAVENOUS | Status: AC
  Administered 2020-01-10: 20 mg via INTRAVENOUS
  Filled 2020-01-10: qty 50

## 2020-01-10 MED ORDER — SODIUM CHLORIDE 0.9 % IV BOLUS
1000.0000 mL | Freq: Once | INTRAVENOUS | Status: AC
  Administered 2020-01-10: 1000 mL via INTRAVENOUS

## 2020-01-10 MED ORDER — LORAZEPAM 2 MG/ML IJ SOLN
2.0000 mg | Freq: Once | INTRAMUSCULAR | Status: AC
  Administered 2020-01-10: 2 mg via INTRAVENOUS
  Filled 2020-01-10: qty 1

## 2020-01-10 MED ORDER — METOCLOPRAMIDE HCL 5 MG/ML IJ SOLN
10.0000 mg | Freq: Once | INTRAMUSCULAR | Status: AC
  Administered 2020-01-10: 10 mg via INTRAVENOUS
  Filled 2020-01-10: qty 2

## 2020-01-10 NOTE — ED Triage Notes (Signed)
Pt arrives POV to triage with c/o sore throat and emesis since Saturday. Pt states that nothing will stay down at this time. Pt is in NAD.

## 2020-01-10 NOTE — ED Notes (Signed)
This RN to bedside, pt awakens easily, instructed patient to call for his ride. Pt states understanding at this time, pt sitting up drinking giner ale at this time.

## 2020-01-10 NOTE — ED Notes (Signed)
This RN to bedside, pt awakens easily to mild stimuli, this RN introduced self to patient, pt then immediately back to sleep. Call bell remains within reach. VSS and improved at this time.

## 2020-01-10 NOTE — ED Notes (Signed)
Pt awoken by this RN, this RN asked about ride, pt states "let me call them and find out what's going on".

## 2020-01-10 NOTE — ED Notes (Signed)
Pt alert and oriented X 4, stable for discharge. RR even and unlabored, color WNL. Discussed discharge instructions and follow up when appropriate. Instructed to follow up with ER for any life threatening symptoms or concerns that patient or family of patient may have Ride will be here at 1115

## 2020-01-10 NOTE — ED Notes (Signed)
EDP at bedside, pt continues to rest in bed with NAD noted. Per EDP hold until patient awake and able to tolerate PO.

## 2020-01-10 NOTE — ED Provider Notes (Signed)
Patient received in signout from Dr. Scotty Court pending reassessment after fluids and antibiotics.  Patient tolerating p.o.  Nontoxic-appearing.  Repeat exam is reassuring.  He is appropriate for outpatient follow-up.   Willy Eddy, MD 01/10/20 [phone]

## 2020-01-10 NOTE — ED Notes (Signed)
Report received from Noel, RN. 

## 2020-01-10 NOTE — ED Provider Notes (Signed)
[facility] Emergency Department Provider Note  ____________________________________________  Time seen: Approximately 6:41 AM  I have reviewed the triage vital signs and the nursing notes.   HISTORY  Chief Complaint Sore Throat    HPI Nicholas Valdez is a 39 y.o. male with a history of alcohol dependence and hepatitis C who comes the ED complaining of  upper abdominal pain, nausea and vomiting for the past 2 days.  Normally drinks heavily every single day, has a history of hallucinosis with alcohol withdrawal.  Arrived feeling shaky and anxious and sweaty.  He was given Ativan in triage.  Symptoms are constant, nonradiating.  Denies black or bloody stool, no diarrhea.  No chest pain shortness of breath fevers or chills.  Feels like GERD that he has had in the past.  Last drink was Saturday night, approximately 36 hours ago.   Past Medical History:  Diagnosis Date  . Hepatitis C      Patient Active Problem List   Diagnosis Date Noted  . Alcohol withdrawal ([place]) 12/15/2018     History reviewed. No pertinent surgical history.   Prior to Admission medications   Medication Sig Start Date End Date Taking? Authorizing Provider  acetaminophen (TYLENOL) 325 MG tablet Take 650 mg by mouth every 6 (six) hours as needed.    [provider]  albuterol (PROVENTIL HFA) 108 (90 Base) MCG/ACT inhaler Inhale 2 puffs into the lungs every 4 (four) hours as needed for wheezing or shortness of breath. 09/09/19   Carrie Mew, MD  Ascorbic Acid (VITAMIN C) 1000 MG tablet Take 1,000 mg by mouth daily.    [provider]  chlordiazePOXIDE (LIBRIUM) 10 MG capsule Take 1 capsule (10 mg total) by mouth 4 (four) times daily as needed for anxiety or withdrawal. 01/10/20   Carrie Mew, MD  cholecalciferol (VITAMIN D3) 25 MCG (1000 UT) tablet Take 2,000 Units by mouth daily.    [provider]  famotidine (PEPCID) 20 MG tablet Take 1 tablet (20  mg total) by mouth 2 (two) times daily. 09/09/19   Carrie Mew, MD  ibuprofen (ADVIL) 600 MG tablet Take 1 tablet (600 mg total) by mouth every 6 (six) hours as needed. 04/08/19   Laban Emperor, PA-C  metoCLOPramide (REGLAN) 10 MG tablet Take 1 tablet (10 mg total) by mouth every 6 (six) hours as needed. 09/09/19   Carrie Mew, MD  predniSONE (DELTASONE) 20 MG tablet Take 2 tablets (40 mg total) by mouth daily. 09/09/19   Carrie Mew, MD     Allergies Penicillins and Zofran Alvis Lemmings hcl]   No family history on file.  Social History Social History   Tobacco Use  . Smoking status: Current Every Day Smoker  . Smokeless tobacco: Never Used  Substance Use Topics  . Alcohol use: Yes    Comment: Pt states that he usually drinks a pint of whiskey per day but has had none today.   . Drug use: Yes    Types: Marijuana    Review of Systems  Constitutional:   No fever or chills.  ENT:   No sore throat. No rhinorrhea. Cardiovascular:   No chest pain or syncope. Respiratory:   No dyspnea or cough. Gastrointestinal: Positive for upper abdominal pain and vomiting.  Musculoskeletal:   Negative for focal pain or swelling All other systems reviewed and are negative except as documented above in ROS and HPI.  ____________________________________________   PHYSICAL EXAM:  VITAL SIGNS: ED Triage Vitals  Enc Vitals  Group     BP 01/10/20 0032 (!) 143/99     Pulse Rate 01/10/20 0032 (!) 108     Resp 01/10/20 0032 20     Temp 01/10/20 0032 98 F (36.7 C)     Temp Source 01/10/20 0032 Oral     SpO2 01/10/20 0032 97 %     Weight 01/10/20 0048 290 lb (131.5 kg)     Height 01/10/20 0048 5\' 11"  (1.803 m)     Head Circumference --      Peak Flow --      Pain Score 01/10/20 0048 8     Pain Loc --      Pain Edu? --      Excl. in [place]? --     Vital signs reviewed, nursing assessments reviewed.   Constitutional:   Alert and oriented. Non-toxic appearance. Eyes:   Conjunctivae  are normal. EOMI. PERRL. ENT      Head:   Normocephalic and atraumatic.      Nose:   Normal.      Mouth/Throat:   Dry mucous membranes.  Oropharyngeal erythema      Neck:   No meningismus. Full ROM. Hematological/Lymphatic/Immunilogical:   No cervical lymphadenopathy. Cardiovascular:   RRR. Symmetric bilateral radial and DP pulses.  No murmurs. Cap refill less than 2 seconds. Respiratory:   Normal respiratory effort without tachypnea/retractions. Breath sounds are clear and equal bilaterally. No wheezes/rales/rhonchi. Gastrointestinal:   Soft with left upper quadrant tenderness. Non distended. There is no CVA tenderness.  No rebound, rigidity, or guarding. Musculoskeletal:   Normal range of motion in all extremities. No joint effusions.  No lower extremity tenderness.  No edema. Neurologic:   Normal speech and language.  Motor grossly intact. No acute focal neurologic deficits are appreciated.  Skin:    Skin is warm, dry and intact. No rash noted.  No petechiae, purpura, or bullae.  ____________________________________________    LABS (pertinent positives/negatives) (all labs ordered are listed, but only abnormal results are displayed) Labs Reviewed  CBC WITH DIFFERENTIAL/PLATELET - Abnormal; Notable for the following components:      Result Value   MCHC 36.5 (*)    All other components within normal limits  BASIC METABOLIC PANEL - Abnormal; Notable for the following components:   Glucose, Bld 115 (*)    All other components within normal limits  URINALYSIS, COMPLETE (UACMP) WITH MICROSCOPIC - Abnormal; Notable for the following components:   Color, Urine AMBER (*)    APPearance HAZY (*)    Protein, ur 100 (*)    All other components within normal limits  HEPATIC FUNCTION PANEL - Abnormal; Notable for the following components:   AST 110 (*)    ALT 85 (*)    All other components within normal limits  GROUP A STREP BY PCR  LIPASE, BLOOD    ____________________________________________   EKG    ____________________________________________    RADIOLOGY  No results found.  ____________________________________________   PROCEDURES Procedures  ____________________________________________    CLINICAL IMPRESSION / ASSESSMENT AND PLAN / ED COURSE  Medications ordered in the ED: Medications  sodium chloride 0.9 % bolus 1,000 mL (has no administration in time range)  metoCLOPramide (REGLAN) injection 10 mg (has no administration in time range)  famotidine (PEPCID) IVPB 20 mg premix (has no administration in time range)  LORazepam (ATIVAN) injection 2 mg (has no administration in time range)  LORazepam (ATIVAN) tablet 2 mg (2 mg Oral Given 01/10/20 0248)    Pertinent  labs & imaging results that were available during my care of the patient were reviewed by me and considered in my medical decision making (see chart for details).  Nicholas Valdez was evaluated in Emergency Department on 01/10/2020 for the symptoms described in the history of present illness. He was evaluated in the context of the global COVID-19 pandemic, which necessitated consideration that the patient might be at risk for infection with the SARS-CoV-2 virus that causes COVID-19. Institutional protocols and algorithms that pertain to the evaluation of patients at risk for COVID-19 are in a state of rapid change based on information released by regulatory bodies including the CDC and federal and state organizations. These policies and algorithms were followed during the patient's care in the ED.   Patient presents with upper abdominal pain and vomiting in the setting of heavy alcohol use.  Exam consistent with gastritis.  Vital signs show mild tachycardia consistent with rest of physical exam that suggest dehydration.  I think this is alcoholic gastritis.  Labs are reassuring, normal, doubt pancreatitis, hepatitis, biliary disease, bowel obstruction or  perforation.  We will give IV fluids, Pepcid, Reglan for symptomatic relief.  If symptoms are improved will be stable for discharge home.  Patient expresses interest in Librium detox at home which she says he has completed in the past and has good support from his parents who are both recovering alcoholics.  I will prescribe Librium course, follow-up information for [facility].      ____________________________________________   FINAL CLINICAL IMPRESSION(S) / ED DIAGNOSES    Final diagnoses:  Acute alcoholic gastritis without hemorrhage  Dehydration  Uncomplicated alcohol dependence [facility])     ED Discharge Orders         Ordered    chlordiazePOXIDE (LIBRIUM) 10 MG capsule  4 times daily PRN     01/10/20 0641          Portions of this note were generated with dragon dictation software. Dictation errors may occur despite best attempts at proofreading.   Sharman Cheek, MD 01/10/20 [phone]

## 2020-01-10 NOTE — ED Notes (Signed)
Pt c/o gastric pain reports hx of dailey pint ETOH drinker, last drink Saturday evening, pt reports withdrawal s/sx but denies seizures, pt denies desire for detox  Reports epigastric pain worse with palpation

## 2020-01-10 NOTE — ED Notes (Signed)
Pt given meds per EDP order. Pt states feeling shaky and anxious. Pt given a glass of water and appears to have calmed down.

## 2020-01-10 NOTE — ED Notes (Signed)
This RN to bedside, pt resting in bed comfortably with eyes closed, respirations even and unlabored, lights remain dimmed for patient comfort, pt with noted approx 500cc's of fluid left in fluid bolus.

## 2020-01-19 ENCOUNTER — Emergency Department
Admission: EM | Admit: 2020-01-19 | Discharge: 2020-01-19 | Disposition: A | Discharge status: Home / Self Care | Payer: Medicaid Other | Attending: Student | Admitting: Student

## 2020-01-19 ENCOUNTER — Other Ambulatory Visit: Payer: Self-pay

## 2020-01-19 DIAGNOSIS — F17210 Nicotine dependence, cigarettes, uncomplicated: Secondary | ICD-10-CM | POA: Diagnosis not present

## 2020-01-19 DIAGNOSIS — Z79899 Other long term (current) drug therapy: Secondary | ICD-10-CM | POA: Insufficient documentation

## 2020-01-19 DIAGNOSIS — F1090 Alcohol use, unspecified, uncomplicated: Secondary | ICD-10-CM

## 2020-01-19 DIAGNOSIS — Y907 Blood alcohol level of 200-239 mg/100 ml: Secondary | ICD-10-CM | POA: Diagnosis not present

## 2020-01-19 DIAGNOSIS — F10929 Alcohol use, unspecified with intoxication, unspecified: Secondary | ICD-10-CM | POA: Insufficient documentation

## 2020-01-19 DIAGNOSIS — Z789 Other specified health status: Secondary | ICD-10-CM

## 2020-01-19 LAB — COMPREHENSIVE METABOLIC PANEL
ALT: 101 U/L — ABNORMAL HIGH (ref 0–44)
AST: 199 U/L — ABNORMAL HIGH (ref 15–41)
Albumin: 4.2 g/dL (ref 3.5–5.0)
Alkaline Phosphatase: 62 U/L (ref 38–126)
Anion gap: 11 (ref 5–15)
BUN: 11 mg/dL (ref 6–20)
CO2: 21 mmol/L — ABNORMAL LOW (ref 22–32)
Calcium: 8.3 mg/dL — ABNORMAL LOW (ref 8.9–10.3)
Chloride: 104 mmol/L (ref 98–111)
Creatinine, Ser: 1.01 mg/dL (ref 0.61–1.24)
GFR calc Af Amer: >60 mL/min (ref >60)
GFR calc non Af Amer: >60 mL/min (ref >60)
Glucose, Bld: 194 mg/dL — ABNORMAL HIGH (ref 70–99)
Potassium: 3.5 mmol/L (ref 3.5–5.1)
Sodium: 136 mmol/L (ref 135–145)
Total Bilirubin: 1 mg/dL (ref 0.3–1.2)
Total Protein: 7.9 g/dL (ref 6.5–8.1)

## 2020-01-19 LAB — URINE DRUG SCREEN, QUALITATIVE (ARMC ONLY)
Amphetamines, Ur Screen: NOT DETECTED
Barbiturates, Ur Screen: NOT DETECTED
Benzodiazepine, Ur Scrn: POSITIVE — AB
Cannabinoid 50 Ng, Ur ~~LOC~~: POSITIVE — AB
Cocaine Metabolite,Ur ~~LOC~~: NOT DETECTED
MDMA (Ecstasy)Ur Screen: NOT DETECTED
Methadone Scn, Ur: NOT DETECTED
Opiate, Ur Screen: NOT DETECTED
Phencyclidine (PCP) Ur S: NOT DETECTED
Tricyclic, Ur Screen: NOT DETECTED

## 2020-01-19 LAB — CBC
HCT: 47 % (ref 39.0–52.0)
Hemoglobin: 17 g/dL (ref 13.0–17.0)
MCH: 33.9 pg (ref 26.0–34.0)
MCHC: 36.2 g/dL — ABNORMAL HIGH (ref 30.0–36.0)
MCV: 93.8 fL (ref 80.0–100.0)
Platelets: 208 10*3/uL (ref 150–400)
RBC: 5.01 MIL/uL (ref 4.22–5.81)
RDW: 13.5 % (ref 11.5–15.5)
WBC: 6 10*3/uL (ref 4.0–10.5)
nRBC: 0 % (ref 0.0–0.2)

## 2020-01-19 LAB — ETHANOL: Alcohol, Ethyl (B): 228 mg/dL — ABNORMAL HIGH (ref <10)

## 2020-01-19 MED ORDER — LORAZEPAM 2 MG PO TABS
0.0000 mg | ORAL_TABLET | Freq: Four times a day (QID) | ORAL | Status: DC
  Administered 2020-01-19: 1 mg via ORAL
  Filled 2020-01-19 (×2): qty 1

## 2020-01-19 MED ORDER — LACTATED RINGERS IV BOLUS
1000.0000 mL | Freq: Once | INTRAVENOUS | Status: AC
  Administered 2020-01-19: 1000 mL via INTRAVENOUS

## 2020-01-19 MED ORDER — THIAMINE HCL 100 MG PO TABS
100.0000 mg | ORAL_TABLET | Freq: Every day | ORAL | Status: DC
  Administered 2020-01-19: 100 mg via ORAL
  Filled 2020-01-19: qty 1

## 2020-01-19 MED ORDER — NICOTINE 21 MG/24HR TD PT24
21.0000 mg | MEDICATED_PATCH | Freq: Every day | TRANSDERMAL | Status: DC
  Administered 2020-01-19: 21 mg via TRANSDERMAL
  Filled 2020-01-19: qty 1

## 2020-01-19 MED ORDER — METOCLOPRAMIDE HCL 5 MG/ML IJ SOLN
10.0000 mg | Freq: Once | INTRAMUSCULAR | Status: AC
  Administered 2020-01-19: 10 mg via INTRAVENOUS
  Filled 2020-01-19: qty 2

## 2020-01-19 MED ORDER — FOLIC ACID 5 MG/ML IJ SOLN
1.0000 mg | Freq: Once | INTRAMUSCULAR | Status: AC
  Administered 2020-01-19: 1 mg via INTRAVENOUS
  Filled 2020-01-19: qty 0.2

## 2020-01-19 MED ORDER — METOCLOPRAMIDE HCL 10 MG PO TABS
10.0000 mg | ORAL_TABLET | Freq: Once | ORAL | Status: DC
  Filled 2020-01-19: qty 1

## 2020-01-19 MED ORDER — SODIUM CHLORIDE 0.9 % IV SOLN: 1.0000 mg | Freq: Once | INTRAVENOUS | Status: DC

## 2020-01-19 MED ORDER — CHLORDIAZEPOXIDE HCL 25 MG PO CAPS
ORAL_CAPSULE | ORAL | 0 refills | Status: DC
Start: 2020-01-19 — End: 2020-05-28

## 2020-01-19 MED ORDER — LORAZEPAM 2 MG PO TABS: 0.0000 mg | ORAL_TABLET | Freq: Two times a day (BID) | ORAL | Status: DC

## 2020-01-19 MED ORDER — THIAMINE HCL 100 MG/ML IJ SOLN: 100.0000 mg | Freq: Every day | INTRAMUSCULAR | Status: DC

## 2020-01-19 MED ORDER — LORAZEPAM 2 MG/ML IJ SOLN
0.0000 mg | Freq: Four times a day (QID) | INTRAMUSCULAR | Status: DC
  Administered 2020-01-19: 2 mg via INTRAVENOUS
  Filled 2020-01-19: qty 1

## 2020-01-19 MED ORDER — LORAZEPAM 2 MG/ML IJ SOLN: 0.0000 mg | Freq: Two times a day (BID) | INTRAMUSCULAR | Status: DC

## 2020-01-19 NOTE — ED Notes (Signed)
VOL, pend TTS consult °

## 2020-01-19 NOTE — ED Provider Notes (Signed)
Patient stated he would like to go home at this time. Did offer to have patient stay in the emergency department to see if we could get him a bed at an inpatient facility. He however stated he would like to leave and try a librium taper. Will give patient prescription for librium taper. Will give patient rha and rts information.   Phineas Semen, MD 01/19/20 2019

## 2020-01-19 NOTE — ED Notes (Signed)
Pt in restroom when this nurse was receiving report. No urine collected at this time. Pt asked to provided sample with next trip to toilet

## 2020-01-19 NOTE — ED Notes (Signed)
TTS at bedside. 

## 2020-01-19 NOTE — ED Provider Notes (Signed)
[facility] Emergency Department Provider Note  ____________________________________________   First MD Initiated Contact with Patient 01/19/20 1252     (approximate)  I have reviewed the triage vital signs and the nursing notes.  History  Chief Complaint Alcohol Problem    HPI Nicholas Valdez is a 39 y.o. 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  States he drinks about 1/5 of liquor every day, increased recently from ~ pint/day due to life stressors (daugther struggling w/ mental health).  Last drink was about 4 hours prior to arrival.  He reports history of withdrawals w/ hallucinations (but not seizures) and has been admitted for alcohol detox previously.  On chart review, appears most recently was in April at [facility].  He was seen here on 6/7 for alcoholic gastritis and discharged with a Librium taper.  States he took the Librium taper as prescribed with mild improvement in his symptoms, but is still reporting shakiness, nausea, vomiting, inability to tolerate by mouth.  He is requesting inpatient detox.  He denies any SI.   Past Medical Hx Past Medical History:  Diagnosis Date  . Hepatitis C     Problem List Patient Active Problem List   Diagnosis Date Noted  . Alcohol withdrawal ([facility]) 12/15/2018    Past Surgical Hx History reviewed. No pertinent surgical history.  Medications Prior to Admission medications   Medication Sig Start Date End Date Taking? Authorizing Provider  acetaminophen (TYLENOL) 325 MG tablet Take 650 mg by mouth every 6 (six) hours as needed.    [provider]  albuterol (PROVENTIL HFA) 108 (90 Base) MCG/ACT inhaler Inhale 2 puffs into the lungs every 4 (four) hours as needed for wheezing or shortness of breath. 09/09/19   Sharman Cheek, MD  Ascorbic Acid (VITAMIN C) 1000 MG tablet Take  1,000 mg by mouth daily.    [provider]  chlordiazePOXIDE (LIBRIUM) 10 MG capsule Take 1 capsule (10 mg total) by mouth 4 (four) times daily as needed for anxiety or withdrawal. 01/10/20   Sharman Cheek, MD  cholecalciferol (VITAMIN D3) 25 MCG (1000 UT) tablet Take 2,000 Units by mouth daily.    [provider]  famotidine (PEPCID) 20 MG tablet Take 1 tablet (20 mg total) by mouth 2 (two) times daily. 09/09/19   Sharman Cheek, MD  ibuprofen (ADVIL) 600 MG tablet Take 1 tablet (600 mg total) by mouth every 6 (six) hours as needed. 04/08/19   Enid Derry, PA-C  metoCLOPramide (REGLAN) 10 MG tablet Take 1 tablet (10 mg total) by mouth every 6 (six) hours as needed. 09/09/19   Sharman Cheek, MD  predniSONE (DELTASONE) 20 MG tablet Take 2 tablets (40 mg total) by mouth daily. 09/09/19   Sharman Cheek, MD    Allergies Penicillins and Zofran Frazier Richards hcl]  Family Hx History reviewed. No pertinent family history.  Social Hx Social History   Tobacco Use  . Smoking status: Current Every Day Smoker  . Smokeless tobacco: Never Used  Vaping Use  . Vaping Use: Former  Substance Use Topics  . Alcohol use: Yes    Comment: Pt states that he usually drinks a pint of whiskey per day but has had none today.   . Drug use: Yes    Types: Marijuana     Review of Systems  Constitutional: Negative for fever. Negative for chills. Eyes: Negative for visual  changes. ENT: Negative for sore throat. Cardiovascular: Negative for chest pain. Respiratory: Negative for shortness of breath. Gastrointestinal: + nausea, vomiting Genitourinary: Negative for dysuria. Musculoskeletal: Negative for leg swelling. Skin: Negative for rash. Neurological: Negative for headaches. + alcohol withdrawal   Physical Exam  Vital Signs: ED Triage Vitals [01/19/20 1226]  Enc Vitals Group     BP (!) 146/106     Pulse Rate (!) 120     Resp 20     Temp 98 F (36.7 C)     Temp Source  Oral     SpO2 98 %     Weight 280 lb (127 kg)     Height 6' (1.829 m)     Head Circumference      Peak Flow      Pain Score 0     Pain Loc      Pain Edu?      Excl. in [place]?     Constitutional: Alert and oriented.  NAD.  Head: Normocephalic. Atraumatic. Eyes: Conjunctivae clear. Sclera anicteric. Pupils equal and symmetric. Nose: No masses or lesions. No congestion or rhinorrhea. Mouth/Throat: Wearing mask.  Neck: No stridor. Trachea midline.  Cardiovascular: Tachycardic, regular rhythm. Extremities well perfused. Hypertensive.  Respiratory: Normal respiratory effort.  Lungs CTAB. Gastrointestinal: Soft. Non-distended. Non-tender.  Genitourinary: Deferred. Musculoskeletal: No lower extremity edema. No deformities. Neurologic:  Normal speech and language. No gross focal or lateralizing neurologic deficits are appreciated.  No tremors. Skin: Skin is warm, dry and intact. No rash noted. Psychiatric: Calm and cooperative.  Denies SI.    Procedures  Procedure(s) performed (including critical care):  Procedures   Initial Impression / Assessment and Plan / MDM / ED Course  39 y.o. male who presents to the ED for assistance with alcohol detox.  On arrival, he is tachycardic and mildly hypertensive, but no objective tremors.    Ddx: alcohol abuse, alcohol withdrawal, complicated withdrawal, dehydration  Will plan for labs, CIWA, fluids, thiamine, folate.  Will consult TTS for assistance with detox resources.  Labs reveal moderately elevated AST and ALT, consistent with his alcohol use and similar to prior. Alcohol level 228. Initial CIWA 7.   Awaiting TTS evaluation, reassessment of symptoms/VS for final disposition re: inpatient vs outpatient detox.    _______________________________   As part of my medical decision making I have reviewed available labs, radiology tests, reviewed old records/performed chart review, and discussed with consultants (TTS).     Final Clinical  Impression(s) / ED Diagnosis  Final diagnoses:  Alcohol use       Note:  This document was prepared using Dragon voice recognition software and may include unintentional dictation errors.   Lilia Pro., MD 01/19/20 [phone]

## 2020-01-19 NOTE — ED Notes (Signed)
Pt requesting more ativan- Dr Derrill Kay aware and states pt cannot have any at this time- pt made aware and verbalized understand

## 2020-01-19 NOTE — ED Notes (Signed)
Pt given saltine crackers. 

## 2020-01-19 NOTE — BH Assessment (Signed)
Assessment Note  Nicholas Valdez is an 39 y.o. male presents to [facility] ED voluntarily for treatment. Per triage note, Pt arrives via POV for reports of needing "inpatient detox". PT states he was going to try and detox at home but now thinks he needs to detox in a facility due to increased alcohol intake and family stressors. PT states last drink was 4 hours ago and he drinks a 5th of liquor every day. Pt cooperative and in NAD.  During TTS assessment pt presents alert and oriented x 3, anxious but cooperative, and mood-congruent with affect. The pt does not appear to be responding to internal or external stimuli. Neither is the pt presenting with any delusional thinking. Pt confirmed the information provided to triage RN. Pt reports a hx of SA and identified alcohol as his primary substance choice. Pt reports seeking INPT for detox last week at [facility] (01/10/20) and being provide a Librium taper that he completed. Pt reports shortly after being triggered by his daughter's current MH, sadness, depression and PTSD symptoms resulting to him drinking again. Pt reports drinking two bottles of wine before his admission and stated "just to help hold me over while I wait". Pt states "I also smoke a little weed but nothing major". Pt reports a previous hx of opiates addiction and his last use to over 10 years ago. Pt reports trouble sleeping (2-3hrs) and eating (last time 2 days ago). Pt reports an INPT hx for detox with [facility] and another facility he was unable to identify. Pt reports to be receiving OPT services through [facility]. Pt reports a family hx of MH/SA for both parents. Pt denies any current SI/HI/AH/VH and contracted for safety. Pt stated "all I am looking for is a place to detox, help with my depression and PTSD" and provide his friend Natalia Leatherwood [phone]) as a collateral contact.   Disposition is pending psych consult   Diagnosis: Alcohol use disorder     Past Medical History:  Past  Medical History:  Diagnosis Date  . Hepatitis C     History reviewed. No pertinent surgical history.  Family History: History reviewed. No pertinent family history.  Social History:  reports that he has been smoking. He has never used smokeless tobacco. He reports current alcohol use. He reports current drug use. Drug: Marijuana.  Additional Social History:  Alcohol / Drug Use Pain Medications: see mar Prescriptions: see mar Over the Counter: see mar History of alcohol / drug use?: Yes Substance #1 Name of Substance 1: Alcohol Substance #2 Name of Substance 2: Marijauna  CIWA: CIWA-Ar BP: (!) 146/106 Pulse Rate: (!) 120 Nausea and Vomiting: no nausea and no vomiting Tactile Disturbances: none Tremor: moderate, with patient's arms extended Auditory Disturbances: not present Paroxysmal Sweats: two Visual Disturbances: not present Anxiety: mildly anxious Headache, Fullness in Head: none present Agitation: normal activity Orientation and Clouding of Sensorium: oriented and can do serial additions CIWA-Ar Total: 7 COWS:    Allergies:  Allergies  Allergen Reactions  . Penicillins     Did it involve swelling of the face/tongue/throat, SOB, or low BP? Yes Did it involve sudden or severe rash/hives, skin peeling, or any reaction on the inside of your mouth or nose? Yes Did you need to seek medical attention at a hospital or doctor's office? Yes When did it last happen?childhood If all above answers are "NO", may proceed with cephalosporin use.  Marland Kitchen Zofran [Ondansetron Hcl]     Home Medications: (Not in a  hospital admission)   OB/GYN Status:  No LMP for male patient.  General Assessment Data Location of Assessment: [facility] ED TTS Assessment: In system Is this a Tele or Face-to-Face Assessment?: Face-to-Face Is this an Initial Assessment or a Re-assessment for this encounter?: Initial Assessment Patient Accompanied by:: N/A Language Other than English: No Living  Arrangements: Other (Comment) (Private home ) What gender do you identify as?: Male Marital status: Single Maiden name: n/a Pregnancy Status: No Living Arrangements: Parent, Children Can pt return to current living arrangement?: Yes Admission Status: Voluntary Is patient capable of signing voluntary admission?: Yes Referral Source: Self/Family/Friend Insurance type: Medicaid   Medical Screening Exam [facility] Walk-in ONLY) Medical Exam completed: Yes  Crisis Care Plan Living Arrangements: Parent, Children Legal Guardian:  (self) Name of Psychiatrist: [facility] Name of Therapist: [facility]  Education Status Is patient currently in school?: No Is the patient employed, unemployed or receiving disability?: Unemployed  Risk to self with the past 6 months Suicidal Ideation: No Has patient been a risk to self within the past 6 months prior to admission? : No Suicidal Intent: No Has patient had any suicidal intent within the past 6 months prior to admission? : No Is patient at risk for suicide?: No Suicidal Plan?: No Has patient had any suicidal plan within the past 6 months prior to admission? : No Access to Means: No What has been your use of drugs/alcohol within the last 12 months?: alcohol & marijuana  Previous Attempts/Gestures: No How many times?: 0 Other Self Harm Risks: None reported  Triggers for Past Attempts: None known Intentional Self Injurious Behavior: None Family Suicide History: Unknown Recent stressful life event(s): Other (Comment) (Daughter's MH) Persecutory voices/beliefs?: No Depression: Yes Depression Symptoms: Despondent Substance abuse history and/or treatment for substance abuse?: Yes Suicide prevention information given to non-admitted patients: Not applicable  Risk to Others within the past 6 months Homicidal Ideation: No Does patient have any lifetime risk of violence toward others beyond the six months prior to admission? :  No Thoughts of Harm to Others: No Current Homicidal Intent: No Current Homicidal Plan: No Access to Homicidal Means: No Identified Victim: N/A History of harm to others?: No Assessment of Violence: None Noted Violent Behavior Description: N/A Does patient have access to weapons?: No Criminal Charges Pending?: No Does patient have a court date: No Is patient on probation?: No  Psychosis Hallucinations: None noted Delusions: None noted  Mental Status Report Appearance/Hygiene: In scrubs Eye Contact: Good Motor Activity: Freedom of movement Speech: Logical/coherent Level of Consciousness: Alert Mood: Depressed, Anxious, Pleasant Affect: Anxious, Depressed Anxiety Level: Minimal Thought Processes: Coherent, Relevant Judgement: Partial Orientation: Appropriate for developmental age Obsessive Compulsive Thoughts/Behaviors: None  Cognitive Functioning Concentration: Good Memory: Recent Intact, Remote Intact Is patient IDD: No Insight: Fair Impulse Control: Good Appetite: Poor Have you had any weight changes? : No Change Sleep: Decreased Total Hours of Sleep:  (PT reports 2-3 hrs) Vegetative Symptoms: None     Prior Inpatient Therapy Prior Inpatient Therapy: No  Prior Outpatient Therapy Prior Outpatient Therapy: Yes Prior Therapy Dates: current  Prior Therapy Facilty/Provider(s): [facility] Reason for Treatment: MH/SA  Does patient have an ACCT team?: No Does patient have Intensive In-House Services?  : No Does patient have [facility] services? : No Does patient have P4CC services?: Unknown  ADL Screening (condition at time of admission) Is the patient deaf or have difficulty hearing?: No Does the patient have difficulty seeing, even when wearing glasses/contacts?:  No Does the patient have difficulty concentrating, remembering, or making decisions?: No Does the patient have difficulty dressing or bathing?: No Does the patient have difficulty walking or  climbing stairs?: No Weakness of Legs: None Weakness of Arms/Hands: None  Home Assistive Devices/Equipment Home Assistive Devices/Equipment: None  Therapy Consults (therapy consults require a physician order) PT Evaluation Needed: No OT Evalulation Needed: No SLP Evaluation Needed: No Abuse/Neglect Assessment (Assessment to be complete while patient is alone) Abuse/Neglect Assessment Can Be Completed: Yes Physical Abuse: Denies Verbal Abuse: Denies Sexual Abuse: Denies Exploitation of patient/patient's resources: Denies Self-Neglect: Denies Values / Beliefs Cultural Requests During Hospitalization: None Spiritual Requests During Hospitalization: None Consults Spiritual Care Consult Needed: No Transition of Care Team Consult Needed: No Advance Directives (For Healthcare) Does Patient Have a Medical Advance Directive?: No          Disposition:  Disposition Patient referred to: Other (Comment)  On Site Evaluation by:   Reviewed with Physician:    Shanon Ace 01/19/2020 6:19 PM

## 2020-01-19 NOTE — ED Triage Notes (Signed)
Pt arrives via POV for reports of needing "inpatient detox". PT states he was going to try and detox at home but now thinks he needs to detox in a facility due to increased alcohol intake and family stressors. PT states last drink was 4 hours ago and he drinks a 5th of liquor everyday. Pt cooperative and in NAD

## 2020-01-19 NOTE — Discharge Instructions (Addendum)
Please seek medical attention for any high fevers, chest pain, shortness of breath, change in behavior, persistent vomiting, bloody stool or any other new or concerning symptoms.  

## 2020-01-19 NOTE — ED Notes (Signed)
Pt requests to go home at this time. MD notified

## 2020-01-19 NOTE — ED Notes (Signed)
Pt vomited and nauseated, MD notified. Orders to follow

## 2020-01-19 NOTE — ED Notes (Addendum)
Pt leaves prior to signing discharge sheet, awaiting ride in waiting area. Requests to leave at this time

## 2020-02-14 ENCOUNTER — Other Ambulatory Visit: Payer: Self-pay

## 2020-02-14 ENCOUNTER — Emergency Department
Admission: EM | Admit: 2020-02-14 | Discharge: 2020-02-14 | Disposition: A | Discharge status: Home / Self Care | Payer: Medicaid Other | Attending: Emergency Medicine | Admitting: Emergency Medicine

## 2020-02-14 ENCOUNTER — Emergency Department: Payer: Medicaid Other

## 2020-02-14 DIAGNOSIS — R112 Nausea with vomiting, unspecified: Secondary | ICD-10-CM | POA: Diagnosis not present

## 2020-02-14 DIAGNOSIS — R079 Chest pain, unspecified: Secondary | ICD-10-CM | POA: Diagnosis present

## 2020-02-14 DIAGNOSIS — R509 Fever, unspecified: Secondary | ICD-10-CM | POA: Diagnosis not present

## 2020-02-14 DIAGNOSIS — Z5321 Procedure and treatment not carried out due to patient leaving prior to being seen by health care provider: Secondary | ICD-10-CM | POA: Diagnosis not present

## 2020-02-14 DIAGNOSIS — R197 Diarrhea, unspecified: Secondary | ICD-10-CM | POA: Diagnosis not present

## 2020-02-14 LAB — CBC
HCT: 42.3 % (ref 39.0–52.0)
Hemoglobin: 15.4 g/dL (ref 13.0–17.0)
MCH: 34.5 pg — ABNORMAL HIGH (ref 26.0–34.0)
MCHC: 36.4 g/dL — ABNORMAL HIGH (ref 30.0–36.0)
MCV: 94.6 fL (ref 80.0–100.0)
Platelets: 288 10*3/uL (ref 150–400)
RBC: 4.47 MIL/uL (ref 4.22–5.81)
RDW: 14.7 % (ref 11.5–15.5)
WBC: 6.1 10*3/uL (ref 4.0–10.5)
nRBC: 0 % (ref 0.0–0.2)

## 2020-02-14 LAB — BASIC METABOLIC PANEL
Anion gap: 4 — ABNORMAL LOW (ref 5–15)
BUN: 13 mg/dL (ref 6–20)
CO2: 28 mmol/L (ref 22–32)
Calcium: 8.5 mg/dL — ABNORMAL LOW (ref 8.9–10.3)
Chloride: 111 mmol/L (ref 98–111)
Creatinine, Ser: 1.05 mg/dL (ref 0.61–1.24)
GFR calc Af Amer: >60 mL/min (ref >60)
GFR calc non Af Amer: >60 mL/min (ref >60)
Glucose, Bld: 162 mg/dL — ABNORMAL HIGH (ref 70–99)
Potassium: 3.8 mmol/L (ref 3.5–5.1)
Sodium: 143 mmol/L (ref 135–145)

## 2020-02-14 LAB — TROPONIN I (HIGH SENSITIVITY): Troponin I (High Sensitivity): 3 ng/L (ref <18)

## 2020-02-14 NOTE — ED Triage Notes (Signed)
Patient c/o chest pain, fever (Tmax 104), N/V/D.

## 2020-04-24 ENCOUNTER — Emergency Department
Admission: EM | Admit: 2020-04-24 | Discharge: 2020-04-24 | Disposition: A | Discharge status: Home / Self Care | Payer: Medicaid Other | Attending: Emergency Medicine | Admitting: Emergency Medicine

## 2020-04-24 ENCOUNTER — Other Ambulatory Visit: Payer: Self-pay

## 2020-04-24 DIAGNOSIS — Z5321 Procedure and treatment not carried out due to patient leaving prior to being seen by health care provider: Secondary | ICD-10-CM | POA: Diagnosis not present

## 2020-04-24 DIAGNOSIS — R197 Diarrhea, unspecified: Secondary | ICD-10-CM | POA: Insufficient documentation

## 2020-04-24 DIAGNOSIS — R112 Nausea with vomiting, unspecified: Secondary | ICD-10-CM | POA: Insufficient documentation

## 2020-04-24 DIAGNOSIS — R109 Unspecified abdominal pain: Secondary | ICD-10-CM | POA: Insufficient documentation

## 2020-04-24 HISTORY — DX: Other psychoactive substance abuse, uncomplicated: F19.10

## 2020-04-24 HISTORY — DX: Alcohol abuse, uncomplicated: F10.10

## 2020-04-24 LAB — CBC
HCT: 44.4 % (ref 39.0–52.0)
Hemoglobin: 16.2 g/dL (ref 13.0–17.0)
MCH: 36.2 pg — ABNORMAL HIGH (ref 26.0–34.0)
MCHC: 36.5 g/dL — ABNORMAL HIGH (ref 30.0–36.0)
MCV: 99.1 fL (ref 80.0–100.0)
Platelets: 203 10*3/uL (ref 150–400)
RBC: 4.48 MIL/uL (ref 4.22–5.81)
RDW: 13.2 % (ref 11.5–15.5)
WBC: 8.1 10*3/uL (ref 4.0–10.5)
nRBC: 0 % (ref 0.0–0.2)

## 2020-04-24 LAB — COMPREHENSIVE METABOLIC PANEL
ALT: 169 U/L — ABNORMAL HIGH (ref 0–44)
AST: 266 U/L — ABNORMAL HIGH (ref 15–41)
Albumin: 4.4 g/dL (ref 3.5–5.0)
Alkaline Phosphatase: 67 U/L (ref 38–126)
Anion gap: 15 (ref 5–15)
BUN: 10 mg/dL (ref 6–20)
CO2: 19 mmol/L — ABNORMAL LOW (ref 22–32)
Calcium: 8.7 mg/dL — ABNORMAL LOW (ref 8.9–10.3)
Chloride: 100 mmol/L (ref 98–111)
Creatinine, Ser: 0.96 mg/dL (ref 0.61–1.24)
GFR calc Af Amer: >60 mL/min (ref >60)
GFR calc non Af Amer: >60 mL/min (ref >60)
Glucose, Bld: 177 mg/dL — ABNORMAL HIGH (ref 70–99)
Potassium: 3.2 mmol/L — ABNORMAL LOW (ref 3.5–5.1)
Sodium: 134 mmol/L — ABNORMAL LOW (ref 135–145)
Total Bilirubin: 0.8 mg/dL (ref 0.3–1.2)
Total Protein: 8.4 g/dL — ABNORMAL HIGH (ref 6.5–8.1)

## 2020-04-24 LAB — LIPASE, BLOOD: Lipase: 43 U/L (ref 11–51)

## 2020-04-24 NOTE — ED Triage Notes (Addendum)
Pt c/o abd pain with N/V/D for the past 3 days with body aches, states he does drink 1-2 days a week . Took a home covid test that was negative. Pt is clammy on arrival , also states he had a front tooth break off a few days ago and is causing some pain.,

## 2020-05-28 ENCOUNTER — Encounter: Payer: Self-pay | Admitting: Emergency Medicine

## 2020-05-28 ENCOUNTER — Emergency Department
Admission: EM | Admit: 2020-05-28 | Discharge: 2020-05-28 | Disposition: A | Discharge status: Home / Self Care | Payer: Medicaid Other | Attending: Emergency Medicine | Admitting: Emergency Medicine

## 2020-05-28 ENCOUNTER — Other Ambulatory Visit: Payer: Self-pay

## 2020-05-28 DIAGNOSIS — F10930 Alcohol use, unspecified with withdrawal, uncomplicated: Secondary | ICD-10-CM

## 2020-05-28 DIAGNOSIS — R110 Nausea: Secondary | ICD-10-CM | POA: Diagnosis not present

## 2020-05-28 DIAGNOSIS — R195 Other fecal abnormalities: Secondary | ICD-10-CM | POA: Insufficient documentation

## 2020-05-28 DIAGNOSIS — F10239 Alcohol dependence with withdrawal, unspecified: Secondary | ICD-10-CM | POA: Insufficient documentation

## 2020-05-28 DIAGNOSIS — F17200 Nicotine dependence, unspecified, uncomplicated: Secondary | ICD-10-CM | POA: Insufficient documentation

## 2020-05-28 DIAGNOSIS — F10230 Alcohol dependence with withdrawal, uncomplicated: Secondary | ICD-10-CM

## 2020-05-28 MED ORDER — LORAZEPAM 2 MG PO TABS
2.0000 mg | ORAL_TABLET | Freq: Once | ORAL | Status: AC
  Administered 2020-05-28: 2 mg via ORAL
  Filled 2020-05-28: qty 1

## 2020-05-28 MED ORDER — CHLORDIAZEPOXIDE HCL 25 MG PO CAPS: ORAL_CAPSULE | ORAL | 0 refills | Status: DC

## 2020-05-28 NOTE — ED Provider Notes (Signed)
[facility] Emergency Department Provider Note   ____________________________________________    I have reviewed the triage vital signs and the nursing notes.   HISTORY  Chief Complaint Withdrawal     HPI Nicholas Valdez is a 39 y.o. male with a history of alcohol abuse who reports that he relapsed 1 week ago.  He is here for help with detox.  He reports he has not had any alcohol in 11 hours.  This morning he woke up feeling slightly shaky with nausea and some loose stools.  Denies a history of seizures related to withdrawal.  Has not taken anything for this.  Past Medical History:  Diagnosis Date  . Alcohol abuse   . Drug abuse (HCC)   . Hepatitis C     Patient Active Problem List   Diagnosis Date Noted  . Alcohol withdrawal (HCC) 12/15/2018    History reviewed. No pertinent surgical history.  Prior to Admission medications   Medication Sig Start Date End Date Taking? Authorizing Provider  acetaminophen (TYLENOL) 325 MG tablet Take 650 mg by mouth every 6 (six) hours as needed.    [provider]  chlordiazePOXIDE (LIBRIUM) 25 MG capsule Day 1 - 50 mg every 6 hours Day 2 - 25 mg every 6 hours Day 3 - 25 mg twice a day Day 4- 25 mg at night 05/28/20   Jene Every, MD  escitalopram (LEXAPRO) 5 MG tablet Take 5 mg by mouth daily.    [provider]  folic acid (FOLVITE) 1 MG tablet Take by mouth. 11/25/19 11/24/20  [provider]  hydrOXYzine (ATARAX/VISTARIL) 25 MG tablet Take by mouth. 11/24/19   [provider]  melatonin 3 MG TABS tablet Take 3 mg by mouth at bedtime.  11/24/19   [provider]  Multiple Vitamins-Minerals (THERA-M) TABS Take 1 tablet by mouth daily. 11/25/19   [provider]  naltrexone (DEPADE) 50 MG tablet Take by mouth. 11/24/19 11/23/20  [provider]  nicotine (NICODERM CQ - DOSED IN MG/24 HOURS) 21 mg/24hr patch Place 2 patches on the skin daily.  11/24/19   [provider]  thiamine 100 MG tablet Take by mouth. 11/25/19 11/24/20  [provider]     Allergies Penicillins and Zofran [ondansetron hcl]  No family history on file.  Social History Social History   Tobacco Use  . Smoking status: Current Every Day Smoker  . Smokeless tobacco: Never Used  Vaping Use  . Vaping Use: Former  Substance Use Topics  . Alcohol use: Yes    Comment: Pt states that he usually drinks a pint of whiskey per day but has had none today.   . Drug use: Yes    Types: Marijuana    Review of Systems  Constitutional: As above Eyes: No visual changes.  ENT: No sore throat. Cardiovascular: Denies chest pain. Respiratory: Denies shortness of breath. Gastrointestinal: As above Genitourinary: Negative for dysuria. Musculoskeletal: Negative for back pain. Skin: Negative for rash. Neurological: Mild headache   ____________________________________________   PHYSICAL EXAM:  VITAL SIGNS: ED Triage Vitals  Enc Vitals Group     BP 05/28/20 0913 (!) 153/96     Pulse Rate 05/28/20 0913 (!) 103     Resp 05/28/20 0913 18     Temp 05/28/20 0913 98.6 F (37 C)     Temp Source 05/28/20 0913 Oral     SpO2 05/28/20 0913 98 %     Weight 05/28/20 0859 Marland Kitchen)  140.6 kg (310 lb)     Height 05/28/20 0859 1.778 m (5\' 10" )     Head Circumference --      Peak Flow --      Pain Score 05/28/20 0858 4     Pain Loc --      Pain Edu? --      Excl. in GC? --     Constitutional: Alert and oriented. No acute distress.   Nose: No congestion/rhinnorhea. Mouth/Throat: Mucous membranes are moist.   Neck:  Painless ROM Cardiovascular: regular rhythm. Grossly normal heart sounds.  Good peripheral circulation. Respiratory: Normal respiratory effort.  No retractions. Lungs CTAB. Gastrointestinal: Soft and nontender. No distention.  No CVA tenderness.  Musculoskeletal: No lower extremity tenderness nor edema.  Warm and well perfused Neurologic:   Normal speech and language. No gross focal neurologic deficits are appreciated.  Skin:  Skin is warm, dry and intact. No rash noted. Psychiatric: Mood and affect are normal. Speech and behavior are normal.  ____________________________________________   LABS (all labs ordered are listed, but only abnormal results are displayed)  Labs Reviewed - No data to display ____________________________________________  EKG   ____________________________________________  RADIOLOGY   ____________________________________________   PROCEDURES  Procedure(s) performed: No  Procedures   Critical Care performed: No ____________________________________________   INITIAL IMPRESSION / ASSESSMENT AND PLAN / ED COURSE  Pertinent labs & imaging results that were available during my care of the patient were reviewed by me and considered in my medical decision making (see chart for details).  Patient well-appearing and in no acute distress.  No signs of severe withdrawal, given p.o. Ativan here for symptomatic relief.  We will start the patient on Librium taper which has been successful for him in the past.  Also recommend outpatient follow-up with [facility]    ____________________________________________   FINAL CLINICAL IMPRESSION(S) / ED DIAGNOSES  Final diagnoses:  Alcohol withdrawal syndrome without complication (HCC)        Note:  This document was prepared using Dragon voice recognition software and may include unintentional dictation errors.   05/30/20, MD 05/28/20 1007

## 2020-05-28 NOTE — ED Notes (Signed)
Pt assessed by provider prior to d/c.  

## 2020-05-28 NOTE — ED Triage Notes (Signed)
Pt reports is attempting to detox from alcohol, last drink was 11 hours ago. Pt reports NV and some shaking

## 2020-06-10 ENCOUNTER — Encounter: Payer: Self-pay | Admitting: Emergency Medicine

## 2020-06-10 ENCOUNTER — Emergency Department
Admission: EM | Admit: 2020-06-10 | Discharge: 2020-06-10 | Disposition: A | Discharge status: Home / Self Care | Payer: Medicaid Other | Attending: Emergency Medicine | Admitting: Emergency Medicine

## 2020-06-10 ENCOUNTER — Other Ambulatory Visit: Payer: Self-pay

## 2020-06-10 DIAGNOSIS — F1020 Alcohol dependence, uncomplicated: Secondary | ICD-10-CM | POA: Diagnosis not present

## 2020-06-10 DIAGNOSIS — F10239 Alcohol dependence with withdrawal, unspecified: Secondary | ICD-10-CM | POA: Diagnosis present

## 2020-06-10 DIAGNOSIS — E860 Dehydration: Secondary | ICD-10-CM | POA: Diagnosis not present

## 2020-06-10 DIAGNOSIS — F17200 Nicotine dependence, unspecified, uncomplicated: Secondary | ICD-10-CM | POA: Diagnosis not present

## 2020-06-10 LAB — COMPREHENSIVE METABOLIC PANEL
ALT: 40 U/L (ref 0–44)
AST: 41 U/L (ref 15–41)
Albumin: 4 g/dL (ref 3.5–5.0)
Alkaline Phosphatase: 51 U/L (ref 38–126)
Anion gap: 12 (ref 5–15)
BUN: 7 mg/dL (ref 6–20)
CO2: 20 mmol/L — ABNORMAL LOW (ref 22–32)
Calcium: 8.6 mg/dL — ABNORMAL LOW (ref 8.9–10.3)
Chloride: 105 mmol/L (ref 98–111)
Creatinine, Ser: 0.85 mg/dL (ref 0.61–1.24)
GFR, Estimated: >60 mL/min (ref >60)
Glucose, Bld: 123 mg/dL — ABNORMAL HIGH (ref 70–99)
Potassium: 3.2 mmol/L — ABNORMAL LOW (ref 3.5–5.1)
Sodium: 137 mmol/L (ref 135–145)
Total Bilirubin: 0.9 mg/dL (ref 0.3–1.2)
Total Protein: 7.3 g/dL (ref 6.5–8.1)

## 2020-06-10 LAB — CBC
HCT: 37.2 % — ABNORMAL LOW (ref 39.0–52.0)
Hemoglobin: 13.2 g/dL (ref 13.0–17.0)
MCH: 36.7 pg — ABNORMAL HIGH (ref 26.0–34.0)
MCHC: 35.5 g/dL (ref 30.0–36.0)
MCV: 103.3 fL — ABNORMAL HIGH (ref 80.0–100.0)
Platelets: 221 10*3/uL (ref 150–400)
RBC: 3.6 MIL/uL — ABNORMAL LOW (ref 4.22–5.81)
RDW: 14.8 % (ref 11.5–15.5)
WBC: 10.2 10*3/uL (ref 4.0–10.5)
nRBC: 0 % (ref 0.0–0.2)

## 2020-06-10 LAB — SALICYLATE LEVEL: Salicylate Lvl: <7 mg/dL — ABNORMAL LOW (ref 7.0–30.0)

## 2020-06-10 LAB — ETHANOL: Alcohol, Ethyl (B): 48 mg/dL — ABNORMAL HIGH (ref <10)

## 2020-06-10 LAB — ACETAMINOPHEN LEVEL: Acetaminophen (Tylenol), Serum: <10 ug/mL — ABNORMAL LOW (ref 10–30)

## 2020-06-10 MED ORDER — THIAMINE HCL 100 MG PO TABS
100.0000 mg | ORAL_TABLET | Freq: Once | ORAL | Status: AC
  Administered 2020-06-10: 100 mg via ORAL
  Filled 2020-06-10: qty 1

## 2020-06-10 MED ORDER — LACTATED RINGERS IV BOLUS
1000.0000 mL | Freq: Once | INTRAVENOUS | Status: AC
  Administered 2020-06-10: 1000 mL via INTRAVENOUS

## 2020-06-10 MED ORDER — LORAZEPAM 2 MG/ML IJ SOLN: 1.0000 mg | Freq: Once | INTRAMUSCULAR | Status: DC

## 2020-06-10 MED ORDER — CHLORDIAZEPOXIDE HCL 25 MG PO CAPS: 25.0000 mg | ORAL_CAPSULE | Freq: Once | ORAL | Status: DC

## 2020-06-10 MED ORDER — ADULT MULTIVITAMIN W/MINERALS CH
1.0000 | ORAL_TABLET | Freq: Once | ORAL | Status: AC
  Administered 2020-06-10: 1 via ORAL
  Filled 2020-06-10: qty 1

## 2020-06-10 MED ORDER — POTASSIUM CHLORIDE 20 MEQ PO PACK
40.0000 meq | PACK | Freq: Once | ORAL | Status: AC
  Administered 2020-06-10: 40 meq via ORAL
  Filled 2020-06-10: qty 2

## 2020-06-10 NOTE — ED Triage Notes (Signed)
Pt presents to ED via POV with c/o alcohol detox, pt states has a bed coming open in approx 2 weeks at [facility] due to have marijuanna in his system. Pt states was seen at [facility] the other night and was prescribed a librium taper but has lost his librium taper pack.  Pt states last drink was approx 8 hrs ago. Pt holding up his hands in triage and states "here you go", pt with mild tremor noted to bilateral hands that resolves when he is not holding his hands up. Pt also states, "I think it's the hepatic encephalopathy", pt A&O x4, able to speak in full and complete sentences at this time.

## 2020-06-10 NOTE — ED Provider Notes (Signed)
[facility] Emergency Department Provider Note ____________________________________________   First MD Initiated Contact with Patient 06/10/20 1524     (approximate)  I have reviewed the triage vital signs and the nursing notes.  HISTORY  Chief Complaint Alcohol Detox   HPI Nicholas Valdez is a 39 y.o. malewho presents to the ED for evaluation of getting a new Librium taper  Chart review indicates history of alcoholism and hep C. Patient seen in our ED 2 weeks ago for withdrawals and provided outpatient Librium taper prescription. Visit 2 days ago in a different health system, or patient was provided another prescription for Librium taper.  Patient presents to the ED requesting another prescription for Librium.  Indicates that he was provided prescriptions for thiamine, multivitamin and Librium in the [facility] healthcare system 2 days ago.  He reports feeling this medication and last having Librium yesterday.  He reports he lost the rest of his Librium pills and indicates he does not know what happened to them.  He reports he still has his other prescriptions.  He refuses to elaborate more on this in terms of what could have happened to it.  He reports his last drink was 8 hours ago.  Denies falls, syncope, fever, trauma, chest pain, abdominal pain or emesis.  He reports eating breakfast this morning without abdominal pain or emesis.  He reports chronic loose stools.  Denies suicidality, homicidality or hallucinations.  Past Medical History:  Diagnosis Date  . Alcohol abuse   . Drug abuse (HCC)   . Hepatitis C     Patient Active Problem List   Diagnosis Date Noted  . Alcohol withdrawal (HCC) 12/15/2018    History reviewed. No pertinent surgical history.  Prior to Admission medications   Medication Sig Start Date End Date Taking? Authorizing Provider  acetaminophen (TYLENOL) 325 MG tablet Take 650 mg by mouth every 6 (six) hours as needed.    [provider]  chlordiazePOXIDE (LIBRIUM) 25 MG capsule Day 1 - 50 mg every 6 hours Day 2 - 25 mg every 6 hours Day 3 - 25 mg twice a day Day 4- 25 mg at night 05/28/20   Jene Every, MD  escitalopram (LEXAPRO) 5 MG tablet Take 5 mg by mouth daily.    [provider]  folic acid (FOLVITE) 1 MG tablet Take by mouth. 11/25/19 11/24/20  [provider]  hydrOXYzine (ATARAX/VISTARIL) 25 MG tablet Take by mouth. 11/24/19   [provider]  melatonin 3 MG TABS tablet Take 3 mg by mouth at bedtime.  11/24/19   [provider]  Multiple Vitamins-Minerals (THERA-M) TABS Take 1 tablet by mouth daily. 11/25/19   [provider]  naltrexone (DEPADE) 50 MG tablet Take by mouth. 11/24/19 11/23/20  [provider]  nicotine (NICODERM CQ - DOSED IN MG/24 HOURS) 21 mg/24hr patch Place 2 patches on the skin daily. 11/24/19   [provider]  thiamine 100 MG tablet Take by mouth. 11/25/19 11/24/20  [provider]    Allergies Penicillins and Zofran Frazier Richards hcl]  History reviewed. No pertinent family history.  Social History Social History   Tobacco Use  . Smoking status: Current Every Day Smoker  . Smokeless tobacco: Never Used  Vaping Use  . Vaping Use: Former  Substance Use Topics  . Alcohol use: Yes    Comment: Pt states that he usually drinks a pint of whiskey per day but has had none today.   . Drug use:  Yes    Types: Marijuana    Review of Systems  Constitutional: No fever/chills Eyes: No visual changes. ENT: No sore throat. Cardiovascular: Denies chest pain. Respiratory: Denies shortness of breath. Gastrointestinal: No abdominal pain.  No nausea, no vomiting.  No constipation. Positive for chronic diarrhea. Genitourinary: Negative for dysuria. Musculoskeletal: Negative for back pain. Skin: Negative for rash. Neurological: Negative for headaches, focal weakness or  numbness. __________________________________________   PHYSICAL EXAM:  VITAL SIGNS: Vitals:   06/10/20 1519  BP: (!) 137/92  Pulse: (!) 117  Resp: 20  Temp: 98.4 F (36.9 C)  SpO2: 97%     Constitutional: Alert and oriented. Well appearing and in no acute distress.  Obese.  Conversational full sentences without distress. Eyes: Conjunctivae are normal. PERRL. EOMI. Head: Atraumatic. Nose: No congestion/rhinnorhea. Mouth/Throat: Mucous membranes are moist.  Oropharynx non-erythematous.  No tongue fasciculations. Neck: No stridor. No cervical spine tenderness to palpation. Cardiovascular: Normal rate, regular rhythm. Grossly normal heart sounds.  Good peripheral circulation.  Not tachycardic. Respiratory: Normal respiratory effort.  No retractions. Lungs CTAB. Gastrointestinal: Soft , nondistended, nontender to palpation. No CVA tenderness.  Benign abdomen. Musculoskeletal: No lower extremity tenderness nor edema.  No joint effusions. No signs of acute trauma. Neurologic:  Normal speech and language. No gross focal neurologic deficits are appreciated. No gait instability noted. Ambulatory with a normal gait. Skin:  Skin is warm, dry and intact. No rash noted. Psychiatric: Mood and affect are normal. Speech and behavior are normal.  ____________________________________________   LABS (all labs ordered are listed, but only abnormal results are displayed)  Labs Reviewed  COMPREHENSIVE METABOLIC PANEL - Abnormal; Notable for the following components:      Result Value   Potassium 3.2 (*)    CO2 20 (*)    Glucose, Bld 123 (*)    Calcium 8.6 (*)    All other components within normal limits  ETHANOL - Abnormal; Notable for the following components:   Alcohol, Ethyl (B) 48 (*)    All other components within normal limits  SALICYLATE LEVEL - Abnormal; Notable for the following components:   Salicylate Lvl <7.0 (*)    All other components within normal limits  ACETAMINOPHEN  LEVEL - Abnormal; Notable for the following components:   Acetaminophen (Tylenol), Serum <10 (*)    All other components within normal limits  CBC - Abnormal; Notable for the following components:   RBC 3.60 (*)    HCT 37.2 (*)    MCV 103.3 (*)    MCH 36.7 (*)    All other components within normal limits  URINE DRUG SCREEN, QUALITATIVE ([facility] ONLY)   ____________________________________________   PROCEDURES and INTERVENTIONS  Procedure(s) performed (including Critical Care):  Procedures  Medications  lactated ringers bolus 1,000 mL (0 mLs Intravenous Stopped 06/10/20 1718)  thiamine tablet 100 mg (100 mg Oral Given 06/10/20 1647)  multivitamin with minerals tablet 1 tablet (1 tablet Oral Given 06/10/20 1646)  potassium chloride (KLOR-CON) packet 40 mEq (40 mEq Oral Given 06/10/20 1647)    ____________________________________________   MDM / ED COURSE   39 year old male with alcoholism presents to the ED requesting a Librium taper, and I see no evidence of pathology that requires this, and amenable to outpatient management thereafter.  Patient tachycardic in triage, but he has no tachycardia during my exam and my multiple reassessments, otherwise normal vital signs on room air.  Exam shows no evidence of withdrawals.  He has no tremulousness, no tongue fasciculations or tachycardia.  He is pleasant and conversational in full sentences without any distress, signs of trauma or neurovascular deficits.  His blood work has a mild decrease in his bicarb and potassium, which was repleted orally and IV.  He pulled out his IV prior to completing 1 L bolus, but is tolerating p.o. intake without any complications.  He is requesting Ativan, Librium because of his "hepatic encephalopathy" that he has no evidence of.  He certainly does not appear to be withdrawing acutely and I see no indications for benzodiazepines.  Throughout his course in the ED, I have increasing concerns for malingering, as  documented below.  He has no evidence of psychiatric emergencies to require IVC or hospitalization.  I provided patient with multiple outpatient resources for mental health and substance abuse.  We thoroughly discussed return precautions for the ED.  Patient medically stable for discharge.   Clinical Course as of Jun 10 1857  Sat Jun 10, 2020  1628 Going to reassess another patient, patient is asleep without tachycardia and snoring without discomfort.  He has not received any Ativan, fluids or medications while in the ED.  I am concerned that he is in no significant alcohol withdrawal and is shopping around for benzodiazepines.  We will continue to monitor him to provide him with IV fluids for his signs of dehydration, but he has no needs at this time for Ativan.  Orders adjusted appropriately.   [DS]  1656 Patient pulled out his IV while in the bathroom.  He indicates that he does not know what happened.  Patient received about half a bag of fluids, and has been tolerating p.o. intake with his oral potassium and thiamine/multivitamin.  He is not tachycardic upon my multiple reassessments and he is frequently sleeping.  I am concerned he is malingering.  I see no indications to reinitiate benzodiazepine course.  I provide him with outpatient resources for substance abuse and mental health.  I discussed with him return precautions for the ED.  Patient medically stable for discharge   [DS]    Clinical Course User Index [DS] Delton Prairie, MD    ____________________________________________   FINAL CLINICAL IMPRESSION(S) / ED DIAGNOSES  Final diagnoses:  Alcoholism (HCC)  Dehydration     ED Discharge Orders    None       Garrell Flagg   Note:  This document was prepared using Dragon voice recognition software and may include unintentional dictation errors.   Delton Prairie, MD 06/10/20 [phone]

## 2020-06-10 NOTE — ED Notes (Signed)
Patient to the ED for alcohol detox problems. States he was seen at [facility] a couple of days and has lost his Librium taper. Had it the first day then not anymore. Patient also states he "has terrible diarrhea" as a result; "hepatic encephalopathy" is causing him difficulty finding the right words he wants to use. Patient holds his hands up for this RN to see tremor which is not readily apparent. Patient is alert and oriented, cooperative at this time.

## 2020-06-10 NOTE — ED Notes (Signed)
Patient has removed his own IV while in the bathroom. Will notify MD

## 2020-06-10 NOTE — ED Notes (Signed)
Patient ambulatory to the bathroom without assistance.

## 2020-06-10 NOTE — Discharge Instructions (Signed)
Return to the ED with any worsening symptoms or concerns for withdrawals. Return to the ED with any seizures, episodes of passing out or fevers.  Attached with phone number for [facility] health services

## 2020-06-10 NOTE — ED Notes (Signed)
Pt arm and hand cleaned of blood from IV and bandaged.

## 2020-06-19 ENCOUNTER — Other Ambulatory Visit: Payer: Self-pay

## 2020-06-19 ENCOUNTER — Emergency Department: Payer: Medicaid Other

## 2020-06-19 ENCOUNTER — Inpatient Hospital Stay
Admission: EM | Admit: 2020-06-19 | Discharge: 2020-08-04 | DRG: 004 | Disposition: A | Discharge status: Rehabilitation Facility | Payer: Medicaid Other | Attending: Internal Medicine | Admitting: Internal Medicine

## 2020-06-19 ENCOUNTER — Encounter: Payer: Self-pay | Admitting: *Deleted

## 2020-06-19 DIAGNOSIS — H748X3 Other specified disorders of middle ear and mastoid, bilateral: Secondary | ICD-10-CM | POA: Diagnosis not present

## 2020-06-19 DIAGNOSIS — E874 Mixed disorder of acid-base balance: Secondary | ICD-10-CM | POA: Diagnosis present

## 2020-06-19 DIAGNOSIS — I5031 Acute diastolic (congestive) heart failure: Secondary | ICD-10-CM | POA: Diagnosis not present

## 2020-06-19 DIAGNOSIS — E870 Hyperosmolality and hypernatremia: Secondary | ICD-10-CM | POA: Diagnosis not present

## 2020-06-19 DIAGNOSIS — G928 Other toxic encephalopathy: Secondary | ICD-10-CM | POA: Diagnosis present

## 2020-06-19 DIAGNOSIS — D7589 Other specified diseases of blood and blood-forming organs: Secondary | ICD-10-CM | POA: Diagnosis not present

## 2020-06-19 DIAGNOSIS — J9601 Acute respiratory failure with hypoxia: Secondary | ICD-10-CM | POA: Diagnosis present

## 2020-06-19 DIAGNOSIS — F10231 Alcohol dependence with withdrawal delirium: Secondary | ICD-10-CM | POA: Diagnosis present

## 2020-06-19 DIAGNOSIS — J690 Pneumonitis due to inhalation of food and vomit: Secondary | ICD-10-CM | POA: Diagnosis not present

## 2020-06-19 DIAGNOSIS — N170 Acute kidney failure with tubular necrosis: Secondary | ICD-10-CM | POA: Diagnosis present

## 2020-06-19 DIAGNOSIS — R7989 Other specified abnormal findings of blood chemistry: Secondary | ICD-10-CM | POA: Diagnosis present

## 2020-06-19 DIAGNOSIS — B370 Candidal stomatitis: Secondary | ICD-10-CM | POA: Diagnosis not present

## 2020-06-19 DIAGNOSIS — R509 Fever, unspecified: Secondary | ICD-10-CM | POA: Diagnosis not present

## 2020-06-19 DIAGNOSIS — Z720 Tobacco use: Secondary | ICD-10-CM | POA: Diagnosis present

## 2020-06-19 DIAGNOSIS — Z978 Presence of other specified devices: Secondary | ICD-10-CM

## 2020-06-19 DIAGNOSIS — R0902 Hypoxemia: Secondary | ICD-10-CM

## 2020-06-19 DIAGNOSIS — Y848 Other medical procedures as the cause of abnormal reaction of the patient, or of later complication, without mention of misadventure at the time of the procedure: Secondary | ICD-10-CM | POA: Diagnosis not present

## 2020-06-19 DIAGNOSIS — R4587 Impulsiveness: Secondary | ICD-10-CM | POA: Diagnosis not present

## 2020-06-19 DIAGNOSIS — E876 Hypokalemia: Secondary | ICD-10-CM | POA: Diagnosis not present

## 2020-06-19 DIAGNOSIS — E8809 Other disorders of plasma-protein metabolism, not elsewhere classified: Secondary | ICD-10-CM | POA: Diagnosis not present

## 2020-06-19 DIAGNOSIS — G479 Sleep disorder, unspecified: Secondary | ICD-10-CM | POA: Diagnosis not present

## 2020-06-19 DIAGNOSIS — Z789 Other specified health status: Secondary | ICD-10-CM

## 2020-06-19 DIAGNOSIS — Z931 Gastrostomy status: Secondary | ICD-10-CM | POA: Diagnosis not present

## 2020-06-19 DIAGNOSIS — L8990 Pressure ulcer of unspecified site, unspecified stage: Secondary | ICD-10-CM | POA: Insufficient documentation

## 2020-06-19 DIAGNOSIS — Z09 Encounter for follow-up examination after completed treatment for conditions other than malignant neoplasm: Secondary | ICD-10-CM

## 2020-06-19 DIAGNOSIS — B182 Chronic viral hepatitis C: Secondary | ICD-10-CM | POA: Diagnosis not present

## 2020-06-19 DIAGNOSIS — E7220 Disorder of urea cycle metabolism, unspecified: Secondary | ICD-10-CM

## 2020-06-19 DIAGNOSIS — K5900 Constipation, unspecified: Secondary | ICD-10-CM | POA: Diagnosis not present

## 2020-06-19 DIAGNOSIS — F17200 Nicotine dependence, unspecified, uncomplicated: Secondary | ICD-10-CM | POA: Diagnosis present

## 2020-06-19 DIAGNOSIS — I110 Hypertensive heart disease with heart failure: Secondary | ICD-10-CM | POA: Diagnosis present

## 2020-06-19 DIAGNOSIS — E669 Obesity, unspecified: Secondary | ICD-10-CM | POA: Diagnosis not present

## 2020-06-19 DIAGNOSIS — G9341 Metabolic encephalopathy: Secondary | ICD-10-CM | POA: Diagnosis not present

## 2020-06-19 DIAGNOSIS — K7040 Alcoholic hepatic failure without coma: Secondary | ICD-10-CM | POA: Diagnosis present

## 2020-06-19 DIAGNOSIS — J168 Pneumonia due to other specified infectious organisms: Secondary | ICD-10-CM | POA: Diagnosis not present

## 2020-06-19 DIAGNOSIS — R6521 Severe sepsis with septic shock: Secondary | ICD-10-CM | POA: Diagnosis not present

## 2020-06-19 DIAGNOSIS — D638 Anemia in other chronic diseases classified elsewhere: Secondary | ICD-10-CM | POA: Diagnosis not present

## 2020-06-19 DIAGNOSIS — Z6841 Body Mass Index (BMI) 40.0 and over, adult: Secondary | ICD-10-CM

## 2020-06-19 DIAGNOSIS — Y95 Nosocomial condition: Secondary | ICD-10-CM | POA: Diagnosis not present

## 2020-06-19 DIAGNOSIS — R739 Hyperglycemia, unspecified: Secondary | ICD-10-CM | POA: Diagnosis not present

## 2020-06-19 DIAGNOSIS — F10931 Alcohol use, unspecified with withdrawal delirium: Secondary | ICD-10-CM | POA: Diagnosis present

## 2020-06-19 DIAGNOSIS — A488 Other specified bacterial diseases: Secondary | ICD-10-CM | POA: Diagnosis not present

## 2020-06-19 DIAGNOSIS — Z20822 Contact with and (suspected) exposure to covid-19: Secondary | ICD-10-CM | POA: Diagnosis present

## 2020-06-19 DIAGNOSIS — J9602 Acute respiratory failure with hypercapnia: Secondary | ICD-10-CM | POA: Diagnosis present

## 2020-06-19 DIAGNOSIS — I428 Other cardiomyopathies: Secondary | ICD-10-CM | POA: Diagnosis not present

## 2020-06-19 DIAGNOSIS — Z79899 Other long term (current) drug therapy: Secondary | ICD-10-CM

## 2020-06-19 DIAGNOSIS — F10139 Alcohol abuse with withdrawal, unspecified: Secondary | ICD-10-CM | POA: Diagnosis not present

## 2020-06-19 DIAGNOSIS — R1310 Dysphagia, unspecified: Secondary | ICD-10-CM | POA: Diagnosis not present

## 2020-06-19 DIAGNOSIS — D649 Anemia, unspecified: Secondary | ICD-10-CM | POA: Diagnosis not present

## 2020-06-19 DIAGNOSIS — K700 Alcoholic fatty liver: Secondary | ICD-10-CM | POA: Diagnosis present

## 2020-06-19 DIAGNOSIS — J189 Pneumonia, unspecified organism: Secondary | ICD-10-CM | POA: Diagnosis not present

## 2020-06-19 DIAGNOSIS — Z9911 Dependence on respirator [ventilator] status: Secondary | ICD-10-CM | POA: Diagnosis not present

## 2020-06-19 DIAGNOSIS — A419 Sepsis, unspecified organism: Secondary | ICD-10-CM | POA: Diagnosis not present

## 2020-06-19 DIAGNOSIS — F1210 Cannabis abuse, uncomplicated: Secondary | ICD-10-CM | POA: Diagnosis present

## 2020-06-19 DIAGNOSIS — D6959 Other secondary thrombocytopenia: Secondary | ICD-10-CM | POA: Diagnosis present

## 2020-06-19 DIAGNOSIS — D696 Thrombocytopenia, unspecified: Secondary | ICD-10-CM | POA: Diagnosis not present

## 2020-06-19 DIAGNOSIS — Z930 Tracheostomy status: Secondary | ICD-10-CM

## 2020-06-19 DIAGNOSIS — Z781 Physical restraint status: Secondary | ICD-10-CM

## 2020-06-19 DIAGNOSIS — J95851 Ventilator associated pneumonia: Secondary | ICD-10-CM | POA: Diagnosis not present

## 2020-06-19 DIAGNOSIS — J9600 Acute respiratory failure, unspecified whether with hypoxia or hypercapnia: Secondary | ICD-10-CM

## 2020-06-19 DIAGNOSIS — M79672 Pain in left foot: Secondary | ICD-10-CM | POA: Diagnosis not present

## 2020-06-19 DIAGNOSIS — G9340 Encephalopathy, unspecified: Secondary | ICD-10-CM | POA: Diagnosis not present

## 2020-06-19 DIAGNOSIS — Z4659 Encounter for fitting and adjustment of other gastrointestinal appliance and device: Secondary | ICD-10-CM

## 2020-06-19 DIAGNOSIS — J9690 Respiratory failure, unspecified, unspecified whether with hypoxia or hypercapnia: Secondary | ICD-10-CM | POA: Diagnosis not present

## 2020-06-19 DIAGNOSIS — G312 Degeneration of nervous system due to alcohol: Secondary | ICD-10-CM | POA: Diagnosis not present

## 2020-06-19 DIAGNOSIS — B1920 Unspecified viral hepatitis C without hepatic coma: Secondary | ICD-10-CM | POA: Diagnosis present

## 2020-06-19 DIAGNOSIS — E46 Unspecified protein-calorie malnutrition: Secondary | ICD-10-CM | POA: Diagnosis not present

## 2020-06-19 DIAGNOSIS — F10229 Alcohol dependence with intoxication, unspecified: Secondary | ICD-10-CM | POA: Diagnosis present

## 2020-06-19 DIAGNOSIS — R609 Edema, unspecified: Secondary | ICD-10-CM | POA: Diagnosis not present

## 2020-06-19 DIAGNOSIS — Z0189 Encounter for other specified special examinations: Secondary | ICD-10-CM

## 2020-06-19 HISTORY — DX: Depression, unspecified: F32.A

## 2020-06-19 HISTORY — DX: Essential (primary) hypertension: I10

## 2020-06-19 HISTORY — DX: Prediabetes: R73.03

## 2020-06-19 HISTORY — DX: Sleep apnea, unspecified: G47.30

## 2020-06-19 HISTORY — DX: Pneumonia, unspecified organism: J18.9

## 2020-06-19 HISTORY — DX: Gastro-esophageal reflux disease without esophagitis: K21.9

## 2020-06-19 HISTORY — DX: Anxiety disorder, unspecified: F41.9

## 2020-06-19 LAB — PHOSPHORUS: Phosphorus: 4.7 mg/dL — ABNORMAL HIGH (ref 2.5–4.6)

## 2020-06-19 LAB — CBC
HCT: 36.9 % — ABNORMAL LOW (ref 39.0–52.0)
Hemoglobin: 13.3 g/dL (ref 13.0–17.0)
MCH: 36.9 pg — ABNORMAL HIGH (ref 26.0–34.0)
MCHC: 36 g/dL (ref 30.0–36.0)
MCV: 102.5 fL — ABNORMAL HIGH (ref 80.0–100.0)
Platelets: 287 10*3/uL (ref 150–400)
RBC: 3.6 MIL/uL — ABNORMAL LOW (ref 4.22–5.81)
RDW: 15.4 % (ref 11.5–15.5)
WBC: 5.5 10*3/uL (ref 4.0–10.5)
nRBC: 0 % (ref 0.0–0.2)

## 2020-06-19 LAB — COMPREHENSIVE METABOLIC PANEL
ALT: 45 U/L — ABNORMAL HIGH (ref 0–44)
AST: 54 U/L — ABNORMAL HIGH (ref 15–41)
Albumin: 3.6 g/dL (ref 3.5–5.0)
Alkaline Phosphatase: 47 U/L (ref 38–126)
Anion gap: 11 (ref 5–15)
BUN: 7 mg/dL (ref 6–20)
CO2: 21 mmol/L — ABNORMAL LOW (ref 22–32)
Calcium: 7.8 mg/dL — ABNORMAL LOW (ref 8.9–10.3)
Chloride: 106 mmol/L (ref 98–111)
Creatinine, Ser: 0.8 mg/dL (ref 0.61–1.24)
GFR, Estimated: >60 mL/min (ref >60)
Glucose, Bld: 137 mg/dL — ABNORMAL HIGH (ref 70–99)
Potassium: 3.5 mmol/L (ref 3.5–5.1)
Sodium: 138 mmol/L (ref 135–145)
Total Bilirubin: 0.8 mg/dL (ref 0.3–1.2)
Total Protein: 7.1 g/dL (ref 6.5–8.1)

## 2020-06-19 LAB — PROTIME-INR
INR: 1 (ref 0.8–1.2)
Prothrombin Time: 13.2 seconds (ref 11.4–15.2)

## 2020-06-19 LAB — BLOOD GAS, VENOUS
Acid-base deficit: 1.5 mmol/L (ref 0.0–2.0)
Bicarbonate: 20.6 mmol/L (ref 20.0–28.0)
O2 Saturation: 99.6 %
Patient temperature: 37
pCO2, Ven: 27 mmHg — ABNORMAL LOW (ref 44.0–60.0)
pH, Ven: 7.49 — ABNORMAL HIGH (ref 7.250–7.430)
pO2, Ven: 168 mmHg — ABNORMAL HIGH (ref 32.0–45.0)

## 2020-06-19 LAB — CK: Total CK: 138 U/L (ref 49–397)

## 2020-06-19 LAB — MAGNESIUM: Magnesium: 1.9 mg/dL (ref 1.7–2.4)

## 2020-06-19 LAB — ETHANOL: Alcohol, Ethyl (B): 15 mg/dL — ABNORMAL HIGH (ref <10)

## 2020-06-19 LAB — LACTIC ACID, PLASMA: Lactic Acid, Venous: 3.3 mmol/L (ref 0.5–1.9)

## 2020-06-19 LAB — BETA-HYDROXYBUTYRIC ACID: Beta-Hydroxybutyric Acid: 0.1 mmol/L (ref 0.05–0.27)

## 2020-06-19 LAB — AMMONIA: Ammonia: 76 umol/L — ABNORMAL HIGH (ref 9–35)

## 2020-06-19 MED ORDER — SODIUM CHLORIDE 0.9 % IV BOLUS
1000.0000 mL | Freq: Once | INTRAVENOUS | Status: AC
  Administered 2020-06-19: 1000 mL via INTRAVENOUS

## 2020-06-19 MED ORDER — LORAZEPAM 2 MG/ML IJ SOLN
1.0000 mg | Freq: Once | INTRAMUSCULAR | Status: AC
  Administered 2020-06-19: 1 mg via INTRAVENOUS
  Filled 2020-06-19: qty 1

## 2020-06-19 MED ORDER — PANTOPRAZOLE SODIUM 40 MG IV SOLR
40.0000 mg | Freq: Once | INTRAVENOUS | Status: AC
  Administered 2020-06-19: 40 mg via INTRAVENOUS
  Filled 2020-06-19: qty 40

## 2020-06-19 MED ORDER — LORAZEPAM 2 MG PO TABS: 0.0000 mg | ORAL_TABLET | Freq: Four times a day (QID) | ORAL | Status: DC

## 2020-06-19 MED ORDER — LORAZEPAM 2 MG/ML IJ SOLN: 1.0000 mg | Freq: Once | INTRAMUSCULAR | Status: DC

## 2020-06-19 MED ORDER — THIAMINE HCL 100 MG/ML IJ SOLN
100.0000 mg | Freq: Every day | INTRAMUSCULAR | Status: DC
  Administered 2020-06-20: 100 mg via INTRAVENOUS
  Filled 2020-06-19: qty 2

## 2020-06-19 MED ORDER — THIAMINE HCL 100 MG/ML IJ SOLN
100.0000 mg | Freq: Once | INTRAMUSCULAR | Status: AC
  Administered 2020-06-19: 100 mg via INTRAVENOUS
  Filled 2020-06-19: qty 2

## 2020-06-19 MED ORDER — NICOTINE 21 MG/24HR TD PT24
21.0000 mg | MEDICATED_PATCH | Freq: Every day | TRANSDERMAL | Status: DC
  Administered 2020-06-20 – 2020-06-29 (×10): 21 mg via TRANSDERMAL
  Filled 2020-06-19 (×10): qty 1

## 2020-06-19 MED ORDER — LORAZEPAM 2 MG PO TABS: 0.0000 mg | ORAL_TABLET | Freq: Two times a day (BID) | ORAL | Status: DC

## 2020-06-19 MED ORDER — LORAZEPAM 2 MG/ML IJ SOLN
1.0000 mg | INTRAMUSCULAR | Status: DC | PRN
  Administered 2020-06-20: 2 mg via INTRAVENOUS
  Administered 2020-06-20 (×2): 4 mg via INTRAVENOUS
  Administered 2020-06-20: 2 mg via INTRAVENOUS
  Filled 2020-06-19: qty 1

## 2020-06-19 MED ORDER — LORAZEPAM 1 MG PO TABS: 1.0000 mg | ORAL_TABLET | ORAL | Status: DC | PRN

## 2020-06-19 MED ORDER — LORAZEPAM 2 MG/ML IJ SOLN
0.0000 mg | Freq: Four times a day (QID) | INTRAMUSCULAR | Status: DC
  Administered 2020-06-19 – 2020-06-20 (×3): 2 mg via INTRAVENOUS
  Administered 2020-06-20: 4 mg via INTRAVENOUS
  Filled 2020-06-19 (×3): qty 1
  Filled 2020-06-19 (×2): qty 2
  Filled 2020-06-19: qty 1

## 2020-06-19 MED ORDER — SODIUM CHLORIDE 0.9 % IV SOLN
INTRAVENOUS | Status: DC
  Administered 2020-06-20: 50 mL/h via INTRAVENOUS

## 2020-06-19 MED ORDER — LORAZEPAM 2 MG/ML IJ SOLN
2.0000 mg | Freq: Once | INTRAMUSCULAR | Status: AC
  Administered 2020-06-19: 2 mg via INTRAVENOUS
  Filled 2020-06-19: qty 1

## 2020-06-19 MED ORDER — SODIUM CHLORIDE 0.9% FLUSH
3.0000 mL | Freq: Once | INTRAVENOUS | Status: AC
  Administered 2020-06-19: 3 mL via INTRAVENOUS

## 2020-06-19 MED ORDER — THIAMINE HCL 100 MG PO TABS
100.0000 mg | ORAL_TABLET | Freq: Every day | ORAL | Status: DC
  Administered 2020-06-21: 100 mg via ORAL
  Filled 2020-06-19: qty 1

## 2020-06-19 MED ORDER — LACTULOSE 10 GM/15ML PO SOLN
30.0000 g | Freq: Once | ORAL | Status: DC
  Administered 2020-06-20: 30 g via ORAL
  Filled 2020-06-19: qty 60

## 2020-06-19 MED ORDER — LORAZEPAM 2 MG PO TABS: 2.0000 mg | ORAL_TABLET | Freq: Once | ORAL | Status: DC

## 2020-06-19 MED ORDER — METOCLOPRAMIDE HCL 5 MG/ML IJ SOLN
5.0000 mg | Freq: Four times a day (QID) | INTRAMUSCULAR | Status: DC | PRN
  Administered 2020-06-20 (×2): 5 mg via INTRAVENOUS
  Filled 2020-06-19 (×2): qty 2

## 2020-06-19 MED ORDER — THIAMINE HCL 100 MG/ML IJ SOLN
500.0000 mg | Freq: Once | INTRAVENOUS | Status: AC
  Administered 2020-06-19: 500 mg via INTRAVENOUS
  Filled 2020-06-19: qty 5

## 2020-06-19 MED ORDER — LACTULOSE 10 GM/15ML PO SOLN
20.0000 g | Freq: Two times a day (BID) | ORAL | Status: DC
  Administered 2020-06-20 – 2020-06-21 (×2): 20 g via ORAL
  Filled 2020-06-19 (×2): qty 30

## 2020-06-19 MED ORDER — LORAZEPAM 2 MG/ML IJ SOLN: 0.0000 mg | Freq: Two times a day (BID) | INTRAMUSCULAR | Status: DC

## 2020-06-19 NOTE — ED Notes (Signed)
XR stated that pt was attempting to get out of bed for them as well. Unable to reorient pt.

## 2020-06-19 NOTE — ED Notes (Signed)
Yellow socks and yellow wrist band placed on patient.

## 2020-06-19 NOTE — ED Notes (Signed)
Pt assisted to restroom x3 staff. Pt states that he has to have BM. Pt then spitting on wall while on toilet. Bathroom cleaned. Pt assisted back to bed and cleansed. Pt unable to provide urine sample.

## 2020-06-19 NOTE — ED Notes (Signed)
Pt now admitting he drank two bottles of wine tonight at 1800.

## 2020-06-19 NOTE — ED Notes (Signed)
Report given to Chrissy, RN

## 2020-06-19 NOTE — ED Notes (Signed)
Patient transported to CT 

## 2020-06-19 NOTE — ED Notes (Signed)
Pt found standing in room, all wires pulled tight and IV backflowed with blood. Pt completely disoriented. Unable to do 2+2. Provider aware. Pt moved to hall for safety and more closely monitoring.

## 2020-06-19 NOTE — ED Provider Notes (Signed)
[facility] Emergency Department Provider Note  ____________________________________________   First MD Initiated Contact with Patient 06/19/20 1949     (approximate)  I have reviewed the triage vital signs and the nursing notes.   HISTORY  Chief Complaint Altered Mental Status and Alcohol Intoxication    HPI Nicholas Valdez is a 39 y.o. male with past medical history of chronic alcohol abuse here with reported altered mental status.  Patient reportedly went to [facility] today for inpatient detox.  He reportedly was confused and altered at the time.  He subsequent sent to the ED for evaluation.  Initially, he denied any alcohol use.  However, on my questioning, he does admit to drinking fairly heavily before going [facility].  He states that he is, however, interested in stopping, but has had significant difficulty with this in the past.  He states that he feels mildly nauseous but this is not uncommon for him when he drinks.  No vomiting.  Denies any recent falls or head injuries.  No headache.  No other drug use.  No other complaints.  Remainder of history limited due to intoxication.        Past Medical History:  Diagnosis Date  . Alcohol abuse   . Drug abuse (HCC)   . Hepatitis C     Patient Active Problem List   Diagnosis Date Noted  . Alcohol withdrawal (HCC) 12/15/2018    History reviewed. No pertinent surgical history.  Prior to Admission medications   Medication Sig Start Date End Date Taking? Authorizing Provider  acetaminophen (TYLENOL) 325 MG tablet Take 650 mg by mouth every 6 (six) hours as needed.    [provider]  chlordiazePOXIDE (LIBRIUM) 25 MG capsule Day 1 - 50 mg every 6 hours Day 2 - 25 mg every 6 hours Day 3 - 25 mg twice a day Day 4- 25 mg at night 05/28/20   Jene Every, MD  escitalopram (LEXAPRO) 5 MG tablet Take 5 mg by mouth daily.    [provider]  folic acid (FOLVITE) 1 MG tablet Take by mouth. 11/25/19  11/24/20  [provider]  hydrOXYzine (ATARAX/VISTARIL) 25 MG tablet Take by mouth. 11/24/19   [provider]  melatonin 3 MG TABS tablet Take 3 mg by mouth at bedtime.  11/24/19   [provider]  Multiple Vitamins-Minerals (THERA-M) TABS Take 1 tablet by mouth daily. 11/25/19   [provider]  naltrexone (DEPADE) 50 MG tablet Take by mouth. 11/24/19 11/23/20  [provider]  nicotine (NICODERM CQ - DOSED IN MG/24 HOURS) 21 mg/24hr patch Place 2 patches on the skin daily. 11/24/19   [provider]  thiamine 100 MG tablet Take by mouth. 11/25/19 11/24/20  [provider]    Allergies Penicillins and Zofran Frazier Richards hcl]  History reviewed. No pertinent family history.  Social History Social History   Tobacco Use  . Smoking status: Current Every Day Smoker  . Smokeless tobacco: Never Used  Vaping Use  . Vaping Use: Former  Substance Use Topics  . Alcohol use: Yes    Comment: Pt states that he usually drinks a pint of whiskey per day but has had none today.   . Drug use: Yes    Types: Marijuana    Review of Systems  Review of Systems  Constitutional: Negative for chills and fever.  HENT: Negative for sore throat.   Respiratory: Negative for shortness of breath.   Cardiovascular: Negative for chest pain.  Gastrointestinal: Negative for abdominal pain.  Genitourinary: Negative for flank pain.  Musculoskeletal: Negative for neck pain.  Skin: Negative for rash and wound.  Allergic/Immunologic: Negative for immunocompromised state.  Neurological: Negative for weakness and numbness.  Hematological: Does not bruise/bleed easily.  Psychiatric/Behavioral: Positive for confusion.     ____________________________________________  PHYSICAL EXAM:      VITAL SIGNS: ED Triage Vitals  Enc Vitals Group     BP 06/19/20 1936 (!) 141/86     Pulse Rate 06/19/20 1936 93     Resp 06/19/20 1936 16     Temp 06/19/20 1936 98 F  (36.7 C)     Temp Source 06/19/20 1936 Oral     SpO2 06/19/20 1926 98 %     Weight --      Height --      Head Circumference --      Peak Flow --      Pain Score --      Pain Loc --      Pain Edu? --      Excl. in GC? --      Physical Exam Vitals and nursing note reviewed.  Constitutional:      General: He is not in acute distress.    Appearance: He is well-developed.     Comments: Smells of alcohol, slightly disheveled  HENT:     Head: Normocephalic and atraumatic.  Eyes:     Conjunctiva/sclera: Conjunctivae normal.  Cardiovascular:     Rate and Rhythm: Normal rate and regular rhythm.     Heart sounds: Normal heart sounds. No murmur heard.  No friction rub.  Pulmonary:     Effort: Pulmonary effort is normal. No respiratory distress.     Breath sounds: Normal breath sounds. No wheezing or rales.  Abdominal:     General: There is no distension.     Palpations: Abdomen is soft.     Tenderness: There is no abdominal tenderness.  Musculoskeletal:     Cervical back: Neck supple.  Skin:    General: Skin is warm.     Capillary Refill: Capillary refill takes less than 2 seconds.  Neurological:     Mental Status: He is alert.     Motor: No abnormal muscle tone.     Comments: CN intact. MAE with 5/5 strength. Normal sensation to light touch. Gait deferred. Speech slightly slurred. Oriented to person, place, situation but not time (does not know exact day but knows it is Nov 2021).       ____________________________________________   LABS (all labs ordered are listed, but only abnormal results are displayed)  Labs Reviewed  COMPREHENSIVE METABOLIC PANEL - Abnormal; Notable for the following components:      Result Value   CO2 21 (*)    Glucose, Bld 137 (*)    Calcium 7.8 (*)    AST 54 (*)    ALT 45 (*)    All other components within normal limits  CBC - Abnormal; Notable for the following components:   RBC 3.60 (*)    HCT 36.9 (*)    MCV 102.5 (*)    MCH 36.9  (*)    All other components within normal limits  ETHANOL - Abnormal; Notable for the following components:   Alcohol, Ethyl (B) 15 (*)    All other components within normal limits  AMMONIA - Abnormal; Notable for the following components:   Ammonia 76 (*)    All other components within normal limits  RESPIRATORY PANEL BY RT PCR (FLU A&B, COVID)  URINE DRUG SCREEN, QUALITATIVE ([facility] ONLY)    ____________________________________________  EKG: Normal sinus rhythm, ventricular rate 91.  PR 162, QRS 112, QTc 469.  Incomplete right bundle branch block.  No acute ST elevations or depressions. ________________________________________  RADIOLOGY All imaging, including plain films, CT scans, and ultrasounds, independently reviewed by me, and interpretations confirmed via formal radiology reads.  ED MD interpretation:     Official radiology report(s): CT Head Wo Contrast  Result Date: 06/19/2020 CLINICAL DATA:  Mental status change EXAM: CT HEAD WITHOUT CONTRAST TECHNIQUE: Contiguous axial images were obtained from the base of the skull through the vertex without intravenous contrast. COMPARISON:  January 20, 2007 FINDINGS: Brain: No evidence of acute territorial infarction, hemorrhage, hydrocephalus,extra-axial collection or mass lesion/mass effect. There is a hypodense area seen within the right basal ganglia which could represent an enlarging perivascular space seen on the prior exam dating back to 2008. Normal gray-white differentiation. Ventricles are normal in size and contour. Vascular: No hyperdense vessel or unexpected calcification. Skull: The skull is intact. No fracture or focal lesion identified. Sinuses/Orbits: The visualized paranasal sinuses and mastoid air cells are clear. The orbits and globes intact. Other: None IMPRESSION: No acute intracranial abnormality. Electronically Signed   By: Jonna Clark M.D.   On: 06/19/2020 21:21     ____________________________________________  PROCEDURES   Procedure(s) performed (including Critical Care):  .Critical Care Performed by: Shaune Pollack, MD Authorized by: Shaune Pollack, MD   Critical care provider statement:    Critical care time (minutes):  35   Critical care time was exclusive of:  Separately billable procedures and treating other patients and teaching time   Critical care was necessary to treat or prevent imminent or life-threatening deterioration of the following conditions:  Circulatory failure and cardiac failure   Critical care was time spent personally by me on the following activities:  Development of treatment plan with patient or surrogate, discussions with consultants, evaluation of patient's response to treatment, examination of patient, obtaining history from patient or surrogate, ordering and performing treatments and interventions, ordering and review of laboratory studies, ordering and review of radiographic studies, pulse oximetry, re-evaluation of patient's condition and review of old charts   I assumed direction of critical care for this patient from another provider in my specialty: no      ____________________________________________  INITIAL IMPRESSION / MDM / ASSESSMENT AND PLAN / ED COURSE  As part of my medical decision making, I reviewed the following data within the electronic MEDICAL RECORD NUMBER Nursing notes reviewed and incorporated, Old chart reviewed, Notes from prior ED visits, and [place] Controlled Substance Database       *Nicholas Valdez was evaluated in Emergency Department on 06/19/2020 for the symptoms described in the history of present illness. He was evaluated in the context of the global COVID-19 pandemic, which necessitated consideration that the patient might be at risk for infection with the SARS-CoV-2 virus that causes COVID-19. Institutional protocols and algorithms that pertain to the evaluation of patients at risk  for COVID-19 are in a state of rapid change based on information released by regulatory bodies including the CDC and federal and state organizations. These policies and algorithms were followed during the patient's care in the ED.  Some ED evaluations and interventions may be delayed as a result of limited staffing during the pandemic.*     Medical Decision Making:  39 yo M here with AMS in setting of  chronic alcohol abuse, desiring detox. Clinically, suspect alcohol w/d with delirium. EtOH only 15 which is likely much lower than usual. He is mildly tremulous, tachycardic, and delirious. No seizures. Pt given ativan, fluids. CBC w/o leukocytosis - doubt meningitis, encephalitis. CMP is c/w his known chronic liver disease. Of note, ammonia 76 - will start lactulose. CT head shows NAICA. No focal neuro deficits.  Will admit for complicated alcohol w/d, hyperammonemia.  ____________________________________________  FINAL CLINICAL IMPRESSION(S) / ED DIAGNOSES  Final diagnoses:  Hyperammonemia (HCC)  Alcohol withdrawal syndrome, with delirium (HCC)     MEDICATIONS GIVEN DURING THIS VISIT:  Medications  LORazepam (ATIVAN) injection 1 mg (has no administration in time range)  LORazepam (ATIVAN) injection 0-4 mg (2 mg Intravenous Given 06/19/20 2108)    Or  LORazepam (ATIVAN) tablet 0-4 mg ( Oral See Alternative 06/19/20 2108)  LORazepam (ATIVAN) injection 0-4 mg (has no administration in time range)    Or  LORazepam (ATIVAN) tablet 0-4 mg (has no administration in time range)  thiamine tablet 100 mg ( Oral See Alternative 06/19/20 2108)    Or  thiamine (B-1) injection 100 mg (100 mg Intravenous Not Given 06/19/20 2108)  lactulose (CHRONULAC) 10 GM/15ML solution 30 g (has no administration in time range)  sodium chloride flush (NS) 0.9 % injection 3 mL (3 mLs Intravenous Given 06/19/20 2000)  sodium chloride 0.9 % bolus 1,000 mL (1,000 mLs Intravenous New Bag/Given 06/19/20 2019)  thiamine  (B-1) injection 100 mg (100 mg Intravenous Given 06/19/20 2020)  pantoprazole (PROTONIX) injection 40 mg (40 mg Intravenous Given 06/19/20 2019)  LORazepam (ATIVAN) injection 1 mg (1 mg Intravenous Given 06/19/20 2043)     ED Discharge Orders    None       Note:  This document was prepared using Dragon voice recognition software and may include unintentional dictation errors.   Shaune Pollack, MD 06/19/20 2133

## 2020-06-19 NOTE — H&P (Signed)
SI Nicholas Valdez:[id number] DOB: [birth year] DOA: 06/19/2020     PCP: Oswaldo Conroy, MD   Outpatient Specialists:   NONE   Patient arrived to ER on 06/19/20 at 1919 Referred by Attending Shaune Pollack, MD   Patient coming from: home  Chief Complaint:   Chief Complaint  Patient presents with  . Altered Mental Status  . Alcohol Intoxication    HPI: Nicholas Valdez is a 39 y.o. male with medical history significant of EtOH and Hep C    Presented with significant altered mental status Patient attempted to present to [facility] today for inpatient detox but was found to be confused and was sent to emergency department Was not sure when was the last time he drink any alcohol. He was brought in by EMS with nausea and altered mental status Was seen in the emergency department just 2 weeks ago for alcohol withdrawal and start on Librium protocol At baseline reportedly drinks about a pint of whiskey per day Patient did not endorse any vomiting no recent falls or head injuries no headache At some point patient states that he drank two bottles of wine today  Infectious risk factors:  Reports nausea    Has NOt been vaccinated against COVID    Initial COVID TEST  NEGATIVE   Lab Results  Component Value Date   SARSCOV2NAA NEGATIVE 06/19/2020   SARSCOV2NAA NOT DETECTED 09/09/2019   SARSCOV2NAA NEGATIVE 12/15/2018     Regarding pertinent Chronic problems:  Morbid obesity-   BMI Readings from Last 1 Encounters:  06/19/20 45.93 kg/m      Liver disease Computed MELD-Na score unavailable. Necessary lab results were not found in the last year. Computed MELD score unavailable. Necessary lab results were not found in the last year.   While in ER: Noted to have ammonia of 76 alcohol level 15 Mildly elevated LFTs    CT head nonacute Hospitalist was called for admission for severe alcohol withdrawal resulting acute mental status changes and delirium  The following  Work up has been ordered so far:  Orders Placed This Encounter  Procedures  . Critical Care  . Respiratory Panel by RT PCR (Flu A&B, Covid) - Nasopharyngeal Swab  . CT Head Wo Contrast  . Comprehensive metabolic panel  . CBC  . Ethanol  . Urine Drug Screen, Qualitative ([facility] only)  . Ammonia  . Diet NPO time specified  . Document Height and Actual Weight  . Neuro checks q 2 hours x12 hours  . Saline Lock IV, Maintain IV access  . Clinical institute withdrawal assessment  . Clinical institute withdrawal assessment every 6 hours X 48 hours, then every 12 hours x 48 hours  . Notify MD if CIWA-AR is greater than 10 for 2 consecutive assessments.  . May administer Ativan PO versus IV if patient is tolerating PO intake well  . Vital signs every 6 hours X 48 hours, then per unit protocol  . Consult to hospitalist  ALL PATIENTS BEING ADMITTED/HAVING PROCEDURES NEED COVID-19 SCREENING  . ED EKG  . EKG 12-Lead     Following Medications were ordered in ER: Medications  LORazepam (ATIVAN) injection 0-4 mg (2 mg Intravenous Given 06/19/20 2108)    Or  LORazepam (ATIVAN) tablet 0-4 mg ( Oral See Alternative 06/19/20 2108)  LORazepam (ATIVAN) injection 0-4 mg (has no administration in time range)    Or  LORazepam (ATIVAN) tablet 0-4 mg (has no administration in time range)  thiamine tablet 100 mg (  Oral See Alternative 06/19/20 2108)    Or  thiamine (B-1) injection 100 mg (100 mg Intravenous Not Given 06/19/20 2108)  lactulose (CHRONULAC) 10 GM/15ML solution 30 g (has no administration in time range)  LORazepam (ATIVAN) injection 2 mg (has no administration in time range)  sodium chloride flush (NS) 0.9 % injection 3 mL (3 mLs Intravenous Given 06/19/20 2000)  sodium chloride 0.9 % bolus 1,000 mL (0 mLs Intravenous Stopped 06/19/20 2129)  thiamine (B-1) injection 100 mg (100 mg Intravenous Given 06/19/20 2020)  pantoprazole (PROTONIX) injection 40 mg (40 mg Intravenous Given 06/19/20 2019)   LORazepam (ATIVAN) injection 1 mg (1 mg Intravenous Given 06/19/20 2043)        Consult Orders  (From admission, onward)         Start     Ordered   06/19/20 2132  Consult to hospitalist  ALL PATIENTS BEING ADMITTED/HAVING PROCEDURES NEED COVID-19 SCREENING  Once       Comments: ALL PATIENTS BEING ADMITTED/HAVING PROCEDURES NEED COVID-19 SCREENING  Provider:  (Not yet assigned)  Question Answer Comment  Place call to: Hospitalist   Reason for Consult Admit      06/19/20 2131          Significant initial  Findings: Abnormal Labs Reviewed  COMPREHENSIVE METABOLIC PANEL - Abnormal; Notable for the following components:      Result Value   CO2 21 (*)    Glucose, Bld 137 (*)    Calcium 7.8 (*)    AST 54 (*)    ALT 45 (*)    All other components within normal limits  CBC - Abnormal; Notable for the following components:   RBC 3.60 (*)    HCT 36.9 (*)    MCV 102.5 (*)    MCH 36.9 (*)    All other components within normal limits  ETHANOL - Abnormal; Notable for the following components:   Alcohol, Ethyl (B) 15 (*)    All other components within normal limits  AMMONIA - Abnormal; Notable for the following components:   Ammonia 76 (*)    All other components within normal limits   Otherwise labs showing:    Recent Labs  Lab 06/19/20 1953  NA 138  K 3.5  CO2 21*  GLUCOSE 137*  BUN 7  CREATININE 0.80  CALCIUM 7.8*    Cr  stable,    Lab Results  Component Value Date   CREATININE 0.80 06/19/2020   CREATININE 0.85 06/10/2020   CREATININE 0.96 04/24/2020    Recent Labs  Lab 06/19/20 1953  AST 54*  ALT 45*  ALKPHOS 47  BILITOT 0.8  PROT 7.1  ALBUMIN 3.6   Lab Results  Component Value Date   CALCIUM 7.8 (L) 06/19/2020      WBC      Component Value Date/Time   WBC 5.5 06/19/2020 1953   LYMPHSABS 2.8 01/10/2020 0036   MONOABS 0.9 01/10/2020 0036   EOSABS 0.1 01/10/2020 0036   BASOSABS 0.1 01/10/2020 0036   Plt: Lab Results  Component Value  Date   PLT 287 06/19/2020    Lactic Acid, Venous    Component Value Date/Time   LATICACIDVEN 3.3 (HH) 06/19/2020 2209      Venous  Blood Gas result:  pH 7.49  pCO2 27  PO2 168  ABG    Component Value Date/Time   HCO3 20.6 06/19/2020 2158   ACIDBASEDEF 1.5 06/19/2020 2158   O2SAT 99.6 06/19/2020 2158    HG/HCT  Stable     Component Value Date/Time   HGB 13.3 06/19/2020 1953   HGB 16.1 02/12/2013 2028   HCT 36.9 (L) 06/19/2020 1953   HCT 47.2 02/12/2013 2028   MCV 102.5 (H) 06/19/2020 1953   MCV 95 02/12/2013 2028    No results for input(s): LIPASE, AMYLASE in the last 168 hours. Recent Labs  Lab 06/19/20 2043  AMMONIA 76*      Cardiac Panel (last 3 results) Recent Labs    06/19/20 2209  CKTOTAL 138       ECG: Ordered Personally reviewed by me showing: HR : 91 Rhythm: NSR,    no evidence of ischemic changes QTC 469   BNP (last 3 results) Recent Labs    09/13/19 1559  BNP 25.0      UA   no evidence of UTI      Urine analysis:    Component Value Date/Time   COLORURINE AMBER (A) 01/10/2020 0122   APPEARANCEUR HAZY (A) 01/10/2020 0122   APPEARANCEUR Hazy 02/12/2013 2028   LABSPEC 1.030 01/10/2020 0122   LABSPEC 1.019 02/12/2013 2028   PHURINE 6.0 01/10/2020 0122   GLUCOSEU NEGATIVE 01/10/2020 0122   GLUCOSEU Negative 02/12/2013 2028   HGBUR NEGATIVE 01/10/2020 0122   BILIRUBINUR NEGATIVE 01/10/2020 0122   BILIRUBINUR Negative 02/12/2013 2028   KETONESUR NEGATIVE 01/10/2020 0122   PROTEINUR 100 (A) 01/10/2020 0122   NITRITE NEGATIVE 01/10/2020 0122   LEUKOCYTESUR NEGATIVE 01/10/2020 0122   LEUKOCYTESUR Negative 02/12/2013 2028      Ordered  CT HEAD  NON acute  CXR - atelectasis      ED Triage Vitals  Enc Vitals Group     BP 06/19/20 1936 (!) 141/86     Pulse Rate 06/19/20 1936 93     Resp 06/19/20 1936 16     Temp 06/19/20 1936 98 F (36.7 C)     Temp Source 06/19/20 1936 Oral     SpO2 06/19/20 1926 98 %     Weight 06/19/20 1954  (!) 320 lb 1.7 oz (145.2 kg)     Height 06/19/20 1954 5\' 10"  (1.778 m)     Head Circumference --      Peak Flow --      Pain Score 06/19/20 1954 0     Pain Loc --      Pain Edu? --      Excl. in GC? --   TMAX(24)@       Latest  Blood pressure (!) 135/112, pulse 87, temperature 98 F (36.7 C), temperature source Oral, resp. rate (!) 23, height 5\' 10"  (1.778 m), weight (!) 145.2 kg, SpO2 100 %.    Review of Systems:    Pertinent positives include:  confusion  Constitutional:  No weight loss, night sweats, Fevers, chills, fatigue, weight loss  HEENT:  No headaches, Difficulty swallowing,Tooth/dental problems,Sore throat,  No sneezing, itching, ear ache, nasal congestion, post nasal drip,  Cardio-vascular:  No chest pain, Orthopnea, PND, anasarca, dizziness, palpitations.no Bilateral lower extremity swelling  GI:  No heartburn, indigestion, abdominal pain, nausea, vomiting, diarrhea, change in bowel habits, loss of appetite, melena, blood in stool, hematemesis Resp:  no shortness of breath at rest. No dyspnea on exertion, No excess mucus, no productive cough, No non-productive cough, No coughing up of blood.No change in color of mucus.No wheezing. Skin:  no rash or lesions. No jaundice GU:  no dysuria, change in color of urine, no urgency or frequency. No straining to urinate.  No  flank pain.  Musculoskeletal:  No joint pain or no joint swelling. No decreased range of motion. No back pain.  Psych:  No change in mood or affect. No depression or anxiety. No memory loss.  Neuro: no localizing neurological complaints, no tingling, no weakness, no double vision, no gait abnormality, no slurred speech, no   All systems reviewed and apart from HOPI all are negative  Past Medical History:   Past Medical History:  Diagnosis Date  . Alcohol abuse   . Drug abuse (HCC)   . Hepatitis C       History reviewed. No pertinent surgical history.  Social History:  Ambulatory    Independently      reports that he has been smoking. He has never used smokeless tobacco. He reports current alcohol use. He reports current drug use. Drug: Marijuana.   Family History:   Family History  Problem Relation Age of Onset  . Hypertension Other     Allergies: Allergies  Allergen Reactions  . Penicillins     Did it involve swelling of the face/tongue/throat, SOB, or low BP? Yes Did it involve sudden or severe rash/hives, skin peeling, or any reaction on the inside of your mouth or nose? Yes Did you need to seek medical attention at a hospital or doctor's office? Yes When did it last happen?childhood If all above answers are "NO", may proceed with cephalosporin use.  Marland Kitchen Zofran [Ondansetron Hcl] Hives     Prior to Admission medications   Medication Sig Start Date End Date Taking? Authorizing Provider  acetaminophen (TYLENOL) 325 MG tablet Take 650 mg by mouth every 6 (six) hours as needed.    [provider]  chlordiazePOXIDE (LIBRIUM) 25 MG capsule Day 1 - 50 mg every 6 hours Day 2 - 25 mg every 6 hours Day 3 - 25 mg twice a day Day 4- 25 mg at night 05/28/20   Jene Every, MD  escitalopram (LEXAPRO) 5 MG tablet Take 5 mg by mouth daily.    [provider]  folic acid (FOLVITE) 1 MG tablet Take by mouth. 11/25/19 11/24/20  [provider]  hydrOXYzine (ATARAX/VISTARIL) 25 MG tablet Take by mouth. 11/24/19   [provider]  melatonin 3 MG TABS tablet Take 3 mg by mouth at bedtime.  11/24/19   [provider]  Multiple Vitamins-Minerals (THERA-M) TABS Take 1 tablet by mouth daily. 11/25/19   [provider]  naltrexone (DEPADE) 50 MG tablet Take by mouth. 11/24/19 11/23/20  [provider]  nicotine (NICODERM CQ - DOSED IN MG/24 HOURS) 21 mg/24hr patch Place 2 patches on the skin daily. 11/24/19   [provider]  thiamine 100 MG tablet Take by mouth. 11/25/19 11/24/20  [provider]    Physical Exam: Vitals with BMI 06/19/2020 06/19/2020 06/19/2020  Height - -   Weight - - 320 lbs 2 oz  BMI - - 45.93  Systolic 135 155 -  Diastolic 960 110 -  Pulse 87 80 -   1. General:  in No  Acute distress   Chronically ill  -appearing 2. Psychological: Alert and  Oriented to self 3. Head/ENT:     Dry Mucous Membranes                          Head Non traumatic, neck supple  Poor Dentition 4. SKIN:    decreased Skin turgor,  Skin clean Dry and intact no rash 5. Heart: Regular rate and rhythm no  Murmur, no Rub or gallop 6. Lungs:  no wheezes or crackles   7. Abdomen: Soft,  non-tender, Non distended   Obese bowel sounds present 8. Lower extremities: no clubbing, cyanosis, no  edema 9. Neurologically Grossly intact, moving all 4 extremities equally  10. MSK: Normal range of motion   All other LABS:     Recent Labs  Lab 06/19/20 1953  WBC 5.5  HGB 13.3  HCT 36.9*  MCV 102.5*  PLT 287     Recent Labs  Lab 06/19/20 1953  NA 138  K 3.5  CL 106  CO2 21*  GLUCOSE 137*  BUN 7  CREATININE 0.80  CALCIUM 7.8*     Recent Labs  Lab 06/19/20 1953  AST 54*  ALT 45*  ALKPHOS 47  BILITOT 0.8  PROT 7.1  ALBUMIN 3.6       Cultures:    Component Value Date/Time   SDES  12/16/2018 0300    URINE, RANDOM Performed at [facility], [address], [place], [place] [address]    [facility]  12/16/2018 0300    NONE Performed at [facility], [address] [place], [place] [address]    CULT (A) 12/16/2018 0300    <10,000 COLONIES/mL INSIGNIFICANT GROWTH Performed at [facility], [address], [place], [place] [address]    REPTSTATUS 12/17/2018 FINAL 12/16/2018 0300     Radiological Exams on Admission: CT Head Wo Contrast  Result Date: 06/19/2020 CLINICAL DATA:  Mental status change EXAM: CT HEAD WITHOUT CONTRAST TECHNIQUE: Contiguous axial images were obtained from the base of the skull  through the vertex without intravenous contrast. COMPARISON:  January 20, 2007 FINDINGS: Brain: No evidence of acute territorial infarction, hemorrhage, hydrocephalus,extra-axial collection or mass lesion/mass effect. There is a hypodense area seen within the right basal ganglia which could represent an enlarging perivascular space seen on the prior exam dating back to 2008. Normal gray-white differentiation. Ventricles are normal in size and contour. Vascular: No hyperdense vessel or unexpected calcification. Skull: The skull is intact. No fracture or focal lesion identified. Sinuses/Orbits: The visualized paranasal sinuses and mastoid air cells are clear. The orbits and globes intact. Other: None IMPRESSION: No acute intracranial abnormality. Electronically Signed   By: Jonna Clark M.D.   On: 06/19/2020 21:21    Chart has been reviewed  Assessment/Plan  39 y.o. male with medical history significant of EtOH and Hep C    Admitted for EtOH withdrawal with acute delirium  Present on Admission: . Alcohol withdrawal delirium (HCC) -admit to stepdown initiate CIWA protocol make sure on thiamine and following Unfortunately may be at risk of requiring Precedex  . Increased ammonia level -administer lactulose repeat in a.m.  . Acute metabolic encephalopathy most likely in the setting of alcohol withdrawal delirium  . Elevated LFTs -history of alcohol abuse as well as hepatitis C. Check INR WNL Obtain right upper ultrasound to evaluate for hepatic steatosis or cirrhosis  . Tobacco abuse nicotine patch ordered  Elevated lactic acid -possibly secondary to dehydration no evidence of infectious process no white blood cell count or fever, impaired liver function could potentially lead to decreased clearance of lactic acid  Other plan as per orders.  DVT prophylaxis:  SCD   Code Status:    Code Status: Prior FULL CODE  Family Communication:  Family not at  Bedside   Disposition Plan:    To home  once workup is complete and patient is stable   Following barriers for discharge:                            Electrolytes corrected                                                        Will need to be able to tolerate PO                                                Transition of care consulted                                        Consults called: none  Admission status:  ED Disposition    ED Disposition Condition Comment   Admit  Hospital Area: [facility] [[id number]]  Level of Care: Stepdown [14]  Covid Evaluation: Confirmed COVID Negative  Diagnosis: Alcohol withdrawal delirium (HCC) [291.0.ICD-9-CM]  Admitting Physician: Therisa Doyne [[id number]]  Attending Physician: Therisa Doyne [[id number]]  Estimated length of stay: past midnight tomorrow  Certification:: I certify this patient will need inpatient services for at least 2 midnights        inpatient     I Expect 2 midnight stay secondary to severity of patient's current illness need for inpatient interventions justified by the following:  hemodynamic instability despite optimal treatment (  tachypnea  )  Severe lab/radiological/exam abnormalities including:    elevated ammonia and extensive comorbidities including:  substance abuse    liver disease .    That are currently affecting medical management.   I expect  patient to be hospitalized for 2 midnights requiring inpatient medical care.  Patient is at high risk for adverse outcome (such as loss of life or disability) if not treated.  Indication for inpatient stay as follows:  Severe change from baseline regarding mental status    Need for IV fluids, IV benzodiazepam   Level of care      SDU tele indefinitely please discontinue once patient no longer qualifies COVID-19 Labs    Lab Results  Component Value Date   SARSCOV2NAA NEGATIVE 06/19/2020     Precautions: admitted as   Covid Negative  No active isolations    PPE: Used by  the provider:    P100  eye Goggles,  Gloves  gown     Nadege Carriger 06/20/2020, 12:55 AM    [facility]     after 2 AM please page floor coverage PA If 7AM-7PM, please contact the day team taking care of the patient using [email or web address]   Patient was evaluated in the context of the global COVID-19 pandemic, which necessitated consideration that the patient might be at risk for infection with the SARS-CoV-2 virus that causes COVID-19. Institutional protocols and algorithms that pertain to the evaluation of patients at risk for COVID-19 are in a state of rapid change based on information released by regulatory bodies including the  CDC and federal and [employer]. These policies and algorithms were followed during the patient's care.

## 2020-06-19 NOTE — ED Triage Notes (Signed)
Went to [facility] today at 1pm; EMS brought in from [facility] for alt mental status and nausea; pt to lobby via w/c with no distress noted

## 2020-06-19 NOTE — ED Notes (Signed)
Pt snoring in hallway. Will continue to monitor for safety.

## 2020-06-19 NOTE — ED Notes (Signed)
Patient transported to X-ray 

## 2020-06-19 NOTE — ED Triage Notes (Signed)
Pt to ED from [facility]. When asked why he was in the ED pt responded, "I fell asleep when I wasn't supposed to." Pt denied to this RN having taken drugs or drinking alcohol today. Pt verbalized does not remember his last drink but pt is very altered in triage. When confronted about his altered mental status pt reports it is because of his "indegestion."   Pt continues to blame his fatigue on his indigestion as well. Pt is currently on Librium and also reports a hx of hep C.   Pt is very inconsistent with story and unable to remember conversations for longer than a couple minutes. Pt unable to sit till in triage.

## 2020-06-20 ENCOUNTER — Inpatient Hospital Stay: Payer: Medicaid Other

## 2020-06-20 DIAGNOSIS — F10231 Alcohol dependence with withdrawal delirium: Secondary | ICD-10-CM | POA: Diagnosis not present

## 2020-06-20 DIAGNOSIS — R7989 Other specified abnormal findings of blood chemistry: Secondary | ICD-10-CM

## 2020-06-20 DIAGNOSIS — D696 Thrombocytopenia, unspecified: Secondary | ICD-10-CM

## 2020-06-20 DIAGNOSIS — B182 Chronic viral hepatitis C: Secondary | ICD-10-CM

## 2020-06-20 DIAGNOSIS — G9341 Metabolic encephalopathy: Secondary | ICD-10-CM

## 2020-06-20 DIAGNOSIS — D7589 Other specified diseases of blood and blood-forming organs: Secondary | ICD-10-CM

## 2020-06-20 DIAGNOSIS — Z720 Tobacco use: Secondary | ICD-10-CM | POA: Diagnosis not present

## 2020-06-20 LAB — CBC WITH DIFFERENTIAL/PLATELET
Abs Immature Granulocytes: 0.02 10*3/uL (ref 0.00–0.07)
Basophils Absolute: 0.1 10*3/uL (ref 0.0–0.1)
Basophils Relative: 1 %
Eosinophils Absolute: 0.1 10*3/uL (ref 0.0–0.5)
Eosinophils Relative: 2 %
HCT: 37.4 % — ABNORMAL LOW (ref 39.0–52.0)
Hemoglobin: 13 g/dL (ref 13.0–17.0)
Immature Granulocytes: 0 %
Lymphocytes Relative: 31 %
Lymphs Abs: 2.5 10*3/uL (ref 0.7–4.0)
MCH: 36.5 pg — ABNORMAL HIGH (ref 26.0–34.0)
MCHC: 34.8 g/dL (ref 30.0–36.0)
MCV: 105.1 fL — ABNORMAL HIGH (ref 80.0–100.0)
Monocytes Absolute: 0.7 10*3/uL (ref 0.1–1.0)
Monocytes Relative: 9 %
Neutro Abs: 4.7 10*3/uL (ref 1.7–7.7)
Neutrophils Relative %: 57 %
Platelets: 229 10*3/uL (ref 150–400)
RBC: 3.56 MIL/uL — ABNORMAL LOW (ref 4.22–5.81)
RDW: 15.3 % (ref 11.5–15.5)
WBC: 8.2 10*3/uL (ref 4.0–10.5)
nRBC: 0 % (ref 0.0–0.2)

## 2020-06-20 LAB — COMPREHENSIVE METABOLIC PANEL
ALT: 46 U/L — ABNORMAL HIGH (ref 0–44)
AST: 62 U/L — ABNORMAL HIGH (ref 15–41)
Albumin: 3.4 g/dL — ABNORMAL LOW (ref 3.5–5.0)
Alkaline Phosphatase: 44 U/L (ref 38–126)
Anion gap: 11 (ref 5–15)
BUN: 7 mg/dL (ref 6–20)
CO2: 18 mmol/L — ABNORMAL LOW (ref 22–32)
Calcium: 7.3 mg/dL — ABNORMAL LOW (ref 8.9–10.3)
Chloride: 112 mmol/L — ABNORMAL HIGH (ref 98–111)
Creatinine, Ser: 0.88 mg/dL (ref 0.61–1.24)
GFR, Estimated: >60 mL/min (ref >60)
Glucose, Bld: 95 mg/dL (ref 70–99)
Potassium: 3.8 mmol/L (ref 3.5–5.1)
Sodium: 141 mmol/L (ref 135–145)
Total Bilirubin: 1 mg/dL (ref 0.3–1.2)
Total Protein: 6.7 g/dL (ref 6.5–8.1)

## 2020-06-20 LAB — MRSA PCR SCREENING: MRSA by PCR: NEGATIVE

## 2020-06-20 LAB — URINALYSIS, COMPLETE (UACMP) WITH MICROSCOPIC
Bacteria, UA: NONE SEEN
Bilirubin Urine: NEGATIVE
Glucose, UA: NEGATIVE mg/dL
Hgb urine dipstick: NEGATIVE
Ketones, ur: NEGATIVE mg/dL
Leukocytes,Ua: NEGATIVE
Nitrite: NEGATIVE
Protein, ur: NEGATIVE mg/dL
Specific Gravity, Urine: 1.015 (ref 1.005–1.030)
pH: 6 (ref 5.0–8.0)

## 2020-06-20 LAB — LACTIC ACID, PLASMA
Lactic Acid, Venous: 2.5 mmol/L (ref 0.5–1.9)
Lactic Acid, Venous: 2.1 mmol/L (ref 0.5–1.9)

## 2020-06-20 LAB — VITAMIN B12: Vitamin B-12: 88 pg/mL — ABNORMAL LOW (ref 180–914)

## 2020-06-20 LAB — RETICULOCYTES
Immature Retic Fract: 22.8 % — ABNORMAL HIGH (ref 2.3–15.9)
RBC.: 3.45 MIL/uL — ABNORMAL LOW (ref 4.22–5.81)
Retic Count, Absolute: 102.5 10*3/uL (ref 19.0–186.0)
Retic Ct Pct: 3 % (ref 0.4–3.1)

## 2020-06-20 LAB — GLUCOSE, CAPILLARY
Glucose-Capillary: 101 mg/dL — ABNORMAL HIGH (ref 70–99)
Glucose-Capillary: 124 mg/dL — ABNORMAL HIGH (ref 70–99)

## 2020-06-20 LAB — AMMONIA
Ammonia: 47 umol/L — ABNORMAL HIGH (ref 9–35)
Ammonia: 38 umol/L — ABNORMAL HIGH (ref 9–35)

## 2020-06-20 LAB — TRIGLYCERIDES: Triglycerides: 239 mg/dL — ABNORMAL HIGH (ref <150)

## 2020-06-20 LAB — IRON AND TIBC
Iron: 301 ug/dL — ABNORMAL HIGH (ref 45–182)
Saturation Ratios: 79 % — ABNORMAL HIGH (ref 17.9–39.5)
TIBC: 382 ug/dL (ref 250–450)
UIBC: 81 ug/dL

## 2020-06-20 LAB — URINE DRUG SCREEN, QUALITATIVE (ARMC ONLY)
Amphetamines, Ur Screen: NOT DETECTED
Barbiturates, Ur Screen: NOT DETECTED
Benzodiazepine, Ur Scrn: POSITIVE — AB
Cannabinoid 50 Ng, Ur ~~LOC~~: NOT DETECTED
Cocaine Metabolite,Ur ~~LOC~~: NOT DETECTED
MDMA (Ecstasy)Ur Screen: NOT DETECTED
Methadone Scn, Ur: NOT DETECTED
Opiate, Ur Screen: POSITIVE — AB
Phencyclidine (PCP) Ur S: NOT DETECTED
Tricyclic, Ur Screen: NOT DETECTED

## 2020-06-20 LAB — RESPIRATORY PANEL BY RT PCR (FLU A&B, COVID)
Influenza A by PCR: NEGATIVE
Influenza B by PCR: NEGATIVE
SARS Coronavirus 2 by RT PCR: NEGATIVE

## 2020-06-20 LAB — MAGNESIUM: Magnesium: 1.9 mg/dL (ref 1.7–2.4)

## 2020-06-20 LAB — TSH: TSH: 8.794 u[IU]/mL — ABNORMAL HIGH (ref 0.350–4.500)

## 2020-06-20 LAB — FOLATE: Folate: 6.3 ng/mL (ref >5.9)

## 2020-06-20 LAB — PHOSPHORUS: Phosphorus: 2.9 mg/dL (ref 2.5–4.6)

## 2020-06-20 LAB — FERRITIN: Ferritin: 88 ng/mL (ref 24–336)

## 2020-06-20 MED ORDER — SODIUM CHLORIDE 0.9 % IV BOLUS
1000.0000 mL | Freq: Once | INTRAVENOUS | Status: AC
  Administered 2020-06-20: 1000 mL via INTRAVENOUS

## 2020-06-20 MED ORDER — MIDAZOLAM HCL 2 MG/2ML IJ SOLN
INTRAMUSCULAR | Status: AC
  Administered 2020-06-20: 4 mg via INTRAVENOUS
  Filled 2020-06-20: qty 4

## 2020-06-20 MED ORDER — MIDAZOLAM HCL 2 MG/2ML IJ SOLN
2.0000 mg | Freq: Once | INTRAMUSCULAR | Status: AC
  Administered 2020-06-21: 2 mg via INTRAVENOUS
  Filled 2020-06-20: qty 2

## 2020-06-20 MED ORDER — MIDAZOLAM HCL 2 MG/2ML IJ SOLN
2.0000 mg | INTRAMUSCULAR | Status: AC | PRN
  Administered 2020-06-21 (×3): 2 mg via INTRAVENOUS
  Filled 2020-06-20 (×4): qty 2

## 2020-06-20 MED ORDER — FENTANYL 2500MCG IN NS 250ML (10MCG/ML) PREMIX INFUSION
0.0000 ug/h | INTRAVENOUS | Status: DC
  Administered 2020-06-20: 200 ug/h via INTRAVENOUS
  Administered 2020-06-21: 400 ug/h via INTRAVENOUS
  Administered 2020-06-21: 375 ug/h via INTRAVENOUS
  Administered 2020-06-21 (×2): 400 ug/h via INTRAVENOUS
  Administered 2020-06-22: 250 ug/h via INTRAVENOUS
  Filled 2020-06-20 (×4): qty 250

## 2020-06-20 MED ORDER — MIDAZOLAM HCL 2 MG/2ML IJ SOLN: 4.0000 mg | Freq: Once | INTRAMUSCULAR | Status: AC

## 2020-06-20 MED ORDER — CHLORHEXIDINE GLUCONATE 0.12% ORAL RINSE (MEDLINE KIT)
15.0000 mL | Freq: Two times a day (BID) | OROMUCOSAL | Status: DC
  Administered 2020-06-20 – 2020-07-27 (×74): 15 mL via OROMUCOSAL

## 2020-06-20 MED ORDER — ORAL CARE MOUTH RINSE: 15.0000 mL | OROMUCOSAL | Status: DC

## 2020-06-20 MED ORDER — ORAL CARE MOUTH RINSE
15.0000 mL | OROMUCOSAL | Status: DC
  Administered 2020-06-20 – 2020-07-27 (×357): 15 mL via OROMUCOSAL

## 2020-06-20 MED ORDER — MORPHINE SULFATE (PF) 2 MG/ML IV SOLN
2.0000 mg | INTRAVENOUS | Status: AC | PRN
  Administered 2020-06-20: 2 mg via INTRAVENOUS
  Filled 2020-06-20: qty 1

## 2020-06-20 MED ORDER — NOREPINEPHRINE 16 MG/250ML-% IV SOLN
0.0000 ug/min | INTRAVENOUS | Status: DC
  Administered 2020-06-20: 3 ug/min via INTRAVENOUS
  Administered 2020-06-21: 14 ug/min via INTRAVENOUS
  Filled 2020-06-20 (×3): qty 250

## 2020-06-20 MED ORDER — ETOMIDATE 2 MG/ML IV SOLN: 20.0000 mg | Freq: Once | INTRAVENOUS | Status: AC

## 2020-06-20 MED ORDER — PROPOFOL 1000 MG/100ML IV EMUL
INTRAVENOUS | Status: AC
  Administered 2020-06-20: 50 ug/kg/min via INTRAVENOUS
  Filled 2020-06-20: qty 100

## 2020-06-20 MED ORDER — ETOMIDATE 2 MG/ML IV SOLN
INTRAVENOUS | Status: AC
  Administered 2020-06-20: 20 mg via INTRAVENOUS
  Filled 2020-06-20: qty 10

## 2020-06-20 MED ORDER — FENTANYL 2500MCG IN NS 250ML (10MCG/ML) PREMIX INFUSION
50.0000 ug/h | INTRAVENOUS | Status: DC
  Filled 2020-06-20: qty 250

## 2020-06-20 MED ORDER — VECURONIUM BROMIDE 10 MG IV SOLR
10.0000 mg | Freq: Once | INTRAVENOUS | Status: AC
  Administered 2020-06-21: 10 mg via INTRAVENOUS

## 2020-06-20 MED ORDER — ROCURONIUM BROMIDE 50 MG/5ML IV SOLN: 100.0000 mg | Freq: Once | INTRAVENOUS | Status: AC

## 2020-06-20 MED ORDER — FENTANYL CITRATE (PF) 100 MCG/2ML IJ SOLN
50.0000 ug | Freq: Once | INTRAMUSCULAR | Status: AC
  Filled 2020-06-20: qty 2

## 2020-06-20 MED ORDER — ROCURONIUM BROMIDE 50 MG/5ML IV SOLN
INTRAVENOUS | Status: AC
  Administered 2020-06-20: 100 mg via INTRAVENOUS
  Filled 2020-06-20: qty 2

## 2020-06-20 MED ORDER — DEXMEDETOMIDINE HCL IN NACL 400 MCG/100ML IV SOLN
0.4000 ug/kg/h | INTRAVENOUS | Status: DC
  Administered 2020-06-20 (×4): 0.9 ug/kg/h via INTRAVENOUS
  Filled 2020-06-20 (×3): qty 100

## 2020-06-20 MED ORDER — CHLORHEXIDINE GLUCONATE 0.12% ORAL RINSE (MEDLINE KIT): 15.0000 mL | Freq: Two times a day (BID) | OROMUCOSAL | Status: DC

## 2020-06-20 MED ORDER — POLYETHYLENE GLYCOL 3350 17 G PO PACK
17.0000 g | PACK | Freq: Every day | ORAL | Status: DC
  Administered 2020-06-21: 17 g via ORAL
  Filled 2020-06-20: qty 1

## 2020-06-20 MED ORDER — VECURONIUM BROMIDE 10 MG IV SOLR
INTRAVENOUS | Status: AC
  Filled 2020-06-20: qty 10

## 2020-06-20 MED ORDER — FENTANYL BOLUS VIA INFUSION
50.0000 ug | INTRAVENOUS | Status: DC | PRN
  Administered 2020-06-21: 50 ug via INTRAVENOUS
  Filled 2020-06-20: qty 50

## 2020-06-20 MED ORDER — CHLORHEXIDINE GLUCONATE CLOTH 2 % EX PADS
6.0000 | MEDICATED_PAD | Freq: Every day | CUTANEOUS | Status: DC
  Filled 2020-06-20: qty 6

## 2020-06-20 MED ORDER — MIDAZOLAM HCL 2 MG/2ML IJ SOLN
2.0000 mg | INTRAMUSCULAR | Status: DC | PRN
  Administered 2020-06-20: 2 mg via INTRAVENOUS
  Filled 2020-06-20 (×2): qty 2

## 2020-06-20 MED ORDER — FENTANYL CITRATE (PF) 100 MCG/2ML IJ SOLN: 100.0000 ug | Freq: Once | INTRAMUSCULAR | Status: DC

## 2020-06-20 MED ORDER — DOCUSATE SODIUM 50 MG/5ML PO LIQD
100.0000 mg | Freq: Two times a day (BID) | ORAL | Status: DC
  Administered 2020-06-21: 100 mg via ORAL
  Filled 2020-06-20: qty 10

## 2020-06-20 MED ORDER — FENTANYL CITRATE (PF) 100 MCG/2ML IJ SOLN
INTRAMUSCULAR | Status: AC
  Administered 2020-06-20: 50 ug via INTRAVENOUS
  Filled 2020-06-20: qty 2

## 2020-06-20 MED ORDER — PROPOFOL 1000 MG/100ML IV EMUL
5.0000 ug/kg/min | INTRAVENOUS | Status: DC
  Administered 2020-06-20 (×2): 50 ug/kg/min via INTRAVENOUS
  Administered 2020-06-20: 70 ug/kg/min via INTRAVENOUS
  Administered 2020-06-21 (×5): 80 ug/kg/min via INTRAVENOUS
  Administered 2020-06-21: 60 ug/kg/min via INTRAVENOUS
  Administered 2020-06-21 (×3): 80 ug/kg/min via INTRAVENOUS
  Administered 2020-06-21: 70 ug/kg/min via INTRAVENOUS
  Administered 2020-06-21 – 2020-06-22 (×12): 80 ug/kg/min via INTRAVENOUS
  Filled 2020-06-20 (×2): qty 100
  Filled 2020-06-20: qty 200
  Filled 2020-06-20 (×6): qty 100
  Filled 2020-06-20: qty 200
  Filled 2020-06-20: qty 100
  Filled 2020-06-20: qty 200
  Filled 2020-06-20 (×13): qty 100

## 2020-06-20 MED ORDER — CHLORHEXIDINE GLUCONATE CLOTH 2 % EX PADS
6.0000 | MEDICATED_PAD | Freq: Every day | CUTANEOUS | Status: DC
  Administered 2020-06-20 – 2020-07-27 (×37): 6 via TOPICAL

## 2020-06-20 NOTE — Procedures (Signed)
Endotracheal Intubation: Patient required placement of an artificial airway secondary to Respiratory Failure  Consent: Emergent.   Hand washing performed prior to starting the procedure.   Medications administered for sedation prior to procedure:  Midazolam 4 mg IV,  Vecuronium 10 mg IV, Fentanyl 100 mcg IV.    A time out procedure was called and correct patient, name, & ID confirmed. Needed supplies and equipment were assembled and checked to include ETT, 10 ml syringe, Glidescope, Mac and Miller blades, suction, oxygen and bag mask valve, end tidal CO2 monitor.   Patient was positioned to align the mouth and pharynx to facilitate visualization of the glottis.   Heart rate, SpO2 and blood pressure was continuously monitored during the procedure. Pre-oxygenation was conducted prior to intubation and endotracheal tube was placed through the vocal cords into the trachea.     The artificial airway was placed under direct visualization via glidescope route using a 8.5 ETT on the first attempt.  ETT was secured at 23 cm mark.  Placement was confirmed by auscuitation of lungs with good breath sounds bilaterally and no stomach sounds.  Condensation was noted on endotracheal tube.   Pulse ox 98%.  CO2 detector in place with appropriate color change.   Complications: None .   Operator: Keelin Sheridan/Chuster   Chest radiograph ordered and pending.   Comments: OGT placed via glidescope.  Corrin Parker, M.D.  [facility]  Medical Director [place] Director [facility] Cardio-Pulmonary Department

## 2020-06-20 NOTE — Progress Notes (Signed)
PROGRESS NOTE  Nicholas Valdez:[id number] DOB: 1980/08/25   PCP: Oswaldo Conroy, MD  Patient is from: Home.  DOA: 06/19/2020 LOS: 1  Chief complaints: Alcohol intoxication and altered mental status  Brief Narrative / Interim history: 39 year old male with history of EtOH abuse and hep C on Epclusa?  presented alcohol detox facility and sent to ED due to altered mental status.  Reportedly, he drinks a pint of whiskey per day, and had 2 bottles of wine the day of admission.  In ED, hemodynamically stable.  97% on RA.  Afebrile.  Bicarb 21.  AST 54.  ALT 45.  Ammonia 76.  Alcohol level 15.  EKG normal sinus rhythm.  VBG consistent with respiratory alkalosis.  Lactic acid 3.3.  CT head without acute finding.  Patient was admitted for alcohol withdrawal.  Initially started on Ativan.   Patient had elevated CIWA score to 29 overnight.  Started on Precedex drip.  Subjective: Seen and examined earlier this morning.  Started on Precedex drip overnight due to markedly elevated CIWA scores.  He is somewhat somnolent this morning.  Briefly wakes to voice but not able to provide history.  Objective: Vitals:   06/20/20 0500 06/20/20 0600 06/20/20 0700 06/20/20 0800  BP: (!) 147/109  (!) 142/89 (!) 153/96  Pulse: 83 (!) 54 71 68  Resp: 19 15 18    Temp:      TempSrc:      SpO2: 99% 98% 99%   Weight:      Height:        Intake/Output Summary (Last 24 hours) at 06/20/2020 0954 Last data filed at 06/20/2020 0945 Gross per 24 hour  Intake 990.33 ml  Output --  Net 990.33 ml   Filed Weights   06/19/20 1954  Weight: (!) 145.2 kg    Examination:  GENERAL: Somewhat somnolent but briefly wakes to voice. HEENT: MMM.  Vision and hearing grossly intact.  NECK: Supple.  No apparent JVD.  RESP: On room air.  No IWOB.  Fair aeration bilaterally. CVS:  RRR. Heart sounds normal.  ABD/GI/GU: BS+. Abd soft, NTND.  MSK/EXT:  Moves extremities. No apparent deformity. No edema.  SKIN:  no apparent skin lesion or wound NEURO: Somnolent.  PERRL.  Patellar reflex symmetric.  No facial asymmetry.  No apparent focal neuro deficit but limited exam due to mental status. PSYCH: Calm.  No distress or agitation.  Procedures:  None  Microbiology summarized: COVID-19, influenza and RSV PCR nonreactive.  Assessment & Plan: Alcohol withdrawal-reportedly drinks 2 pints of whiskey daily.  Had 2 bottles of wine on the day of admission. -Started on Precedex drip overnight-titrate for RASS score of 0 to -1. -Continue IV vitamins -Continue CIWA  Hepatic and metabolic encephalopathy: Likely due to alcohol withdrawal.  Ammonia elevated to 76.  Down to 47 without lactulose.  He is currently somnolent on Precedex drip.  ABG with respiratory alkalosis.  CT head without acute finding. -Recheck.  If remains elevated, may have to try lactulose enema -Continue IV fluid. -Wean Precedex as above.  Elevated liver enzymes: Mild.  Likely due to alcohol.  RUQ 06/21/20 with mild hepatic steatosis. -Continue trending -Check CK  History of hepatitis C: Epclusa on his med list.  Not sure if he is taking or not. -Hold  pending med rec  Tobacco use disorder: -Offer nicotine patch  Metabolic acidosis: Likely due to alcohol and lactic acidosis. -Recheck lactic acid  Thrombocytopenia/macrocytosis: Likely from alcohol. -Continue monitoring -Check anemia panel  Body mass index is 45.93 kg/m.         DVT prophylaxis:  SCDs Start: 06/19/20 2301  Code Status: Full code Family Communication: Patient and/or RN. Available if any question. Status is: Inpatient  Remains inpatient appropriate because:Hemodynamically unstable, Altered mental status, IV treatments appropriate due to intensity of illness or inability to take PO and Inpatient level of care appropriate due to severity of illness   Dispo: The patient is from: Home              Anticipated d/c is to: Home              Anticipated d/c date  is: 2 days              Patient currently is not medically stable to d/c.       Consultants:  None   Sch Meds:  Scheduled Meds:  Chlorhexidine Gluconate Cloth  6 each Topical Daily   lactulose  20 g Oral BID   lactulose  30 g Oral Once   LORazepam  0-4 mg Intravenous Q6H   Or   LORazepam  0-4 mg Oral Q6H   [START ON 06/22/2020] LORazepam  0-4 mg Intravenous Q12H   Or   [START ON 06/22/2020] LORazepam  0-4 mg Oral Q12H   nicotine  21 mg Transdermal Daily   thiamine  100 mg Oral Daily   Or   thiamine  100 mg Intravenous Daily   Continuous Infusions:  sodium chloride 125 mL/hr at 06/20/20 0700   dexmedetomidine (PRECEDEX) IV infusion 0.9 mcg/kg/hr (06/20/20 0820)   PRN Meds:.LORazepam **OR** LORazepam, metoCLOPramide (REGLAN) injection  Antimicrobials: Anti-infectives (From admission, onward)   None       I have personally reviewed the following labs and images: CBC: Recent Labs  Lab 06/19/20 1953 06/20/20 0348  WBC 5.5 8.2  NEUTROABS  --  4.7  HGB 13.3 13.0  HCT 36.9* 37.4*  MCV 102.5* 105.1*  PLT 287 229   BMP &GFR Recent Labs  Lab 06/19/20 1953 06/19/20 2209 06/20/20 0348  NA 138  --  141  K 3.5  --  3.8  CL 106  --  112*  CO2 21*  --  18*  GLUCOSE 137*  --  95  BUN 7  --  7  CREATININE 0.80  --  0.88  CALCIUM 7.8*  --  7.3*  MG  --  1.9 1.9  PHOS  --  4.7* 2.9   Estimated Creatinine Clearance: 167.3 mL/min (by C-G formula based on SCr of 0.88 mg/dL). Liver & Pancreas: Recent Labs  Lab 06/19/20 1953 06/20/20 0348  AST 54* 62*  ALT 45* 46*  ALKPHOS 47 44  BILITOT 0.8 1.0  PROT 7.1 6.7  ALBUMIN 3.6 3.4*   No results for input(s): LIPASE, AMYLASE in the last 168 hours. Recent Labs  Lab 06/19/20 2043 06/20/20 0348  AMMONIA 76* 47*   Diabetic: No results for input(s): HGBA1C in the last 72 hours. Recent Labs  Lab 06/20/20 0520  GLUCAP 101*   Cardiac Enzymes: Recent Labs  Lab 06/19/20 2209  CKTOTAL 138   No  results for input(s): PROBNP in the last 8760 hours. Coagulation Profile: Recent Labs  Lab 06/19/20 2209  INR 1.0   Thyroid Function Tests: Recent Labs    06/20/20 0348  TSH 8.794*   Lipid Profile: No results for input(s): CHOL, HDL, LDLCALC, TRIG, CHOLHDL, LDLDIRECT in the last 72 hours. Anemia Panel: No results for input(s): VITAMINB12,  FOLATE, FERRITIN, TIBC, IRON, RETICCTPCT in the last 72 hours. Urine analysis:    Component Value Date/Time   COLORURINE AMBER (A) 01/10/2020 0122   APPEARANCEUR HAZY (A) 01/10/2020 0122   APPEARANCEUR Hazy 02/12/2013 2028   LABSPEC 1.030 01/10/2020 0122   LABSPEC 1.019 02/12/2013 2028   PHURINE 6.0 01/10/2020 0122   GLUCOSEU NEGATIVE 01/10/2020 0122   GLUCOSEU Negative 02/12/2013 2028   HGBUR NEGATIVE 01/10/2020 0122   BILIRUBINUR NEGATIVE 01/10/2020 0122   BILIRUBINUR Negative 02/12/2013 2028   KETONESUR NEGATIVE 01/10/2020 0122   PROTEINUR 100 (A) 01/10/2020 0122   NITRITE NEGATIVE 01/10/2020 0122   LEUKOCYTESUR NEGATIVE 01/10/2020 0122   LEUKOCYTESUR Negative 02/12/2013 2028   Sepsis Labs: Invalid input(s): PROCALCITONIN, LACTICIDVEN  Microbiology: Recent Results (from the past 240 hour(s))  Respiratory Panel by RT PCR (Flu A&B, Covid) - Nasopharyngeal Swab     Status: None   Collection Time: 06/19/20  9:32 PM   Specimen: Nasopharyngeal Swab  Result Value Ref Range Status   SARS Coronavirus 2 by RT PCR NEGATIVE NEGATIVE Final    Comment: (NOTE) SARS-CoV-2 target nucleic acids are NOT DETECTED.  The SARS-CoV-2 RNA is generally detectable in upper respiratoy specimens during the acute phase of infection. The lowest concentration of SARS-CoV-2 viral copies this assay can detect is 131 copies/mL. A negative result does not preclude SARS-Cov-2 infection and should not be used as the sole basis for treatment or other patient management decisions. A negative result may occur with  improper specimen collection/handling, submission  of specimen other than nasopharyngeal swab, presence of viral mutation(s) within the areas targeted by this assay, and inadequate number of viral copies (<131 copies/mL). A negative result must be combined with clinical observations, patient history, and epidemiological information. The expected result is Negative.  Fact Sheet for Patients:  https://www.moore.com/  Fact Sheet for Healthcare Providers:  https://www.young.biz/  This test is no t yet approved or cleared by the [place] FDA and  has been authorized for detection and/or diagnosis of SARS-CoV-2 by FDA under an Emergency Use Authorization (EUA). This EUA will remain  in effect (meaning this test can be used) for the duration of the COVID-19 declaration under Section 564(b)(1) of the Act, 21 U.S.C. section 360bbb-3(b)(1), unless the authorization is terminated or revoked sooner.     Influenza A by PCR NEGATIVE NEGATIVE Final   Influenza B by PCR NEGATIVE NEGATIVE Final    Comment: (NOTE) The Xpert Xpress SARS-CoV-2/FLU/RSV assay is intended as an aid in  the diagnosis of influenza from Nasopharyngeal swab specimens and  should not be used as a sole basis for treatment. Nasal washings and  aspirates are unacceptable for Xpert Xpress SARS-CoV-2/FLU/RSV  testing.  Fact Sheet for Patients: https://www.moore.com/  Fact Sheet for Healthcare Providers: https://www.young.biz/  This test is not yet approved or cleared by the [place] FDA and  has been authorized for detection and/or diagnosis of SARS-CoV-2 by  FDA under an Emergency Use Authorization (EUA). This EUA will remain  in effect (meaning this test can be used) for the duration of the  Covid-19 declaration under Section 564(b)(1) of the Act, 21  U.S.C. section 360bbb-3(b)(1), unless the authorization is  terminated or revoked. Performed at [facility], [address] [place], [place] [address]     Radiology Studies: CT Head Wo Contrast  Result Date: 06/19/2020 CLINICAL DATA:  Mental status change EXAM: CT HEAD WITHOUT CONTRAST TECHNIQUE: Contiguous axial images were obtained from the base of the skull through  the vertex without intravenous contrast. COMPARISON:  January 20, 2007 FINDINGS: Brain: No evidence of acute territorial infarction, hemorrhage, hydrocephalus,extra-axial collection or mass lesion/mass effect. There is a hypodense area seen within the right basal ganglia which could represent an enlarging perivascular space seen on the prior exam dating back to 2008. Normal gray-white differentiation. Ventricles are normal in size and contour. Vascular: No hyperdense vessel or unexpected calcification. Skull: The skull is intact. No fracture or focal lesion identified. Sinuses/Orbits: The visualized paranasal sinuses and mastoid air cells are clear. The orbits and globes intact. Other: None IMPRESSION: No acute intracranial abnormality. Electronically Signed   By: Jonna Clark M.D.   On: 06/19/2020 21:21   DG Chest Port 1 View  Result Date: 06/19/2020 CLINICAL DATA:  Fatigue, altered mental status EXAM: PORTABLE CHEST 1 VIEW COMPARISON:  Radiograph 02/14/2020, CT 07/26/2019 FINDINGS: Streaky lower lung opacities, particularly right medial lung base. No pneumothorax or visible effusion. No convincing features of edema. The cardiomediastinal contours are unremarkable. No acute osseous or soft tissue abnormality. Telemetry leads overlie the chest. IMPRESSION: Streaky lower lung opacities, particularly right medial lung base. Strongly favor atelectasis though infection could present similarly in the appropriate clinical setting. Electronically Signed   By: Kreg Shropshire M.D.   On: 06/19/2020 22:22   US Abdomen Limited RUQ (LIVER/GB)  Result Date: 06/20/2020 CLINICAL DATA:  Hepatitis-C, alcohol use, elevated liver function tests EXAM: ULTRASOUND ABDOMEN LIMITED  RIGHT UPPER QUADRANT COMPARISON:  None. FINDINGS: Gallbladder: No gallstones or wall thickening visualized. No sonographic Murphy sign noted by sonographer. Common bile duct: Diameter: 3-4 mm proximally Liver: The liver parenchyma is diffusely mildly echogenic suggesting changes of mild hepatic steatosis. No focal intrahepatic mass identified. No intrahepatic biliary ductal dilation. Portal vein is patent on color Doppler imaging with normal direction of blood flow towards the liver. Other: No ascites IMPRESSION: Mild hepatic steatosis Electronically Signed   By: Helyn Numbers MD   On: 06/20/2020 01:59      Tynesia Harral T. Blayke Cordrey [facility]  If 7PM-7AM, please contact night-coverage [email or web address] 06/20/2020, 9:54 AM

## 2020-06-20 NOTE — ED Notes (Signed)
Pt transported to the bathroom via 1 person assist. Pt did well with this RN at bedside. Pt redirectable to voice. Pt had diarrhea. Pt wobbly on feet but able to ambulate with 1 person assist.

## 2020-06-20 NOTE — ED Notes (Signed)
Attempted to call report x 1  

## 2020-06-20 NOTE — Progress Notes (Signed)
1500 Patient trying to exit bed. Forced back into bed. Speech slurred . Unable to convince patient to calm down. Precedex increased to 1.2 mcg/hr. Also given prn Ativan for agitation.

## 2020-06-20 NOTE — Consult Note (Addendum)
Name: Nicholas Valdez MRN: [id number] DOB: 12-13-1980     CONSULTATION DATE: 06/19/2020  REFERRING MD :  Alanda Slim  CHIEF COMPLAINT:resp failure, Alcohol intoxication and altered mental status  39 year old male with history of EtOH abuse and hep C  presented alcohol detox facility and sent to ED due to altered mental status.   Reportedly, he drinks a pint of whiskey per day, and had 2 bottles of wine the day of admission  In ED, hemodynamically stable.  97% on RA.  Afebrile.  Bicarb 21.  AST 54.  ALT 45.  Ammonia 76.  Alcohol level 15.   EKG normal sinus rhythm.  VBG consistent with respiratory alkalosis.  Lactic acid 3.3.   CT head without acute finding.  Patient was admitted for alcohol withdrawal.  Initially started on Ativan.   Patient had elevated CIWA score to 29 overnight.  Started on Precedex drip.  Patient with increased WOB, patient was emergently intubated and sedated    PAST MEDICAL HISTORY :   has a past medical history of Alcohol abuse, Drug abuse (HCC), and Hepatitis C.  has no past surgical history on file. Prior to Admission medications   Medication Sig Start Date End Date Taking? Authorizing Provider  Sofosbuvir-Velpatasvir 400-100 MG TABS Take 1 tablet by mouth daily. 06/06/20  Yes [provider]  acetaminophen (TYLENOL) 325 MG tablet Take 650 mg by mouth every 6 (six) hours as needed.    [provider]  chlordiazePOXIDE (LIBRIUM) 25 MG capsule Day 1 - 50 mg every 6 hours Day 2 - 25 mg every 6 hours Day 3 - 25 mg twice a day Day 4- 25 mg at night 05/28/20   Jene Every, MD  escitalopram (LEXAPRO) 5 MG tablet Take 5 mg by mouth daily.    [provider]  folic acid (FOLVITE) 1 MG tablet Take by mouth. 11/25/19 11/24/20  [provider]  gabapentin (NEURONTIN) 300 MG capsule Take 300 mg by mouth every 8 (eight) hours. 05/29/20   [provider]  hydrOXYzine (ATARAX/VISTARIL) 25 MG tablet Take by mouth. 11/24/19    [provider]  melatonin 3 MG TABS tablet Take 3 mg by mouth at bedtime.  11/24/19   [provider]  Multiple Vitamins-Minerals (THERA-M) TABS Take 1 tablet by mouth daily. 11/25/19   [provider]  naltrexone (DEPADE) 50 MG tablet Take by mouth. 11/24/19 11/23/20  [provider]  nicotine (NICODERM CQ - DOSED IN MG/24 HOURS) 21 mg/24hr patch Place 2 patches on the skin daily. 11/24/19   [provider]  thiamine 100 MG tablet Take by mouth. 11/25/19 11/24/20  [provider]   Allergies  Allergen Reactions  . Penicillins     Did it involve swelling of the face/tongue/throat, SOB, or low BP? Yes Did it involve sudden or severe rash/hives, skin peeling, or any reaction on the inside of your mouth or nose? Yes Did you need to seek medical attention at a hospital or doctor's office? Yes When did it last happen?childhood If all above answers are "NO", may proceed with cephalosporin use.  Marland Kitchen Zofran [Ondansetron Hcl] Hives    FAMILY HISTORY:  family history includes Hypertension in an other family member. SOCIAL HISTORY:  reports that he has been smoking. He has never used smokeless tobacco. He reports current alcohol use. He reports current drug use. Drug: Marijuana.  REVIEW OF SYSTEMS:   Unable to obtain due to critical illness      Estimated body  mass index is 45.93 kg/m as calculated from the following:   Height as of this encounter: 5\' 10"  (1.778 m).   Weight as of this encounter: 145.2 kg.    VITAL SIGNS: Temp:  [97.6 F (36.4 C)-98 F (36.7 C)] 97.6 F (36.4 C) (11/16 0800) Pulse Rate:  [54-93] 77 (11/16 1800) Resp:  [15-34] 15 (11/16 1800) BP: (95-178)/(71-129) 109/71 (11/16 1800) SpO2:  [95 %-100 %] 97 % (11/16 1800) FiO2 (%):  [30 %] 30 % (11/16 1603) Weight:  [145.2 kg] 145.2 kg (11/15 1954)   I/O last 3 completed shifts: In: 990.3 [I.V.:990.3] Out: -  Total I/O In: 471.8 [I.V.:471.8] Out: -     SpO2: 97 % FiO2 (%): 30 %   Physical Examination:  GENERAL:critically ill appearing, +resp distress HEAD: Normocephalic, atraumatic.  EYES: Pupils equal, round, reactive to light.  No scleral icterus.  MOUTH: Moist mucosal membrane. NECK: Supple. No JVD.  PULMONARY: +rhonchi, +wheezing CARDIOVASCULAR: S1 and S2. Regular rate and rhythm. No murmurs, rubs, or gallops.  GASTROINTESTINAL: Soft, nontender, -distended.  Positive bowel sounds.  MUSCULOSKELETAL: No swelling, clubbing, or edema.  NEUROLOGIC: obtunded SKIN:intact,warm,dry   MEDICATIONS: I have reviewed all medications and confirmed regimen as documented   CULTURE RESULTS   Recent Results (from the past 240 hour(s))  Respiratory Panel by RT PCR (Flu A&B, Covid) - Nasopharyngeal Swab     Status: None   Collection Time: 06/19/20  9:32 PM   Specimen: Nasopharyngeal Swab  Result Value Ref Range Status   SARS Coronavirus 2 by RT PCR NEGATIVE NEGATIVE Final    Comment: (NOTE) SARS-CoV-2 target nucleic acids are NOT DETECTED.  The SARS-CoV-2 RNA is generally detectable in upper respiratoy specimens during the acute phase of infection. The lowest concentration of SARS-CoV-2 viral copies this assay can detect is 131 copies/mL. A negative result does not preclude SARS-Cov-2 infection and should not be used as the sole basis for treatment or other patient management decisions. A negative result may occur with  improper specimen collection/handling, submission of specimen other than nasopharyngeal swab, presence of viral mutation(s) within the areas targeted by this assay, and inadequate number of viral copies (<131 copies/mL). A negative result must be combined with clinical observations, patient history, and epidemiological information. The expected result is Negative.  Fact Sheet for Patients:  06/21/20  Fact Sheet for Healthcare Providers:   https://www.moore.com/  This test is no t yet approved or cleared by the https://www.young.biz/ FDA and  has been authorized for detection and/or diagnosis of SARS-CoV-2 by FDA under an Emergency Use Authorization (EUA). This EUA will remain  in effect (meaning this test can be used) for the duration of the COVID-19 declaration under Section 564(b)(1) of the Act, 21 U.S.C. section 360bbb-3(b)(1), unless the authorization is terminated or revoked sooner.     Influenza A by PCR NEGATIVE NEGATIVE Final   Influenza B by PCR NEGATIVE NEGATIVE Final    Comment: (NOTE) The Xpert Xpress SARS-CoV-2/FLU/RSV assay is intended as an aid in  the diagnosis of influenza from Nasopharyngeal swab specimens and  should not be used as a sole basis for treatment. Nasal washings and  aspirates are unacceptable for Xpert Xpress SARS-CoV-2/FLU/RSV  testing.  Fact Sheet for Patients: [place]  Fact Sheet for Healthcare Providers: https://www.moore.com/  This test is not yet approved or cleared by the https://www.young.biz/ FDA and  has been authorized for detection and/or diagnosis of SARS-CoV-2 by  FDA under an Emergency Use Authorization (EUA). This EUA will  remain  in effect (meaning this test can be used) for the duration of the  Covid-19 declaration under Section 564(b)(1) of the Act, 21  U.S.C. section 360bbb-3(b)(1), unless the authorization is  terminated or revoked. Performed at [facility], [address] [place], [place] [address]   MRSA PCR Screening     Status: None   Collection Time: 06/20/20  9:35 AM   Specimen: Nasopharyngeal  Result Value Ref Range Status   MRSA by PCR NEGATIVE NEGATIVE Final    Comment:        The GeneXpert MRSA Assay (FDA approved for NASAL specimens only), is one component of a comprehensive MRSA colonization surveillance program. It is not intended to diagnose MRSA infection nor to guide  or monitor treatment for MRSA infections. Performed at [facility], [address] [place], [place] [address]           IMAGING    CT Head Wo Contrast  Result Date: 06/19/2020 CLINICAL DATA:  Mental status change EXAM: CT HEAD WITHOUT CONTRAST TECHNIQUE: Contiguous axial images were obtained from the base of the skull through the vertex without intravenous contrast. COMPARISON:  January 20, 2007 FINDINGS: Brain: No evidence of acute territorial infarction, hemorrhage, hydrocephalus,extra-axial collection or mass lesion/mass effect. There is a hypodense area seen within the right basal ganglia which could represent an enlarging perivascular space seen on the prior exam dating back to 2008. Normal gray-white differentiation. Ventricles are normal in size and contour. Vascular: No hyperdense vessel or unexpected calcification. Skull: The skull is intact. No fracture or focal lesion identified. Sinuses/Orbits: The visualized paranasal sinuses and mastoid air cells are clear. The orbits and globes intact. Other: None IMPRESSION: No acute intracranial abnormality. Electronically Signed   By: Jonna Clark M.D.   On: 06/19/2020 21:21   DG Chest Port 1 View  Result Date: 06/19/2020 CLINICAL DATA:  Fatigue, altered mental status EXAM: PORTABLE CHEST 1 VIEW COMPARISON:  Radiograph 02/14/2020, CT 07/26/2019 FINDINGS: Streaky lower lung opacities, particularly right medial lung base. No pneumothorax or visible effusion. No convincing features of edema. The cardiomediastinal contours are unremarkable. No acute osseous or soft tissue abnormality. Telemetry leads overlie the chest. IMPRESSION: Streaky lower lung opacities, particularly right medial lung base. Strongly favor atelectasis though infection could present similarly in the appropriate clinical setting. Electronically Signed   By: Kreg Shropshire M.D.   On: 06/19/2020 22:22   US Abdomen Limited RUQ (LIVER/GB)  Result Date:  06/20/2020 CLINICAL DATA:  Hepatitis-C, alcohol use, elevated liver function tests EXAM: ULTRASOUND ABDOMEN LIMITED RIGHT UPPER QUADRANT COMPARISON:  None. FINDINGS: Gallbladder: No gallstones or wall thickening visualized. No sonographic Murphy sign noted by sonographer. Common bile duct: Diameter: 3-4 mm proximally Liver: The liver parenchyma is diffusely mildly echogenic suggesting changes of mild hepatic steatosis. No focal intrahepatic mass identified. No intrahepatic biliary ductal dilation. Portal vein is patent on color Doppler imaging with normal direction of blood flow towards the liver. Other: No ascites IMPRESSION: Mild hepatic steatosis Electronically Signed   By: Helyn Numbers MD   On: 06/20/2020 01:59    Indwelling Urinary Catheter continued, requirement due to   Reason to continue Indwelling Urinary Catheter strict Intake/Output monitoring for hemodynamic instability   Central Line/ continued, requirement due to  Reason to continue [employer] Monitoring of central venous pressure or other hemodynamic parameters and poor IV access   Ventilator continued, requirement due to severe respiratory failure   Ventilator Sedation RASS 0 to -2  ASSESSMENT AND PLAN SYNOPSIS  39 yo male with progressive respiratory failure due to progressive ETOH abuse and severe delirium and liver failure  hepatic encephalopathy, Patient was emergently intubated and sedated  Severe ACUTE Hypoxic and Hypercapnic Respiratory Failure -continue Full MV support -continue Bronchodilator Therapy -Wean Fio2 and PEEP as tolerated -VAP/VENT bundle implementation  Morbid obesity, possible OSA.   Will certainly impact respiratory mechanics, ventilator weaning Suspect will need to consider additional PEEP, possible extubation to BiPAP when appropriate to consider   ACUTE KIDNEY INJURY/Renal Failure -continue Foley Catheter-assess need -Avoid nephrotoxic agents -Follow urine output, BMP -Ensure  adequate renal perfusion, optimize oxygenation -Renal dose medications     NEUROLOGY Acute toxic metabolic encephalopathy, need for sedation Goal RASS -2 to -3  CARDIAC ICU monitoring  ID -continue IV abx as prescibed -follow up cultures  GI GI PROPHYLAXIS as indicated  NUTRITIONAL STATUS DIET-->TF's as tolerated Constipation protocol as indicated   ENDO - will use ICU hypoglycemic\Hyperglycemia protocol if needed    ELECTROLYTES -follow labs as needed -replace as needed -pharmacy consultation and following    DVT/GI PRX ordered and assessed TRANSFUSIONS AS NEEDED MONITOR FSBS I Assessed the need for Labs I Assessed the need for Foley I Assessed the need for Central Venous Line Family Discussion when available I Assessed the need for Mobilization I made an Assessment of medications to be adjusted accordingly Safety Risk assessment Completed  CASE DISCUSSED IN MULTIDISCIPLINARY ROUNDS WITH ICU TEAM   Critical Care Time devoted to patient care services described in this note is 65 minutes.   Overall, patient is critically ill, prognosis is guarded.  Patient with Multiorgan failure and at high risk for cardiac arrest and death.   I anticipate prolonged ICU LOS   Lucie Leather, M.D.  [facility]  Medical Director [facility] Medical Director [facility] Cardio-Pulmonary Department

## 2020-06-20 NOTE — ED Notes (Signed)
Pt transported to bedside toilet at this time with this RN assisting.

## 2020-06-20 NOTE — Procedures (Signed)
Central Venous Catheter Insertion Procedure Note  Nicholas Valdez  [id number]  1980/08/17  Date:06/20/20  Time:5:45 PM   Provider Performing:Denver Bentson L Rust-Chester   Procedure: Insertion of Non-tunneled Central Venous Catheter(36556) with US guidance (40981)   Indication(s) Medication administration and Difficult access  Consent Unable to obtain consent due to emergent nature of procedure.  Anesthesia Topical only with 1% lidocaine   Timeout Verified patient identification, verified procedure, site/side was marked, verified correct patient position, special equipment/implants available, medications/allergies/relevant history reviewed, required imaging and test results available.  Sterile Technique Maximal sterile technique including full sterile barrier drape, hand hygiene, sterile gown, sterile gloves, mask, hair covering, sterile ultrasound probe cover (if used).  Procedure Description Area of catheter insertion was cleaned with chlorhexidine and draped in sterile fashion.  With real-time ultrasound guidance a central venous catheter was placed into the right internal jugular vein. Nonpulsatile blood flow and easy flushing noted in all ports.  The catheter was sutured in place and sterile dressing applied.  Complications/Tolerance None; patient tolerated the procedure well. Chest X-ray is ordered to verify placement for internal jugular or subclavian cannulation.   Chest x-ray is not ordered for femoral cannulation.  EBL Minimal  Specimen(s) None  Betsey Holiday, AGACNP-BC Acute Care Nurse Practitioner [facility]   [phone] / [phone] Please see Amion for pager details.

## 2020-06-21 ENCOUNTER — Inpatient Hospital Stay: Payer: Medicaid Other

## 2020-06-21 ENCOUNTER — Inpatient Hospital Stay (HOSPITAL_COMMUNITY)
Admit: 2020-06-21 | Discharge: 2020-06-21 | Disposition: A | Discharge status: Home / Self Care | Payer: Medicaid Other | Attending: Pulmonary Disease | Admitting: Pulmonary Disease

## 2020-06-21 DIAGNOSIS — F10231 Alcohol dependence with withdrawal delirium: Secondary | ICD-10-CM | POA: Diagnosis not present

## 2020-06-21 DIAGNOSIS — G9341 Metabolic encephalopathy: Secondary | ICD-10-CM | POA: Diagnosis not present

## 2020-06-21 DIAGNOSIS — I428 Other cardiomyopathies: Secondary | ICD-10-CM | POA: Diagnosis not present

## 2020-06-21 DIAGNOSIS — R7989 Other specified abnormal findings of blood chemistry: Secondary | ICD-10-CM | POA: Diagnosis not present

## 2020-06-21 DIAGNOSIS — I429 Cardiomyopathy, unspecified: Secondary | ICD-10-CM

## 2020-06-21 LAB — BLOOD GAS, ARTERIAL
Acid-base deficit: 8.5 mmol/L — ABNORMAL HIGH (ref 0.0–2.0)
Bicarbonate: 17.3 mmol/L — ABNORMAL LOW (ref 20.0–28.0)
FIO2: 0.5
O2 Saturation: 98.8 %
PEEP: 5 cmH2O
Patient temperature: 38.2
RATE: 16 resp/min
pCO2 arterial: 38 mmHg (ref 32.0–48.0)
pH, Arterial: 7.27 — ABNORMAL LOW (ref 7.350–7.450)
pO2, Arterial: 144 mmHg — ABNORMAL HIGH (ref 83.0–108.0)

## 2020-06-21 LAB — GLUCOSE, CAPILLARY
Glucose-Capillary: 128 mg/dL — ABNORMAL HIGH (ref 70–99)
Glucose-Capillary: 139 mg/dL — ABNORMAL HIGH (ref 70–99)
Glucose-Capillary: 121 mg/dL — ABNORMAL HIGH (ref 70–99)
Glucose-Capillary: 120 mg/dL — ABNORMAL HIGH (ref 70–99)
Glucose-Capillary: 105 mg/dL — ABNORMAL HIGH (ref 70–99)

## 2020-06-21 LAB — CBC WITH DIFFERENTIAL/PLATELET
Abs Immature Granulocytes: 0.1 10*3/uL — ABNORMAL HIGH (ref 0.00–0.07)
Basophils Absolute: 0.1 10*3/uL (ref 0.0–0.1)
Basophils Relative: 0 %
Eosinophils Absolute: 0.4 10*3/uL (ref 0.0–0.5)
Eosinophils Relative: 2 %
HCT: 39 % (ref 39.0–52.0)
Hemoglobin: 13.4 g/dL (ref 13.0–17.0)
Immature Granulocytes: 1 %
Lymphocytes Relative: 16 %
Lymphs Abs: 2.7 10*3/uL (ref 0.7–4.0)
MCH: 37.3 pg — ABNORMAL HIGH (ref 26.0–34.0)
MCHC: 34.4 g/dL (ref 30.0–36.0)
MCV: 108.6 fL — ABNORMAL HIGH (ref 80.0–100.0)
Monocytes Absolute: 1.1 10*3/uL — ABNORMAL HIGH (ref 0.1–1.0)
Monocytes Relative: 6 %
Neutro Abs: 13.1 10*3/uL — ABNORMAL HIGH (ref 1.7–7.7)
Neutrophils Relative %: 75 %
Platelets: 316 10*3/uL (ref 150–400)
RBC: 3.59 MIL/uL — ABNORMAL LOW (ref 4.22–5.81)
RDW: 15.4 % (ref 11.5–15.5)
WBC: 17.5 10*3/uL — ABNORMAL HIGH (ref 4.0–10.5)
nRBC: 0.1 % (ref 0.0–0.2)

## 2020-06-21 LAB — ECHOCARDIOGRAM COMPLETE
AR max vel: 3.17 cm2
AV Area VTI: 2.85 cm2
AV Area mean vel: 2.69 cm2
AV Mean grad: 7 mmHg
AV Peak grad: 13.1 mmHg
Ao pk vel: 1.81 m/s
Area-P 1/2: 4.04 cm2
Height: 70 in
S' Lateral: 2.8 cm
Weight: 5121.73 oz

## 2020-06-21 LAB — PROCALCITONIN: Procalcitonin: 0.12 ng/mL

## 2020-06-21 LAB — BASIC METABOLIC PANEL
Anion gap: 10 (ref 5–15)
BUN: 12 mg/dL (ref 6–20)
CO2: 20 mmol/L — ABNORMAL LOW (ref 22–32)
Calcium: 7.2 mg/dL — ABNORMAL LOW (ref 8.9–10.3)
Chloride: 106 mmol/L (ref 98–111)
Creatinine, Ser: 1.54 mg/dL — ABNORMAL HIGH (ref 0.61–1.24)
GFR, Estimated: 60 mL/min — ABNORMAL LOW (ref >60)
Glucose, Bld: 133 mg/dL — ABNORMAL HIGH (ref 70–99)
Potassium: 4.4 mmol/L (ref 3.5–5.1)
Sodium: 136 mmol/L (ref 135–145)

## 2020-06-21 LAB — MAGNESIUM: Magnesium: 2 mg/dL (ref 1.7–2.4)

## 2020-06-21 LAB — HIV ANTIBODY (ROUTINE TESTING W REFLEX): HIV Screen 4th Generation wRfx: NONREACTIVE

## 2020-06-21 LAB — CK: Total CK: 107 U/L (ref 49–397)

## 2020-06-21 LAB — PHOSPHORUS: Phosphorus: 4.6 mg/dL (ref 2.5–4.6)

## 2020-06-21 LAB — LACTIC ACID, PLASMA: Lactic Acid, Venous: 1.1 mmol/L (ref 0.5–1.9)

## 2020-06-21 MED ORDER — FOLIC ACID 1 MG PO TABS
1.0000 mg | ORAL_TABLET | Freq: Every day | ORAL | Status: DC
  Administered 2020-06-21: 1 mg via ORAL
  Filled 2020-06-21: qty 1

## 2020-06-21 MED ORDER — ENOXAPARIN SODIUM 40 MG/0.4ML ~~LOC~~ SOLN: 40.0000 mg | SUBCUTANEOUS | Status: DC

## 2020-06-21 MED ORDER — VECURONIUM BROMIDE 10 MG IV SOLR: 20.0000 mg | Freq: Once | INTRAVENOUS | Status: DC

## 2020-06-21 MED ORDER — ACETAMINOPHEN 325 MG PO TABS
650.0000 mg | ORAL_TABLET | Freq: Four times a day (QID) | ORAL | Status: DC | PRN
  Administered 2020-06-21 – 2020-07-28 (×16): 650 mg
  Filled 2020-06-21 (×16): qty 2

## 2020-06-21 MED ORDER — ACETAMINOPHEN 650 MG RE SUPP: 650.0000 mg | Freq: Four times a day (QID) | RECTAL | Status: DC | PRN

## 2020-06-21 MED ORDER — VITAL HIGH PROTEIN PO LIQD: 1000.0000 mL | ORAL | Status: DC

## 2020-06-21 MED ORDER — LORAZEPAM 2 MG/ML IJ SOLN
INTRAMUSCULAR | Status: AC
  Filled 2020-06-21: qty 2

## 2020-06-21 MED ORDER — CHLORDIAZEPOXIDE HCL 25 MG PO CAPS
25.0000 mg | ORAL_CAPSULE | Freq: Four times a day (QID) | ORAL | Status: DC
  Administered 2020-06-21 – 2020-06-30 (×36): 25 mg
  Filled 2020-06-21 (×35): qty 1

## 2020-06-21 MED ORDER — CHLORDIAZEPOXIDE HCL 25 MG PO CAPS: 25.0000 mg | ORAL_CAPSULE | Freq: Four times a day (QID) | ORAL | Status: DC | PRN

## 2020-06-21 MED ORDER — FENTANYL CITRATE (PF) 100 MCG/2ML IJ SOLN: 200.0000 ug | Freq: Once | INTRAMUSCULAR | Status: DC

## 2020-06-21 MED ORDER — SODIUM CHLORIDE 0.9 % IV SOLN
0.5000 mg/h | INTRAVENOUS | Status: AC
  Administered 2020-06-21 (×2): 1 mg/h via INTRAVENOUS
  Administered 2020-06-22 – 2020-06-24 (×4): 4 mg/h via INTRAVENOUS
  Administered 2020-06-25 – 2020-06-26 (×2): 3 mg/h via INTRAVENOUS
  Administered 2020-06-26: 4 mg/h via INTRAVENOUS
  Administered 2020-06-27: 3 mg/h via INTRAVENOUS
  Administered 2020-06-27: 4 mg/h via INTRAVENOUS
  Administered 2020-06-28 – 2020-06-29 (×3): 3 mg/h via INTRAVENOUS
  Administered 2020-06-30 – 2020-07-02 (×5): 4 mg/h via INTRAVENOUS
  Administered 2020-07-03: 2 mg/h via INTRAVENOUS
  Administered 2020-07-04 – 2020-07-08 (×9): 4 mg/h via INTRAVENOUS
  Administered 2020-07-08: 5 mg/h via INTRAVENOUS
  Administered 2020-07-09 – 2020-07-16 (×13): 4 mg/h via INTRAVENOUS
  Administered 2020-07-17 – 2020-07-18 (×2): 2 mg/h via INTRAVENOUS
  Administered 2020-07-18 – 2020-07-21 (×5): 4 mg/h via INTRAVENOUS
  Filled 2020-06-21 (×51): qty 5

## 2020-06-21 MED ORDER — FENTANYL CITRATE (PF) 100 MCG/2ML IJ SOLN
INTRAMUSCULAR | Status: AC
  Filled 2020-06-21: qty 4

## 2020-06-21 MED ORDER — PANTOPRAZOLE SODIUM 40 MG IV SOLR
40.0000 mg | INTRAVENOUS | Status: DC
  Administered 2020-06-21: 40 mg via INTRAVENOUS
  Filled 2020-06-21: qty 40

## 2020-06-21 MED ORDER — HYDROMORPHONE HCL 1 MG/ML IJ SOLN
1.0000 mg | Freq: Once | INTRAMUSCULAR | Status: DC
  Filled 2020-06-21: qty 1

## 2020-06-21 MED ORDER — VITAL AF 1.2 CAL PO LIQD
1000.0000 mL | ORAL | Status: DC
  Administered 2020-06-21 – 2020-06-23 (×3): 1000 mL

## 2020-06-21 MED ORDER — HYDROMORPHONE BOLUS VIA INFUSION
0.5000 mg | INTRAVENOUS | Status: DC | PRN
  Administered 2020-06-26 – 2020-07-20 (×22): 0.5 mg via INTRAVENOUS
  Filled 2020-06-21 (×5): qty 1

## 2020-06-21 MED ORDER — LORAZEPAM 2 MG/ML IJ SOLN: 4.0000 mg | Freq: Once | INTRAMUSCULAR | Status: DC

## 2020-06-21 MED ORDER — VECURONIUM BROMIDE 10 MG IV SOLR
10.0000 mg | INTRAVENOUS | Status: DC | PRN
  Administered 2020-06-21 (×2): 10 mg via INTRAVENOUS
  Filled 2020-06-21: qty 10

## 2020-06-21 MED ORDER — ADULT MULTIVITAMIN W/MINERALS CH
1.0000 | ORAL_TABLET | Freq: Every day | ORAL | Status: DC
  Administered 2020-06-22 – 2020-07-30 (×39): 1
  Filled 2020-06-21 (×38): qty 1

## 2020-06-21 MED ORDER — MIDAZOLAM HCL 2 MG/2ML IJ SOLN
2.0000 mg | INTRAMUSCULAR | Status: DC | PRN
  Administered 2020-06-24: 4 mg via INTRAVENOUS
  Administered 2020-06-30 – 2020-07-06 (×5): 2 mg via INTRAVENOUS
  Administered 2020-07-07 – 2020-07-08 (×4): 4 mg via INTRAVENOUS
  Administered 2020-07-08: 2 mg via INTRAVENOUS
  Administered 2020-07-08: 4 mg via INTRAVENOUS
  Administered 2020-07-08: 2 mg via INTRAVENOUS
  Administered 2020-07-09 – 2020-07-10 (×9): 4 mg via INTRAVENOUS
  Administered 2020-07-11: 2 mg via INTRAVENOUS
  Administered 2020-07-13: 4 mg via INTRAVENOUS
  Filled 2020-06-21: qty 2
  Filled 2020-06-21 (×5): qty 4
  Filled 2020-06-21: qty 2
  Filled 2020-06-21 (×2): qty 4
  Filled 2020-06-21 (×2): qty 2
  Filled 2020-06-21: qty 4

## 2020-06-21 MED ORDER — PANTOPRAZOLE SODIUM 40 MG PO PACK
40.0000 mg | PACK | Freq: Every day | ORAL | Status: DC
  Administered 2020-06-22 – 2020-07-28 (×37): 40 mg
  Filled 2020-06-21 (×39): qty 20

## 2020-06-21 MED ORDER — PROSOURCE TF PO LIQD
90.0000 mL | Freq: Every day | ORAL | Status: DC
  Administered 2020-06-21 – 2020-06-23 (×13): 90 mL
  Filled 2020-06-21 (×17): qty 90

## 2020-06-21 MED ORDER — ENOXAPARIN SODIUM 80 MG/0.8ML ~~LOC~~ SOLN
0.5000 mg/kg | SUBCUTANEOUS | Status: DC
  Administered 2020-06-21 – 2020-07-31 (×40): 72.5 mg via SUBCUTANEOUS
  Filled 2020-06-21 (×43): qty 0.8

## 2020-06-21 MED ORDER — VECURONIUM BROMIDE 10 MG IV SOLR
10.0000 mg | INTRAVENOUS | Status: DC | PRN
  Administered 2020-06-24 – 2020-07-06 (×12): 10 mg via INTRAVENOUS
  Filled 2020-06-21 (×14): qty 10

## 2020-06-21 MED ORDER — METOCLOPRAMIDE HCL 5 MG/ML IJ SOLN
5.0000 mg | Freq: Two times a day (BID) | INTRAMUSCULAR | Status: DC
  Administered 2020-06-21 – 2020-06-30 (×19): 5 mg via INTRAVENOUS
  Filled 2020-06-21 (×19): qty 2

## 2020-06-21 MED ORDER — SODIUM CHLORIDE 0.9 % IV SOLN
3.0000 g | Freq: Four times a day (QID) | INTRAVENOUS | Status: DC
  Administered 2020-06-21 – 2020-06-27 (×24): 3 g via INTRAVENOUS
  Filled 2020-06-21 (×3): qty 3
  Filled 2020-06-21: qty 1.32
  Filled 2020-06-21: qty 3
  Filled 2020-06-21: qty 8
  Filled 2020-06-21: qty 1.32
  Filled 2020-06-21 (×2): qty 3
  Filled 2020-06-21 (×2): qty 8
  Filled 2020-06-21: qty 1.32
  Filled 2020-06-21: qty 8
  Filled 2020-06-21 (×4): qty 3
  Filled 2020-06-21: qty 1.32
  Filled 2020-06-21 (×2): qty 3
  Filled 2020-06-21 (×3): qty 1.32
  Filled 2020-06-21: qty 3
  Filled 2020-06-21: qty 8
  Filled 2020-06-21 (×3): qty 3

## 2020-06-21 MED ORDER — PERFLUTREN LIPID MICROSPHERE
1.0000 mL | INTRAVENOUS | Status: AC | PRN
  Administered 2020-06-21: 2 mL via INTRAVENOUS
  Filled 2020-06-21: qty 10

## 2020-06-21 MED ORDER — MIDAZOLAM 50MG/50ML (1MG/ML) PREMIX INFUSION
0.5000 mg/h | INTRAVENOUS | Status: DC
  Administered 2020-06-21: 0.5 mg/h via INTRAVENOUS
  Administered 2020-06-21 (×2): 20 mg/h via INTRAVENOUS
  Administered 2020-06-22: 18 mg/h via INTRAVENOUS
  Administered 2020-06-22 (×2): 10 mg/h via INTRAVENOUS
  Administered 2020-06-22: 20 mg/h via INTRAVENOUS
  Administered 2020-06-22: 8 mg/h via INTRAVENOUS
  Administered 2020-06-22: 20 mg/h via INTRAVENOUS
  Administered 2020-06-23 (×2): 8 mg/h via INTRAVENOUS
  Administered 2020-06-24: 7 mg/h via INTRAVENOUS
  Administered 2020-06-24: 6 mg/h via INTRAVENOUS
  Administered 2020-06-25 (×3): 7 mg/h via INTRAVENOUS
  Administered 2020-06-26 – 2020-06-27 (×6): 10 mg/h via INTRAVENOUS
  Administered 2020-06-27: 5 mg/h via INTRAVENOUS
  Administered 2020-06-27 (×2): 10 mg/h via INTRAVENOUS
  Administered 2020-06-28: 7 mg/h via INTRAVENOUS
  Administered 2020-06-28: 4 mg/h via INTRAVENOUS
  Administered 2020-06-28: 5 mg/h via INTRAVENOUS
  Administered 2020-06-28: 4 mg/h via INTRAVENOUS
  Administered 2020-06-29 – 2020-06-30 (×4): 7 mg/h via INTRAVENOUS
  Administered 2020-06-30: 15 mg/h via INTRAVENOUS
  Administered 2020-06-30: 12 mg/h via INTRAVENOUS
  Administered 2020-06-30: 16 mg/h via INTRAVENOUS
  Administered 2020-06-30: 14 mg/h via INTRAVENOUS
  Administered 2020-06-30: 16 mg/h via INTRAVENOUS
  Administered 2020-07-01: 10 mg/h via INTRAVENOUS
  Administered 2020-07-01: 15.5 mg/h via INTRAVENOUS
  Administered 2020-07-01: 13 mg/h via INTRAVENOUS
  Administered 2020-07-01: 15 mg/h via INTRAVENOUS
  Administered 2020-07-01: 13 mg/h via INTRAVENOUS
  Administered 2020-07-01: 14 mg/h via INTRAVENOUS
  Administered 2020-07-02 (×3): 10 mg/h via INTRAVENOUS
  Administered 2020-07-02 – 2020-07-03 (×3): 8 mg/h via INTRAVENOUS
  Administered 2020-07-03: 10 mg/h via INTRAVENOUS
  Administered 2020-07-03: 6 mg/h via INTRAVENOUS
  Administered 2020-07-04 – 2020-07-05 (×6): 10 mg/h via INTRAVENOUS
  Administered 2020-07-05: 6 mg/h via INTRAVENOUS
  Administered 2020-07-05 – 2020-07-07 (×8): 10 mg/h via INTRAVENOUS
  Filled 2020-06-21 (×68): qty 50

## 2020-06-21 NOTE — Progress Notes (Signed)
ABG results noted. P02 elevated- 144, Fi02 decreased to 40% by NP.

## 2020-06-21 NOTE — Progress Notes (Signed)
Initial Nutrition Assessment  DOCUMENTATION CODES:   Morbid obesity  INTERVENTION:  Initiate Vital AF 1.2 Cal at 20 mL/hr (480 mL goal daily volume) + PROSource TF 90 mL 6 times daily per tube. Provides 1056 kcal, 168 grams of protein, 389 mL H2O daily. With current propofol rate provides 2896 kcal daily.  Provide MVI daily per tube. Continue thiamine 100 mg daily per tube and folic acid 1 mg daily per tube.  Monitor magnesium, potassium, and phosphorus daily for at least 3 days, MD to replete as needed, as pt is at risk for refeeding syndrome.  NUTRITION DIAGNOSIS:   Inadequate oral intake related to inability to eat as evidenced by NPO status.  GOAL:   Patient will meet greater than or equal to 90% of their needs  MONITOR:   Vent status, Labs, Weight trends, TF tolerance, I & O's  REASON FOR ASSESSMENT:   Ventilator, Consult Enteral/tube feeding initiation and management  ASSESSMENT:   39 year old male with PMHx of EtOH abuse, polysubstance abuse, hepatitis C admitted with AMS, acute encephalopathy, EtOH withdrawal, suspected aspiration PNA, AKI, sepsis.   11/16 intubated  Patient is currently intubated on ventilator support MV: 14.4 L/min Temp (24hrs), Avg:98.9 F (37.2 C), Min:98 F (36.7 C), Max:101.5 F (38.6 C)  Propofol: 69.7 ml/hr (1840 kcal daily)  Medications reviewed and include: Colace 100 mg BID, folic acid 1 mg daily, lactulose 20 grams BID, Reglan 5 mg Q12hrs IV, nicotine patch, Protonix, Miralax, thiamine 100 mg daily, Unasyn, fentanyl gtt, Versed gtt, norepinephrine gtt at 14 mcg/min, propofol gtt.  Labs reviewed: CBG 128-139, CO2 20, Creatinine 1.54.  I/O: 1250 mL UOP yesterday (0.4 mL/kg/hr) + 2 occurrences unmeasured UOP  Weight trend: 145.2 kg on 11/15 (320.11 lbs); wts appear to be fluctuating in chart PTA but have been increasing this past year  Enteral Access: OGT placed 11/16; terminates in gastric antrum per abdominal x-ray  11/16  Discussed with RN and on rounds. Plan is to start tube feeds today. Plan is to add Versed gtt and try to decrease Propofol gtt today.  NUTRITION - FOCUSED PHYSICAL EXAM:    Most Recent Value  Orbital Region No depletion  Upper Arm Region No depletion  Thoracic and Lumbar Region No depletion  Buccal Region Unable to assess  Temple Region No depletion  Clavicle Bone Region No depletion  Clavicle and Acromion Bone Region No depletion  Scapular Bone Region Unable to assess  Dorsal Hand No depletion  Patellar Region No depletion  Anterior Thigh Region No depletion  Posterior Calf Region No depletion  Edema (RD Assessment) None  Hair Reviewed  Eyes Unable to assess  Mouth Unable to assess  Skin Reviewed  Nails Reviewed     Diet Order:   Diet Order            Diet NPO time specified  Diet effective now                EDUCATION NEEDS:   No education needs have been identified at this time  Skin:  Skin Assessment: Reviewed RN Assessment  Last BM:  06/19/2020 per chart  Height:   Ht Readings from Last 1 Encounters:  06/19/20 5\' 10"  (1.778 m)   Weight:   Wt Readings from Last 1 Encounters:  06/19/20 (!) 145.2 kg   Ideal Body Weight:  75.5 kg  BMI:  Body mass index is 45.93 kg/m.  Estimated Nutritional Needs:   Kcal:  06/21/20 (11-14 kcal/kg)  Protein:  up to 189 grams (2.5 grams/kg IBW)  Fluid:  >/= 2.2 L/day  Felix Pacini, MS, RD, LDN Pager number available on Amion

## 2020-06-21 NOTE — Progress Notes (Signed)
Pharmacy Antibiotic Note  Nicholas Valdez is a 39 y.o. male admitted on 06/19/2020. Patient with fever and increase in WBC. Trach aspirate collected. Pharmacy has been consulted for Unasyn dosing.  Plan: Unasyn 3 g IV q6h  Height: 5\' 10"  (177.8 cm) Weight: (!) 145.2 kg (320 lb 1.7 oz) IBW/kg (Calculated) : 73  Temp (24hrs), Avg:98.9 F (37.2 C), Min:98 F (36.7 C), Max:101.5 F (38.6 C)  Recent Labs  Lab 06/19/20 1953 06/19/20 2209 06/20/20 0348 06/20/20 1032 06/20/20 1325 06/21/20 0633 06/21/20 0941  WBC 5.5  --  8.2  --   --  17.5*  --   CREATININE 0.80  --  0.88  --   --  1.54*  --   LATICACIDVEN  --  3.3*  --  2.5* 2.1*  --  1.1    Estimated Creatinine Clearance: 95.6 mL/min (A) (by C-G formula based on SCr of 1.54 mg/dL (H)).    Allergies  Allergen Reactions  . Ondansetron Hives  . Promethazine Hives  . Penicillins Hives    Did it involve swelling of the face/tongue/throat, SOB, or low BP? Yes Did it involve sudden or severe rash/hives, skin peeling, or any reaction on the inside of your mouth or nose? Yes Did you need to seek medical attention at a hospital or doctor's office? Yes When did it last happen?childhood If all above answers are "NO", may proceed with cephalosporin use.  06/23/20 Zofran [Ondansetron Hcl] Hives    Antimicrobials this admission: Unasyn 11/17 >>   Microbiology results: 11/17 Sputum: pending  11/16 MRSA PCR: negative  Thank you for allowing pharmacy to be a part of this patient's care.  12/16, PharmD 06/21/2020 2:28 PM

## 2020-06-21 NOTE — Progress Notes (Signed)
Anticoagulation monitoring(Lovenox):  38yo  M ordered Lovenox 40 mg Q24h  Filed Weights   06/19/20 1954  Weight: (!) 145.2 kg (320 lb 1.7 oz)   BMI 45.9   Lab Results  Component Value Date   CREATININE 1.54 (H) 06/21/2020   CREATININE 0.88 06/20/2020   CREATININE 0.80 06/19/2020   Estimated Creatinine Clearance: 95.6 mL/min (A) (by C-G formula based on SCr of 1.54 mg/dL (H)). Hemoglobin & Hematocrit     Component Value Date/Time   HGB 13.4 06/21/2020 0633   HGB 16.1 02/12/2013 2028   HCT 39.0 06/21/2020 0633   HCT 47.2 02/12/2013 2028     Per Protocol for Patient with estCrcl > 30 ml/min and BMI > 30, will transition to Lovenox 0.5 mg/kg  Q24h (weight= 145.2 kg)     Bari Mantis PharmD Clinical Pharmacist 06/21/2020

## 2020-06-21 NOTE — Progress Notes (Signed)
Pt maintained on sedation due to vent asynchrony RASS goal to keep synchronous with vent, -5 at this time due to prn paralytic being given to maintain vent synchrony.  Wake up assessment deferred per NP this am due to pt condition.

## 2020-06-21 NOTE — Progress Notes (Signed)
*  PRELIMINARY RESULTS* Echocardiogram 2D Echocardiogram has been performed.  Nicholas Valdez 06/21/2020, 4:03 PM

## 2020-06-21 NOTE — Progress Notes (Signed)
Marcetia, care RN reported that the patient remains intermittently agitated while at max doses of fentanyl, versed & propofol. After slight stimulation patient attempting to get OOB- managing to get foot on the floor requiring staff to manually restrain him in the bed briefly. Dicussed case with Dr. Belia Heman bedside, patient VSS, currently subdued.  P: -versed drip range increased - will start Dilaudid drip and titrate off fentanyl drip - non violent bilateral wrist restraints ordered. - STAT CXR to verify ETT placement.  Cheryll Cockayne Rust-Chester, AGACNP-BC Acute Care Nurse Practitioner [facility]   [phone] / [phone] Please see Amion for pager details.

## 2020-06-21 NOTE — Progress Notes (Signed)
NAME:  Nicholas Valdez, MRN:  [id number], DOB:  1981/07/15, LOS: 2 ADMISSION DATE:  06/19/2020, CONSULTATION DATE:  06/20/2020 REFERRING MD:  Dr. Alanda Slim, CHIEF COMPLAINT:  Altered Mental Status   Brief History   39 yo male significant history of ETOH and polysubstance abuse presented to the ED with altered mental status admitted to the ICU requiring precedex drip and ultimately intubation & mechanical ventilation.  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 daily (per mother) Hepatitis C Polysubstance abuse  Significant Hospital Events   06/20/20 Admit to ICU, intubated  Consults:    Procedures:  06/20/20 ETT >> 06/20/20 CVC 3 L >>  Significant Diagnostic Tests:  06/19/20 CT head >> no acute intracranial abnormality 06/20/20 US abdomen >> mild hepatic steatosis Micro Data:  06/19/20 COVID-19 >> negative 06/19/20 Influenza PCR A/B >> negative 06/20/20 MRSA PCR >> negative 06/20/20 tracheal aspirate >>  Antimicrobials:  06/21/20 Unasyn >>  Interim history/subjective:  Patient intubated and sedated, overnight agitation requiring additional doses of versed and vecuronium IVP for asynchrony with ventilator. WBC doubled 11/17, concern for aspiration PNA, PCT pending, CXR inconclusive Now requiring vasopressors  Labs/ Imaging personally reviewed Net: +1.8 L (+2.8 L since admit) Na+/ K+: 136/ 4.4 BUN/Cr.: 12/ 1.54 Hgb: 13.4  WBC/ TMAX: 17.5/ 101.4 Lactic/ PCT: pending/ 0.12  ABG: 11/16- 7.34/ 38/ 107/ 20.5 CXR 06/21/20: unchanged atelectasis and mild vascular congestion without frank edema, lines and tubes stable. Objective   Blood pressure (!) 101/58, pulse 72, temperature 98.4 F (36.9 C), temperature source Axillary, resp. rate 19, height 5\' 10"  (1.778 m), weight (!) 145.2 kg, SpO2 95 %.    Vent Mode: PRVC FiO2 (%):  [30 %] 30 % Set Rate:  [16 bmp] 16 bmp Vt Set:  [550 mL] 550 mL PEEP:  [5 cmH20] 5 cmH20 Plateau Pressure:  [14 cmH20-19 cmH20]  17 cmH20   Intake/Output Summary (Last 24 hours) at 06/21/2020 06/23/2020 Last data filed at 06/21/2020 06/23/2020 Gross per 24 hour  Intake 3116.27 ml  Output 1250 ml  Net 1866.27 ml   Filed Weights   06/19/20 1954  Weight: (!) 145.2 kg    Examination: General: Adult male, critically ill, lying in bed intubated & sedated requiring mechanical ventilation HEENT: MM pink/moist, anicteric, atraumatic, neck supple Neuro: sedated RASS: -3, unable commands, PERRL +3, MAE CV: s1s2 RRR, ST on monitor, no r/m/g Pulm: Regular, non labored on PRVC: 50% & PEEP 5, breath sounds rhonchi-BUL & diminished-BLL GI: soft, rounded, bs x 4 GU: foley in place with cloudy yellow urine Skin: scattered ecchymosis no rashes/lesions noted Extremities: warm/dry, pulses + 2 R/P, no edema noted  Resolved Hospital Problem list     Assessment & Plan:  Acute Hypoxic Respiratory failure secondary to acute encephalopathy due to Alcohol withdrawal Suspected Aspiration Pneumonia Patient agitated due to ETOH withdrawal, obtunded with medication administration- unable to protect airway and intubated on 11/16. Concerns for aspiration PNA overnight due to copious secretions and increased FiO2 requirements, febrile this AM. - Ventilator settings: PRVC adjusted to PC 8 mL/kg (due to TV needs), 50% FiO2, 5 PEEP, continue ventilator support & lung protective strategies - Wean PEEP & FiO2 as tolerated, maintain SpO2 > 90% - Head of bed elevated 30 degrees, VAP protocol in place - Plateau pressures less than 30 cm H20  - Intermittent chest x-ray & ABG PRN - Daily WUA with SBT as tolerated  - Ensure adequate pulmonary hygiene  - F/u cultures, trend PCT -  started Unasyn for aspiration PNA coverage - PAD protocol in place: continue Fentanyl drip & Propofol drip with versed IVP PRN and vecuronium IVP for ventilator synchrony  Acute Encephalopathy due to ETOH withdrawal Hepatic Steatosis Polysubstance abuse Ammonia- trending down: 76  ~ 47 ~ 38 - continue lactulose - CIWA protocol - continue thiamine & folic acid - librium ordered QID - trend ammonia - daily CMP - continue IVF @ 50 mL - echocardiogram ordered to rule out alcohol induced cardiomyopathy  Acute Kidney Injury  In the setting of sepsis. Baseline creatinine 0.88, creatinine on admission 1.54 this AM (11/17) - Strict I/O's: alert provider if UOP < 0.5 mL/kg/hr - gentle IVF hydration  - Daily CMP, replace electrolytes PRN - Avoid nephrotoxic agents as able, ensure adequate renal perfusion  Sepsis with suspected Septic Shock due to suspected Aspiration Pneumonia Lactic: 3.3 ~ 2.5 ~ 2.1 Increased secretions, FiO2 and pressor requirements overnight - Unasyn ordered per pharmacy for aspiration coverage - continue levophed drip, to maintain MAP > 65 - continuous cardiac monitoring  Hypoalbuminia in the setting of ETOH abuse - trickle TF started - reglan BID for suspected gastritis - Q 4 CBG  Best practice:  Diet: NPO Pain/Anxiety/Delirium protocol (if indicated): fentanyl & propofol VAP protocol (if indicated): initiated DVT prophylaxis: enoxaparin SQ GI prophylaxis: protonix Glucose control: monitor Q 4 Mobility: bedrest, mobilize as tolerated Code Status: FULL Family Communication: will update parents today- 06/21/20 Disposition: ICU  Labs   CBC: Recent Labs  Lab 06/19/20 1953 06/20/20 0348 06/21/20 0633  WBC 5.5 8.2 17.5*  NEUTROABS  --  4.7 13.1*  HGB 13.3 13.0 13.4  HCT 36.9* 37.4* 39.0  MCV 102.5* 105.1* 108.6*  PLT 287 229 316    Basic Metabolic Panel: Recent Labs  Lab 06/19/20 1953 06/19/20 2209 06/20/20 0348 06/21/20 0633  NA 138  --  141 136  K 3.5  --  3.8 4.4  CL 106  --  112* 106  CO2 21*  --  18* 20*  GLUCOSE 137*  --  95 133*  BUN 7  --  7 12  CREATININE 0.80  --  0.88 1.54*  CALCIUM 7.8*  --  7.3* 7.2*  MG  --  1.9 1.9  --   PHOS  --  4.7* 2.9  --    GFR: Estimated Creatinine Clearance: 95.6 mL/min (A)  (by C-G formula based on SCr of 1.54 mg/dL (H)). Recent Labs  Lab 06/19/20 1953 06/19/20 2209 06/20/20 0348 06/20/20 1032 06/20/20 1325 06/21/20 0633  WBC 5.5  --  8.2  --   --  17.5*  LATICACIDVEN  --  3.3*  --  2.5* 2.1*  --     Liver Function Tests: Recent Labs  Lab 06/19/20 1953 06/20/20 0348  AST 54* 62*  ALT 45* 46*  ALKPHOS 47 44  BILITOT 0.8 1.0  PROT 7.1 6.7  ALBUMIN 3.6 3.4*   No results for input(s): LIPASE, AMYLASE in the last 168 hours. Recent Labs  Lab 06/19/20 2043 06/20/20 0348 06/20/20 1032  AMMONIA 76* 47* 38*    ABG    Component Value Date/Time   PHART 7.34 (L) 06/20/2020 1630   PCO2ART 38 06/20/2020 1630   PO2ART 107 06/20/2020 1630   HCO3 20.5 06/20/2020 1630   ACIDBASEDEF 4.8 (H) 06/20/2020 1630   O2SAT 97.8 06/20/2020 1630     Coagulation Profile: Recent Labs  Lab 06/19/20 2209  INR 1.0    Cardiac Enzymes: Recent Labs  Lab  06/19/20 2209 06/21/20 0633  CKTOTAL 138 107    HbA1C: No results found for: HGBA1C  CBG: Recent Labs  Lab 06/20/20 0520 06/20/20 1915 06/21/20 0711  GLUCAP 101* 124* 128*    Critical care time: 45 minutes       Betsey Holiday, AGACNP-BC Acute Care Nurse Practitioner [facility]   [phone] / [phone] Please see Amion for pager details.

## 2020-06-22 DIAGNOSIS — F10231 Alcohol dependence with withdrawal delirium: Secondary | ICD-10-CM | POA: Diagnosis not present

## 2020-06-22 DIAGNOSIS — R7989 Other specified abnormal findings of blood chemistry: Secondary | ICD-10-CM | POA: Diagnosis not present

## 2020-06-22 DIAGNOSIS — G9341 Metabolic encephalopathy: Secondary | ICD-10-CM | POA: Diagnosis not present

## 2020-06-22 LAB — COMPREHENSIVE METABOLIC PANEL
ALT: 25 U/L (ref 0–44)
AST: 31 U/L (ref 15–41)
Albumin: 2.9 g/dL — ABNORMAL LOW (ref 3.5–5.0)
Alkaline Phosphatase: 54 U/L (ref 38–126)
Anion gap: 12 (ref 5–15)
BUN: 16 mg/dL (ref 6–20)
CO2: 16 mmol/L — ABNORMAL LOW (ref 22–32)
Calcium: 7 mg/dL — ABNORMAL LOW (ref 8.9–10.3)
Chloride: 112 mmol/L — ABNORMAL HIGH (ref 98–111)
Creatinine, Ser: 2.46 mg/dL — ABNORMAL HIGH (ref 0.61–1.24)
GFR, Estimated: 34 mL/min — ABNORMAL LOW (ref >60)
Glucose, Bld: 120 mg/dL — ABNORMAL HIGH (ref 70–99)
Potassium: 3.3 mmol/L — ABNORMAL LOW (ref 3.5–5.1)
Sodium: 140 mmol/L (ref 135–145)
Total Bilirubin: 1.4 mg/dL — ABNORMAL HIGH (ref 0.3–1.2)
Total Protein: 5.9 g/dL — ABNORMAL LOW (ref 6.5–8.1)

## 2020-06-22 LAB — PHOSPHORUS: Phosphorus: 3.9 mg/dL (ref 2.5–4.6)

## 2020-06-22 LAB — CBC WITH DIFFERENTIAL/PLATELET
Abs Immature Granulocytes: 0.08 10*3/uL — ABNORMAL HIGH (ref 0.00–0.07)
Basophils Absolute: 0 10*3/uL (ref 0.0–0.1)
Basophils Relative: 0 %
Eosinophils Absolute: 0.5 10*3/uL (ref 0.0–0.5)
Eosinophils Relative: 4 %
HCT: 31.4 % — ABNORMAL LOW (ref 39.0–52.0)
Hemoglobin: 10.7 g/dL — ABNORMAL LOW (ref 13.0–17.0)
Immature Granulocytes: 1 %
Lymphocytes Relative: 12 %
Lymphs Abs: 1.4 10*3/uL (ref 0.7–4.0)
MCH: 36.6 pg — ABNORMAL HIGH (ref 26.0–34.0)
MCHC: 34.1 g/dL (ref 30.0–36.0)
MCV: 107.5 fL — ABNORMAL HIGH (ref 80.0–100.0)
Monocytes Absolute: 0.9 10*3/uL (ref 0.1–1.0)
Monocytes Relative: 8 %
Neutro Abs: 8.9 10*3/uL — ABNORMAL HIGH (ref 1.7–7.7)
Neutrophils Relative %: 75 %
Platelets: 186 10*3/uL (ref 150–400)
RBC: 2.92 MIL/uL — ABNORMAL LOW (ref 4.22–5.81)
RDW: 15.1 % (ref 11.5–15.5)
WBC: 11.8 10*3/uL — ABNORMAL HIGH (ref 4.0–10.5)
nRBC: 0 % (ref 0.0–0.2)

## 2020-06-22 LAB — MAGNESIUM: Magnesium: 1.8 mg/dL (ref 1.7–2.4)

## 2020-06-22 LAB — BLOOD GAS, ARTERIAL
Acid-base deficit: 9.4 mmol/L — ABNORMAL HIGH (ref 0.0–2.0)
Bicarbonate: 15.5 mmol/L — ABNORMAL LOW (ref 20.0–28.0)
FIO2: 0.4
O2 Saturation: 98.6 %
PEEP: 5 cmH2O
Patient temperature: 37
Pressure control: 22 cmH2O
RATE: 16 resp/min
pCO2 arterial: 30 mmHg — ABNORMAL LOW (ref 32.0–48.0)
pH, Arterial: 7.32 — ABNORMAL LOW (ref 7.350–7.450)
pO2, Arterial: 125 mmHg — ABNORMAL HIGH (ref 83.0–108.0)

## 2020-06-22 LAB — GLUCOSE, CAPILLARY
Glucose-Capillary: 100 mg/dL — ABNORMAL HIGH (ref 70–99)
Glucose-Capillary: 101 mg/dL — ABNORMAL HIGH (ref 70–99)
Glucose-Capillary: 108 mg/dL — ABNORMAL HIGH (ref 70–99)
Glucose-Capillary: 114 mg/dL — ABNORMAL HIGH (ref 70–99)
Glucose-Capillary: 118 mg/dL — ABNORMAL HIGH (ref 70–99)
Glucose-Capillary: 96 mg/dL (ref 70–99)

## 2020-06-22 LAB — AMMONIA: Ammonia: 41 umol/L — ABNORMAL HIGH (ref 9–35)

## 2020-06-22 LAB — PROCALCITONIN: Procalcitonin: 0.35 ng/mL

## 2020-06-22 LAB — TRIGLYCERIDES: Triglycerides: 240 mg/dL — ABNORMAL HIGH (ref <150)

## 2020-06-22 LAB — CK: Total CK: 722 U/L — ABNORMAL HIGH (ref 49–397)

## 2020-06-22 MED ORDER — DOCUSATE SODIUM 50 MG/5ML PO LIQD
100.0000 mg | Freq: Two times a day (BID) | ORAL | Status: DC
  Administered 2020-06-22 – 2020-06-28 (×13): 100 mg
  Filled 2020-06-22 (×13): qty 10

## 2020-06-22 MED ORDER — LACTULOSE 10 GM/15ML PO SOLN
20.0000 g | Freq: Two times a day (BID) | ORAL | Status: DC
  Administered 2020-06-22 – 2020-07-06 (×29): 20 g
  Filled 2020-06-22 (×30): qty 30

## 2020-06-22 MED ORDER — MAGNESIUM SULFATE 2 GM/50ML IV SOLN
2.0000 g | Freq: Once | INTRAVENOUS | Status: AC
  Administered 2020-06-22: 2 g via INTRAVENOUS
  Filled 2020-06-22: qty 50

## 2020-06-22 MED ORDER — PHENOBARBITAL 20 MG/5ML PO ELIX
60.0000 mg | ORAL_SOLUTION | Freq: Two times a day (BID) | ORAL | Status: AC
  Administered 2020-06-24 (×2): 60 mg
  Filled 2020-06-22 (×2): qty 15

## 2020-06-22 MED ORDER — PHENOBARBITAL 20 MG/5ML PO ELIX
60.0000 mg | ORAL_SOLUTION | Freq: Three times a day (TID) | ORAL | Status: AC
  Administered 2020-06-23 (×3): 60 mg
  Filled 2020-06-22 (×4): qty 15

## 2020-06-22 MED ORDER — FOLIC ACID 1 MG PO TABS
1.0000 mg | ORAL_TABLET | Freq: Every day | ORAL | Status: DC
  Administered 2020-06-22 – 2020-07-30 (×39): 1 mg
  Filled 2020-06-22 (×40): qty 1

## 2020-06-22 MED ORDER — THIAMINE HCL 100 MG/ML IJ SOLN
100.0000 mg | Freq: Every day | INTRAMUSCULAR | Status: DC
  Administered 2020-06-22 – 2020-06-25 (×2): 100 mg via INTRAVENOUS
  Filled 2020-06-22 (×2): qty 2

## 2020-06-22 MED ORDER — PHENOBARBITAL 20 MG/5ML PO ELIX
60.0000 mg | ORAL_SOLUTION | Freq: Four times a day (QID) | ORAL | Status: AC
  Administered 2020-06-22 – 2020-06-23 (×3): 60 mg
  Filled 2020-06-22 (×5): qty 15

## 2020-06-22 MED ORDER — POLYETHYLENE GLYCOL 3350 17 G PO PACK
17.0000 g | PACK | Freq: Every day | ORAL | Status: DC
  Administered 2020-06-22 – 2020-06-28 (×5): 17 g
  Filled 2020-06-22 (×6): qty 1

## 2020-06-22 MED ORDER — THIAMINE HCL 100 MG PO TABS
100.0000 mg | ORAL_TABLET | Freq: Every day | ORAL | Status: DC
  Administered 2020-06-23 – 2020-06-26 (×3): 100 mg
  Filled 2020-06-22 (×4): qty 1

## 2020-06-22 MED ORDER — PROPOFOL 1000 MG/100ML IV EMUL
5.0000 ug/kg/min | INTRAVENOUS | Status: DC
  Administered 2020-06-22 – 2020-06-23 (×2): 10 ug/kg/min via INTRAVENOUS
  Administered 2020-06-24: 20 ug/kg/min via INTRAVENOUS
  Administered 2020-06-25 (×4): 40 ug/kg/min via INTRAVENOUS
  Administered 2020-06-25: 30 ug/kg/min via INTRAVENOUS
  Administered 2020-06-25 (×2): 40 ug/kg/min via INTRAVENOUS
  Filled 2020-06-22 (×9): qty 100

## 2020-06-22 MED ORDER — POTASSIUM CHLORIDE 20 MEQ/15ML (10%) PO SOLN
40.0000 meq | Freq: Once | ORAL | Status: AC
  Administered 2020-06-22: 40 meq
  Filled 2020-06-22: qty 30

## 2020-06-22 MED ORDER — PHENOBARBITAL 20 MG/5ML PO ELIX
30.0000 mg | ORAL_SOLUTION | Freq: Two times a day (BID) | ORAL | Status: AC
  Administered 2020-06-25 (×2): 30 mg
  Filled 2020-06-22 (×3): qty 7.5

## 2020-06-22 MED ORDER — STERILE WATER FOR INJECTION IV SOLN
INTRAVENOUS | Status: AC
  Filled 2020-06-22: qty 150

## 2020-06-22 NOTE — Progress Notes (Signed)
NAME:  Nicholas Valdez, MRN:  [id number], DOB:  [birth year], LOS: 3 ADMISSION DATE:  06/19/2020, CONSULTATION DATE:  06/20/2020 REFERRING MD:  Dr. Alanda Slim, CHIEF COMPLAINT:  Altered Mental Status   Brief History   39 yo male significant history of ETOH and polysubstance abuse presented to the ED with altered mental status admitted to the ICU requiring precedex drip and ultimately intubation & mechanical ventilation.  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 daily (per mother) Hepatitis C Polysubstance abuse  Significant Hospital Events   06/20/20 Admit to ICU, intubated  Consults:    Procedures:  06/20/20 ETT >> 06/20/20 CVC 3 L >>  Significant Diagnostic Tests:  06/19/20 CT head >> no acute intracranial abnormality 06/20/20 US abdomen >> mild hepatic steatosis 06/21/20 Echocardiogram >> LVEF 65-70%, no wall motion abnormalities, no other abnormalities noted Micro Data:  06/19/20 COVID-19 >> negative 06/19/20 Influenza PCR A/B >> negative 06/20/20 MRSA PCR >> negative 06/20/20 tracheal aspirate >>  Antimicrobials:  06/21/20 Unasyn >>  Interim history/subjective:  Patient intubated and sedated, overnight RASS: -5 no acute events. Plan today to aggressively titrate down on versed drip, fentanyl drip discontinued.  Labs/ Imaging personally reviewed Net: +800 mL (+3.6L since admit) Na+/ K+: 140/ 3.3-replaced overnight BUN/Cr.: 16/ 2.46- increased Hgb: 10.7 CK: 722 - increased  WBC/ TMAX: 11.8/ 37.9 Lactic/ PCT: 1.1/ 0.35  ABG: 7.32/ 30/ 125/ 15.5 CXR 06/21/20: Increased bibasilar infiltrates - 18:00  Objective   Blood pressure 128/67, pulse 84, temperature 99.5 F (37.5 C), temperature source Oral, resp. rate 16, height 5\' 10"  (1.778 m), weight (!) 150.9 kg, SpO2 99 %.    Vent Mode: PCV FiO2 (%):  [40 %-50 %] 40 % Set Rate:  [16 bmp] 16 bmp Vt Set:  [550 mL] 550 mL PEEP:  [5 cmH20] 5 cmH20 Plateau Pressure:  [15 cmH20] 15 cmH20    Intake/Output Summary (Last 24 hours) at 06/22/2020 0719 Last data filed at 06/22/2020 06/24/2020 Gross per 24 hour  Intake 4406.74 ml  Output 3600 ml  Net 806.74 ml   Filed Weights   06/19/20 1954 06/22/20 0352  Weight: (!) 145.2 kg (!) 150.9 kg    Examination: General: Adult male, critically ill, lying in bed intubated & sedated requiring mechanical ventilation  HEENT: MM pink/moist, anicteric, atraumatic, neck supple Neuro: sedated, RASS -5, unable to follow commands, PERRL +3, MAE CV: s1s2 RRR, NSR on monitor, no r/m/g Pulm: Regular, non labored on PCV: 40%/ PEEP 5, breath sounds rhonchi-BUL & diminished-BLL GI: soft, rounded, bs x 4 GU: foley in place with yellow urine sediment Skin: scattered ecchymosis no rashes/lesions noted Extremities: warm/dry, pulses + 2 R/P, no edema noted  Resolved Hospital Problem list     Assessment & Plan:  Acute Hypoxic Respiratory failure secondary to acute encephalopathy due to Alcohol withdrawal Suspected Aspiration Pneumonia Patient agitated due to ETOH withdrawal, obtunded with medication administration- unable to protect airway and intubated on 11/16. Concerns for aspiration PNA overnight due to copious secretions and increased FiO2 requirements, febrile 06/21/20 - Ventilator settings: PCV 8 mL/kg, 30% FiO2, 5 PEEP, continue ventilator support & lung protective strategies - Wean PEEP & FiO2 as tolerated, maintain SpO2 > 90% - Head of bed elevated 30 degrees, VAP protocol in place - Plateau pressures less than 30 cm H20  - Intermittent chest x-ray & ABG PRN - Daily WUA with SBT as tolerated  - Ensure adequate pulmonary hygiene  - F/u cultures, trend PCT - PAD  protocol in place: continue Dilaudid drip & Propofol & Versed drip - plan to titrate down on propofol drip as tolerated, leaving the patient on dilaudid & versed - continue vecuronium IVP PRN to maintain ventilator synchrony - continue Unasyn for aspiration pneumonia  coverage  Acute Encephalopathy due to ETOH withdrawal Hepatic Steatosis Polysubstance abuse Ammonia- trending down: 76 ~ 47 ~ 38 - continue lactulose - CIWA protocol - continue thiamine & folic acid - continue librium QID - trend ammonia - daily CMP - continue IVF resuscitation  Acute Kidney Injury  In the setting of sepsis. Baseline creatinine 0.88, creatinine on admission 1.54 this AM (11/17). Cr. Trending up 2.46/ serum CO2 16/ CK 722 - ordered sodium bicarb infusion for 12 h 150 meq at 75 mL/h - continue IVF hydration @ 50 mL/h - Strict I/O's: alert provider if UOP < 0.5 mL/kg/hr - Daily CMP, replace electrolytes PRN - Avoid nephrotoxic agents as able, ensure adequate renal perfusion  Sepsis with suspected Septic Shock due to suspected Aspiration Pneumonia Lactic: 3.3 ~ 2.5 ~ 2.1~1.1 PCT: 0.12~0.35 Increased secretions, FiO2 and pressor requirements overnight- 06/20/20 - Unasyn ordered per pharmacy for aspiration coverage - continue levophed drip, maintain MAP > 65 - continuous cardiac monitoring  Hypoalbuminia in the setting of ETOH abuse - continue trickle TF - continue reglan BID for suspected gastritis - Q 4 CBG  Best practice:  Diet: NPO Pain/Anxiety/Delirium protocol (if indicated): fentanyl & propofol & versed VAP protocol (if indicated): initiated DVT prophylaxis: enoxaparin SQ GI prophylaxis: protonix Glucose control: monitor Q 4 Mobility: bedrest, mobilize as tolerated Code Status: FULL Family Communication:left VM 11/17, will update 11/18 PRN Disposition: ICU  Labs   CBC: Recent Labs  Lab 06/19/20 1953 06/20/20 0348 06/21/20 0633 06/22/20 0508  WBC 5.5 8.2 17.5* 11.8*  NEUTROABS  --  4.7 13.1* 8.9*  HGB 13.3 13.0 13.4 10.7*  HCT 36.9* 37.4* 39.0 31.4*  MCV 102.5* 105.1* 108.6* 107.5*  PLT 287 229 316 186    Basic Metabolic Panel: Recent Labs  Lab 06/19/20 1953 06/19/20 2209 06/20/20 0348 06/21/20 0633 06/22/20 0508  NA 138  --  141  136 140  K 3.5  --  3.8 4.4 3.3*  CL 106  --  112* 106 112*  CO2 21*  --  18* 20* 16*  GLUCOSE 137*  --  95 133* 120*  BUN 7  --  7 12 16   CREATININE 0.80  --  0.88 1.54* 2.46*  CALCIUM 7.8*  --  7.3* 7.2* 7.0*  MG  --  1.9 1.9 2.0 1.8  PHOS  --  4.7* 2.9 4.6 3.9   GFR: Estimated Creatinine Clearance: 61.2 mL/min (A) (by C-G formula based on SCr of 2.46 mg/dL (H)). Recent Labs  Lab 06/19/20 1953 06/19/20 2209 06/20/20 0348 06/20/20 1032 06/20/20 1325 06/21/20 0633 06/21/20 0941 06/22/20 0508  PROCALCITON  --   --   --   --   --  0.12  --  0.35  WBC 5.5  --  8.2  --   --  17.5*  --  11.8*  LATICACIDVEN  --  3.3*  --  2.5* 2.1*  --  1.1  --     Liver Function Tests: Recent Labs  Lab 06/19/20 1953 06/20/20 0348 06/22/20 0508  AST 54* 62* 31  ALT 45* 46* 25  ALKPHOS 47 44 54  BILITOT 0.8 1.0 1.4*  PROT 7.1 6.7 5.9*  ALBUMIN 3.6 3.4* 2.9*   No results for input(s):  LIPASE, AMYLASE in the last 168 hours. Recent Labs  Lab 06/19/20 2043 06/20/20 0348 06/20/20 1032 06/22/20 0508  AMMONIA 76* 47* 38* 41*    ABG    Component Value Date/Time   PHART 7.32 (L) 06/22/2020 0505   PCO2ART 30 (L) 06/22/2020 0505   PO2ART 125 (H) 06/22/2020 0505   HCO3 15.5 (L) 06/22/2020 0505   ACIDBASEDEF 9.4 (H) 06/22/2020 0505   O2SAT 98.6 06/22/2020 0505     Coagulation Profile: Recent Labs  Lab 06/19/20 2209  INR 1.0    Cardiac Enzymes: Recent Labs  Lab 06/19/20 2209 06/21/20 0633 06/22/20 0508  CKTOTAL 138 107 722*    HbA1C: No results found for: HGBA1C  CBG: Recent Labs  Lab 06/21/20 1104 06/21/20 1528 06/21/20 1914 06/21/20 2324 06/22/20 0310  GLUCAP 139* 121* 120* 105* 114*    Critical care time: 45 minutes       Betsey Holiday, AGACNP-BC Acute Care Nurse Practitioner [facility]   [phone] / [phone] Please see Amion for pager details.

## 2020-06-22 NOTE — Progress Notes (Signed)
Pt continues to have periods of intermittent agitation and attempting to self extubate despite versed and dilaudid gtts.  Therefore, will renew bilateral wrist restraints for another 24hrs.  Will continue to monitor and assess pt.  Sonda Rumble, AGNP  Pulmonary/Critical Care Pager [phone] (please enter 7 digits) PCCM Consult Pager [phone] (please enter 7 digits)

## 2020-06-23 LAB — CBC WITH DIFFERENTIAL/PLATELET
Abs Immature Granulocytes: 0.05 10*3/uL (ref 0.00–0.07)
Basophils Absolute: 0 10*3/uL (ref 0.0–0.1)
Basophils Relative: 0 %
Eosinophils Absolute: 0.4 10*3/uL (ref 0.0–0.5)
Eosinophils Relative: 4 %
HCT: 27.4 % — ABNORMAL LOW (ref 39.0–52.0)
Hemoglobin: 9.7 g/dL — ABNORMAL LOW (ref 13.0–17.0)
Immature Granulocytes: 1 %
Lymphocytes Relative: 13 %
Lymphs Abs: 1.3 10*3/uL (ref 0.7–4.0)
MCH: 37.6 pg — ABNORMAL HIGH (ref 26.0–34.0)
MCHC: 35.4 g/dL (ref 30.0–36.0)
MCV: 106.2 fL — ABNORMAL HIGH (ref 80.0–100.0)
Monocytes Absolute: 1 10*3/uL (ref 0.1–1.0)
Monocytes Relative: 10 %
Neutro Abs: 7.4 10*3/uL (ref 1.7–7.7)
Neutrophils Relative %: 72 %
Platelets: 176 10*3/uL (ref 150–400)
RBC: 2.58 MIL/uL — ABNORMAL LOW (ref 4.22–5.81)
RDW: 15.6 % — ABNORMAL HIGH (ref 11.5–15.5)
WBC: 10.1 10*3/uL (ref 4.0–10.5)
nRBC: 0.2 % (ref 0.0–0.2)

## 2020-06-23 LAB — GLUCOSE, CAPILLARY
Glucose-Capillary: 87 mg/dL (ref 70–99)
Glucose-Capillary: 92 mg/dL (ref 70–99)
Glucose-Capillary: 96 mg/dL (ref 70–99)
Glucose-Capillary: 97 mg/dL (ref 70–99)
Glucose-Capillary: 112 mg/dL — ABNORMAL HIGH (ref 70–99)

## 2020-06-23 LAB — COMPREHENSIVE METABOLIC PANEL
ALT: 19 U/L (ref 0–44)
AST: 34 U/L (ref 15–41)
Albumin: 2.5 g/dL — ABNORMAL LOW (ref 3.5–5.0)
Alkaline Phosphatase: 45 U/L (ref 38–126)
Anion gap: 9 (ref 5–15)
BUN: 26 mg/dL — ABNORMAL HIGH (ref 6–20)
CO2: 19 mmol/L — ABNORMAL LOW (ref 22–32)
Calcium: 7.4 mg/dL — ABNORMAL LOW (ref 8.9–10.3)
Chloride: 114 mmol/L — ABNORMAL HIGH (ref 98–111)
Creatinine, Ser: 2.66 mg/dL — ABNORMAL HIGH (ref 0.61–1.24)
GFR, Estimated: 31 mL/min — ABNORMAL LOW (ref >60)
Glucose, Bld: 106 mg/dL — ABNORMAL HIGH (ref 70–99)
Potassium: 3.1 mmol/L — ABNORMAL LOW (ref 3.5–5.1)
Sodium: 142 mmol/L (ref 135–145)
Total Bilirubin: 1 mg/dL (ref 0.3–1.2)
Total Protein: 5.8 g/dL — ABNORMAL LOW (ref 6.5–8.1)

## 2020-06-23 LAB — MAGNESIUM: Magnesium: 2.6 mg/dL — ABNORMAL HIGH (ref 1.7–2.4)

## 2020-06-23 LAB — CK: Total CK: 1276 U/L — ABNORMAL HIGH (ref 49–397)

## 2020-06-23 LAB — PHOSPHORUS: Phosphorus: 4.3 mg/dL (ref 2.5–4.6)

## 2020-06-23 LAB — PROCALCITONIN: Procalcitonin: 0.45 ng/mL

## 2020-06-23 MED ORDER — VITAL AF 1.2 CAL PO LIQD
1000.0000 mL | ORAL | Status: DC
  Administered 2020-06-24 – 2020-06-27 (×5): 1000 mL

## 2020-06-23 MED ORDER — POTASSIUM CHLORIDE 20 MEQ PO PACK
40.0000 meq | PACK | ORAL | Status: AC
  Administered 2020-06-23 (×2): 40 meq
  Filled 2020-06-23 (×2): qty 2

## 2020-06-23 MED ORDER — PROSOURCE TF PO LIQD
90.0000 mL | Freq: Every day | ORAL | Status: DC
  Administered 2020-06-23 – 2020-07-11 (×82): 90 mL
  Filled 2020-06-23 (×92): qty 90

## 2020-06-23 MED ORDER — CHLORHEXIDINE GLUCONATE 0.12 % MT SOLN
OROMUCOSAL | Status: AC
  Administered 2020-06-23: 15 mL
  Filled 2020-06-23: qty 15

## 2020-06-23 NOTE — Progress Notes (Signed)
Nutrition Follow-up  DOCUMENTATION CODES:   Morbid obesity  INTERVENTION:  If patient does not extubate, initiate new goal TF regimen of Vital AF 1.2 Cal at 40 mL/hr (960 mL goal daily volume) + PROsource TF 90 mL 5 times daily per tube. Provides 1552 kcal, 182 grams of protein, 778 mL H2O daily. With current propofol rate provides 1782 kcal daily.  Continue MVI daily per tube, thiamine 100 mg daily per tube, folic acid 1 mg daily per tube.  Patient remains at risk for refeeding syndrome. Recommend monitoring potassium, phosphorus, and magnesium daily, and to replete as needed.  NUTRITION DIAGNOSIS:   Inadequate oral intake related to inability to eat as evidenced by NPO status.  Ongoing.  GOAL:   Patient will meet greater than or equal to 90% of their needs  Met with TF regimen.  MONITOR:   Vent status, Labs, Weight trends, TF tolerance, I & O's  REASON FOR ASSESSMENT:   Ventilator, Consult Enteral/tube feeding initiation and management  ASSESSMENT:   39 year old male with PMHx of EtOH abuse, polysubstance abuse, hepatitis C admitted with AMS, acute encephalopathy, EtOH withdrawal, suspected aspiration PNA, AKI, sepsis.  11/16 intubated  Patient is currently intubated on ventilator support MV: 8.8 L/min Temp (24hrs), Avg:99.7 F (37.6 C), Min:98.2 F (36.8 C), Max:101.6 F (38.7 C)  Propofol: 8.71 ml/hr (230 kcal daily)  Medications reviewed and include: Colace 993 mg BID, folic acid 1 mg daily per tube, lactulose 20 grams BID per tube, Reglan 5 mg Q12hrs IV, MVI daily per tube, nicotine patch, Protonix, Miralax, thiamine 100 mg daily per tube, potassium chloride 40 mEq per tube x 2 today, Unasyn, Dilaudid gtt, Versed gtt, propofol gtt.  Labs reviewed: CBG 87-100, Potassium 3.1, Chloride 114, CO2 19, BUN 26, Creatinine 2.66, Magnesium 2.6.  I/O: 1170 mL UOP yesterday (0.3 mL/kg/hr)  Weight trend: 150.9 kg on 06/22/2020 and 154.6 kg on 06/23/2020  Enteral  Access: OGT; terminates in gastric antrum per abdominal x-ray 11/16  TF regimen: Vital AF 1.2 Cal at 20 mL/hr + PROSource TF 90 mL 6 times daily per tube  Discussed with RN and on rounds. Plan for possible extubation today.  Diet Order:   Diet Order            Diet NPO time specified  Diet effective now                EDUCATION NEEDS:   No education needs have been identified at this time  Skin:  Skin Assessment: Reviewed RN Assessment  Last BM:  06/23/2020 - large type 7  Height:   Ht Readings from Last 1 Encounters:  06/19/20 _0  (1.778 m)   Weight:   Wt Readings from Last 1 Encounters:  06/23/20 (!) 154.6 kg   Ideal Body Weight:  75.5 kg  BMI:  Body mass index is 48.9 kg/m.  Estimated Nutritional Needs:   Kcal:  7169-6789 (11-14 kcal/kg)  Protein:  up to 189 grams (2.5 grams/kg IBW)  Fluid:  >/= 2.2 L/day  Jacklynn Barnacle, MS, RD, LDN Pager number available on Amion

## 2020-06-23 NOTE — Progress Notes (Signed)
PHARMACY CONSULT NOTE - FOLLOW UP  Pharmacy Consult for Electrolyte Monitoring and Replacement   Recent Labs: Potassium (mmol/L)  Date Value  06/23/2020 3.1 (L)  02/12/2013 3.9   Magnesium (mg/dL)  Date Value  53/66/4403 2.6 (H)   Calcium (mg/dL)  Date Value  47/42/5956 7.4 (L)   Calcium, Total (mg/dL)  Date Value  38/75/6433 8.7   Albumin (g/dL)  Date Value  29/51/8841 2.5 (L)  02/12/2013 3.6   Phosphorus (mg/dL)  Date Value  66/01/3015 4.3   Sodium (mmol/L)  Date Value  06/23/2020 142  02/12/2013 139     Assessment: 39 year old male here with encephalopathy s/t alcohol withdrawal. Patient intubated and sedated, requiring Dilaudid, propofol and Versed to maintain sedation. Patient started on Librium and phenobarbital taper for alcohol withdrawal and to help facilitate weaning from vent. Pharmacy to manage electrolytes.  Goal of Therapy:  Electrolytes WNL  Plan:  Potassium 40 mEq per tube for 2 doses. Continue to follow along.  Pricilla Riffle ,PharmD Clinical Pharmacist 06/23/2020 1:34 PM

## 2020-06-23 NOTE — Progress Notes (Signed)
NAME:  Nicholas Valdez, MRN:  [id number], DOB:  [birth year], LOS: 4 ADMISSION DATE:  06/19/2020, CONSULTATION DATE:  06/20/2020 REFERRING MD:  Dr. Alanda Slim, CHIEF COMPLAINT:  Altered Mental Status   Brief History   39 yo male significant history of ETOH and polysubstance abuse presented to the ED with altered mental status admitted to the ICU requiring precedex drip and ultimately intubation & mechanical ventilation.  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 Despite decreasing versed to 7 and completely stopping Propofol patient is only partially arousable. Will continue to wean sedation to RASS-0 with plan for SBT.            Procedures:  06/20/20 ETT >> 06/20/20 CVC 3 L >>  Significant Diagnostic Tests:  06/19/20 CT head >> no acute intracranial abnormality 06/20/20 US abdomen >> mild hepatic steatosis 06/21/20 Echocardiogram >> LVEF 65-70%, no wall motion abnormalities, no other abnormalities noted Micro Data:  06/19/20 COVID-19 >> negative 06/19/20 Influenza PCR A/B >> negative 06/20/20 MRSA PCR >> negative 06/20/20 tracheal aspirate >>  Antimicrobials:  06/21/20 Unasyn >>  Interim history/subjective:  Patient intubated and sedated, overnight RASS: -5 no acute events. Plan today to aggressively titrate down on versed drip, fentanyl drip discontinued.  Labs/ Imaging personally reviewed Net: +800 mL (+3.6L since admit) Na+/ K+: 140/ 3.3-replaced overnight BUN/Cr.: 16/ 2.46- increased Hgb: 10.7 CK: 722 - increased  WBC/ TMAX: 11.8/ 37.9 Lactic/ PCT: 1.1/ 0.35  ABG: 7.32/ 30/ 125/ 15.5 CXR 06/21/20: Increased bibasilar infiltrates - 18:00  Objective   Blood pressure 138/82, pulse (!) 105, temperature (!) 100.6 F (38.1 C), temperature source Axillary, resp. rate 18, height 5\' 10"   (1.778 m), weight (!) 154.6 kg, SpO2 94 %.    Vent Mode: PRVC FiO2 (%):  [30 %-35 %] 35 % Set Rate:  [16 bmp] 16 bmp Vt Set:  [550 mL] 550 mL PEEP:  [5 cmH20] 5 cmH20 Plateau Pressure:  [17 cmH20] 17 cmH20   Intake/Output Summary (Last 24 hours) at 06/23/2020 1640 Last data filed at 06/23/2020 1400 Gross per 24 hour  Intake 2748.45 ml  Output 1520 ml  Net 1228.45 ml   Filed Weights   06/19/20 1954 06/22/20 0352 06/23/20 0451  Weight: (!) 145.2 kg (!) 150.9 kg (!) 154.6 kg    Examination: General: Adult male, critically ill, lying in bed intubated & sedated requiring mechanical ventilation  HEENT: MM pink/moist, anicteric, atraumatic, neck supple Neuro: sedated, RASS -5, unable to follow commands, PERRL +3, MAE CV: s1s2 RRR, NSR on monitor, no r/m/g Pulm: Regular, non labored on PCV: 40%/ PEEP 5, breath sounds rhonchi-BUL & diminished-BLL GI: soft, rounded, bs x 4 GU: foley in place with yellow urine sediment Skin: scattered ecchymosis no rashes/lesions noted Extremities: warm/dry, pulses + 2 R/P, no edema noted  Resolved Hospital Problem list     Assessment & Plan:  Acute Hypoxic Respiratory failure secondary to acute encephalopathy due to Alcohol withdrawal Suspected Aspiration Pneumonia Patient agitated due to ETOH withdrawal, obtunded with medication administration- unable to protect airway and intubated on 11/16. Concerns for aspiration PNA overnight due to copious secretions and increased FiO2 requirements, febrile 06/21/20 - Ventilator settings: PCV 8 mL/kg, 30% FiO2, 5 PEEP, continue ventilator support & lung protective strategies - Wean PEEP & FiO2 as tolerated, maintain SpO2 > 90% -  Head of bed elevated 30 degrees, VAP protocol in place - Plateau pressures less than 30 cm H20  - Intermittent chest x-ray & ABG PRN - Daily WUA with SBT as tolerated  - Ensure adequate pulmonary hygiene  - F/u cultures, trend PCT - PAD protocol in place: continue Dilaudid drip &  Propofol & Versed drip - plan to titrate down on propofol drip as tolerated, leaving the patient on dilaudid & versed - continue vecuronium IVP PRN to maintain ventilator synchrony - continue Unasyn for aspiration pneumonia coverage  Acute Encephalopathy due to ETOH withdrawal Hepatic Steatosis Polysubstance abuse Ammonia- trending down: 76 ~ 47 ~ 38 - continue lactulose - CIWA protocol - continue thiamine & folic acid - continue librium QID - trend ammonia - daily CMP - continue IVF resuscitation  Acute Kidney Injury  In the setting of sepsis. Baseline creatinine 0.88, creatinine on admission 1.54 this AM (11/17). Cr. Trending up 2.46/ serum CO2 16/ CK 722 - ordered sodium bicarb infusion for 12 h 150 meq at 75 mL/h - continue IVF hydration @ 50 mL/h - Strict I/O's: alert provider if UOP < 0.5 mL/kg/hr - Daily CMP, replace electrolytes PRN - Avoid nephrotoxic agents as able, ensure adequate renal perfusion  Sepsis with suspected Septic Shock due to suspected Aspiration Pneumonia Lactic: 3.3 ~ 2.5 ~ 2.1~1.1 PCT: 0.12~0.35 Increased secretions, FiO2 and pressor requirements overnight- 06/20/20 - Unasyn ordered per pharmacy for aspiration coverage - continue levophed drip, maintain MAP > 65 - continuous cardiac monitoring  Hypoalbuminia in the setting of ETOH abuse - continue trickle TF - continue reglan BID for suspected gastritis - Q 4 CBG  Best practice:  Diet: NPO Pain/Anxiety/Delirium protocol (if indicated): fentanyl & propofol & versed VAP protocol (if indicated): initiated DVT prophylaxis: enoxaparin SQ GI prophylaxis: protonix Glucose control: monitor Q 4 Mobility: bedrest, mobilize as tolerated Code Status: FULL Family Communication:left VM 11/17, will update 11/18 PRN Disposition: ICU  Labs   CBC: Recent Labs  Lab 06/19/20 1953 06/20/20 0348 06/21/20 0633 06/22/20 0508 06/23/20 0449  WBC 5.5 8.2 17.5* 11.8* 10.1  NEUTROABS  --  4.7 13.1* 8.9* 7.4   HGB 13.3 13.0 13.4 10.7* 9.7*  HCT 36.9* 37.4* 39.0 31.4* 27.4*  MCV 102.5* 105.1* 108.6* 107.5* 106.2*  PLT 287 229 316 186 176    Basic Metabolic Panel: Recent Labs  Lab 06/19/20 1953 06/19/20 2209 06/20/20 0348 06/21/20 0633 06/22/20 0508 06/23/20 0449  NA 138  --  141 136 140 142  K 3.5  --  3.8 4.4 3.3* 3.1*  CL 106  --  112* 106 112* 114*  CO2 21*  --  18* 20* 16* 19*  GLUCOSE 137*  --  95 133* 120* 106*  BUN 7  --  7 12 16  26*  CREATININE 0.80  --  0.88 1.54* 2.46* 2.66*  CALCIUM 7.8*  --  7.3* 7.2* 7.0* 7.4*  MG  --  1.9 1.9 2.0 1.8 2.6*  PHOS  --  4.7* 2.9 4.6 3.9 4.3   GFR: Estimated Creatinine Clearance: 57.3 mL/min (A) (by C-G formula based on SCr of 2.66 mg/dL (H)). Recent Labs  Lab 06/19/20 1953 06/19/20 2209 06/20/20 0348 06/20/20 1032 06/20/20 1325 06/21/20 0633 06/21/20 0941 06/22/20 0508 06/23/20 0449  PROCALCITON  --   --   --   --   --  0.12  --  0.35 0.45  WBC   < >  --  8.2  --   --  17.5*  --  11.8* 10.1  LATICACIDVEN  --  3.3*  --  2.5* 2.1*  --  1.1  --   --    < > = values in this interval not displayed.    Liver Function Tests: Recent Labs  Lab 06/19/20 1953 06/20/20 0348 06/22/20 0508 06/23/20 0449  AST 54* 62* 31 34  ALT 45* 46* 25 19  ALKPHOS 47 44 54 45  BILITOT 0.8 1.0 1.4* 1.0  PROT 7.1 6.7 5.9* 5.8*  ALBUMIN 3.6 3.4* 2.9* 2.5*   No results for input(s): LIPASE, AMYLASE in the last 168 hours. Recent Labs  Lab 06/19/20 2043 06/20/20 0348 06/20/20 1032 06/22/20 0508  AMMONIA 76* 47* 38* 41*    ABG    Component Value Date/Time   PHART 7.32 (L) 06/22/2020 0505   PCO2ART 30 (L) 06/22/2020 0505   PO2ART 125 (H) 06/22/2020 0505   HCO3 15.5 (L) 06/22/2020 0505   ACIDBASEDEF 9.4 (H) 06/22/2020 0505   O2SAT 98.6 06/22/2020 0505     Coagulation Profile: Recent Labs  Lab 06/19/20 2209  INR 1.0    Cardiac Enzymes: Recent Labs  Lab 06/19/20 2209 06/21/20 0633 06/22/20 0508 06/23/20 0449  CKTOTAL 138 107  722* 1,276*    HbA1C: No results found for: HGBA1C  CBG: Recent Labs  Lab 06/22/20 2342 06/23/20 0311 06/23/20 0738 06/23/20 1205 06/23/20 1611  GLUCAP 100* 87 92 96 97    Critical care time: 33 minutes      Critical care provider statement:    Critical care time (minutes):  33   Critical care time was exclusive of:  Separately billable procedures and  treating other patients   Critical care was necessary to treat or prevent imminent or  life-threatening deterioration of the following conditions:  Altered mental status with encephalopathy, possible sepsis, aspiration pneumonia, substance abuse , alcoholism   Critical care was time spent personally by me on the following  activities:  Development of treatment plan with patient or surrogate,  discussions with consultants, evaluation of patient's response to  treatment, examination of patient, obtaining history from patient or  surrogate, ordering and performing treatments and interventions, ordering  and review of laboratory studies and re-evaluation of patient's condition   I assumed direction of critical care for this patient from another  provider in my specialty: no       Vida Rigger, M.D.  Pulmonary & Critical Care Medicine  [facility]

## 2020-06-24 ENCOUNTER — Inpatient Hospital Stay: Payer: Medicaid Other

## 2020-06-24 ENCOUNTER — Encounter: Payer: Self-pay | Admitting: Internal Medicine

## 2020-06-24 LAB — CBC WITH DIFFERENTIAL/PLATELET
Abs Immature Granulocytes: 0.16 10*3/uL — ABNORMAL HIGH (ref 0.00–0.07)
Basophils Absolute: 0 10*3/uL (ref 0.0–0.1)
Basophils Relative: 0 %
Eosinophils Absolute: 0.2 10*3/uL (ref 0.0–0.5)
Eosinophils Relative: 2 %
HCT: 29.4 % — ABNORMAL LOW (ref 39.0–52.0)
Hemoglobin: 9.8 g/dL — ABNORMAL LOW (ref 13.0–17.0)
Immature Granulocytes: 1 %
Lymphocytes Relative: 7 %
Lymphs Abs: 0.8 10*3/uL (ref 0.7–4.0)
MCH: 36.7 pg — ABNORMAL HIGH (ref 26.0–34.0)
MCHC: 33.3 g/dL (ref 30.0–36.0)
MCV: 110.1 fL — ABNORMAL HIGH (ref 80.0–100.0)
Monocytes Absolute: 1.2 10*3/uL — ABNORMAL HIGH (ref 0.1–1.0)
Monocytes Relative: 10 %
Neutro Abs: 9.6 10*3/uL — ABNORMAL HIGH (ref 1.7–7.7)
Neutrophils Relative %: 80 %
Platelets: 196 10*3/uL (ref 150–400)
RBC: 2.67 MIL/uL — ABNORMAL LOW (ref 4.22–5.81)
RDW: 15.9 % — ABNORMAL HIGH (ref 11.5–15.5)
Smear Review: NORMAL
WBC: 12 10*3/uL — ABNORMAL HIGH (ref 4.0–10.5)
nRBC: 0.2 % (ref 0.0–0.2)

## 2020-06-24 LAB — COMPREHENSIVE METABOLIC PANEL
ALT: 20 U/L (ref 0–44)
AST: 32 U/L (ref 15–41)
Albumin: 2.5 g/dL — ABNORMAL LOW (ref 3.5–5.0)
Alkaline Phosphatase: 42 U/L (ref 38–126)
Anion gap: 7 (ref 5–15)
BUN: 35 mg/dL — ABNORMAL HIGH (ref 6–20)
CO2: 21 mmol/L — ABNORMAL LOW (ref 22–32)
Calcium: 7.9 mg/dL — ABNORMAL LOW (ref 8.9–10.3)
Chloride: 116 mmol/L — ABNORMAL HIGH (ref 98–111)
Creatinine, Ser: 2.68 mg/dL — ABNORMAL HIGH (ref 0.61–1.24)
GFR, Estimated: 31 mL/min — ABNORMAL LOW (ref >60)
Glucose, Bld: 117 mg/dL — ABNORMAL HIGH (ref 70–99)
Potassium: 4.4 mmol/L (ref 3.5–5.1)
Sodium: 144 mmol/L (ref 135–145)
Total Bilirubin: 0.8 mg/dL (ref 0.3–1.2)
Total Protein: 6.3 g/dL — ABNORMAL LOW (ref 6.5–8.1)

## 2020-06-24 LAB — CK: Total CK: 686 U/L — ABNORMAL HIGH (ref 49–397)

## 2020-06-24 LAB — PHOSPHORUS: Phosphorus: 5.7 mg/dL — ABNORMAL HIGH (ref 2.5–4.6)

## 2020-06-24 LAB — CULTURE, RESPIRATORY W GRAM STAIN: Culture: NO GROWTH

## 2020-06-24 LAB — GLUCOSE, CAPILLARY
Glucose-Capillary: 107 mg/dL — ABNORMAL HIGH (ref 70–99)
Glucose-Capillary: 108 mg/dL — ABNORMAL HIGH (ref 70–99)
Glucose-Capillary: 110 mg/dL — ABNORMAL HIGH (ref 70–99)
Glucose-Capillary: 113 mg/dL — ABNORMAL HIGH (ref 70–99)
Glucose-Capillary: 115 mg/dL — ABNORMAL HIGH (ref 70–99)
Glucose-Capillary: 120 mg/dL — ABNORMAL HIGH (ref 70–99)

## 2020-06-24 LAB — MAGNESIUM: Magnesium: 2.8 mg/dL — ABNORMAL HIGH (ref 1.7–2.4)

## 2020-06-24 MED ORDER — VANCOMYCIN HCL 1750 MG/350ML IV SOLN
1750.0000 mg | INTRAVENOUS | Status: DC
  Administered 2020-06-25 – 2020-06-27 (×2): 1750 mg via INTRAVENOUS
  Filled 2020-06-24 (×3): qty 350

## 2020-06-24 MED ORDER — VANCOMYCIN HCL 500 MG/100ML IV SOLN
500.0000 mg | Freq: Once | INTRAVENOUS | Status: AC
  Administered 2020-06-25: 500 mg via INTRAVENOUS
  Filled 2020-06-24: qty 100

## 2020-06-24 MED ORDER — FUROSEMIDE 10 MG/ML IJ SOLN
20.0000 mg | Freq: Once | INTRAMUSCULAR | Status: AC
  Administered 2020-06-25: 20 mg via INTRAVENOUS
  Filled 2020-06-24: qty 2

## 2020-06-24 MED ORDER — VANCOMYCIN HCL 2000 MG/400ML IV SOLN
2000.0000 mg | Freq: Once | INTRAVENOUS | Status: AC
  Administered 2020-06-25: 2000 mg via INTRAVENOUS
  Filled 2020-06-24: qty 400

## 2020-06-24 NOTE — Progress Notes (Signed)
Slowly weaned patient off sedation today. No response until 1700 when patient was repositioned he opened his eyes for a brief period and moved his left arm. He also shook his head in a back and fourth manner. Not following commands. Sedation and tube feeds remain off anticipating wake up/extubation.

## 2020-06-24 NOTE — Progress Notes (Signed)
NAME:  Nicholas Valdez, MRN:  [id number], DOB:  09-16-1980, LOS: 5 ADMISSION DATE:  06/19/2020, CONSULTATION DATE:  06/20/2020 REFERRING MD:  Dr. Alanda Slim, CHIEF COMPLAINT:  Altered Mental Status   Brief History   39 yo male significant history of ETOH and polysubstance abuse presented to the ED with altered mental status admitted to the ICU requiring precedex drip and ultimately intubation & mechanical ventilation.  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 Despite decreasing versed to 7 and completely stopping Propofol patient is only partially arousable. Will continue to wean sedation to RASS-0 with plan for SBT. 06/24/20- patient is being weaned off sedation as possible.            Procedures:  06/20/20 ETT >> 06/20/20 CVC 3 L >>  Significant Diagnostic Tests:  06/19/20 CT head >> no acute intracranial abnormality 06/20/20 US abdomen >> mild hepatic steatosis 06/21/20 Echocardiogram >> LVEF 65-70%, no wall motion abnormalities, no other abnormalities noted Micro Data:  06/19/20 COVID-19 >> negative 06/19/20 Influenza PCR A/B >> negative 06/20/20 MRSA PCR >> negative 06/20/20 tracheal aspirate >>  Antimicrobials:  06/21/20 Unasyn >>  Interim history/subjective:  Patient intubated and sedated, overnight RASS: -5 no acute events. Plan today to aggressively titrate down on versed drip, fentanyl drip discontinued.  Labs/ Imaging personally reviewed Net: +800 mL (+3.6L since admit) Na+/ K+: 140/ 3.3-replaced overnight BUN/Cr.: 16/ 2.46- increased Hgb: 10.7 CK: 722 - increased  WBC/ TMAX: 11.8/ 37.9 Lactic/ PCT: 1.1/ 0.35  ABG: 7.32/ 30/ 125/ 15.5 CXR 06/21/20: Increased bibasilar infiltrates - 18:00  Objective   Blood pressure 136/73, pulse 100, temperature 98.8 F (37.1 C),  temperature source Axillary, resp. rate 17, height 5\' 10"  (1.778 m), weight (!) 155.6 kg, SpO2 95 %.    Vent Mode: PSV FiO2 (%):  [28 %-35 %] 28 % Set Rate:  [16 bmp] 16 bmp Vt Set:  [550 mL] 550 mL PEEP:  [5 cmH20] 5 cmH20 Pressure Support:  [5 cmH20] 5 cmH20 Plateau Pressure:  [15 cmH20-17 cmH20] 15 cmH20   Intake/Output Summary (Last 24 hours) at 06/24/2020 1655 Last data filed at 06/24/2020 1600 Gross per 24 hour  Intake 891.39 ml  Output 1925 ml  Net -1033.61 ml   Filed Weights   06/22/20 0352 06/23/20 0451 06/24/20 0500  Weight: (!) 150.9 kg (!) 154.6 kg (!) 155.6 kg    Examination: General: Adult male, critically ill, lying in bed intubated & sedated requiring mechanical ventilation  HEENT: MM pink/moist, anicteric, atraumatic, neck supple Neuro: sedated, RASS -5, unable to follow commands, PERRL +3, MAE CV: s1s2 RRR, NSR on monitor, no r/m/g Pulm: Regular, non labored on PCV: 40%/ PEEP 5, breath sounds rhonchi-BUL & diminished-BLL GI: soft, rounded, bs x 4 GU: foley in place with yellow urine sediment Skin: scattered ecchymosis no rashes/lesions noted Extremities: warm/dry, pulses + 2 R/P, no edema noted  Resolved Hospital Problem list     Assessment & Plan:  Acute Hypoxic Respiratory failure secondary to acute encephalopathy due to Alcohol withdrawal Suspected Aspiration Pneumonia Patient agitated due to ETOH withdrawal, obtunded with medication administration- unable to protect airway and intubated on 11/16. Concerns for aspiration PNA overnight due to copious secretions and increased FiO2 requirements, febrile 06/21/20 - Ventilator settings: PCV 8 mL/kg, 30% FiO2, 5 PEEP, continue ventilator support &  lung protective strategies - Wean PEEP & FiO2 as tolerated, maintain SpO2 > 90% - Head of bed elevated 30 degrees, VAP protocol in place - Plateau pressures less than 30 cm H20  - Intermittent chest x-ray & ABG PRN - Daily WUA with SBT as tolerated  - Ensure  adequate pulmonary hygiene  - F/u cultures, trend PCT - PAD protocol in place: continue Dilaudid drip & Propofol & Versed drip - plan to titrate down on propofol drip as tolerated, leaving the patient on dilaudid & versed - continue vecuronium IVP PRN to maintain ventilator synchrony - continue Unasyn for aspiration pneumonia coverage  Acute Encephalopathy due to ETOH withdrawal Hepatic Steatosis Polysubstance abuse Ammonia- trending down: 76 ~ 47 ~ 38 - continue lactulose - CIWA protocol - continue thiamine & folic acid - continue librium QID - trend ammonia - daily CMP - continue IVF resuscitation  Acute Kidney Injury  In the setting of sepsis. Baseline creatinine 0.88, creatinine on admission 1.54 this AM (11/17). Cr. Trending up 2.46/ serum CO2 16/ CK 722 - ordered sodium bicarb infusion for 12 h 150 meq at 75 mL/h - continue IVF hydration @ 50 mL/h - Strict I/O's: alert provider if UOP < 0.5 mL/kg/hr - Daily CMP, replace electrolytes PRN - Avoid nephrotoxic agents as able, ensure adequate renal perfusion  Sepsis with suspected Septic Shock due to suspected Aspiration Pneumonia Lactic: 3.3 ~ 2.5 ~ 2.1~1.1 PCT: 0.12~0.35 Increased secretions, FiO2 and pressor requirements overnight- 06/20/20 - Unasyn ordered per pharmacy for aspiration coverage - continue levophed drip, maintain MAP > 65 - continuous cardiac monitoring  Hypoalbuminia in the setting of ETOH abuse - continue trickle TF - continue reglan BID for suspected gastritis - Q 4 CBG  Best practice:  Diet: NPO Pain/Anxiety/Delirium protocol (if indicated): fentanyl & propofol & versed VAP protocol (if indicated): initiated DVT prophylaxis: enoxaparin SQ GI prophylaxis: protonix Glucose control: monitor Q 4 Mobility: bedrest, mobilize as tolerated Code Status: FULL Family Communication:left VM 11/17, will update 11/18 PRN Disposition: ICU  Labs   CBC: Recent Labs  Lab 06/20/20 0348 06/21/20 0633  06/22/20 0508 06/23/20 0449 06/24/20 0440  WBC 8.2 17.5* 11.8* 10.1 12.0*  NEUTROABS 4.7 13.1* 8.9* 7.4 9.6*  HGB 13.0 13.4 10.7* 9.7* 9.8*  HCT 37.4* 39.0 31.4* 27.4* 29.4*  MCV 105.1* 108.6* 107.5* 106.2* 110.1*  PLT 229 316 186 176 196    Basic Metabolic Panel: Recent Labs  Lab 06/20/20 0348 06/21/20 0633 06/22/20 0508 06/23/20 0449 06/24/20 0440  NA 141 136 140 142 144  K 3.8 4.4 3.3* 3.1* 4.4  CL 112* 106 112* 114* 116*  CO2 18* 20* 16* 19* 21*  GLUCOSE 95 133* 120* 106* 117*  BUN 7 12 16  26* 35*  CREATININE 0.88 1.54* 2.46* 2.66* 2.68*  CALCIUM 7.3* 7.2* 7.0* 7.4* 7.9*  MG 1.9 2.0 1.8 2.6* 2.8*  PHOS 2.9 4.6 3.9 4.3 5.7*   GFR: Estimated Creatinine Clearance: 57.1 mL/min (A) (by C-G formula based on SCr of 2.68 mg/dL (H)). Recent Labs  Lab 06/19/20 2209 06/20/20 0348 06/20/20 1032 06/20/20 1325 06/21/20 0633 06/21/20 0941 06/22/20 0508 06/23/20 0449 06/24/20 0440  PROCALCITON  --   --   --   --  0.12  --  0.35 0.45  --   WBC  --    < >  --   --  17.5*  --  11.8* 10.1 12.0*  LATICACIDVEN 3.3*  --  2.5* 2.1*  --  1.1  --   --   --    < > =  values in this interval not displayed.    Liver Function Tests: Recent Labs  Lab 06/19/20 1953 06/20/20 0348 06/22/20 0508 06/23/20 0449 06/24/20 0440  AST 54* 62* 31 34 32  ALT 45* 46* 25 19 20   ALKPHOS 47 44 54 45 42  BILITOT 0.8 1.0 1.4* 1.0 0.8  PROT 7.1 6.7 5.9* 5.8* 6.3*  ALBUMIN 3.6 3.4* 2.9* 2.5* 2.5*   No results for input(s): LIPASE, AMYLASE in the last 168 hours. Recent Labs  Lab 06/19/20 2043 06/20/20 0348 06/20/20 1032 06/22/20 0508  AMMONIA 76* 47* 38* 41*    ABG    Component Value Date/Time   PHART 7.32 (L) 06/22/2020 0505   PCO2ART 30 (L) 06/22/2020 0505   PO2ART 125 (H) 06/22/2020 0505   HCO3 15.5 (L) 06/22/2020 0505   ACIDBASEDEF 9.4 (H) 06/22/2020 0505   O2SAT 98.6 06/22/2020 0505     Coagulation Profile: Recent Labs  Lab 06/19/20 2209  INR 1.0    Cardiac  Enzymes: Recent Labs  Lab 06/19/20 2209 06/21/20 0633 06/22/20 0508 06/23/20 0449 06/24/20 0440  CKTOTAL 138 107 722* 1,276* 686*    HbA1C: No results found for: HGBA1C  CBG: Recent Labs  Lab 06/24/20 0007 06/24/20 0420 06/24/20 0801 06/24/20 1120 06/24/20 1529  GLUCAP 115* 108* 107* 113* 120*    Critical care time: 33 minutes      Critical care provider statement:    Critical care time (minutes):  33   Critical care time was exclusive of:  Separately billable procedures and  treating other patients   Critical care was necessary to treat or prevent imminent or  life-threatening deterioration of the following conditions:  Altered mental status with encephalopathy, possible sepsis, aspiration pneumonia, substance abuse , alcoholism   Critical care was time spent personally by me on the following  activities:  Development of treatment plan with patient or surrogate,  discussions with consultants, evaluation of patient's response to  treatment, examination of patient, obtaining history from patient or  surrogate, ordering and performing treatments and interventions, ordering  and review of laboratory studies and re-evaluation of patient's condition   I assumed direction of critical care for this patient from another  provider in my specialty: no       06/26/20, M.D.  Pulmonary & Critical Care Medicine  [facility]

## 2020-06-24 NOTE — Progress Notes (Signed)
Pharmacy Antibiotic Note  Nicholas Valdez is a 39 y.o. male admitted on 06/19/2020. Patient with fever and increase in WBC. Trach aspirate collected. Pharmacy has been consulted for Unasyn dosing.  Plan: Day 4 of abx. Unasyn 3 g IV q6h  Height: 5\' 10"  (177.8 cm) Weight: (!) 155.6 kg (343 lb 0.6 oz) IBW/kg (Calculated) : 73  Temp (24hrs), Avg:99.9 F (37.7 C), Min:98.2 F (36.8 C), Max:101 F (38.3 C)  Recent Labs  Lab 06/19/20 1953 06/19/20 2209 06/20/20 0348 06/20/20 1032 06/20/20 1325 06/21/20 0633 06/21/20 0941 06/22/20 0508 06/23/20 0449 06/24/20 0440  WBC   < >  --  8.2  --   --  17.5*  --  11.8* 10.1 12.0*  CREATININE   < >  --  0.88  --   --  1.54*  --  2.46* 2.66* 2.68*  LATICACIDVEN  --  3.3*  --  2.5* 2.1*  --  1.1  --   --   --    < > = values in this interval not displayed.    Estimated Creatinine Clearance: 57.1 mL/min (A) (by C-G formula based on SCr of 2.68 mg/dL (H)).    Allergies  Allergen Reactions  . Ondansetron Hives  . Promethazine Hives  . Penicillins Hives    Did it involve swelling of the face/tongue/throat, SOB, or low BP? Yes Did it involve sudden or severe rash/hives, skin peeling, or any reaction on the inside of your mouth or nose? Yes Did you need to seek medical attention at a hospital or doctor's office? Yes When did it last happen?childhood If all above answers are "NO", may proceed with cephalosporin use.  06/26/20 Zofran [Ondansetron Hcl] Hives    Antimicrobials this admission: Unasyn 11/17 >>   Microbiology results: 11/17 Sputum: pending  11/16 MRSA PCR: negative  Thank you for allowing pharmacy to be a part of this patient's care.  12/16, PharmD 06/24/2020 7:29 AM

## 2020-06-24 NOTE — Progress Notes (Signed)
Restarted versed and dilaudid drips, so that patient would be more compliant with vent, and not start pulling tubes out.

## 2020-06-24 NOTE — Progress Notes (Signed)
Pharmacy Antibiotic Note  Nicholas Valdez is a 39 y.o. male admitted on 06/19/2020 with pneumonia.  Pharmacy has been consulted for vancomycin  Dosing.  TBW = 155.6 kg  CrCl = 57.1 ml/min   Plan: Vancomycin 2500 mg IV X 1 ordered to be given on 11/21 @ ~ 0000. Vancomycin 1750 mg IV Q24H ordered to start on 11/22 @ ~ 0000.  Height: 5\' 10"  (177.8 cm) Weight: (!) 155.6 kg (343 lb 0.6 oz) IBW/kg (Calculated) : 73  Temp (24hrs), Avg:101.6 F (38.7 C), Min:98.8 F (37.1 C), Max:103.3 F (39.6 C)  Recent Labs  Lab 06/19/20 1953 06/19/20 2209 06/20/20 0348 06/20/20 1032 06/20/20 1325 06/21/20 0633 06/21/20 0941 06/22/20 0508 06/23/20 0449 06/24/20 0440  WBC   < >  --  8.2  --   --  17.5*  --  11.8* 10.1 12.0*  CREATININE   < >  --  0.88  --   --  1.54*  --  2.46* 2.66* 2.68*  LATICACIDVEN  --  3.3*  --  2.5* 2.1*  --  1.1  --   --   --    < > = values in this interval not displayed.    Estimated Creatinine Clearance: 57.1 mL/min (A) (by C-G formula based on SCr of 2.68 mg/dL (H)).    Allergies  Allergen Reactions  . Ondansetron Hives  . Promethazine Hives  . Penicillins Hives    Did it involve swelling of the face/tongue/throat, SOB, or low BP? Yes Did it involve sudden or severe rash/hives, skin peeling, or any reaction on the inside of your mouth or nose? Yes Did you need to seek medical attention at a hospital or doctor's office? Yes When did it last happen?childhood If all above answers are "NO", may proceed with cephalosporin use.  06/26/20 Zofran [Ondansetron Hcl] Hives    Antimicrobials this admission:   >>    >>   Dose adjustments this admission:   Microbiology results:  BCx:   UCx:    Sputum:    MRSA PCR:   Thank you for allowing pharmacy to be a part of this patient's care.  Malaijah Houchen D 06/24/2020 11:41 PM

## 2020-06-24 NOTE — Progress Notes (Signed)
Temperature up, placed ice packs underarms and in groin. Also provided tylenol.

## 2020-06-24 NOTE — Progress Notes (Signed)
PHARMACY CONSULT NOTE - FOLLOW UP  Pharmacy Consult for Electrolyte Monitoring and Replacement   Recent Labs: Potassium (mmol/L)  Date Value  06/24/2020 4.4  02/12/2013 3.9   Magnesium (mg/dL)  Date Value  86/57/8469 2.8 (H)   Calcium (mg/dL)  Date Value  62/95/2841 7.9 (L)   Calcium, Total (mg/dL)  Date Value  32/44/0102 8.7   Albumin (g/dL)  Date Value  72/53/6644 2.5 (L)  02/12/2013 3.6   Phosphorus (mg/dL)  Date Value  03/47/4259 5.7 (H)   Sodium (mmol/L)  Date Value  06/24/2020 144  02/12/2013 139     Assessment: 39 year old male here with encephalopathy s/t alcohol withdrawal. Patient intubated and sedated, requiring Dilaudid, propofol and Versed to maintain sedation. Patient started on Librium and phenobarbital taper for alcohol withdrawal and to help facilitate weaning from vent. Pharmacy to manage electrolytes.  Goal of Therapy:  Electrolytes WNL  Plan:  No replacement needed today.   Ronnald Ramp ,PharmD Clinical Pharmacist 06/24/2020 7:27 AM

## 2020-06-25 ENCOUNTER — Inpatient Hospital Stay: Payer: Medicaid Other

## 2020-06-25 ENCOUNTER — Inpatient Hospital Stay: Payer: Self-pay

## 2020-06-25 LAB — BRAIN NATRIURETIC PEPTIDE: B Natriuretic Peptide: 494.9 pg/mL — ABNORMAL HIGH (ref 0.0–100.0)

## 2020-06-25 LAB — CBC WITH DIFFERENTIAL/PLATELET
Abs Immature Granulocytes: 0.2 10*3/uL — ABNORMAL HIGH (ref 0.00–0.07)
Abs Immature Granulocytes: 0.26 10*3/uL — ABNORMAL HIGH (ref 0.00–0.07)
Basophils Absolute: 0 10*3/uL (ref 0.0–0.1)
Basophils Absolute: 0 10*3/uL (ref 0.0–0.1)
Basophils Relative: 0 %
Basophils Relative: 0 %
Eosinophils Absolute: 0 10*3/uL (ref 0.0–0.5)
Eosinophils Absolute: 0 10*3/uL (ref 0.0–0.5)
Eosinophils Relative: 0 %
Eosinophils Relative: 0 %
HCT: 29.2 % — ABNORMAL LOW (ref 39.0–52.0)
HCT: 28.7 % — ABNORMAL LOW (ref 39.0–52.0)
Hemoglobin: 9.4 g/dL — ABNORMAL LOW (ref 13.0–17.0)
Hemoglobin: 9.1 g/dL — ABNORMAL LOW (ref 13.0–17.0)
Immature Granulocytes: 2 %
Immature Granulocytes: 3 %
Lymphocytes Relative: 6 %
Lymphocytes Relative: 12 %
Lymphs Abs: 0.7 10*3/uL (ref 0.7–4.0)
Lymphs Abs: 1.2 10*3/uL (ref 0.7–4.0)
MCH: 36.3 pg — ABNORMAL HIGH (ref 26.0–34.0)
MCH: 36.5 pg — ABNORMAL HIGH (ref 26.0–34.0)
MCHC: 32.2 g/dL (ref 30.0–36.0)
MCHC: 31.7 g/dL (ref 30.0–36.0)
MCV: 112.7 fL — ABNORMAL HIGH (ref 80.0–100.0)
MCV: 115.3 fL — ABNORMAL HIGH (ref 80.0–100.0)
Monocytes Absolute: 1.3 10*3/uL — ABNORMAL HIGH (ref 0.1–1.0)
Monocytes Absolute: 1.3 10*3/uL — ABNORMAL HIGH (ref 0.1–1.0)
Monocytes Relative: 12 %
Monocytes Relative: 14 %
Neutro Abs: 8.8 10*3/uL — ABNORMAL HIGH (ref 1.7–7.7)
Neutro Abs: 6.9 10*3/uL (ref 1.7–7.7)
Neutrophils Relative %: 80 %
Neutrophils Relative %: 71 %
Platelets: 212 10*3/uL (ref 150–400)
Platelets: 212 10*3/uL (ref 150–400)
RBC: 2.59 MIL/uL — ABNORMAL LOW (ref 4.22–5.81)
RBC: 2.49 MIL/uL — ABNORMAL LOW (ref 4.22–5.81)
RDW: 16 % — ABNORMAL HIGH (ref 11.5–15.5)
RDW: 15.7 % — ABNORMAL HIGH (ref 11.5–15.5)
Smear Review: ADEQUATE
Smear Review: NORMAL
WBC: 11 10*3/uL — ABNORMAL HIGH (ref 4.0–10.5)
WBC: 9.8 10*3/uL (ref 4.0–10.5)
nRBC: 0.2 % (ref 0.0–0.2)
nRBC: 0.4 % — ABNORMAL HIGH (ref 0.0–0.2)

## 2020-06-25 LAB — COMPREHENSIVE METABOLIC PANEL
ALT: 15 U/L (ref 0–44)
AST: 26 U/L (ref 15–41)
Albumin: 2.5 g/dL — ABNORMAL LOW (ref 3.5–5.0)
Alkaline Phosphatase: 36 U/L — ABNORMAL LOW (ref 38–126)
Anion gap: 6 (ref 5–15)
BUN: 45 mg/dL — ABNORMAL HIGH (ref 6–20)
CO2: 21 mmol/L — ABNORMAL LOW (ref 22–32)
Calcium: 8.3 mg/dL — ABNORMAL LOW (ref 8.9–10.3)
Chloride: 118 mmol/L — ABNORMAL HIGH (ref 98–111)
Creatinine, Ser: 2.97 mg/dL — ABNORMAL HIGH (ref 0.61–1.24)
GFR, Estimated: 27 mL/min — ABNORMAL LOW (ref >60)
Glucose, Bld: 120 mg/dL — ABNORMAL HIGH (ref 70–99)
Potassium: 4.2 mmol/L (ref 3.5–5.1)
Sodium: 145 mmol/L (ref 135–145)
Total Bilirubin: 0.8 mg/dL (ref 0.3–1.2)
Total Protein: 6.5 g/dL (ref 6.5–8.1)

## 2020-06-25 LAB — MAGNESIUM: Magnesium: 2.9 mg/dL — ABNORMAL HIGH (ref 1.7–2.4)

## 2020-06-25 LAB — GLUCOSE, CAPILLARY
Glucose-Capillary: 104 mg/dL — ABNORMAL HIGH (ref 70–99)
Glucose-Capillary: 109 mg/dL — ABNORMAL HIGH (ref 70–99)
Glucose-Capillary: 109 mg/dL — ABNORMAL HIGH (ref 70–99)
Glucose-Capillary: 113 mg/dL — ABNORMAL HIGH (ref 70–99)
Glucose-Capillary: 123 mg/dL — ABNORMAL HIGH (ref 70–99)
Glucose-Capillary: 124 mg/dL — ABNORMAL HIGH (ref 70–99)
Glucose-Capillary: 97 mg/dL (ref 70–99)

## 2020-06-25 LAB — PHOSPHORUS: Phosphorus: 5.7 mg/dL — ABNORMAL HIGH (ref 2.5–4.6)

## 2020-06-25 LAB — TRIGLYCERIDES: Triglycerides: 278 mg/dL — ABNORMAL HIGH (ref <150)

## 2020-06-25 LAB — PROCALCITONIN: Procalcitonin: 0.35 ng/mL

## 2020-06-25 MED ORDER — DEXMEDETOMIDINE HCL IN NACL 400 MCG/100ML IV SOLN
0.4000 ug/kg/h | INTRAVENOUS | Status: DC
  Administered 2020-06-27: 1.2 ug/kg/h via INTRAVENOUS
  Administered 2020-06-27 – 2020-06-28 (×6): 0.8 ug/kg/h via INTRAVENOUS
  Administered 2020-06-28: 1.2 ug/kg/h via INTRAVENOUS
  Administered 2020-06-28 (×3): 0.8 ug/kg/h via INTRAVENOUS
  Administered 2020-06-28 – 2020-06-30 (×19): 1.2 ug/kg/h via INTRAVENOUS
  Administered 2020-06-30: 1 ug/kg/h via INTRAVENOUS
  Administered 2020-06-30 – 2020-07-01 (×6): 1.2 ug/kg/h via INTRAVENOUS
  Administered 2020-07-01 (×4): 1 ug/kg/h via INTRAVENOUS
  Administered 2020-07-01: 1.2 ug/kg/h via INTRAVENOUS
  Administered 2020-07-01: 1 ug/kg/h via INTRAVENOUS
  Administered 2020-07-02 (×6): 1.2 ug/kg/h via INTRAVENOUS
  Administered 2020-07-02: 0.8 ug/kg/h via INTRAVENOUS
  Administered 2020-07-02 – 2020-07-04 (×24): 1.2 ug/kg/h via INTRAVENOUS
  Administered 2020-07-05: 1 ug/kg/h via INTRAVENOUS
  Administered 2020-07-05: 1.1 ug/kg/h via INTRAVENOUS
  Administered 2020-07-05 (×3): 1.2 ug/kg/h via INTRAVENOUS
  Administered 2020-07-05: 1 ug/kg/h via INTRAVENOUS
  Administered 2020-07-05: 1.2 ug/kg/h via INTRAVENOUS
  Administered 2020-07-05 (×2): 1 ug/kg/h via INTRAVENOUS
  Administered 2020-07-05: 1.1 ug/kg/h via INTRAVENOUS
  Administered 2020-07-06 (×2): 1.2 ug/kg/h via INTRAVENOUS
  Administered 2020-07-06: 1.1 ug/kg/h via INTRAVENOUS
  Administered 2020-07-06: 0.8 ug/kg/h via INTRAVENOUS
  Administered 2020-07-06: 1.2 ug/kg/h via INTRAVENOUS
  Administered 2020-07-06: 0.5 ug/kg/h via INTRAVENOUS
  Administered 2020-07-06: 1.2 ug/kg/h via INTRAVENOUS
  Administered 2020-07-06: 1 ug/kg/h via INTRAVENOUS
  Administered 2020-07-07: 0.4 ug/kg/h via INTRAVENOUS
  Filled 2020-06-25 (×97): qty 100

## 2020-06-25 NOTE — Progress Notes (Addendum)
Spoke with Asher Muir RN re PICC order.  Asher Muir to place PICC order. Asher Muir RN states concern re contamination of CVC from feeding with site reddened and need to remove.  Notified plan to place PICC line this pm.

## 2020-06-25 NOTE — Progress Notes (Signed)
Secure chat with Dr Karna Christmas re PICC order.  Pt febrile overnight tmax 103.3, CVC per previous nursing note.  Recommendation made to remove CVC and have 48 hour line holiday before placing PICC line.  Dr Karna Christmas to cancel PICC order.

## 2020-06-25 NOTE — Progress Notes (Signed)
Advanced OG tube to 55 cm per nursing communication order.

## 2020-06-25 NOTE — Progress Notes (Signed)
1500 Updated mother on patients status. Informed there is no change in patient status.

## 2020-06-25 NOTE — Progress Notes (Addendum)
Pharmacy Antibiotic Note  Nicholas Valdez is a 39 y.o. male admitted on 06/19/2020. Patient with fever and increase in WBC. Trach aspirate collected. Pharmacy has been consulted for Unasyn dosing.  Plan: Day 5 of abx. Unasyn 3 g IV q6h  Pt received vancomycin 2500 mg loading dose. Will order a maintenance dose of 1500 mg q24H - Scr is trending up. Predicted trough of 16. Pt has a high BMI which increases risk of accumulation. Goal trough of 15-20. Plan to order vancomycin levels in 4-5 days. Pt was spiking fevers and once started on vancomycin the fevers resolved. MRSA PCR is negative. Resp cx is still pending. De-escalate once once cultures return.   Height: 5\' 10"  (177.8 cm) Weight: (!) 155.6 kg (343 lb 0.6 oz) IBW/kg (Calculated) : 73  Temp (24hrs), Avg:101.2 F (38.4 C), Min:98.8 F (37.1 C), Max:103.3 F (39.6 C)  Recent Labs  Lab 06/19/20 2209 06/20/20 0348 06/20/20 1032 06/20/20 1325 06/21/20 06/23/20 06/21/20 06/23/20 06/21/20 0941 06/22/20 0508 06/23/20 0449 06/24/20 0440 06/25/20 0047 06/25/20 0514  WBC  --    < >  --   --  17.5*   < >  --  11.8* 10.1 12.0* 11.0* 9.8  CREATININE  --    < >  --   --  1.54*  --   --  2.46* 2.66* 2.68*  --  2.97*  LATICACIDVEN 3.3*  --  2.5* 2.1*  --   --  1.1  --   --   --   --   --    < > = values in this interval not displayed.    Estimated Creatinine Clearance: 51.6 mL/min (A) (by C-G formula based on SCr of 2.97 mg/dL (H)).    Allergies  Allergen Reactions  . Ondansetron Hives  . Promethazine Hives  . Penicillins Hives    Did it involve swelling of the face/tongue/throat, SOB, or low BP? Yes Did it involve sudden or severe rash/hives, skin peeling, or any reaction on the inside of your mouth or nose? Yes Did you need to seek medical attention at a hospital or doctor's office? Yes When did it last happen?childhood If all above answers are "NO", may proceed with cephalosporin use.  06/27/20 Zofran [Ondansetron Hcl] Hives     Antimicrobials this admission: Unasyn 11/17 >> Vancomycin 11/20 >>   Microbiology results: 11/17 Sputum: pending  11/16 MRSA PCR: negative  Thank you for allowing pharmacy to be a part of this patient's care.  12/16, PharmD, BCPS 06/25/2020 7:58 AM

## 2020-06-25 NOTE — Progress Notes (Signed)
NAME:  Nicholas Valdez, MRN:  [id number], DOB:  [birth year], LOS: 6 ADMISSION DATE:  06/19/2020, CONSULTATION DATE:  06/20/2020 REFERRING MD:  Dr. Alanda Slim, CHIEF COMPLAINT:  Altered Mental Status   Brief History   39 yo male significant history of ETOH and polysubstance abuse presented to the ED with altered mental status admitted to the ICU requiring precedex drip and ultimately intubation & mechanical ventilation.  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 Despite decreasing versed to 7 and completely stopping Propofol patient is only partially arousable. Will continue to wean sedation to RASS-0 with plan for SBT. 06/24/20- patient is being weaned off sedation as possible.            Procedures:  06/20/20 ETT >> 06/20/20 CVC 3 L >>  Significant Diagnostic Tests:  06/19/20 CT head >> no acute intracranial abnormality 06/20/20 US abdomen >> mild hepatic steatosis 06/21/20 Echocardiogram >> LVEF 65-70%, no wall motion abnormalities, no other abnormalities noted Micro Data:  06/19/20 COVID-19 >> negative 06/19/20 Influenza PCR A/B >> negative 06/20/20 MRSA PCR >> negative 06/20/20 tracheal aspirate >>  Antimicrobials:  06/21/20 Unasyn >>  Interim history/subjective:  Patient intubated and sedated, overnight RASS: -5 no acute events. Plan today to aggressively titrate down on versed drip, fentanyl drip discontinued.  Labs/ Imaging personally reviewed Net: +800 mL (+3.6L since admit) Na+/ K+: 140/ 3.3-replaced overnight BUN/Cr.: 16/ 2.46- increased Hgb: 10.7 CK: 722 - increased  WBC/ TMAX: 11.8/ 37.9 Lactic/ PCT: 1.1/ 0.35  ABG: 7.32/ 30/ 125/ 15.5 CXR 06/21/20: Increased bibasilar infiltrates - 18:00  Objective   Blood pressure 102/63, pulse 79, temperature 97.7 F (36.5 C), resp. rate  16, height 5\' 10"  (1.778 m), weight (!) 155.6 kg, SpO2 99 %.    Vent Mode: PRVC FiO2 (%):  [28 %-100 %] 40 % Set Rate:  [16 bmp] 16 bmp Vt Set:  [550 mL] 550 mL PEEP:  [5 cmH20-10 cmH20] 5 cmH20 Pressure Support:  [5 cmH20] 5 cmH20 Plateau Pressure:  [16 cmH20-22 cmH20] 22 cmH20   Intake/Output Summary (Last 24 hours) at 06/25/2020 1052 Last data filed at 06/25/2020 1000 Gross per 24 hour  Intake 1058.93 ml  Output 2140 ml  Net -1081.07 ml   Filed Weights   06/22/20 0352 06/23/20 0451 06/24/20 0500  Weight: (!) 150.9 kg (!) 154.6 kg (!) 155.6 kg    Examination: General: Adult male, critically ill, lying in bed intubated & sedated requiring mechanical ventilation  HEENT: MM pink/moist, anicteric, atraumatic, neck supple Neuro: sedated, RASS -5, unable to follow commands, PERRL +3, MAE CV: s1s2 RRR, NSR on monitor, no r/m/g Pulm: Regular, non labored on PCV: 40%/ PEEP 5, breath sounds rhonchi-BUL & diminished-BLL GI: soft, rounded, bs x 4 GU: foley in place with yellow urine sediment Skin: scattered ecchymosis no rashes/lesions noted Extremities: warm/dry, pulses + 2 R/P, no edema noted  Resolved Hospital Problem list     Assessment & Plan:  Acute Hypoxic Respiratory failure secondary to acute encephalopathy due to Alcohol withdrawal Suspected Aspiration Pneumonia Patient agitated due to ETOH withdrawal, obtunded with medication administration- unable to protect airway and intubated on 11/16. Concerns for aspiration PNA overnight due to copious secretions and increased FiO2 requirements, febrile 06/21/20 - Ventilator settings: PCV 8 mL/kg, 30% FiO2, 5 PEEP, continue ventilator support & lung  protective strategies - Wean PEEP & FiO2 as tolerated, maintain SpO2 > 90% - Head of bed elevated 30 degrees, VAP protocol in place - Plateau pressures less than 30 cm H20  - Intermittent chest x-ray & ABG PRN - Daily WUA with SBT as tolerated  - Ensure adequate pulmonary hygiene  -  F/u cultures, trend PCT - PAD protocol in place: continue Dilaudid drip & Propofol & Versed drip - plan to titrate down on propofol drip as tolerated, leaving the patient on dilaudid & versed - continue Unasyn +vanc for aspiration pneumonia coverage  Acute Encephalopathy due to ETOH withdrawal Hepatic Steatosis Polysubstance abuse Ammonia- trending down: 76 ~ 47 ~ 38 - continue lactulose - CIWA protocol - continue thiamine & folic acid - continue librium QID - trend ammonia - daily CMP - continue IVF resuscitation  Acute Kidney Injury  In the setting of sepsis. Baseline creatinine 0.88, creatinine on admission 1.54 this AM (11/17). Cr. Trending up 2.46/ serum CO2 16/ CK 722 - ordered sodium bicarb infusion for 12 h 150 meq at 75 mL/h - continue IVF hydration @ 50 mL/h - Strict I/O's: alert provider if UOP < 0.5 mL/kg/hr - Daily CMP, replace electrolytes PRN - Avoid nephrotoxic agents as able, ensure adequate renal perfusion  Sepsis with suspected Septic Shock due to suspected Aspiration Pneumonia Lactic: 3.3 ~ 2.5 ~ 2.1~1.1 PCT: 0.12~0.35 Increased secretions, FiO2 and pressor requirements overnight- 06/20/20 - Unasyn ordered per pharmacy for aspiration coverage - continue levophed drip, maintain MAP > 65 - continuous cardiac monitoring  Hypoalbuminia in the setting of ETOH abuse - continue trickle TF - continue reglan BID for suspected gastritis - Q 4 CBG  Best practice:  Diet: NPO Pain/Anxiety/Delirium protocol (if indicated): fentanyl & propofol & versed VAP protocol (if indicated): initiated DVT prophylaxis: enoxaparin SQ GI prophylaxis: protonix Glucose control: monitor Q 4 Mobility: bedrest, mobilize as tolerated Code Status: FULL Family Communication:left VM 11/17, will update 11/18 PRN Disposition: ICU  Labs   CBC: Recent Labs  Lab 06/22/20 0508 06/23/20 0449 06/24/20 0440 06/25/20 0047 06/25/20 0514  WBC 11.8* 10.1 12.0* 11.0* 9.8  NEUTROABS 8.9*  7.4 9.6* 8.8* 6.9  HGB 10.7* 9.7* 9.8* 9.4* 9.1*  HCT 31.4* 27.4* 29.4* 29.2* 28.7*  MCV 107.5* 106.2* 110.1* 112.7* 115.3*  PLT 186 176 196 212 212    Basic Metabolic Panel: Recent Labs  Lab 06/21/20 0633 06/22/20 0508 06/23/20 0449 06/24/20 0440 06/25/20 0514  NA 136 140 142 144 145  K 4.4 3.3* 3.1* 4.4 4.2  CL 106 112* 114* 116* 118*  CO2 20* 16* 19* 21* 21*  GLUCOSE 133* 120* 106* 117* 120*  BUN 12 16 26* 35* 45*  CREATININE 1.54* 2.46* 2.66* 2.68* 2.97*  CALCIUM 7.2* 7.0* 7.4* 7.9* 8.3*  MG 2.0 1.8 2.6* 2.8* 2.9*  PHOS 4.6 3.9 4.3 5.7* 5.7*   GFR: Estimated Creatinine Clearance: 51.6 mL/min (A) (by C-G formula based on SCr of 2.97 mg/dL (H)). Recent Labs  Lab 06/19/20 2209 06/20/20 0348 06/20/20 1032 06/20/20 1325 06/21/20 3220 06/21/20 2542 06/21/20 0941 06/22/20 0508 06/22/20 0508 06/23/20 0449 06/24/20 0440 06/25/20 0047 06/25/20 0514  PROCALCITON  --   --   --   --  0.12  --   --  0.35  --  0.45  --  0.35  --   WBC  --    < >  --   --  17.5*   < >  --  11.8*   < >  10.1 12.0* 11.0* 9.8  LATICACIDVEN 3.3*  --  2.5* 2.1*  --   --  1.1  --   --   --   --   --   --    < > = values in this interval not displayed.    Liver Function Tests: Recent Labs  Lab 06/20/20 0348 06/22/20 0508 06/23/20 0449 06/24/20 0440 06/25/20 0514  AST 62* 31 34 32 26  ALT 46* 25 19 20 15   ALKPHOS 44 54 45 42 36*  BILITOT 1.0 1.4* 1.0 0.8 0.8  PROT 6.7 5.9* 5.8* 6.3* 6.5  ALBUMIN 3.4* 2.9* 2.5* 2.5* 2.5*   No results for input(s): LIPASE, AMYLASE in the last 168 hours. Recent Labs  Lab 06/19/20 2043 06/20/20 0348 06/20/20 1032 06/22/20 0508  AMMONIA 76* 47* 38* 41*    ABG    Component Value Date/Time   PHART 7.32 (L) 06/22/2020 0505   PCO2ART 30 (L) 06/22/2020 0505   PO2ART 125 (H) 06/22/2020 0505   HCO3 15.5 (L) 06/22/2020 0505   ACIDBASEDEF 9.4 (H) 06/22/2020 0505   O2SAT 98.6 06/22/2020 0505     Coagulation Profile: Recent Labs  Lab 06/19/20 2209   INR 1.0    Cardiac Enzymes: Recent Labs  Lab 06/19/20 2209 06/21/20 0633 06/22/20 0508 06/23/20 0449 06/24/20 0440  CKTOTAL 138 107 722* 1,276* 686*    HbA1C: No results found for: HGBA1C  CBG: Recent Labs  Lab 06/24/20 1529 06/24/20 1956 06/25/20 0020 06/25/20 0338 06/25/20 0715  GLUCAP 120* 110* 124* 97 104*    Critical care time: 33 minutes      Critical care provider statement:    Critical care time (minutes):  33   Critical care time was exclusive of:  Separately billable procedures and  treating other patients   Critical care was necessary to treat or prevent imminent or  life-threatening deterioration of the following conditions:  Altered mental status with encephalopathy, possible sepsis, aspiration pneumonia, substance abuse , alcoholism   Critical care was time spent personally by me on the following  activities:  Development of treatment plan with patient or surrogate,  discussions with consultants, evaluation of patient's response to  treatment, examination of patient, obtaining history from patient or  surrogate, ordering and performing treatments and interventions, ordering  and review of laboratory studies and re-evaluation of patient's condition   I assumed direction of critical care for this patient from another  provider in my specialty: no       06/27/20, M.D.  Pulmonary & Critical Care Medicine  [facility]

## 2020-06-25 NOTE — Progress Notes (Signed)
PHARMACY CONSULT NOTE - FOLLOW UP  Pharmacy Consult for Electrolyte Monitoring and Replacement   Recent Labs: Potassium (mmol/L)  Date Value  06/25/2020 4.2  02/12/2013 3.9   Magnesium (mg/dL)  Date Value  62/69/4854 2.9 (H)   Calcium (mg/dL)  Date Value  62/70/3500 8.3 (L)   Calcium, Total (mg/dL)  Date Value  93/81/8299 8.7   Albumin (g/dL)  Date Value  37/16/9678 2.5 (L)  02/12/2013 3.6   Phosphorus (mg/dL)  Date Value  93/81/0175 5.7 (H)   Sodium (mmol/L)  Date Value  06/25/2020 145  02/12/2013 139     Assessment: 39 year old male here with encephalopathy s/t alcohol withdrawal. Patient intubated and sedated, requiring Dilaudid, propofol and Versed to maintain sedation. Patient started on Librium and phenobarbital taper for alcohol withdrawal and to help facilitate weaning from vent. Pharmacy to manage electrolytes.  Goal of Therapy:  Electrolytes WNL  Plan:  No replacement needed today.   Ronnald Ramp ,PharmD Clinical Pharmacist 06/25/2020 8:00 AM

## 2020-06-25 NOTE — Progress Notes (Signed)
Central line dressing changed due to tube feeding draining onto the dressing and soiling it. The sight looks red and irritated. Will consult IV team for a PICC line placement.

## 2020-06-26 ENCOUNTER — Inpatient Hospital Stay: Payer: Medicaid Other

## 2020-06-26 DIAGNOSIS — G9341 Metabolic encephalopathy: Secondary | ICD-10-CM

## 2020-06-26 LAB — HEMOGLOBIN A1C
Hgb A1c MFr Bld: 5.3 % (ref 4.8–5.6)
Mean Plasma Glucose: 105.41 mg/dL

## 2020-06-26 LAB — COMPREHENSIVE METABOLIC PANEL
ALT: 20 U/L (ref 0–44)
AST: 28 U/L (ref 15–41)
Albumin: 2.4 g/dL — ABNORMAL LOW (ref 3.5–5.0)
Alkaline Phosphatase: 40 U/L (ref 38–126)
Anion gap: 12 (ref 5–15)
BUN: 58 mg/dL — ABNORMAL HIGH (ref 6–20)
CO2: 19 mmol/L — ABNORMAL LOW (ref 22–32)
Calcium: 8.8 mg/dL — ABNORMAL LOW (ref 8.9–10.3)
Chloride: 116 mmol/L — ABNORMAL HIGH (ref 98–111)
Creatinine, Ser: 2.94 mg/dL — ABNORMAL HIGH (ref 0.61–1.24)
GFR, Estimated: 27 mL/min — ABNORMAL LOW (ref >60)
Glucose, Bld: 136 mg/dL — ABNORMAL HIGH (ref 70–99)
Potassium: 3.8 mmol/L (ref 3.5–5.1)
Sodium: 147 mmol/L — ABNORMAL HIGH (ref 135–145)
Total Bilirubin: 0.8 mg/dL (ref 0.3–1.2)
Total Protein: 7 g/dL (ref 6.5–8.1)

## 2020-06-26 LAB — BLOOD GAS, ARTERIAL
Acid-base deficit: 9.9 mmol/L — ABNORMAL HIGH (ref 0.0–2.0)
Acid-base deficit: 7.9 mmol/L — ABNORMAL HIGH (ref 0.0–2.0)
Allens test (pass/fail): POSITIVE — AB
Bicarbonate: 19.2 mmol/L — ABNORMAL LOW (ref 20.0–28.0)
Bicarbonate: 18.9 mmol/L — ABNORMAL LOW (ref 20.0–28.0)
FIO2: 0.5
FIO2: 0.4
MECHVT: 550 mL
O2 Saturation: 93.9 %
O2 Saturation: 94.2 %
PEEP: 10 cmH2O
PEEP: 10 cmH2O
Patient temperature: 37
Patient temperature: 37
Pressure control: 12 cmH2O
RATE: 16 resp/min
RATE: 16 resp/min
pCO2 arterial: 54 mmHg — ABNORMAL HIGH (ref 32.0–48.0)
pCO2 arterial: 43 mmHg (ref 32.0–48.0)
pH, Arterial: 7.16 — CL (ref 7.350–7.450)
pH, Arterial: 7.25 — ABNORMAL LOW (ref 7.350–7.450)
pO2, Arterial: 89 mmHg (ref 83.0–108.0)
pO2, Arterial: 83 mmHg (ref 83.0–108.0)

## 2020-06-26 LAB — CBC WITH DIFFERENTIAL/PLATELET
Abs Immature Granulocytes: 0.4 10*3/uL — ABNORMAL HIGH (ref 0.00–0.07)
Basophils Absolute: 0 10*3/uL (ref 0.0–0.1)
Basophils Relative: 0 %
Eosinophils Absolute: 0.2 10*3/uL (ref 0.0–0.5)
Eosinophils Relative: 2 %
HCT: 28.7 % — ABNORMAL LOW (ref 39.0–52.0)
Hemoglobin: 9.2 g/dL — ABNORMAL LOW (ref 13.0–17.0)
Immature Granulocytes: 4 %
Lymphocytes Relative: 7 %
Lymphs Abs: 0.8 10*3/uL (ref 0.7–4.0)
MCH: 36.7 pg — ABNORMAL HIGH (ref 26.0–34.0)
MCHC: 32.1 g/dL (ref 30.0–36.0)
MCV: 114.3 fL — ABNORMAL HIGH (ref 80.0–100.0)
Monocytes Absolute: 1.3 10*3/uL — ABNORMAL HIGH (ref 0.1–1.0)
Monocytes Relative: 12 %
Neutro Abs: 8.2 10*3/uL — ABNORMAL HIGH (ref 1.7–7.7)
Neutrophils Relative %: 75 %
Platelets: 247 10*3/uL (ref 150–400)
RBC: 2.51 MIL/uL — ABNORMAL LOW (ref 4.22–5.81)
RDW: 15.4 % (ref 11.5–15.5)
Smear Review: NORMAL
WBC: 10.9 10*3/uL — ABNORMAL HIGH (ref 4.0–10.5)
nRBC: 0.7 % — ABNORMAL HIGH (ref 0.0–0.2)

## 2020-06-26 LAB — GLUCOSE, CAPILLARY
Glucose-Capillary: 112 mg/dL — ABNORMAL HIGH (ref 70–99)
Glucose-Capillary: 114 mg/dL — ABNORMAL HIGH (ref 70–99)
Glucose-Capillary: 124 mg/dL — ABNORMAL HIGH (ref 70–99)
Glucose-Capillary: 125 mg/dL — ABNORMAL HIGH (ref 70–99)
Glucose-Capillary: 126 mg/dL — ABNORMAL HIGH (ref 70–99)
Glucose-Capillary: 146 mg/dL — ABNORMAL HIGH (ref 70–99)

## 2020-06-26 LAB — MAGNESIUM: Magnesium: 2.7 mg/dL — ABNORMAL HIGH (ref 1.7–2.4)

## 2020-06-26 LAB — PHOSPHORUS: Phosphorus: 5.2 mg/dL — ABNORMAL HIGH (ref 2.5–4.6)

## 2020-06-26 LAB — TRIGLYCERIDES: Triglycerides: 239 mg/dL — ABNORMAL HIGH (ref <150)

## 2020-06-26 MED ORDER — INSULIN ASPART 100 UNIT/ML ~~LOC~~ SOLN
0.0000 [IU] | SUBCUTANEOUS | Status: DC
  Administered 2020-06-27 – 2020-07-12 (×18): 2 [IU] via SUBCUTANEOUS
  Administered 2020-07-12: 3 [IU] via SUBCUTANEOUS
  Administered 2020-07-12 – 2020-07-13 (×4): 2 [IU] via SUBCUTANEOUS
  Administered 2020-07-13 (×2): 3 [IU] via SUBCUTANEOUS
  Administered 2020-07-13 – 2020-07-16 (×14): 2 [IU] via SUBCUTANEOUS
  Administered 2020-07-17: 3 [IU] via SUBCUTANEOUS
  Administered 2020-07-17 (×4): 2 [IU] via SUBCUTANEOUS
  Administered 2020-07-18: 3 [IU] via SUBCUTANEOUS
  Administered 2020-07-18: 2 [IU] via SUBCUTANEOUS
  Administered 2020-07-18: 3 [IU] via SUBCUTANEOUS
  Administered 2020-07-19 – 2020-07-20 (×2): 2 [IU] via SUBCUTANEOUS
  Administered 2020-07-20: 3 [IU] via SUBCUTANEOUS
  Administered 2020-07-20 – 2020-07-21 (×3): 2 [IU] via SUBCUTANEOUS
  Administered 2020-07-21 – 2020-07-22 (×3): 3 [IU] via SUBCUTANEOUS
  Administered 2020-07-22 – 2020-08-01 (×11): 2 [IU] via SUBCUTANEOUS
  Filled 2020-06-26 (×64): qty 1

## 2020-06-26 MED ORDER — THIAMINE HCL 100 MG/ML IJ SOLN
500.0000 mg | INTRAVENOUS | Status: AC
  Administered 2020-06-26 – 2020-06-30 (×5): 500 mg via INTRAVENOUS
  Filled 2020-06-26 (×6): qty 5

## 2020-06-26 MED ORDER — NYSTATIN 100000 UNIT/GM EX POWD
Freq: Two times a day (BID) | CUTANEOUS | Status: DC
  Administered 2020-07-08 – 2020-07-24 (×3): 1 via TOPICAL
  Filled 2020-06-26 (×2): qty 15

## 2020-06-26 NOTE — Progress Notes (Signed)
Neuro: remains sedated while on the ventilator, required multiple PRNs for ventilator dyssnychrony, otherwise unresponsive to stimulus, WUA not complete due to ventilator requirements Resp: vent settings adjusted per RT, had dyssynchrony with most cares, some self resolved CV: TMAX 38.5, tylenol given. Intermittent hypertension/tachycardia throughout the day often related to cares/fever GIGU: feeds running throughout the day, tolerating well, no emesis. BM via fecal management system, foley in place and patent Skin: nystatin applied to groin,  Social: Mom and "partner" Quentin Mulling both called for updates today.  All questions and concerns addressed.

## 2020-06-26 NOTE — Progress Notes (Signed)
0500 Report received from [facility]. 0600 CIWA inaccurate due to sedation and ventilation.

## 2020-06-26 NOTE — Progress Notes (Signed)
PHARMACY CONSULT NOTE - FOLLOW UP  Pharmacy Consult for Electrolyte Monitoring and Replacement   Recent Labs: Potassium (mmol/L)  Date Value  06/26/2020 3.8  02/12/2013 3.9   Magnesium (mg/dL)  Date Value  28/00/3491 2.7 (H)   Calcium (mg/dL)  Date Value  79/15/0569 8.8 (L)   Calcium, Total (mg/dL)  Date Value  79/48/0165 8.7   Albumin (g/dL)  Date Value  53/74/8270 2.4 (L)  02/12/2013 3.6   Phosphorus (mg/dL)  Date Value  78/67/5449 5.2 (H)   Sodium (mmol/L)  Date Value  06/26/2020 147 (H)  02/12/2013 139     Assessment: 39 year old male here with encephalopathy s/t alcohol withdrawal. Patient intubated and sedated, requiring Dilaudid, propofol and Versed to maintain sedation. Patient started on Librium and phenobarbital taper for alcohol withdrawal and to help facilitate weaning from vent. Pharmacy to manage electrolytes.  Goal of Therapy:  Electrolytes WNL 11/22  Plan:  No replacement needed today.   Martyn Malay ,PharmD Clinical Pharmacist 06/26/2020 8:35 AM

## 2020-06-26 NOTE — Progress Notes (Signed)
0900 Patients' ABG pH of 7.16 addressed by B.L.Rust Annia Friendly NP with ventilator changes and a repeat ABG later today.

## 2020-06-26 NOTE — Progress Notes (Signed)
NAME:  Nicholas Valdez, MRN:  [id number], DOB:  02/07/1981, LOS: 7 ADMISSION DATE:  06/19/2020, CONSULTATION DATE:  06/20/2020 REFERRING MD:  Dr. Alanda Slim, CHIEF COMPLAINT:  Altered Mental Status   Brief History   39 yo male significant history of ETOH and polysubstance abuse presented to the ED with altered mental status admitted to the ICU requiring precedex drip and ultimately intubation & mechanical ventilation.  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 WBC doubled 11/17, concern for aspiration PNA, PCT pending, CXR inconclusive Now requiring vasopressors 06/22/20 Patient intubated and sedated, overnight RASS: -5 no acute events. Plan today to aggressively titrate down on versed drip, fentanyl drip discontinued 06/23/20- patient unable to have SBT today due to sedation. Despite decreasing versed to 7 and completely stopping Propofol patient is only partially arousable. Will continue to wean sedation to RASS-0 with plan for SBT. 06/24/20- patient is being weaned off sedation as possible. 06/25/20-issues with hypoxia and asynchrony, patient has been intermittently febrile Consults:    Procedures:  06/20/20 ETT >> 06/20/20 CVC 3 L >>  Significant Diagnostic Tests:  06/19/20 CT head >> no acute intracranial abnormality 06/20/20 US abdomen >> mild hepatic steatosis 06/21/20 Echocardiogram >> LVEF 65-70%, no wall motion abnormalities, no other abnormalities noted Micro Data:  06/19/20 COVID-19 >> negative 06/19/20 Influenza PCR A/B >> negative 06/20/20 MRSA PCR >> negative 06/20/20 tracheal aspirate >>  Antimicrobials:  06/21/20 Unasyn >>  Interim history/subjective:  Repeat ABG done this AM due to  intermittent hypoxia and asynchrony. Adjusted ventilator settings from [facility] to pressure control due to reduced tidal volumes.  Once change was made patient seems to be tolerating the ventilator better, we will repeat a blood gas at noon.  Labs/ Imaging personally reviewed Net: -421 (+ 3.5L since admit) Na+/ K+: 147/ 38. BUN/Cr.: 58/ 2.94 Hgb: 9.2  WBC/ TMAX: 10.9/ 38.6 PCT: 0.35  ABG: 7.16/ 54/ 89/ 19.2 CXR 06/26/20: Persistent atelectasis in the LL base & increasing atelectasis in the mid lung  Objective   Blood pressure 133/66, pulse 96, temperature (!) 100.6 F (38.1 C), resp. rate 15, height 5\' 10"  (1.778 m), weight (!) 155.6 kg, SpO2 96 %.    Vent Mode: PRVC FiO2 (%):  [28 %-50 %] 50 % Set Rate:  [16 bmp] 16 bmp Vt Set:  [550 mL] 550 mL PEEP:  [5 cmH20-10 cmH20] 10 cmH20 Plateau Pressure:  [14 cmH20-19 cmH20] 14 cmH20   Intake/Output Summary (Last 24 hours) at 06/26/2020 0853 Last data filed at 06/26/2020 0700 Gross per 24 hour  Intake 2400.65 ml  Output 2770 ml  Net -369.35 ml   Filed Weights   06/22/20 0352 06/23/20 0451 06/24/20 0500  Weight: (!) 150.9 kg (!) 154.6 kg (!) 155.6 kg    Examination: General: Adult male, critically ill, lying in bed intubated & sedated requiring mechanical ventilation HEENT: MM pink/moist, anicteric, atraumatic, neck supple Neuro: sedated, unable to follow commands, PERRL +3, MAE CV: s1s2 RRR, NSR on monitor, no r/m/g Pulm: Regular, non labored on PRVC> adjusted PCV 50%, PEEP 10, breath sounds rhonchi-BUL & diminished-BLL GI: soft, rounded, bs x 4 GU: foley in place with yellow urine with sediment Skin: heat rash on back otherwise no rashes/lesions noted Extremities: warm/dry, pulses + 2 R/P,  trace BLE edema noted  Resolved Hospital Problem list     Assessment & Plan:  Acute Hypoxic Respiratory failure secondary to acute encephalopathy due to Alcohol withdrawal Suspected Aspiration Pneumonia Patient agitated due to ETOH  withdrawal, obtunded with medication administration- unable to protect airway and intubated on 11/16. Concerns for aspiration PNA overnight due to copious secretions and increased FiO2 requirements, febrile 06/21/20. 11/22-patient switched from [facility] to pressure control, due to asynchrony and reduce tidal volumes.  Repeat blood gas pending. - Ventilator settings: PC 8 mL/kg, 50% FiO2, 10 PEEP, continue ventilator support & lung protective strategies - Wean PEEP & FiO2 as tolerated, maintain SpO2 > 90% - Head of bed elevated 30 degrees, VAP protocol in place - Plateau pressures less than 30 cm H20  - Intermittent chest x-ray & ABG PRN - Daily WUA with SBT as tolerated  - Ensure adequate pulmonary hygiene  - F/u cultures, trend PCT - PAD protocol in place: continue Dilaudid drip & Versed drip - continue vecuronium IVP PRN to maintain ventilator synchrony -Continue Unasyn for aspiration pneumonia coverage  Acute Encephalopathy due to ETOH withdrawal Hepatic Steatosis Polysubstance abuse Ammonia- trending down: 76 ~ 47 ~ 38 - Continue lactulose -Continue thiamine and folic acid - continue lactulose -Continue Librium 4 times daily -Continue gentle IV fluid resuscitation -CIWA protocol  Acute Kidney Injury  In the setting of sepsis. Baseline creatinine 0.88, creatinine on admission 1.54 this AM (11/17). Cr. Trending up 2.46/ serum CO2 16/ CK 722.  11/17-ordered sodium bicarb infusion for 12 h 150 meq at 75 mL/h. 11/22-slight downward trend in creatinine, 2.94. - Strict I/O's: alert provider if UOP < 0.5 mL/kg/hr -continue IV fluids - Strict I/O's: alert provider if UOP < 0.5 mL/kg/hr - Daily CMP, replace electrolytes PRN - Avoid nephrotoxic agents as able, ensure adequate renal perfusion  Sepsis with suspected Septic Shock due to suspected Aspiration Pneumonia Lactic: 3.3 ~ 2.5 ~ 2.1~1.1 PCT: 0.12~0.35~ 0.45~0.35 Increased secretions, FiO2 and pressor requirements overnight- 06/20/20 Off  vasopressors since 11/18 -Trend PCT/follow-up cultures -Monitor WBC and fever curve -Continuous cardiac monitoring -Continue Unasyn for aspiration pneumonia coverage  Hypoalbuminia in the setting of ETOH abuse -Continue tube feeds -Continue Reglan twice daily for suspected gastritis  -Every 4 CBG monitoring  -SSI moderate scale ordered for blood sugar control   Best practice:   Diet: NPO Pain/Anxiety/Delirium protocol (if indicated): fentanyl & versed VAP protocol (if indicated): initiated DVT prophylaxis: enoxaparin SQ GI prophylaxis: protonix Glucose control: monitor Q 4 Mobility: bedrest, mobilize as tolerated Code Status: FULL Family Communication: Will update family as needed Disposition: ICU  Labs   CBC: Recent Labs  Lab 06/23/20 0449 06/24/20 0440 06/25/20 0047 06/25/20 0514 06/26/20 0348  WBC 10.1 12.0* 11.0* 9.8 10.9*  NEUTROABS 7.4 9.6* 8.8* 6.9 8.2*  HGB 9.7* 9.8* 9.4* 9.1* 9.2*  HCT 27.4* 29.4* 29.2* 28.7* 28.7*  MCV 106.2* 110.1* 112.7* 115.3* 114.3*  PLT 176 196 212 212 247    Basic Metabolic Panel: Recent Labs  Lab 06/22/20 0508 06/23/20 0449 06/24/20 0440 06/25/20 0514 06/26/20 0348  NA 140 142 144 145 147*  K 3.3* 3.1* 4.4 4.2 3.8  CL 112* 114* 116* 118* 116*  CO2 16* 19* 21* 21* 19*  GLUCOSE 120* 106* 117* 120* 136*  BUN 16 26* 35* 45* 58*  CREATININE 2.46* 2.66* 2.68* 2.97* 2.94*  CALCIUM 7.0* 7.4* 7.9* 8.3* 8.8*  MG 1.8 2.6* 2.8* 2.9* 2.7*  PHOS 3.9 4.3 5.7* 5.7* 5.2*   GFR: Estimated  Creatinine Clearance: 52.1 mL/min (A) (by C-G formula based on SCr of 2.94 mg/dL (H)). Recent Labs  Lab 06/19/20 2209 06/20/20 0348 06/20/20 1032 06/20/20 1325 06/21/20 1607 06/21/20 3710 06/21/20 0941 06/22/20 0508 06/22/20 0508 06/23/20 0449 06/23/20 0449 06/24/20 0440 06/25/20 0047 06/25/20 0514 06/26/20 0348  PROCALCITON  --   --   --   --  0.12  --   --  0.35  --  0.45  --   --  0.35  --   --   WBC  --    < >  --   --  17.5*   < >   --  11.8*   < > 10.1   < > 12.0* 11.0* 9.8 10.9*  LATICACIDVEN 3.3*  --  2.5* 2.1*  --   --  1.1  --   --   --   --   --   --   --   --    < > = values in this interval not displayed.    Liver Function Tests: Recent Labs  Lab 06/22/20 0508 06/23/20 0449 06/24/20 0440 06/25/20 0514 06/26/20 0348  AST 31 34 32 26 28  ALT 25 19 20 15 20   ALKPHOS 54 45 42 36* 40  BILITOT 1.4* 1.0 0.8 0.8 0.8  PROT 5.9* 5.8* 6.3* 6.5 7.0  ALBUMIN 2.9* 2.5* 2.5* 2.5* 2.4*   No results for input(s): LIPASE, AMYLASE in the last 168 hours. Recent Labs  Lab 06/19/20 2043 06/20/20 0348 06/20/20 1032 06/22/20 0508  AMMONIA 76* 47* 38* 41*    ABG    Component Value Date/Time   PHART 7.16 (LL) 06/26/2020 0826   PCO2ART 54 (H) 06/26/2020 0826   PO2ART 89 06/26/2020 0826   HCO3 19.2 (L) 06/26/2020 0826   ACIDBASEDEF 9.9 (H) 06/26/2020 0826   O2SAT 93.9 06/26/2020 0826     Coagulation Profile: Recent Labs  Lab 06/19/20 2209  INR 1.0    Cardiac Enzymes: Recent Labs  Lab 06/19/20 2209 06/21/20 0633 06/22/20 0508 06/23/20 0449 06/24/20 0440  CKTOTAL 138 107 722* 1,276* 686*    HbA1C: No results found for: HGBA1C  CBG: Recent Labs  Lab 06/25/20 1603 06/25/20 1950 06/25/20 2333 06/26/20 0344 06/26/20 0707  GLUCAP 123* 109* 113* 114* 126*    Critical care time: 45 minutes       06/28/20, AGACNP-BC Acute Care Nurse Practitioner [place] [facility]   [phone] / [phone] Please see Amion for pager details.

## 2020-06-27 ENCOUNTER — Inpatient Hospital Stay: Payer: Medicaid Other

## 2020-06-27 DIAGNOSIS — J9601 Acute respiratory failure with hypoxia: Secondary | ICD-10-CM

## 2020-06-27 LAB — CBC WITH DIFFERENTIAL/PLATELET
Abs Immature Granulocytes: 0.55 10*3/uL — ABNORMAL HIGH (ref 0.00–0.07)
Basophils Absolute: 0.1 10*3/uL (ref 0.0–0.1)
Basophils Relative: 1 %
Eosinophils Absolute: 0.2 10*3/uL (ref 0.0–0.5)
Eosinophils Relative: 2 %
HCT: 29 % — ABNORMAL LOW (ref 39.0–52.0)
Hemoglobin: 9.3 g/dL — ABNORMAL LOW (ref 13.0–17.0)
Immature Granulocytes: 5 %
Lymphocytes Relative: 17 %
Lymphs Abs: 2.1 10*3/uL (ref 0.7–4.0)
MCH: 35.9 pg — ABNORMAL HIGH (ref 26.0–34.0)
MCHC: 32.1 g/dL (ref 30.0–36.0)
MCV: 112 fL — ABNORMAL HIGH (ref 80.0–100.0)
Monocytes Absolute: 1.6 10*3/uL — ABNORMAL HIGH (ref 0.1–1.0)
Monocytes Relative: 13 %
Neutro Abs: 7.7 10*3/uL (ref 1.7–7.7)
Neutrophils Relative %: 62 %
Platelets: 288 10*3/uL (ref 150–400)
RBC: 2.59 MIL/uL — ABNORMAL LOW (ref 4.22–5.81)
RDW: 15.3 % (ref 11.5–15.5)
Smear Review: NORMAL
WBC: 12.2 10*3/uL — ABNORMAL HIGH (ref 4.0–10.5)
nRBC: 1.1 % — ABNORMAL HIGH (ref 0.0–0.2)

## 2020-06-27 LAB — GLUCOSE, CAPILLARY
Glucose-Capillary: 109 mg/dL — ABNORMAL HIGH (ref 70–99)
Glucose-Capillary: 132 mg/dL — ABNORMAL HIGH (ref 70–99)
Glucose-Capillary: 106 mg/dL — ABNORMAL HIGH (ref 70–99)
Glucose-Capillary: 125 mg/dL — ABNORMAL HIGH (ref 70–99)
Glucose-Capillary: 117 mg/dL — ABNORMAL HIGH (ref 70–99)
Glucose-Capillary: 133 mg/dL — ABNORMAL HIGH (ref 70–99)

## 2020-06-27 LAB — COMPREHENSIVE METABOLIC PANEL
ALT: 22 U/L (ref 0–44)
AST: 34 U/L (ref 15–41)
Albumin: 2.5 g/dL — ABNORMAL LOW (ref 3.5–5.0)
Alkaline Phosphatase: 46 U/L (ref 38–126)
Anion gap: 11 (ref 5–15)
BUN: 66 mg/dL — ABNORMAL HIGH (ref 6–20)
CO2: 22 mmol/L (ref 22–32)
Calcium: 9.1 mg/dL (ref 8.9–10.3)
Chloride: 119 mmol/L — ABNORMAL HIGH (ref 98–111)
Creatinine, Ser: 2.69 mg/dL — ABNORMAL HIGH (ref 0.61–1.24)
GFR, Estimated: 31 mL/min — ABNORMAL LOW (ref >60)
Glucose, Bld: 138 mg/dL — ABNORMAL HIGH (ref 70–99)
Potassium: 3.4 mmol/L — ABNORMAL LOW (ref 3.5–5.1)
Sodium: 152 mmol/L — ABNORMAL HIGH (ref 135–145)
Total Bilirubin: 0.9 mg/dL (ref 0.3–1.2)
Total Protein: 6.9 g/dL (ref 6.5–8.1)

## 2020-06-27 LAB — BLOOD GAS, ARTERIAL
Bicarbonate: 18.4 mmol/L — ABNORMAL LOW (ref 20.0–28.0)
FIO2: 0.4
O2 Saturation: 93.3 %
PEEP: 10 cmH2O
Pressure control: 12 cmH2O
RATE: 16 resp/min
pCO2 arterial: 41 mmHg (ref 32.0–48.0)
pH, Arterial: 7.26 — ABNORMAL LOW (ref 7.350–7.450)
pO2, Arterial: 78 mmHg — ABNORMAL LOW (ref 83.0–108.0)

## 2020-06-27 LAB — CULTURE, RESPIRATORY W GRAM STAIN: Culture: NORMAL

## 2020-06-27 LAB — PHOSPHORUS: Phosphorus: 4 mg/dL (ref 2.5–4.6)

## 2020-06-27 LAB — MAGNESIUM: Magnesium: 2.6 mg/dL — ABNORMAL HIGH (ref 1.7–2.4)

## 2020-06-27 MED ORDER — PHENOBARBITAL 20 MG/5ML PO ELIX
30.0000 mg | ORAL_SOLUTION | Freq: Two times a day (BID) | ORAL | Status: DC
  Administered 2020-06-27 – 2020-07-07 (×21): 30 mg
  Filled 2020-06-27 (×25): qty 7.5

## 2020-06-27 MED ORDER — SODIUM BICARBONATE 8.4 % IV SOLN
50.0000 meq | Freq: Once | INTRAVENOUS | Status: AC
  Administered 2020-06-27: 50 meq via INTRAVENOUS
  Filled 2020-06-27: qty 50

## 2020-06-27 MED ORDER — PHENOBARBITAL 20 MG/5ML PO ELIX
30.0000 mg | ORAL_SOLUTION | Freq: Two times a day (BID) | ORAL | Status: DC
  Filled 2020-06-27 (×2): qty 7.5

## 2020-06-27 MED ORDER — POTASSIUM CHLORIDE 20 MEQ PO PACK
40.0000 meq | PACK | Freq: Once | ORAL | Status: AC
  Administered 2020-06-27: 40 meq
  Filled 2020-06-27: qty 2

## 2020-06-27 MED ORDER — FREE WATER
200.0000 mL | Status: DC
  Administered 2020-06-27 – 2020-07-01 (×23): 200 mL

## 2020-06-27 NOTE — TOC Progression Note (Signed)
Transition of Care [facility]) - Progression Note    Patient Details  Name: Nicholas Valdez MRN: [id number] Date of Birth: 21-Apr-1981  Transition of Care [facility]) CM/SW Contact  Marina Goodell Phone Number:  [phone] 06/27/2020, 9:34 AM  Clinical Narrative:     CSW spoke with patient's Nicholas Valdez, Nicholas Valdez (Mother) [phone] for update on patient status.  CSW explained the role of TOC inpatient care. Ms. Maynez stated patient is able to perform all ADLs without difficulty. Ms. Reish stated patient is able to drive but is not working currently.  Ms. Kerce stated the patient had "been drinking alcohol for as long as I can remember." but stated the patient's alcohol use has increased in the last six months. Ms. Podolak stated the patient was attending [facility] for inpatient rehab but decided "to drink a lot." CSW stated when patient is awake and oriented I will bring him resource list for other inpatient and patient rehab facilities and organizations. Ms. Fare verbalized her understanding.  Expected Discharge Plan: Home/Self Care Barriers to Discharge: Continued Medical Work up, Active Substance Use - Placement  Expected Discharge Plan and Services Expected Discharge Plan: Home/Self Care In-house Referral: Clinical Social Work   Post Acute Care Choice: IP Rehab Living arrangements for the past 2 months: Single Family Home                                       Social Determinants of Health (SDOH) Interventions    Readmission Risk Interventions Readmission Risk Prevention Plan 12/16/2018  Transportation Screening Complete  PCP or Specialist Appt within 5-7 Days Complete  Home Care Screening Complete  Medication Review (RN CM) Complete  Some recent data might be hidden

## 2020-06-27 NOTE — Progress Notes (Signed)
PHARMACY CONSULT NOTE - FOLLOW UP  Pharmacy Consult for Electrolyte Monitoring and Replacement   Recent Labs: Potassium (mmol/L)  Date Value  06/27/2020 3.4 (L)  02/12/2013 3.9   Magnesium (mg/dL)  Date Value  01/26/7627 2.6 (H)   Calcium (mg/dL)  Date Value  31/51/7616 9.1   Calcium, Total (mg/dL)  Date Value  07/37/1062 8.7   Albumin (g/dL)  Date Value  69/48/5462 2.5 (L)  02/12/2013 3.6   Phosphorus (mg/dL)  Date Value  70/35/0093 4.0   Sodium (mmol/L)  Date Value  06/27/2020 152 (H)  02/12/2013 139     Assessment: 39 year old male here with encephalopathy s/t alcohol withdrawal. Patient intubated and sedated, requiring Dilaudid, propofol and Versed to maintain sedation. Patient on Librium and phenobarbital BID for alcohol withdrawal and to help facilitate weaning from vent. Pharmacy to manage electrolytes.  Goal of Therapy:  Electrolytes WNL  Plan:  K+ 3.8>3.4; will replete with PO x1 Other Electrolytes WNL  Martyn Malay ,PharmD Clinical Pharmacist 06/27/2020 2:46 PM

## 2020-06-27 NOTE — Progress Notes (Signed)
Desaturated to low 80's again, called respiratory, increased FIO2 to 80 and suctioned patient, slowly improved to 90's again.

## 2020-06-27 NOTE — Progress Notes (Signed)
NAME:  Nicholas Valdez, MRN:  [id number], DOB:  1980/12/19, LOS: 8 ADMISSION DATE:  06/19/2020, CONSULTATION DATE:  06/20/2020 REFERRING MD:  Dr. Alanda Slim, CHIEF COMPLAINT:  Altered Mental Status   Brief History   39 yo male significant history of ETOH and polysubstance abuse presented to the ED with altered mental status admitted to the ICU requiring precedex drip and ultimately intubation & mechanical ventilation.  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 WBC doubled 11/17, concern for aspiration PNA, PCT pending, CXR inconclusive Now requiring vasopressors 06/22/20 Patient intubated and sedated, overnight RASS: -5 no acute events. Plan today to aggressively titrate down on versed drip, fentanyl drip discontinued 06/23/20- patient unable to have SBT today due to sedation. Despite decreasing versed to 7 and completely stopping Propofol patient is only partially arousable. Will continue to wean sedation to RASS-0 with plan for SBT. 06/24/20- patient is being weaned off sedation as possible. 06/25/20-issues with hypoxia and asynchrony, patient has been intermittently febrile Consults:    Procedures:  06/20/20 ETT >> 06/20/20 CVC 3 L >>  Significant Diagnostic Tests:  06/19/20 CT head >> no acute intracranial abnormality 06/20/20 US abdomen >> mild hepatic steatosis 06/21/20 Echocardiogram >> LVEF 65-70%, no wall motion abnormalities, no other abnormalities noted Micro Data:  06/19/20 COVID-19 >> negative 06/19/20 Influenza PCR A/B >> negative 06/20/20 MRSA PCR >> negative 06/20/20 tracheal aspirate >>  Antimicrobials:  06/21/20 Unasyn >>  Interim history/subjective:  No significant events  overnight Remains sedated on the ventilator.  Agitated when sedation is lightened.  Objective   Blood pressure 133/83, pulse 65, temperature 98.4 F (36.9 C), resp. rate 20, height 5\' 10"  (1.778 m), weight (!) 155.6 kg, SpO2 99 %.    Vent Mode: PCV FiO2 (%):  [40 %-80 %] 60 % Set Rate:  [16 bmp-20 bmp] 20 bmp PEEP:  [10 cmH20-12 cmH20] 12 cmH20 Plateau Pressure:  [25 cmH20-26 cmH20] 25 cmH20   Intake/Output Summary (Last 24 hours) at 06/27/2020 1932 Last data filed at 06/27/2020 1727 Gross per 24 hour  Intake 2618.03 ml  Output 1350 ml  Net 1268.03 ml   Filed Weights   06/22/20 0352 06/23/20 0451 06/24/20 0500  Weight: (!) 150.9 kg (!) 154.6 kg (!) 155.6 kg    Examination: General: Adult male, critically ill, lying in bed intubated & sedated requiring mechanical ventilation HEENT: MM pink/moist, anicteric, atraumatic, neck supple Neuro: sedated, unable to follow commands, PERRL +3, MAE CV: s1s2 RRR, NSR on monitor, no r/m/g Pulm: Regular, non labored on PRVC> adjusted PCV 50%, PEEP 10, breath sounds rhonchi-BUL & diminished-BLL GI: soft, rounded, bs x 4 GU: foley in place with yellow urine with sediment Skin: heat rash on back otherwise no rashes/lesions noted Extremities: warm/dry, pulses + 2 R/P, trace BLE edema noted  Resolved Hospital Problem list     Assessment & Plan:  Acute Hypoxic Respiratory failure secondary to acute encephalopathy due to Alcohol withdrawal Suspected Aspiration Pneumonia Patient agitated due to ETOH withdrawal, obtunded with medication administration- unable to protect airway and intubated on 11/16. Concerns for aspiration PNA overnight due to copious secretions and increased FiO2 requirements, febrile 06/21/20. 11/22-patient switched from [facility] to pressure control, due to asynchrony and reduce tidal volumes.  Repeat blood gas pending. -  Ventilator settings: PC 8 mL/kg, 50% FiO2, 10 PEEP, continue ventilator support & lung protective strategies -  Wean PEEP & FiO2 as tolerated, maintain SpO2 > 90% - Head of bed elevated 30 degrees, VAP protocol in place - Plateau pressures less than 30 cm H20  - Intermittent chest x-ray & ABG PRN - Daily WUA with SBT as tolerated  - Ensure adequate pulmonary hygiene  - F/u cultures, trend PCT - PAD protocol in place: continue Dilaudid drip & Versed drip - continue vecuronium IVP PRN to maintain ventilator synchrony -Continue Unasyn for aspiration pneumonia coverage  Acute Encephalopathy due to ETOH withdrawal Hepatic Steatosis Polysubstance abuse Ammonia- trending down: 76 ~ 47 ~ 38 - Continue lactulose -Continue thiamine and folic acid - continue lactulose -Continue Librium 4 times daily -IV Lasix today -Increase free water via OG -Continue CIWA protocol  Acute Kidney Injury  In the setting of sepsis. Baseline creatinine 0.88, creatinine on admission 1.54 this AM (11/17). Cr. Trending up 2.46/ serum CO2 16/ CK 722.  11/17-ordered sodium bicarb infusion for 12 h 150 meq at 75 mL/h. 11/22-slight downward trend in creatinine, 2.94. - Strict I/O's: alert provider if UOP < 0.5 mL/kg/hr -continue IV fluids - Strict I/O's: alert provider if UOP < 0.5 mL/kg/hr - Daily CMP, replace electrolytes PRN - Avoid nephrotoxic agents as able, ensure adequate renal perfusion  Sepsis with suspected Septic Shock due to suspected Aspiration Pneumonia Lactic: 3.3 ~ 2.5 ~ 2.1~1.1 PCT: 0.12~0.35~ 0.45~0.35 Increased secretions, FiO2 and pressor requirements overnight- 06/20/20 Off vasopressors since 11/18 -Trend PCT/follow-up cultures -Monitor WBC and fever curve -Continuous cardiac monitoring -Continue Unasyn for aspiration pneumonia coverage  Hypoalbuminia in the setting of ETOH abuse -Continue tube feeds -Continue Reglan twice daily for suspected gastritis  -Every 4 CBG monitoring  -SSI moderate scale ordered for blood sugar control   Best practice:   Diet: NPO Pain/Anxiety/Delirium protocol (if  indicated): fentanyl & versed VAP protocol (if indicated): initiated DVT prophylaxis: enoxaparin SQ GI prophylaxis: protonix Glucose control: monitor Q 4 Mobility: bedrest, mobilize as tolerated Code Status: FULL Family Communication: Will update family as needed Disposition: ICU  Labs   CBC: Recent Labs  Lab 06/24/20 0440 06/25/20 0047 06/25/20 0514 06/26/20 0348 06/27/20 0442  WBC 12.0* 11.0* 9.8 10.9* 12.2*  NEUTROABS 9.6* 8.8* 6.9 8.2* 7.7  HGB 9.8* 9.4* 9.1* 9.2* 9.3*  HCT 29.4* 29.2* 28.7* 28.7* 29.0*  MCV 110.1* 112.7* 115.3* 114.3* 112.0*  PLT 196 212 212 247 288    Basic Metabolic Panel: Recent Labs  Lab 06/23/20 0449 06/24/20 0440 06/25/20 0514 06/26/20 0348 06/27/20 0442  NA 142 144 145 147* 152*  K 3.1* 4.4 4.2 3.8 3.4*  CL 114* 116* 118* 116* 119*  CO2 19* 21* 21* 19* 22  GLUCOSE 106* 117* 120* 136* 138*  BUN 26* 35* 45* 58* 66*  CREATININE 2.66* 2.68* 2.97* 2.94* 2.69*  CALCIUM 7.4* 7.9* 8.3* 8.8* 9.1  MG 2.6* 2.8* 2.9* 2.7* 2.6*  PHOS 4.3 5.7* 5.7* 5.2* 4.0   GFR: Estimated Creatinine Clearance: 56.9 mL/min (A) (by C-G formula based on SCr of 2.69 mg/dL (H)). Recent Labs  Lab 06/21/20 [phone] 06/21/20 9604 06/21/20 0941 06/22/20 0508 06/22/20 0508 06/23/20 0449 06/24/20 0440 06/25/20 0047 06/25/20 0514 06/26/20 0348 06/27/20 0442  PROCALCITON 0.12  --   --  0.35  --  0.45  --  0.35  --   --   --   WBC 17.5*   < >  --  11.8*   < >  10.1   < > 11.0* 9.8 10.9* 12.2*  LATICACIDVEN  --   --  1.1  --   --   --   --   --   --   --   --    < > = values in this interval not displayed.    Liver Function Tests: Recent Labs  Lab 06/23/20 0449 06/24/20 0440 06/25/20 0514 06/26/20 0348 06/27/20 0442  AST 34 32 26 28 34  ALT 19 20 15 20 22   ALKPHOS 45 42 36* 40 46  BILITOT 1.0 0.8 0.8 0.8 0.9  PROT 5.8* 6.3* 6.5 7.0 6.9  ALBUMIN 2.5* 2.5* 2.5* 2.4* 2.5*   No results for input(s): LIPASE, AMYLASE in the last 168 hours. Recent Labs  Lab  06/22/20 0508  AMMONIA 41*    ABG    Component Value Date/Time   PHART 7.26 (L) 06/27/2020 0340   PCO2ART 41 06/27/2020 0340   PO2ART 78 (L) 06/27/2020 0340   HCO3 18.4 (L) 06/27/2020 0340   ACIDBASEDEF 7.9 (H) 06/26/2020 1140   O2SAT 93.3 06/27/2020 0340     Coagulation Profile: No results for input(s): INR, PROTIME in the last 168 hours.  Cardiac Enzymes: Recent Labs  Lab 06/21/20 0633 06/22/20 0508 06/23/20 0449 06/24/20 0440  CKTOTAL 107 722* 1,276* 686*    HbA1C: Hgb A1c MFr Bld  Date/Time Value Ref Range Status  06/26/2020 06:21 PM 5.3 4.8 - 5.6 % Final    Comment:    (NOTE) Pre diabetes:          5.7%-6.4%  Diabetes:              >6.4%  Glycemic control for   <7.0% adults with diabetes     CBG: Recent Labs  Lab 06/27/20 0401 06/27/20 0728 06/27/20 1131 06/27/20 1615 06/27/20 1915  GLUCAP 109* 132* 106* 125* 117*    Critical care time: 40 minutes      C. 06/29/20, MD [facility]   *This note was dictated using voice recognition software/Dragon.  Despite best efforts to proofread, errors can occur which can change the meaning.  Any change was purely unintentional.

## 2020-06-28 LAB — CBC WITH DIFFERENTIAL/PLATELET
Abs Immature Granulocytes: 0.37 10*3/uL — ABNORMAL HIGH (ref 0.00–0.07)
Basophils Absolute: 0 10*3/uL (ref 0.0–0.1)
Basophils Relative: 0 %
Eosinophils Absolute: 0.5 10*3/uL (ref 0.0–0.5)
Eosinophils Relative: 6 %
HCT: 25.8 % — ABNORMAL LOW (ref 39.0–52.0)
Hemoglobin: 8.7 g/dL — ABNORMAL LOW (ref 13.0–17.0)
Immature Granulocytes: 4 %
Lymphocytes Relative: 12 %
Lymphs Abs: 1.2 10*3/uL (ref 0.7–4.0)
MCH: 36.6 pg — ABNORMAL HIGH (ref 26.0–34.0)
MCHC: 33.7 g/dL (ref 30.0–36.0)
MCV: 108.4 fL — ABNORMAL HIGH (ref 80.0–100.0)
Monocytes Absolute: 0.9 10*3/uL (ref 0.1–1.0)
Monocytes Relative: 10 %
Neutro Abs: 6.3 10*3/uL (ref 1.7–7.7)
Neutrophils Relative %: 68 %
Platelets: 315 10*3/uL (ref 150–400)
RBC: 2.38 MIL/uL — ABNORMAL LOW (ref 4.22–5.81)
RDW: 15.4 % (ref 11.5–15.5)
WBC: 9.3 10*3/uL (ref 4.0–10.5)
nRBC: 1.1 % — ABNORMAL HIGH (ref 0.0–0.2)

## 2020-06-28 LAB — MAGNESIUM: Magnesium: 2.5 mg/dL — ABNORMAL HIGH (ref 1.7–2.4)

## 2020-06-28 LAB — COMPREHENSIVE METABOLIC PANEL
ALT: 24 U/L (ref 0–44)
AST: 35 U/L (ref 15–41)
Albumin: 2.2 g/dL — ABNORMAL LOW (ref 3.5–5.0)
Alkaline Phosphatase: 52 U/L (ref 38–126)
Anion gap: 10 (ref 5–15)
BUN: 65 mg/dL — ABNORMAL HIGH (ref 6–20)
CO2: 20 mmol/L — ABNORMAL LOW (ref 22–32)
Calcium: 9.3 mg/dL (ref 8.9–10.3)
Chloride: 124 mmol/L — ABNORMAL HIGH (ref 98–111)
Creatinine, Ser: 2.35 mg/dL — ABNORMAL HIGH (ref 0.61–1.24)
GFR, Estimated: 36 mL/min — ABNORMAL LOW (ref >60)
Glucose, Bld: 130 mg/dL — ABNORMAL HIGH (ref 70–99)
Potassium: 2.6 mmol/L — CL (ref 3.5–5.1)
Sodium: 154 mmol/L — ABNORMAL HIGH (ref 135–145)
Total Bilirubin: 0.6 mg/dL (ref 0.3–1.2)
Total Protein: 6.7 g/dL (ref 6.5–8.1)

## 2020-06-28 LAB — PHOSPHORUS: Phosphorus: 2.6 mg/dL (ref 2.5–4.6)

## 2020-06-28 LAB — GLUCOSE, CAPILLARY
Glucose-Capillary: 111 mg/dL — ABNORMAL HIGH (ref 70–99)
Glucose-Capillary: 117 mg/dL — ABNORMAL HIGH (ref 70–99)
Glucose-Capillary: 118 mg/dL — ABNORMAL HIGH (ref 70–99)
Glucose-Capillary: 123 mg/dL — ABNORMAL HIGH (ref 70–99)
Glucose-Capillary: 124 mg/dL — ABNORMAL HIGH (ref 70–99)
Glucose-Capillary: 134 mg/dL — ABNORMAL HIGH (ref 70–99)

## 2020-06-28 LAB — POTASSIUM: Potassium: 3.1 mmol/L — ABNORMAL LOW (ref 3.5–5.1)

## 2020-06-28 MED ORDER — DOCUSATE SODIUM 50 MG/5ML PO LIQD
100.0000 mg | Freq: Two times a day (BID) | ORAL | Status: DC | PRN
  Administered 2020-06-29 – 2020-07-16 (×2): 100 mg
  Filled 2020-06-28 (×2): qty 10

## 2020-06-28 MED ORDER — POTASSIUM CHLORIDE 20 MEQ PO PACK
40.0000 meq | PACK | ORAL | Status: DC
  Administered 2020-06-28: 40 meq via ORAL
  Filled 2020-06-28: qty 2

## 2020-06-28 MED ORDER — THIAMINE HCL 100 MG/ML IJ SOLN
100.0000 mg | Freq: Every day | INTRAMUSCULAR | Status: DC
  Administered 2020-07-01 – 2020-07-07 (×7): 100 mg via INTRAVENOUS
  Filled 2020-06-28 (×8): qty 2

## 2020-06-28 MED ORDER — POTASSIUM CHLORIDE 20 MEQ PO PACK
40.0000 meq | PACK | ORAL | Status: AC
  Administered 2020-06-28: 40 meq
  Filled 2020-06-28: qty 2

## 2020-06-28 MED ORDER — POLYVINYL ALCOHOL 1.4 % OP SOLN
1.0000 [drp] | Freq: Two times a day (BID) | OPHTHALMIC | Status: DC
  Administered 2020-06-29 – 2020-08-04 (×66): 1 [drp] via OPHTHALMIC
  Filled 2020-06-28 (×3): qty 15

## 2020-06-28 MED ORDER — POTASSIUM CHLORIDE 20 MEQ PO PACK: 40.0000 meq | PACK | Freq: Once | ORAL | Status: DC

## 2020-06-28 MED ORDER — POTASSIUM CHLORIDE 20 MEQ/15ML (10%) PO SOLN: 40.0000 meq | Freq: Once | ORAL | Status: DC

## 2020-06-28 MED ORDER — HYPROMELLOSE (GONIOSCOPIC) 2.5 % OP SOLN: 1.0000 [drp] | Freq: Two times a day (BID) | OPHTHALMIC | Status: DC

## 2020-06-28 MED ORDER — POLYETHYLENE GLYCOL 3350 17 G PO PACK
17.0000 g | PACK | Freq: Every day | ORAL | Status: DC | PRN
  Administered 2020-06-29 – 2020-07-16 (×2): 17 g
  Filled 2020-06-28 (×2): qty 1

## 2020-06-28 MED ORDER — VITAL AF 1.2 CAL PO LIQD
1000.0000 mL | ORAL | Status: DC
  Administered 2020-06-28 – 2020-07-04 (×3): 1000 mL

## 2020-06-28 MED ORDER — POTASSIUM CHLORIDE 10 MEQ/100ML IV SOLN
10.0000 meq | INTRAVENOUS | Status: AC
  Administered 2020-06-28 (×4): 10 meq via INTRAVENOUS
  Filled 2020-06-28 (×4): qty 100

## 2020-06-28 MED ORDER — POTASSIUM CHLORIDE 20 MEQ PO PACK
40.0000 meq | PACK | Freq: Once | ORAL | Status: AC
  Administered 2020-06-28: 40 meq
  Filled 2020-06-28: qty 2

## 2020-06-28 NOTE — Progress Notes (Signed)
PHARMACY CONSULT NOTE - FOLLOW UP  Pharmacy Consult for Electrolyte Monitoring and Replacement   Recent Labs: Potassium (mmol/L)  Date Value  06/28/2020 2.6 (LL)  02/12/2013 3.9   Magnesium (mg/dL)  Date Value  19/62/2297 2.5 (H)   Calcium (mg/dL)  Date Value  98/92/1194 9.3   Calcium, Total (mg/dL)  Date Value  17/40/8144 8.7   Albumin (g/dL)  Date Value  81/85/6314 2.2 (L)  02/12/2013 3.6   Phosphorus (mg/dL)  Date Value  97/09/6376 2.6   Sodium (mmol/L)  Date Value  06/28/2020 154 (H)  02/12/2013 139   Assessment: 39 year old male here with encephalopathy s/t alcohol withdrawal. Patient intubated and sedated, requiring Dilaudid, propofol and Versed to maintain sedation. Patient on Librium and phenobarbital BID for alcohol withdrawal and to help facilitate weaning from vent. Pharmacy to manage electrolytes.  Goal of Therapy:  Electrolytes WNL  Plan:  K+ 3.8>3.4 > 2.6; will replete with KCL per tube x 1 and KCL IVPB IV q1h x 4 bags then recheck K+ after runs completed Other Electrolytes WNL  Wayland Denis ,PharmD Clinical Pharmacist 06/28/2020 4:51 AM

## 2020-06-28 NOTE — Progress Notes (Signed)
NAME:  Nicholas Valdez, MRN:  [id number], DOB:  07/20/1981, LOS: 9 ADMISSION DATE:  06/19/2020, CONSULTATION DATE:  06/20/2020 REFERRING MD:  Dr. Alanda Slim, CHIEF COMPLAINT:  Altered Mental Status   Brief History   39 yo male significant history of ETOH and polysubstance abuse presented to the ED with altered mental status admitted to the ICU requiring precedex drip and ultimately intubation & mechanical ventilation.  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 WBC doubled 11/17, concern for aspiration PNA, PCT pending, CXR inconclusive Now requiring vasopressors 06/22/20 Patient intubated and sedated, overnight RASS: -5 no acute events. Plan today to aggressively titrate down on versed drip, fentanyl drip discontinued 06/23/20- patient unable to have SBT today due to sedation. Despite decreasing versed to 7 and completely stopping Propofol patient is only partially arousable. Will continue to wean sedation to RASS-0 with plan for SBT. 06/24/20- patient is being weaned off sedation as possible. 06/25/20-issues with hypoxia and asynchrony, patient has been intermittently febrile Consults:    Procedures:  06/20/20 ETT >> 06/20/20 CVC 3 L >>  Significant Diagnostic Tests:  06/19/20 CT head >> no acute intracranial abnormality 06/20/20 US abdomen >> mild hepatic steatosis 06/21/20 Echocardiogram >> LVEF 65-70%, no wall motion abnormalities, no other abnormalities noted Micro Data:  06/19/20 COVID-19 >> negative 06/19/20 Influenza PCR A/B >> negative 06/20/20 MRSA PCR >> negative 06/20/20 tracheal aspirate >>  Antimicrobials:  06/21/20 Unasyn >>   CC Follow up resp failure  HPI Severe resp  failure Severe encephalopathy critically ill remains on vent          Objective   Blood pressure 132/81, pulse 62, temperature 99.7 F (37.6 C), resp. rate 20, height 5\' 10"  (1.778 m), weight (!) 158 kg, SpO2 100 %.    Vent Mode: PCV FiO2 (%):  [50 %-60 %] 50 % Set Rate:  [20 bmp] 20 bmp PEEP:  [12 cmH20] 12 cmH20 Plateau Pressure:  [25 cmH20-26 cmH20] 25 cmH20   Intake/Output Summary (Last 24 hours) at 06/28/2020 0751 Last data filed at 06/28/2020 0500 Gross per 24 hour  Intake 3420.45 ml  Output 1850 ml  Net 1570.45 ml   Filed Weights   06/23/20 0451 06/24/20 0500 06/28/20 0449  Weight: (!) 154.6 kg (!) 155.6 kg (!) 158 kg    REVIEW OF SYSTEMS  PATIENT IS UNABLE TO PROVIDE COMPLETE REVIEW OF SYSTEM S DUE TO SEVERE CRITICAL ILLNESS AND ENCEPHALOPATHY   PHYSICAL EXAMINATION:  GENERAL:critically ill appearing, +resp distress HEAD: Normocephalic, atraumatic.  EYES: Pupils equal, round, reactive to light.  No scleral icterus.  MOUTH: Moist mucosal membrane. NECK: Supple. No thyromegaly. No nodules. No JVD.  PULMONARY: +rhonchi, +wheezing CARDIOVASCULAR: S1 and S2. Regular rate and rhythm. No murmurs, rubs, or gallops.  GASTROINTESTINAL: Soft, nontender, -distended. Positive bowel sounds.  MUSCULOSKELETAL: No swelling, clubbing, or edema.  NEUROLOGIC: obtunded SKIN:intact,warm,dry  CBC    Component Value Date/Time   WBC 9.3 06/28/2020 0355   RBC 2.38 (L) 06/28/2020 0355   HGB 8.7 (L) 06/28/2020 0355   HGB 16.1 02/12/2013 2028   HCT 25.8 (L) 06/28/2020 0355   HCT 47.2 02/12/2013 2028   PLT 315 06/28/2020 0355   PLT 278 02/12/2013 2028   MCV 108.4 (H) 06/28/2020 0355   MCV  95 02/12/2013 2028   MCH 36.6 (H) 06/28/2020 0355   MCHC 33.7 06/28/2020 0355   RDW 15.4 06/28/2020 0355   RDW 13.4 02/12/2013 2028   LYMPHSABS 1.2 06/28/2020 0355   MONOABS 0.9 06/28/2020 0355   EOSABS 0.5 06/28/2020 0355   BASOSABS 0.0 06/28/2020 0355   BMP Latest Ref Rng &  Units 06/28/2020 06/27/2020 06/26/2020  Glucose 70 - 99 mg/dL 182(X) 937(J) 696(V)  BUN 6 - 20 mg/dL 89(F) 81(O) 17(P)  Creatinine 0.61 - 1.24 mg/dL 1.02(H) 8.52(D) 7.82(U)  Sodium 135 - 145 mmol/L 154(H) 152(H) 147(H)  Potassium 3.5 - 5.1 mmol/L 2.6(LL) 3.4(L) 3.8  Chloride 98 - 111 mmol/L 124(H) 119(H) 116(H)  CO2 22 - 32 mmol/L 20(L) 22 19(L)  Calcium 8.9 - 10.3 mg/dL 9.3 9.1 2.3(N)     Resolved Hospital Problem list     Assessment & Plan:  Acute Hypoxic Respiratory failure secondary to acute encephalopathy due to Alcohol withdrawal Suspected Aspiration Pneumonia  Severe ACUTE Hypoxic and Hypercapnic Respiratory Failure -continue Mechanical Ventilator support -continue Bronchodilator Therapy -Wean Fio2 and PEEP as tolerated -VAP/VENT bundle implementation 11/22-patient switched from [facility] to pressure control, due to asynchrony and reduce tidal volumes.  Repeat blood gas pending. - Ventilator settings: PC 8 mL/kg, 50% FiO2, 10 PEEP, continue ventilator support & lung protective strategies   ASPIRATION PNEUMONIA -Continue Unasyn for aspiration pneumonia coverage   NEURO Acute Encephalopathy due to ETOH withdrawal Hepatic Steatosis Polysubstance abuse Ammonia- trending down: 76 ~ 47 ~ 38 - Continue lactulose -Continue thiamine and folic acid - continue lactulose -Continue Librium 4 times daily -Continue gentle IV fluid resuscitation -CIWA protocol   ACUTE KIDNEY INJURY/Renal Failure -continue Foley Catheter-assess need -Avoid nephrotoxic agents -Follow urine output, BMP -Ensure adequate renal perfusion, optimize oxygenation -Renal dose medications   Sepsis with suspected Septic Shock due to suspected Aspiration Pneumonia Lactic: 3.3 ~ 2.5 ~ 2.1~1.1 PCT: 0.12~0.35~ 0.45~0.35 Increased secretions, FiO2 and pressor requirements overnight- 06/20/20 Off vasopressors since 11/18 -Trend PCT/follow-up cultures -Monitor WBC and fever curve -Continuous cardiac  monitoring -Continue Unasyn for aspiration pneumonia coverage    GI GI PROPHYLAXIS as indicated  NUTRITIONAL STATUS DIET-->TF's as tolerated Constipation protocol as indicated  ELECTROLYTES -follow labs as needed -replace as needed -pharmacy consultation and following   DVT/GI PRX ordered and assessed TRANSFUSIONS AS NEEDED MONITOR FSBS I Assessed the need for Labs I Assessed the need for Foley I Assessed the need for Central Venous Line Family Discussion when available I Assessed the need for Mobilization I made an Assessment of medications to be adjusted accordingly Safety Risk assessment completed  CASE DISCUSSED IN MULTIDISCIPLINARY ROUNDS WITH ICU TEAM   Critical Care Time devoted to patient care services described in this note is 54 minutes.   Overall, patient is critically ill, prognosis is guarded.  Patient with Multiorgan failure and at high risk for cardiac arrest and death.    Lucie Leather, M.D.  [facility]  Medical Director [facility] Medical Director [facility] Cardio-Pulmonary Department

## 2020-06-28 NOTE — Progress Notes (Signed)
PHARMACY CONSULT NOTE - FOLLOW UP  Pharmacy Consult for Electrolyte Monitoring and Replacement   Recent Labs: Potassium (mmol/L)  Date Value  06/28/2020 3.1 (L)  02/12/2013 3.9   Magnesium (mg/dL)  Date Value  40/03/6760 2.5 (H)   Calcium (mg/dL)  Date Value  95/04/3266 9.3   Calcium, Total (mg/dL)  Date Value  12/45/8099 8.7   Albumin (g/dL)  Date Value  83/38/2505 2.2 (L)  02/12/2013 3.6   Phosphorus (mg/dL)  Date Value  39/76/7341 2.6   Sodium (mmol/L)  Date Value  06/28/2020 154 (H)  02/12/2013 139   Assessment: 39 year old male here with encephalopathy s/t alcohol withdrawal. Patient intubated and sedated, requiring Dilaudid, propofol and Versed to maintain sedation. Patient on Librium and phenobarbital BID for alcohol withdrawal and to help facilitate weaning from vent. Pharmacy to manage electrolytes.  Goal of Therapy:  Electrolytes WNL  Plan:  On repeat K+ 2.6>3.1 after PO and IV in the AM ; will replete with KCL q4h per tube x 2 and check with AM labs. Other Electrolytes WNL this AM.  Martyn Malay ,PharmD Clinical Pharmacist 06/28/2020 2:20 PM

## 2020-06-28 NOTE — Progress Notes (Signed)
Nutrition Follow-up  DOCUMENTATION CODES:   Morbid obesity  INTERVENTION:  Initiate Vital AF 1.2 Cal at 45 mL/hr (1080 mL goal daily volume) + PROSource TF 90 mL 5 times daily per tube. Provides 1696 kcal, 191 grams of protein, 875 mL H2O daily.  Continue MVI daily per tube.  Patient remains at risk for refeeding syndrome. Recommend monitoring potassium, phosphorus, and magnesium daily, and to replete as needed.  NUTRITION DIAGNOSIS:   Inadequate oral intake related to inability to eat as evidenced by NPO status.  Ongoing.  GOAL:   Patient will meet greater than or equal to 90% of their needs  Met with TF regimen.  MONITOR:   Vent status, Labs, Weight trends, TF tolerance, I & O's  REASON FOR ASSESSMENT:   Ventilator, Consult Enteral/tube feeding initiation and management  ASSESSMENT:   39 year old male with PMHx of EtOH abuse, polysubstance abuse, hepatitis C admitted with AMS, acute encephalopathy, EtOH withdrawal, suspected aspiration PNA, AKI, sepsis.  11/16 intubated  Patient is currently intubated on ventilator support MV: 14.1 L/min Temp (24hrs), Avg:98.8 F (37.1 C), Min:97.9 F (36.6 C), Max:100 F (37.8 C)  Medications reviewed and include: Librium, free water 200 mL Q4hrs, Novolog 0-15 units Q4hrs, lactulose 20 grams BID, Reglan 5 mg Q12hrs IV, MVI daily, Protonix, thiamine 100 mg daily IV, Precedex gtt, Dilaudid gtt, Versed gtt, thiamine 500 mg daily IV.  Labs reviewed: CBG 111-123, Potassium 3.1, Sodium 154, CO2 20, BUN 65, Creatinine 2.35, Magnesium 2.5.  I/O: 1725 mL UOP yesterday (0.5 mL/kg/hr); 125 mL output from rectal tube  Weight trend: 158 kg on 11/24; +12.8 kg from 11/15  Enteral Access: 18 Fr. OGT placed 11/18; terminates in distal stomach per abdominal x-ray 11/21  TF regimen: Vital AF 1.2 Cal at 40 mL/hr + PROSource TF 90 mL 5 times daily per tube  Discussed with RN and on rounds. Patient tolerating tube feeds.  Diet Order:   Diet  Order            Diet NPO time specified  Diet effective now                EDUCATION NEEDS:   No education needs have been identified at this time  Skin:  Skin Assessment: Reviewed RN Assessment  Last BM:  06/28/2020 - type 7 per rectal tube  Height:   Ht Readings from Last 1 Encounters:  06/19/20 5' 10"  (1.778 m)   Weight:   Wt Readings from Last 1 Encounters:  06/28/20 (!) 158 kg   Ideal Body Weight:  75.5 kg  BMI:  Body mass index is 49.98 kg/m.  Estimated Nutritional Needs:   Kcal:  8101-7510 (11-14 kcal/kg)  Protein:  189 grams (2.5 grams/kg IBW)  Fluid:  >/= 2.2 L/day  Nicholas Barnacle, MS, RD, LDN Pager number available on Amion

## 2020-06-29 LAB — CBC WITH DIFFERENTIAL/PLATELET
Abs Immature Granulocytes: 0.23 10*3/uL — ABNORMAL HIGH (ref 0.00–0.07)
Basophils Absolute: 0 10*3/uL (ref 0.0–0.1)
Basophils Relative: 0 %
Eosinophils Absolute: 0.5 10*3/uL (ref 0.0–0.5)
Eosinophils Relative: 6 %
HCT: 26.7 % — ABNORMAL LOW (ref 39.0–52.0)
Hemoglobin: 8.8 g/dL — ABNORMAL LOW (ref 13.0–17.0)
Immature Granulocytes: 3 %
Lymphocytes Relative: 15 %
Lymphs Abs: 1.3 10*3/uL (ref 0.7–4.0)
MCH: 35.9 pg — ABNORMAL HIGH (ref 26.0–34.0)
MCHC: 33 g/dL (ref 30.0–36.0)
MCV: 109 fL — ABNORMAL HIGH (ref 80.0–100.0)
Monocytes Absolute: 0.8 10*3/uL (ref 0.1–1.0)
Monocytes Relative: 9 %
Neutro Abs: 5.6 10*3/uL (ref 1.7–7.7)
Neutrophils Relative %: 67 %
Platelets: 350 10*3/uL (ref 150–400)
RBC: 2.45 MIL/uL — ABNORMAL LOW (ref 4.22–5.81)
RDW: 15.6 % — ABNORMAL HIGH (ref 11.5–15.5)
WBC: 8.3 10*3/uL (ref 4.0–10.5)
nRBC: 1 % — ABNORMAL HIGH (ref 0.0–0.2)

## 2020-06-29 LAB — COMPREHENSIVE METABOLIC PANEL
ALT: 54 U/L — ABNORMAL HIGH (ref 0–44)
AST: 88 U/L — ABNORMAL HIGH (ref 15–41)
Albumin: 2.3 g/dL — ABNORMAL LOW (ref 3.5–5.0)
Alkaline Phosphatase: 49 U/L (ref 38–126)
Anion gap: 7 (ref 5–15)
BUN: 60 mg/dL — ABNORMAL HIGH (ref 6–20)
CO2: 20 mmol/L — ABNORMAL LOW (ref 22–32)
Calcium: 9.3 mg/dL (ref 8.9–10.3)
Chloride: 126 mmol/L — ABNORMAL HIGH (ref 98–111)
Creatinine, Ser: 1.97 mg/dL — ABNORMAL HIGH (ref 0.61–1.24)
GFR, Estimated: 44 mL/min — ABNORMAL LOW (ref >60)
Glucose, Bld: 127 mg/dL — ABNORMAL HIGH (ref 70–99)
Potassium: 3.5 mmol/L (ref 3.5–5.1)
Sodium: 153 mmol/L — ABNORMAL HIGH (ref 135–145)
Total Bilirubin: 0.4 mg/dL (ref 0.3–1.2)
Total Protein: 6.5 g/dL (ref 6.5–8.1)

## 2020-06-29 LAB — MAGNESIUM: Magnesium: 2.2 mg/dL (ref 1.7–2.4)

## 2020-06-29 LAB — GLUCOSE, CAPILLARY
Glucose-Capillary: 120 mg/dL — ABNORMAL HIGH (ref 70–99)
Glucose-Capillary: 116 mg/dL — ABNORMAL HIGH (ref 70–99)
Glucose-Capillary: 109 mg/dL — ABNORMAL HIGH (ref 70–99)
Glucose-Capillary: 125 mg/dL — ABNORMAL HIGH (ref 70–99)
Glucose-Capillary: 118 mg/dL — ABNORMAL HIGH (ref 70–99)

## 2020-06-29 LAB — PHOSPHORUS: Phosphorus: 3.3 mg/dL (ref 2.5–4.6)

## 2020-06-29 MED ORDER — POTASSIUM CHLORIDE 20 MEQ PO PACK
20.0000 meq | PACK | Freq: Once | ORAL | Status: AC
  Administered 2020-06-29: 20 meq
  Filled 2020-06-29: qty 1

## 2020-06-29 NOTE — Progress Notes (Signed)
NAME:  Nicholas Valdez, MRN:  [id number], DOB:  Aug 15, 1980, LOS: 10 ADMISSION DATE:  06/19/2020, CONSULTATION DATE:  06/20/2020 REFERRING MD:  Dr. Alanda Slim, CHIEF COMPLAINT:  Altered Mental Status   Brief History   39 yo male significant history of ETOH and polysubstance abuse presented to the ED with altered mental status admitted to the ICU requiring precedex drip and ultimately intubation & mechanical ventilation.  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 WBC doubled 11/17, concern for aspiration PNA, PCT pending, CXR inconclusive Now requiring vasopressors 06/22/20 Patient intubated and sedated, overnight RASS: -5 no acute events. Plan today to aggressively titrate down on versed drip, fentanyl drip discontinued 06/23/20- patient unable to have SBT today due to sedation. Despite decreasing versed to 7 and completely stopping Propofol patient is only partially arousable. Will continue to wean sedation to RASS-0 with plan for SBT. 06/24/20- patient is being weaned off sedation as possible. 06/25/20-issues with hypoxia and asynchrony, patient has been intermittently febrile Consults:    Procedures:  06/20/20 ETT >> 06/20/20 CVC 3 L >>  Significant Diagnostic Tests:  06/19/20 CT head >> no acute intracranial abnormality 06/20/20 US abdomen >> mild hepatic steatosis 06/21/20 Echocardiogram >> LVEF 65-70%, no wall motion abnormalities, no other abnormalities noted Micro Data:  06/19/20 COVID-19 >> negative 06/19/20 Influenza PCR A/B >> negative 06/20/20 MRSA PCR >> negative 06/20/20 tracheal aspirate >>  Antimicrobials:  06/21/20 Unasyn >>   CC Follow up Resp failure  HPI Severe resp  failure Severe Encephalopathy Remains critically ill Remains on vent Failure to wean          Objective   Blood pressure 136/86, pulse (!) 58, temperature 98.1 F (36.7 C), resp. rate 20, height 5\' 10"  (1.778 m), weight (!) 159 kg, SpO2 96 %.    Vent Mode: PCV FiO2 (%):  [35 %-40 %] 35 % Set Rate:  [16 bmp-20 bmp] 16 bmp PEEP:  [5 cmH20-10 cmH20] 5 cmH20   Intake/Output Summary (Last 24 hours) at 06/29/2020 07/01/2020 Last data filed at 06/29/2020 0400 Gross per 24 hour  Intake 2407.5 ml  Output 3025 ml  Net -617.5 ml   Filed Weights   06/24/20 0500 06/28/20 0449 06/29/20 0406  Weight: (!) 155.6 kg (!) 158 kg (!) 159 kg    REVIEW OF SYSTEMS  PATIENT IS UNABLE TO PROVIDE COMPLETE REVIEW OF SYSTEM S DUE TO SEVERE CRITICAL ILLNESS AND ENCEPHALOPATHY   PHYSICAL EXAMINATION:  GENERAL:critically ill appearing, +resp distress HEAD: Normocephalic, atraumatic.  EYES: Pupils equal, round, reactive to light.  No scleral icterus.  MOUTH: Moist mucosal membrane. NECK: Supple. No thyromegaly. No nodules. No JVD.  PULMONARY: +rhonchi, +wheezing CARDIOVASCULAR: S1 and S2. Regular rate and rhythm. No murmurs, rubs, or gallops.  GASTROINTESTINAL: Soft, nontender, -distended. Positive bowel sounds.  MUSCULOSKELETAL: No swelling, clubbing, or edema.  NEUROLOGIC: obtunded SKIN:intact,warm,dry    CBC    Component Value Date/Time   WBC 8.3 06/29/2020 0100   RBC 2.45 (L) 06/29/2020 0100   HGB 8.8 (L) 06/29/2020 0100   HGB 16.1 02/12/2013 2028   HCT 26.7 (L) 06/29/2020 0100   HCT 47.2 02/12/2013 2028   PLT 350 06/29/2020 0100   PLT 278 02/12/2013 2028   MCV 109.0 (H) 06/29/2020 0100  MCV 95 02/12/2013 2028   MCH 35.9 (H) 06/29/2020 0100   MCHC 33.0 06/29/2020 0100   RDW 15.6 (H) 06/29/2020 0100   RDW 13.4 02/12/2013 2028   LYMPHSABS 1.3 06/29/2020 0100   MONOABS 0.8 06/29/2020 0100   EOSABS 0.5 06/29/2020 0100   BASOSABS 0.0 06/29/2020 0100   BMP Latest Ref Rng & Units  06/29/2020 06/28/2020 06/28/2020  Glucose 70 - 99 mg/dL 841(Y) - 606(T)  BUN 6 - 20 mg/dL 01(S) - 01(U)  Creatinine 0.61 - 1.24 mg/dL 9.32(T) - 5.57(D)  Sodium 135 - 145 mmol/L 153(H) - 154(H)  Potassium 3.5 - 5.1 mmol/L 3.5 3.1(L) 2.6(LL)  Chloride 98 - 111 mmol/L 126(H) - 124(H)  CO2 22 - 32 mmol/L 20(L) - 20(L)  Calcium 8.9 - 10.3 mg/dL 9.3 - 9.3     Resolved Hospital Problem list     Assessment & Plan:  Acute Hypoxic Respiratory failure secondary to acute encephalopathy due to Alcohol withdrawal with Aspiration Pneumonia with brain damage due to ETOH abuse and DT's   Severe ACUTE Hypoxic and Hypercapnic Respiratory Failure -continue Mechanical Ventilator support -continue Bronchodilator Therapy -Wean Fio2 and PEEP as tolerated -VAP/VENT bundle implementation Unable to wean due to severe encephalopathy Unable to protect airway   ASPIRATION PNEUMONIA -Continue Unasyn for aspiration pneumonia coverage   NEUROLOGY Acute toxic metabolic encephalopathy, need for sedation Goal RASS -2 to -3 Acute Encephalopathy due to ETOH withdrawal Hepatic Steatosis -Continue thiamine and folic acid Polysubstance abuse  Liver dysfunction - Continue lactulose - continue lactulose -Continue Librium 4 times daily -Continue gentle IV fluid resuscitation -CIWA protocol   ACUTE KIDNEY INJURY/Renal Failure -continue Foley Catheter-assess need -Avoid nephrotoxic agents -Follow urine output, BMP -Ensure adequate renal perfusion, optimize oxygenation -Renal dose medications  Severe SEPSIS  Septic Shock due to suspected Aspiration Pneumonia Continue Unasyn for aspiration pneumonia coverage   GI GI PROPHYLAXIS as indicated  NUTRITIONAL STATUS DIET-->TF's as tolerated Constipation protocol as indicated ENDO - ICU hypoglycemic\Hyperglycemia protocol -check FSBS per protocol  ELECTROLYTES -follow labs as needed -replace as needed -pharmacy consultation and  following   DVT/GI PRX ordered and assessed TRANSFUSIONS AS NEEDED MONITOR FSBS I Assessed the need for Labs I Assessed the need for Foley I Assessed the need for Central Venous Line Family Discussion when available I Assessed the need for Mobilization I made an Assessment of medications to be adjusted accordingly Safety Risk assessment completed  CASE DISCUSSED IN MULTIDISCIPLINARY ROUNDS WITH ICU TEAM     Critical Care Time devoted to patient care services described in this note is 54 minutes.   Overall, patient is critically ill, prognosis is guarded.  Patient with Multiorgan failure and at high risk for cardiac arrest and death.    Lucie Leather, M.D.  [facility]  Medical Director [facility] Medical Director [facility] Cardio-Pulmonary Department

## 2020-06-29 NOTE — Progress Notes (Addendum)
PHARMACY CONSULT NOTE - FOLLOW UP  Pharmacy Consult for Electrolyte Monitoring and Replacement   Recent Labs: Potassium (mmol/L)  Date Value  06/29/2020 3.5  02/12/2013 3.9   Magnesium (mg/dL)  Date Value  58/85/0277 2.2   Calcium (mg/dL)  Date Value  41/28/7867 9.3   Calcium, Total (mg/dL)  Date Value  67/20/9470 8.7   Albumin (g/dL)  Date Value  96/28/3662 2.3 (L)  02/12/2013 3.6   Phosphorus (mg/dL)  Date Value  94/76/5465 3.3   Sodium (mmol/L)  Date Value  06/29/2020 153 (H)  02/12/2013 139   Assessment: 39 year old male here with encephalopathy s/t alcohol withdrawal. Patient intubated and sedated, requiring Dilaudid, propofol and Versed to maintain sedation. Patient on Librium and phenobarbital BID for alcohol withdrawal and to help facilitate weaning from vent. Pharmacy to manage electrolytes.  Goal of Therapy:  Electrolytes WNL  Plan:  KCl 20 mEq x 1 per Tube.   F/u with AM labs.   Ronnald Ramp ,PharmD Clinical Pharmacist 06/29/2020 7:41 AM

## 2020-06-30 ENCOUNTER — Inpatient Hospital Stay: Payer: Medicaid Other

## 2020-06-30 LAB — COMPREHENSIVE METABOLIC PANEL
ALT: 56 U/L — ABNORMAL HIGH (ref 0–44)
AST: 66 U/L — ABNORMAL HIGH (ref 15–41)
Albumin: 2.2 g/dL — ABNORMAL LOW (ref 3.5–5.0)
Alkaline Phosphatase: 45 U/L (ref 38–126)
Anion gap: 11 (ref 5–15)
BUN: 55 mg/dL — ABNORMAL HIGH (ref 6–20)
CO2: 20 mmol/L — ABNORMAL LOW (ref 22–32)
Calcium: 9.4 mg/dL (ref 8.9–10.3)
Chloride: 125 mmol/L — ABNORMAL HIGH (ref 98–111)
Creatinine, Ser: 1.66 mg/dL — ABNORMAL HIGH (ref 0.61–1.24)
GFR, Estimated: 54 mL/min — ABNORMAL LOW (ref >60)
Glucose, Bld: 121 mg/dL — ABNORMAL HIGH (ref 70–99)
Potassium: 3.5 mmol/L (ref 3.5–5.1)
Sodium: 156 mmol/L — ABNORMAL HIGH (ref 135–145)
Total Bilirubin: 0.3 mg/dL (ref 0.3–1.2)
Total Protein: 6.6 g/dL (ref 6.5–8.1)

## 2020-06-30 LAB — CBC WITH DIFFERENTIAL/PLATELET
Abs Immature Granulocytes: 0.12 10*3/uL — ABNORMAL HIGH (ref 0.00–0.07)
Basophils Absolute: 0 10*3/uL (ref 0.0–0.1)
Basophils Relative: 0 %
Eosinophils Absolute: 0.4 10*3/uL (ref 0.0–0.5)
Eosinophils Relative: 5 %
HCT: 26.6 % — ABNORMAL LOW (ref 39.0–52.0)
Hemoglobin: 8.8 g/dL — ABNORMAL LOW (ref 13.0–17.0)
Immature Granulocytes: 1 %
Lymphocytes Relative: 14 %
Lymphs Abs: 1.2 10*3/uL (ref 0.7–4.0)
MCH: 36.7 pg — ABNORMAL HIGH (ref 26.0–34.0)
MCHC: 33.1 g/dL (ref 30.0–36.0)
MCV: 110.8 fL — ABNORMAL HIGH (ref 80.0–100.0)
Monocytes Absolute: 0.6 10*3/uL (ref 0.1–1.0)
Monocytes Relative: 7 %
Neutro Abs: 6 10*3/uL (ref 1.7–7.7)
Neutrophils Relative %: 73 %
Platelets: 360 10*3/uL (ref 150–400)
RBC: 2.4 MIL/uL — ABNORMAL LOW (ref 4.22–5.81)
RDW: 15.5 % (ref 11.5–15.5)
Smear Review: NORMAL
WBC: 8.4 10*3/uL (ref 4.0–10.5)
nRBC: 0.5 % — ABNORMAL HIGH (ref 0.0–0.2)

## 2020-06-30 LAB — GLUCOSE, CAPILLARY
Glucose-Capillary: 107 mg/dL — ABNORMAL HIGH (ref 70–99)
Glucose-Capillary: 111 mg/dL — ABNORMAL HIGH (ref 70–99)
Glucose-Capillary: 113 mg/dL — ABNORMAL HIGH (ref 70–99)
Glucose-Capillary: 115 mg/dL — ABNORMAL HIGH (ref 70–99)
Glucose-Capillary: 117 mg/dL — ABNORMAL HIGH (ref 70–99)
Glucose-Capillary: 120 mg/dL — ABNORMAL HIGH (ref 70–99)
Glucose-Capillary: 91 mg/dL (ref 70–99)

## 2020-06-30 LAB — MAGNESIUM: Magnesium: 2.2 mg/dL (ref 1.7–2.4)

## 2020-06-30 LAB — PHOSPHORUS: Phosphorus: 4.2 mg/dL (ref 2.5–4.6)

## 2020-06-30 MED ORDER — DEXTROSE 5 % IV SOLN: INTRAVENOUS | Status: DC

## 2020-06-30 MED ORDER — CHLORDIAZEPOXIDE HCL 25 MG PO CAPS
25.0000 mg | ORAL_CAPSULE | Freq: Three times a day (TID) | ORAL | Status: DC
  Administered 2020-06-30 – 2020-07-07 (×21): 25 mg
  Filled 2020-06-30 (×21): qty 1

## 2020-06-30 MED ORDER — POTASSIUM CHLORIDE 20 MEQ PO PACK
20.0000 meq | PACK | Freq: Once | ORAL | Status: AC
  Administered 2020-06-30: 20 meq
  Filled 2020-06-30: qty 1

## 2020-06-30 NOTE — Progress Notes (Signed)
PHARMACY CONSULT NOTE - FOLLOW UP  Pharmacy Consult for Electrolyte Monitoring and Replacement   Recent Labs: Potassium (mmol/L)  Date Value  06/30/2020 3.5  02/12/2013 3.9   Magnesium (mg/dL)  Date Value  56/70/1410 2.2   Calcium (mg/dL)  Date Value  30/13/1438 9.4   Calcium, Total (mg/dL)  Date Value  88/75/7972 8.7   Albumin (g/dL)  Date Value  82/01/155 2.2 (L)  02/12/2013 3.6   Phosphorus (mg/dL)  Date Value  15/37/9432 4.2   Sodium (mmol/L)  Date Value  06/30/2020 156 (H)  02/12/2013 139   Assessment: 39 year old male here with encephalopathy s/t alcohol withdrawal. Patient intubated and sedated, requiring Dilaudid, propofol and Versed to maintain sedation. Patient on Librium and phenobarbital BID for alcohol withdrawal and to help facilitate weaning from vent. Pharmacy to manage electrolytes.  Goal of Therapy:  Electrolytes WNL  Plan:  Stable at LLN, Will  Give KCl 20 mEq x 1 per Tube.   F/u with AM labs.   Martyn Malay ,PharmD Clinical Pharmacist 06/30/2020 7:19 AM

## 2020-06-30 NOTE — Progress Notes (Signed)
CRITICAL CARE NOTE 39 yo male significant history of ETOH and polysubstance abuse presented to the ED with altered mental status admitted to the ICU requiring precedex drip and ultimately intubation & mechanical ventilation.  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 WBC doubled 11/17, concern for aspiration PNA, PCT pending, CXR inconclusive Now requiring vasopressors 06/22/20 Patient intubated and sedated, overnight RASS: -5 no acute events. Plan today to aggressively titrate down on versed drip, fentanyl drip discontinued 06/23/20-patient unable to have SBT today due to sedation. Despite decreasing versed to 7 and completely stopping Propofol patient is only partially arousable. Will continue to wean sedation to RASS-0 with plan for SBT. 06/24/20- patient is being weaned off sedation as possible. 06/25/20-issues with hypoxia and asynchrony, patient has been intermittently febrile 11/25 severe hypoxia, resp failure, failure to wean from vent Consults:    Procedures:  06/20/20 ETT >> 06/20/20 CVC 3 L >>  Significant Diagnostic Tests:  06/19/20 CT head >> no acute intracranial abnormality 06/20/20 US abdomen >> mild hepatic steatosis 06/21/20 Echocardiogram >> LVEF 65-70%, no wall motion abnormalities, no other abnormalities noted Micro Data:  06/19/20 COVID-19 >> negative 06/19/20 Influenza PCR A/B >> negative 06/20/20 MRSA PCR >> negative 06/20/20 tracheal aspirate >>  Antimicrobials:  06/21/20 Unasyn >>   CC  follow up respiratory failure  SUBJECTIVE Patient remains critically ill Prognosis is guarded   Vent Mode: PCV FiO2 (%):  [28 %-35 %] 28 % Set Rate:  [20 bmp] 20 bmp PEEP:  [5  cmH20] 5 cmH20   BP 120/81   Pulse (!) 55   Temp (!) 97.3 F (36.3 C)   Resp 20   Ht _0  (1.778 m)   Wt (!) 159.2 kg   SpO2 99%   BMI 50.36 kg/m    I/O last 3 completed shifts: In: 7237.7 [I.V.:2053.5; NG/GT:5134.1; IV Piggyback:50] Out: 4174 [Urine:4500; Stool:1000] No intake/output data recorded.  SpO2: 99 % FiO2 (%): 28 %  Estimated body mass index is 50.36 kg/m as calculated from the following:   Height as of this encounter: _1  (1.778 m).   Weight as of this encounter: 159.2 kg.  SIGNIFICANT EVENTS   REVIEW OF SYSTEMS  PATIENT IS UNABLE TO PROVIDE COMPLETE REVIEW OF SYSTEMS DUE TO SEVERE CRITICAL ILLNESS        PHYSICAL EXAMINATION:  GENERAL:critically ill appearing, +resp distress HEAD: Normocephalic, atraumatic.  EYES: Pupils equal, round, reactive to light.  No scleral icterus.  MOUTH: Moist mucosal membrane. NECK: Supple.  PULMONARY: +rhonchi, +wheezing CARDIOVASCULAR: S1 and S2. Regular rate and rhythm. No murmurs, rubs, or gallops.  GASTROINTESTINAL: Soft, nontender, -distended.  Positive bowel sounds.   MUSCULOSKELETAL: No swelling, clubbing, or edema.  NEUROLOGIC: obtunded, GCS<8 SKIN:intact,warm,dry  MEDICATIONS: I have reviewed all medications and confirmed regimen as documented   CULTURE RESULTS   Recent Results (from the past 240 hour(s))  MRSA PCR Screening     Status: None   Collection Time: 06/20/20  9:35 AM   Specimen: Nasopharyngeal  Result Value Ref Range Status   MRSA by PCR NEGATIVE NEGATIVE Final    Comment:        The GeneXpert MRSA Assay (FDA approved for NASAL specimens only), is one component of a comprehensive MRSA colonization surveillance program.  It is not intended to diagnose MRSA infection nor to guide or monitor treatment for MRSA infections. Performed at [facility], [address] [place], [place] [address]   Culture, respiratory     Status: None   Collection Time: 06/22/20 11:24 AM    Specimen: Tracheal Aspirate; Respiratory  Result Value Ref Range Status   Specimen Description   Final    TRACHEAL ASPIRATE Performed at [facility], [address] [place], [place] [address]    Special Requests   Final    NONE Performed at [facility], [address] [place], [place] [address]    Gram Stain   Final    ABUNDANT WBC PRESENT, PREDOMINANTLY PMN NO ORGANISMS SEEN    Culture   Final    NO GROWTH 2 DAYS Performed at [facility], [address], [place], [place] [address]    Report Status 06/24/2020 FINAL  Final  Culture, respiratory     Status: None   Collection Time: 06/25/20  2:45 AM   Specimen: Tracheal Aspirate; Respiratory  Result Value Ref Range Status   Specimen Description   Final    TRACHEAL ASPIRATE Performed at [facility], [address], [place], [place] [address]    Special Requests   Final    NONE Performed at [facility], [address] [place], [place] [address]    Gram Stain   Final    RARE WBC PRESENT, PREDOMINANTLY PMN NO ORGANISMS SEEN    Culture   Final    RARE Normal respiratory flora-no Staph aureus or Pseudomonas seen Performed at [facility] [address], [place], [place] [address]    Report Status 06/27/2020 FINAL  Final      CBC    Component Value Date/Time   WBC 8.4 06/30/2020 0424   RBC 2.40 (L) 06/30/2020 0424   HGB 8.8 (L) 06/30/2020 0424   HGB 16.1 02/12/2013 2028   HCT 26.6 (L) 06/30/2020 0424   HCT 47.2 02/12/2013 2028   PLT 360 06/30/2020 0424   PLT 278 02/12/2013 2028   MCV 110.8 (H) 06/30/2020 0424   MCV 95 02/12/2013 2028   MCH 36.7 (H) 06/30/2020 0424   MCHC 33.1 06/30/2020 0424   RDW 15.5 06/30/2020 0424   RDW 13.4 02/12/2013 2028   LYMPHSABS 1.2 06/30/2020 0424   MONOABS 0.6 06/30/2020 0424   EOSABS 0.4 06/30/2020 0424   BASOSABS 0.0 06/30/2020 0424   BMP Latest Ref Rng & Units 06/30/2020 06/29/2020 06/28/2020  Glucose 70 - 99 mg/dL  121(H) 127(H) -  BUN 6 - 20 mg/dL 55(H) 60(H) -  Creatinine 0.61 - 1.24 mg/dL 1.66(H) 1.97(H) -  Sodium 135 - 145 mmol/L 156(H) 153(H) -  Potassium 3.5 - 5.1 mmol/L 3.5 3.5 3.1(L)  Chloride 98 - 111 mmol/L 125(H) 126(H) -  CO2 22 - 32 mmol/L 20(L) 20(L) -  Calcium 8.9 - 10.3 mg/dL 9.4 9.3 -        IMAGING    No results found.   Nutrition Status: Nutrition Problem: Inadequate oral intake Etiology: inability to eat Signs/Symptoms: NPO status Interventions: Tube feeding, Prostat, MVI     Indwelling Urinary Catheter continued, requirement due to   Reason to continue Indwelling Urinary Catheter strict Intake/Output monitoring for hemodynamic instability   Central Line/ continued, requirement due to  Reason to continue [place] of central venous pressure or other hemodynamic parameters and poor IV access   Ventilator continued, requirement  due to severe respiratory failure   Ventilator Sedation RASS 0 to -2      ASSESSMENT AND PLAN SYNOPSIS Acute Hypoxic Respiratory failure secondary to acute encephalopathy due to Alcohol withdrawal with Aspiration Pneumonia with brain damage due to ETOH abuse and DT's   Severe ACUTE Hypoxic and Hypercapnic Respiratory Failure -continue Full MV support -continue Bronchodilator Therapy -Wean Fio2 and PEEP as tolerated -will perform SAT/SBT when respiratory parameters are met -VAP/VENT bundle implementation  ACUTE DIASTOLIC CARDIAC FAILURE-  -oxygen as needed -Lasix as tolerated  Morbid obesity, possible OSA.   Will certainly impact respiratory mechanics, ventilator weaning Suspect will need to consider additional PEEP  ACUTE KIDNEY INJURY/Renal Failure -continue Foley Catheter-assess need -Avoid nephrotoxic agents -Follow urine output, BMP -Ensure adequate renal perfusion, optimize oxygenation -Renal dose medications     NEUROLOGY Acute toxic metabolic encephalopathy, need for sedation Goal RASS -2 to  -3  CARDIAC ICU monitoring  ID -continue IV abx as prescibed -follow up cultures  GI GI PROPHYLAXIS as indicated   DIET-->TF's as tolerated Constipation protocol as indicated  ENDO - will use ICU hypoglycemic\Hyperglycemia protocol if indicated     ELECTROLYTES -follow labs as needed -replace as needed -pharmacy consultation and following   DVT/GI PRX ordered and assessed TRANSFUSIONS AS NEEDED MONITOR FSBS I Assessed the need for Labs I Assessed the need for Foley I Assessed the need for Central Venous Line Family Discussion when available I Assessed the need for Mobilization I made an Assessment of medications to be adjusted accordingly Safety Risk assessment completed   CASE DISCUSSED IN MULTIDISCIPLINARY ROUNDS WITH ICU TEAM  Critical Care Time devoted to patient care services described in this note is 46 minutes.   Overall, patient is critically ill, prognosis is guarded.     Corrin Parker, M.D.  [facility]  Medical Director [place] Director [facility] Cardio-Pulmonary Department

## 2020-07-01 DIAGNOSIS — J9601 Acute respiratory failure with hypoxia: Secondary | ICD-10-CM | POA: Diagnosis not present

## 2020-07-01 DIAGNOSIS — G9341 Metabolic encephalopathy: Secondary | ICD-10-CM | POA: Diagnosis not present

## 2020-07-01 DIAGNOSIS — F10231 Alcohol dependence with withdrawal delirium: Secondary | ICD-10-CM | POA: Diagnosis not present

## 2020-07-01 LAB — CBC WITH DIFFERENTIAL/PLATELET
Abs Immature Granulocytes: 0.14 10*3/uL — ABNORMAL HIGH (ref 0.00–0.07)
Basophils Absolute: 0 10*3/uL (ref 0.0–0.1)
Basophils Relative: 0 %
Eosinophils Absolute: 0.5 10*3/uL (ref 0.0–0.5)
Eosinophils Relative: 5 %
HCT: 27 % — ABNORMAL LOW (ref 39.0–52.0)
Hemoglobin: 8.6 g/dL — ABNORMAL LOW (ref 13.0–17.0)
Immature Granulocytes: 2 %
Lymphocytes Relative: 19 %
Lymphs Abs: 1.8 10*3/uL (ref 0.7–4.0)
MCH: 35.5 pg — ABNORMAL HIGH (ref 26.0–34.0)
MCHC: 31.9 g/dL (ref 30.0–36.0)
MCV: 111.6 fL — ABNORMAL HIGH (ref 80.0–100.0)
Monocytes Absolute: 0.6 10*3/uL (ref 0.1–1.0)
Monocytes Relative: 7 %
Neutro Abs: 6.3 10*3/uL (ref 1.7–7.7)
Neutrophils Relative %: 67 %
Platelets: 342 10*3/uL (ref 150–400)
RBC: 2.42 MIL/uL — ABNORMAL LOW (ref 4.22–5.81)
RDW: 15.5 % (ref 11.5–15.5)
Smear Review: NORMAL
WBC: 9.4 10*3/uL (ref 4.0–10.5)
nRBC: 0.3 % — ABNORMAL HIGH (ref 0.0–0.2)

## 2020-07-01 LAB — COMPREHENSIVE METABOLIC PANEL
ALT: 54 U/L — ABNORMAL HIGH (ref 0–44)
AST: 53 U/L — ABNORMAL HIGH (ref 15–41)
Albumin: 2.2 g/dL — ABNORMAL LOW (ref 3.5–5.0)
Alkaline Phosphatase: 45 U/L (ref 38–126)
Anion gap: 9 (ref 5–15)
BUN: 50 mg/dL — ABNORMAL HIGH (ref 6–20)
CO2: 19 mmol/L — ABNORMAL LOW (ref 22–32)
Calcium: 9.3 mg/dL (ref 8.9–10.3)
Chloride: 124 mmol/L — ABNORMAL HIGH (ref 98–111)
Creatinine, Ser: 1.72 mg/dL — ABNORMAL HIGH (ref 0.61–1.24)
GFR, Estimated: 52 mL/min — ABNORMAL LOW (ref >60)
Glucose, Bld: 122 mg/dL — ABNORMAL HIGH (ref 70–99)
Potassium: 3.4 mmol/L — ABNORMAL LOW (ref 3.5–5.1)
Sodium: 152 mmol/L — ABNORMAL HIGH (ref 135–145)
Total Bilirubin: 0.4 mg/dL (ref 0.3–1.2)
Total Protein: 6.5 g/dL (ref 6.5–8.1)

## 2020-07-01 LAB — GLUCOSE, CAPILLARY
Glucose-Capillary: 107 mg/dL — ABNORMAL HIGH (ref 70–99)
Glucose-Capillary: 110 mg/dL — ABNORMAL HIGH (ref 70–99)
Glucose-Capillary: 116 mg/dL — ABNORMAL HIGH (ref 70–99)
Glucose-Capillary: 118 mg/dL — ABNORMAL HIGH (ref 70–99)
Glucose-Capillary: 119 mg/dL — ABNORMAL HIGH (ref 70–99)
Glucose-Capillary: 126 mg/dL — ABNORMAL HIGH (ref 70–99)

## 2020-07-01 LAB — MAGNESIUM: Magnesium: 2.1 mg/dL (ref 1.7–2.4)

## 2020-07-01 LAB — PHOSPHORUS: Phosphorus: 5.4 mg/dL — ABNORMAL HIGH (ref 2.5–4.6)

## 2020-07-01 MED ORDER — POTASSIUM CHLORIDE 20 MEQ PO PACK
40.0000 meq | PACK | Freq: Once | ORAL | Status: AC
  Administered 2020-07-01: 40 meq
  Filled 2020-07-01: qty 2

## 2020-07-01 MED ORDER — FREE WATER
200.0000 mL | Status: DC
  Administered 2020-07-01 – 2020-07-02 (×15): 200 mL

## 2020-07-01 NOTE — Progress Notes (Signed)
CRITICAL CARE NOTE 39 yo male significant history of ETOH and polysubstance abuse presented to the ED with altered mental status admitted to the ICU requiring precedex drip and ultimately intubation & mechanical ventilation.  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 WBC doubled 11/17, concern for aspiration PNA, PCT pending, CXR inconclusive Now requiring vasopressors 06/22/20 Patient intubated and sedated, overnight RASS: -5 no acute events. Plan today to aggressively titrate down on versed drip, fentanyl drip discontinued 06/23/20-patient unable to have SBT today due to sedation. Despite decreasing versed to 7 and completely stopping Propofol patient is only partially arousable. Will continue to wean sedation to RASS-0 with plan for SBT. 06/24/20- patient is being weaned off sedation as possible. 06/25/20-issues with hypoxia and asynchrony, patient has been intermittently febrile 11/25 severe hypoxia, resp failure, failure to wean from vent Consults:    Procedures:  06/20/20 ETT >> 06/20/20 CVC 3 L >>  Significant Diagnostic Tests:  06/19/20 CT head >> no acute intracranial abnormality 06/20/20 US abdomen>>mild hepatic steatosis 06/21/20 Echocardiogram>>LVEF 65-70%, no wall motion abnormalities, no other abnormalities noted Micro Data:  06/19/20 COVID-19 >> negative 06/19/20 Influenza PCR A/B >> negative 06/20/20 MRSA PCR >> negative 06/20/20 tracheal aspirate >>NG  Antimicrobials:  06/21/20 Unasyn >>11/25  CC  follow up respiratory failure  SUBJECTIVE Patient remains critically ill Prognosis is guarded  Vent Mode: PCV FiO2 (%):  [28 %-35 %] 35 % Set Rate:  [20 bmp] 20 bmp PEEP:   [5 cmH20] 5 cmH20 Plateau Pressure:  [25 cmH20] 25 cmH20   CBC    Component Value Date/Time   WBC 9.4 07/01/2020 0524   RBC 2.42 (L) 07/01/2020 0524   HGB 8.6 (L) 07/01/2020 0524   HGB 16.1 02/12/2013 2028   HCT 27.0 (L) 07/01/2020 0524   HCT 47.2 02/12/2013 2028   PLT 342 07/01/2020 0524   PLT 278 02/12/2013 2028   MCV 111.6 (H) 07/01/2020 0524   MCV 95 02/12/2013 2028   MCH 35.5 (H) 07/01/2020 0524   MCHC 31.9 07/01/2020 0524   RDW 15.5 07/01/2020 0524   RDW 13.4 02/12/2013 2028   LYMPHSABS 1.8 07/01/2020 0524   MONOABS 0.6 07/01/2020 0524   EOSABS 0.5 07/01/2020 0524   BASOSABS 0.0 07/01/2020 0524   BMP Latest Ref Rng & Units 07/01/2020 06/30/2020 06/29/2020  Glucose 70 - 99 mg/dL 122(H) 121(H) 127(H)  BUN 6 - 20 mg/dL 50(H) 55(H) 60(H)  Creatinine 0.61 - 1.24 mg/dL 1.72(H) 1.66(H) 1.97(H)  Sodium 135 - 145 mmol/L 152(H) 156(H) 153(H)  Potassium 3.5 - 5.1 mmol/L 3.4(L) 3.5 3.5  Chloride 98 - 111 mmol/L 124(H) 125(H) 126(H)  CO2 22 - 32 mmol/L 19(L) 20(L) 20(L)  Calcium 8.9 - 10.3 mg/dL 9.3 9.4 9.3    BP 117/79    Pulse (!) 54    Temp 99.9 F (37.7 C) (Oral)    Resp 20    Ht _0  (1.778 m)    Wt (!) 159.2 kg    SpO2 94%    BMI 50.36 kg/m    I/O last 3 completed shifts: In: 10012.6 [I.V.:3794.9; NG/GT:6217.8] Out: 5300 [Urine:4675; Stool:625] No intake/output data recorded.  SpO2: 94 % FiO2 (%): 35 %  Estimated body mass index is 50.36 kg/m as calculated from the  following:   Height as of this encounter: $RemoveBeforeD'5\' 10"'wjItplDrkkeyoY$  (1.778 m).   Weight as of this encounter: 159.2 kg.  SIGNIFICANT EVENTS   REVIEW OF SYSTEMS  PATIENT IS UNABLE TO PROVIDE COMPLETE REVIEW OF SYSTEMS DUE TO SEVERE CRITICAL ILLNESS        PHYSICAL EXAMINATION:  GENERAL:critically ill appearing, +resp distress HEAD: Normocephalic, atraumatic.  EYES: Pupils equal, round, reactive to light.  No scleral icterus.  MOUTH: Moist mucosal membrane. NECK: Supple.  PULMONARY: +rhonchi,  +wheezing CARDIOVASCULAR: S1 and S2. Regular rate and rhythm. No murmurs, rubs, or gallops.  GASTROINTESTINAL: Soft, nontender, -distended.  Positive bowel sounds.   MUSCULOSKELETAL: No swelling, clubbing, or edema.  NEUROLOGIC: obtunded, GCS<8 SKIN:intact,warm,dry  MEDICATIONS: I have reviewed all medications and confirmed regimen as documented   CULTURE RESULTS   Recent Results (from the past 240 hour(s))  Culture, respiratory     Status: None   Collection Time: 06/22/20 11:24 AM   Specimen: Tracheal Aspirate; Respiratory  Result Value Ref Range Status   Specimen Description   Final    TRACHEAL ASPIRATE Performed at [facility], [address] [place], [place] [address]    Special Requests   Final    NONE Performed at [facility], [address] [place], [place] [address]    Gram Stain   Final    ABUNDANT WBC PRESENT, PREDOMINANTLY PMN NO ORGANISMS SEEN    Culture   Final    NO GROWTH 2 DAYS Performed at [facility], [place], [place], [place] [address]    Report Status 06/24/2020 FINAL  Final  Culture, respiratory     Status: None   Collection Time: 06/25/20  2:45 AM   Specimen: Tracheal Aspirate; Respiratory  Result Value Ref Range Status   Specimen Description   Final    TRACHEAL ASPIRATE Performed at [facility], [address], [place], [place] [address]    Special Requests   Final    NONE Performed at [facility], [address] [place],  [address]    Gram Stain   Final    RARE WBC PRESENT, PREDOMINANTLY PMN NO ORGANISMS SEEN    Culture   Final    RARE Normal respiratory flora-no Staph aureus or Pseudomonas seen Performed at [facility] [address], [place],  [address]    Report Status 06/27/2020 FINAL  Final          IMAGING    DG Abd 1 View  Result Date: 06/30/2020 CLINICAL DATA:  OG tube placement. EXAM: ABDOMEN - 1 VIEW COMPARISON:  06/25/2020. FINDINGS:  NG tube noted with tip over the distal stomach. No gastric distention. Exam appears stable from prior exam. IMPRESSION: NG tube noted with tip over the distal stomach. No gastric distention. Electronically Signed   By: Marcello Moores  Register   On: 06/30/2020 11:23   DG Chest Port 1 View  Result Date: 06/30/2020 CLINICAL DATA:  Post intubation. EXAM: PORTABLE CHEST 1 VIEW COMPARISON:  June 27, 2020. FINDINGS: Endotracheal tube tip at the level of clavicular heads. Gastric catheter courses below the diaphragm outside the field of view. Right IJ approach central venous catheter with the tip projecting at the right atrium. Slightly improved bilateral airspace opacities. Low lung volumes. No visible pleural effusions or pneumothorax. Similar cardiomediastinal silhouette. IMPRESSION: 1. Support devices as detailed above. 2. Slightly improved bilateral airspace opacities. Electronically Signed   By: Margaretha Sheffield MD   On: 06/30/2020 10:13  Nutrition Status: Nutrition Problem: Inadequate oral intake Etiology: inability to eat Signs/Symptoms: NPO status Interventions: Tube feeding, Prostat, MVI     Indwelling Urinary Catheter continued, requirement due to   Reason to continue Indwelling Urinary Catheter strict Intake/Output monitoring for hemodynamic instability   Central Line/ continued, requirement due to  Reason to continue [place] of central venous pressure or other hemodynamic parameters and poor IV access   Ventilator continued, requirement due to severe respiratory failure   Ventilator Sedation RASS 0 to -2      ASSESSMENT AND PLAN SYNOPSIS Acute Hypoxic Respiratory failure secondary to acute encephalopathy due to Alcohol withdrawalwithAspiration Pneumoniawith brain damage due to ETOH abuse and DT's   Severe ACUTE Hypoxic and Hypercapnic Respiratory Failure -continue Full MV support -continue Bronchodilator Therapy -Wean Fio2 and PEEP as tolerated -will  perform SAT/SBT when respiratory parameters are met -VAP/VENT bundle implementation  ACUTE DIASTOLIC CARDIAC FAILURE-  -oxygen as needed -Lasix as tolerated   Morbid obesity, possible OSA.   Will certainly impact respiratory mechanics, ventilator weaning Suspect will need to consider additional PEEP  ACUTE KIDNEY INJURY/Renal Failure -continue Foley Catheter-assess need -Avoid nephrotoxic agents -Follow urine output, BMP -Ensure adequate renal perfusion, optimize oxygenation -Renal dose medications     NEUROLOGY Acute toxic metabolic encephalopathy, need for sedation Goal RASS -2 to -3   CARDIAC ICU monitoring  GI GI PROPHYLAXIS as indicated   DIET-->TF's as tolerated Constipation protocol as indicated  ENDO - will use ICU hypoglycemic\Hyperglycemia protocol if indicated     ELECTROLYTES -follow labs as needed -replace as needed -pharmacy consultation and following   DVT/GI PRX ordered and assessed TRANSFUSIONS AS NEEDED MONITOR FSBS I Assessed the need for Labs I Assessed the need for Foley I Assessed the need for Central Venous Line Family Discussion when available I Assessed the need for Mobilization I made an Assessment of medications to be adjusted accordingly Safety Risk assessment completed   CASE DISCUSSED IN MULTIDISCIPLINARY ROUNDS WITH ICU TEAM  Critical Care Time devoted to patient care services described in this note is 45 minutes.   Overall, patient is critically ill, prognosis is guarded.  Patient with Multiorgan failure and at high risk for cardiac arrest and death.    Corrin Parker, M.D.  [facility]  Medical Director [place] Director [facility] Cardio-Pulmonary Department

## 2020-07-01 NOTE — Plan of Care (Signed)
  Problem: Nutrition: Goal: Adequate nutrition will be maintained Outcome: Progressing   Problem: Elimination: Goal: Will not experience complications related to bowel motility Outcome: Progressing Goal: Will not experience complications related to urinary retention Outcome: Progressing   

## 2020-07-01 NOTE — Progress Notes (Signed)
PHARMACY CONSULT NOTE  Pharmacy Consult for Electrolyte Monitoring and Replacement   Recent Labs: Potassium (mmol/L)  Date Value  07/01/2020 3.4 (L)  02/12/2013 3.9   Magnesium (mg/dL)  Date Value  70/17/7939 2.1   Calcium (mg/dL)  Date Value  03/00/9233 9.3   Calcium, Total (mg/dL)  Date Value  00/76/2263 8.7   Albumin (g/dL)  Date Value  33/54/5625 2.2 (L)  02/12/2013 3.6   Phosphorus (mg/dL)  Date Value  63/89/3734 5.4 (H)   Sodium (mmol/L)  Date Value  07/01/2020 152 (H)  02/12/2013 139   Assessment: 39 year old male here with encephalopathy s/t alcohol withdrawal. Patient intubated and sedated, requiring Dilaudid, propofol and Versed to maintain sedation. Patient on Librium and phenobarbital BID for alcohol withdrawal and to help facilitate weaning from vent. Pharmacy to manage electrolytes.  Goal of Therapy:  Electrolytes WNL  Plan:   KCl 40 mEq x 1 per Tube  F/u with AM labs 11/28   Lowella Bandy ,PharmD Clinical Pharmacist 07/01/2020 7:17 AM

## 2020-07-02 ENCOUNTER — Inpatient Hospital Stay: Payer: Medicaid Other

## 2020-07-02 DIAGNOSIS — F10231 Alcohol dependence with withdrawal delirium: Secondary | ICD-10-CM | POA: Diagnosis not present

## 2020-07-02 DIAGNOSIS — J9601 Acute respiratory failure with hypoxia: Secondary | ICD-10-CM | POA: Diagnosis not present

## 2020-07-02 DIAGNOSIS — G9341 Metabolic encephalopathy: Secondary | ICD-10-CM | POA: Diagnosis not present

## 2020-07-02 LAB — CBC WITH DIFFERENTIAL/PLATELET
Abs Immature Granulocytes: 0.16 10*3/uL — ABNORMAL HIGH (ref 0.00–0.07)
Basophils Absolute: 0 10*3/uL (ref 0.0–0.1)
Basophils Relative: 0 %
Eosinophils Absolute: 0.5 10*3/uL (ref 0.0–0.5)
Eosinophils Relative: 4 %
HCT: 27.7 % — ABNORMAL LOW (ref 39.0–52.0)
Hemoglobin: 9.1 g/dL — ABNORMAL LOW (ref 13.0–17.0)
Immature Granulocytes: 1 %
Lymphocytes Relative: 17 %
Lymphs Abs: 2 10*3/uL (ref 0.7–4.0)
MCH: 36 pg — ABNORMAL HIGH (ref 26.0–34.0)
MCHC: 32.9 g/dL (ref 30.0–36.0)
MCV: 109.5 fL — ABNORMAL HIGH (ref 80.0–100.0)
Monocytes Absolute: 0.6 10*3/uL (ref 0.1–1.0)
Monocytes Relative: 5 %
Neutro Abs: 8.4 10*3/uL — ABNORMAL HIGH (ref 1.7–7.7)
Neutrophils Relative %: 73 %
Platelets: 313 10*3/uL (ref 150–400)
RBC: 2.53 MIL/uL — ABNORMAL LOW (ref 4.22–5.81)
RDW: 15.1 % (ref 11.5–15.5)
WBC: 11.6 10*3/uL — ABNORMAL HIGH (ref 4.0–10.5)
nRBC: 0 % (ref 0.0–0.2)

## 2020-07-02 LAB — RENAL FUNCTION PANEL
Albumin: 2.2 g/dL — ABNORMAL LOW (ref 3.5–5.0)
Anion gap: 5 (ref 5–15)
BUN: 49 mg/dL — ABNORMAL HIGH (ref 6–20)
CO2: 20 mmol/L — ABNORMAL LOW (ref 22–32)
Calcium: 9.1 mg/dL (ref 8.9–10.3)
Chloride: 119 mmol/L — ABNORMAL HIGH (ref 98–111)
Creatinine, Ser: 1.45 mg/dL — ABNORMAL HIGH (ref 0.61–1.24)
GFR, Estimated: >60 mL/min (ref >60)
Glucose, Bld: 129 mg/dL — ABNORMAL HIGH (ref 70–99)
Phosphorus: 4.8 mg/dL — ABNORMAL HIGH (ref 2.5–4.6)
Potassium: 3.7 mmol/L (ref 3.5–5.1)
Sodium: 144 mmol/L (ref 135–145)

## 2020-07-02 LAB — GLUCOSE, CAPILLARY
Glucose-Capillary: 104 mg/dL — ABNORMAL HIGH (ref 70–99)
Glucose-Capillary: 108 mg/dL — ABNORMAL HIGH (ref 70–99)
Glucose-Capillary: 116 mg/dL — ABNORMAL HIGH (ref 70–99)
Glucose-Capillary: 116 mg/dL — ABNORMAL HIGH (ref 70–99)
Glucose-Capillary: 122 mg/dL — ABNORMAL HIGH (ref 70–99)
Glucose-Capillary: 125 mg/dL — ABNORMAL HIGH (ref 70–99)

## 2020-07-02 LAB — MAGNESIUM: Magnesium: 2 mg/dL (ref 1.7–2.4)

## 2020-07-02 MED ORDER — FREE WATER
200.0000 mL | Status: DC
  Administered 2020-07-02 – 2020-07-06 (×16): 200 mL

## 2020-07-02 NOTE — Progress Notes (Signed)
No acute events overnight, decreased Versed drip to 10 at beginning of shift, sedation maintained well with the combination of versed, precedex, and dilaudid. He has had some episodes of opening his eyes and moving legs and arms, but he was not violent or dyssynchronous with vent.

## 2020-07-02 NOTE — Progress Notes (Signed)
PHARMACY CONSULT NOTE  Pharmacy Consult for Electrolyte Monitoring and Replacement   Recent Labs: Potassium (mmol/L)  Date Value  07/02/2020 3.7  02/12/2013 3.9   Magnesium (mg/dL)  Date Value  09/47/0962 2.0   Calcium (mg/dL)  Date Value  83/66/2947 9.1   Calcium, Total (mg/dL)  Date Value  65/46/5035 8.7   Albumin (g/dL)  Date Value  46/56/8127 2.2 (L)  02/12/2013 3.6   Phosphorus (mg/dL)  Date Value  51/70/0174 4.8 (H)   Sodium (mmol/L)  Date Value  07/02/2020 144  02/12/2013 139   Assessment: 39 year old male here with encephalopathy s/t alcohol withdrawal. Patient intubated and sedated, requiring Dilaudid, midazolam and Precedex to maintain sedation.  Pharmacy to manage electrolytes.  MIVF: 5% dextrose at 75 mL/hr  Free water: 200 mL q2h  Goal of Therapy:  Electrolytes WNL  Plan:   No electrolytes replacement warranted today  Continue free water flushes  F/u with AM labs 11/29   Lowella Bandy ,PharmD Clinical Pharmacist 07/02/2020 10:00 AM

## 2020-07-02 NOTE — Progress Notes (Signed)
CRITICAL CARE NOTE 39 yo male significant history of ETOH and polysubstance abuse presented to the ED with altered mental status admitted to the ICU requiring precedex drip and ultimately intubation & mechanical ventilation.  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 WBC doubled 11/17, concern for aspiration PNA, PCT pending, CXR inconclusive Now requiring vasopressors 06/22/20 Patient intubated and sedated, overnight RASS: -5 no acute events. Plan today to aggressively titrate down on versed drip, fentanyl drip discontinued 06/23/20-patient unable to have SBT today due to sedation. Despite decreasing versed to 7 and completely stopping Propofol patient is only partially arousable. Will continue to wean sedation to RASS-0 with plan for SBT. 06/24/20- patient is being weaned off sedation as possible. 06/25/20-issues with hypoxia and asynchrony, patient has been intermittently febrile 11/25severe hypoxia, resp failure, failure to wean from vent 11/26-severe brain damage from ETOH abuse, severe DT's 11/27 failure to wean from vent Consults:    Procedures:  06/20/20 ETT >> 06/20/20 CVC 3 L >>  Significant Diagnostic Tests:  06/19/20 CT head >> no acute intracranial abnormality 06/20/20 US abdomen>>mild hepatic steatosis 06/21/20 Echocardiogram>>LVEF 65-70%, no wall motion abnormalities, no other abnormalities noted Micro Data:  06/19/20 COVID-19 >> negative 06/19/20 Influenza PCR A/B >> negative 06/20/20 MRSA PCR >> negative 06/20/20 tracheal aspirate >>NG  Antimicrobials:  06/21/20 Unasyn >>11/25     CC  follow up respiratory failure  SUBJECTIVE Patient remains critically ill Prognosis  is guarded   Vent Mode: PCV FiO2 (%):  [30 %-35 %] 30 % Set Rate:  [20 bmp-30 bmp] 20 bmp Vt Set:  [550 mL] 550 mL PEEP:  [5 cmH20] 5 cmH20 Plateau Pressure:  [19 cmH20] 19 cmH20  CBC    Component Value Date/Time   WBC 11.6 (H) 07/02/2020 0445   RBC 2.53 (L) 07/02/2020 0445   HGB 9.1 (L) 07/02/2020 0445   HGB 16.1 02/12/2013 2028   HCT 27.7 (L) 07/02/2020 0445   HCT 47.2 02/12/2013 2028   PLT 313 07/02/2020 0445   PLT 278 02/12/2013 2028   MCV 109.5 (H) 07/02/2020 0445   MCV 95 02/12/2013 2028   MCH 36.0 (H) 07/02/2020 0445   MCHC 32.9 07/02/2020 0445   RDW 15.1 07/02/2020 0445   RDW 13.4 02/12/2013 2028   LYMPHSABS 2.0 07/02/2020 0445   MONOABS 0.6 07/02/2020 0445   EOSABS 0.5 07/02/2020 0445   BASOSABS 0.0 07/02/2020 0445    BMP Latest Ref Rng & Units 07/02/2020 07/01/2020 06/30/2020  Glucose 70 - 99 mg/dL 761(P) 509(T) 267(T)  BUN 6 - 20 mg/dL 24(P) 80(D) 98(P)  Creatinine 0.61 - 1.24 mg/dL 3.82(N) 0.53(Z) 7.67(H)  Sodium 135 - 145 mmol/L 144 152(H) 156(H)  Potassium 3.5 - 5.1 mmol/L 3.7 3.4(L) 3.5  Chloride 98 - 111 mmol/L 119(H) 124(H) 125(H)  CO2 22 - 32 mmol/L 20(L) 19(L) 20(L)  Calcium 8.9 - 10.3 mg/dL 9.1 9.3 9.4     BP 419/37   Pulse (!) 53   Temp 98.3 F (36.8 C) (Axillary)   Resp 20   Ht 5\' 10"  (1.778 m)   Wt (!) 159.2 kg   SpO2 92%   BMI 50.36 kg/m    I/O last 3 completed shifts: In: 5071 [I.V.:2930.4; NG/GT:2140.6] Out: 5650 [Urine:4850; Stool:800] No intake/output data recorded.  SpO2: 92 % FiO2 (%): 30 %  Estimated body mass index is 50.36 kg/m as calculated from the following:   Height as of this encounter: 5\' 10"  (1.778 m).   Weight as of this encounter: 159.2 kg.  SIGNIFICANT EVENTS   REVIEW OF SYSTEMS  PATIENT IS UNABLE TO PROVIDE COMPLETE REVIEW OF SYSTEMS DUE TO SEVERE CRITICAL ILLNESS      PHYSICAL EXAMINATION:  GENERAL:critically ill appearing, +resp distress HEAD: Normocephalic, atraumatic.  EYES: Pupils  equal, round, reactive to light.  No scleral icterus.  MOUTH: Moist mucosal membrane. NECK: Supple.  PULMONARY: +rhonchi, +wheezing CARDIOVASCULAR: S1 and S2. Regular rate and rhythm. No murmurs, rubs, or gallops.  GASTROINTESTINAL: Soft, nontender, -distended.  Positive bowel sounds.   MUSCULOSKELETAL: No swelling, clubbing, or edema.  NEUROLOGIC: obtunded, GCS<8 SKIN:intact,warm,dry  MEDICATIONS: I have reviewed all medications and confirmed regimen as documented   CULTURE RESULTS   Recent Results (from the past 240 hour(s))  Culture, respiratory     Status: None   Collection Time: 06/22/20 11:24 AM   Specimen: Tracheal Aspirate; Respiratory  Result Value Ref Range Status   Specimen Description   Final    TRACHEAL ASPIRATE Performed at [facility], [address] [place], [place]    Special Requests   Final    NONE Performed at [facility], [address] [place], [place]    Gram Stain   Final    ABUNDANT WBC PRESENT, PREDOMINANTLY PMN NO ORGANISMS SEEN    Culture   Final    NO GROWTH 2 DAYS Performed at [facility], [address], [place], [place]    Report Status 06/24/2020 FINAL  Final  Culture, respiratory     Status: None   Collection Time: 06/25/20  2:45 AM   Specimen: Tracheal Aspirate; Respiratory  Result Value Ref Range Status   Specimen Description   Final    TRACHEAL ASPIRATE Performed at [facility], [address] [place], [place]    Special Requests   Final    NONE Performed at [facility], [address] [place], [place]    Gram Stain   Final    RARE WBC PRESENT, PREDOMINANTLY PMN NO ORGANISMS SEEN    Culture   Final    RARE Normal respiratory flora-no Staph aureus or Pseudomonas seen Performed at [facility], [address], [place], [place]    Report Status 06/27/2020 FINAL  Final          IMAGING    DG Chest  Port 1 View  Result Date: 07/02/2020 CLINICAL DATA:  Intubated EXAM: PORTABLE CHEST 1 VIEW COMPARISON:  06/30/2020 FINDINGS: Appliances appear unchanged in position. Shallow inspiration. Mild cardiac enlargement. Patchy perihilar nodular infiltrates demonstrate mild improvement since previous study. No pleural effusions. No pneumothorax. IMPRESSION: Mild improvement of bilateral perihilar nodular infiltrates. Electronically Signed   By: 07/02/2020 M.D.   On: 07/02/2020 05:48     Nutrition Status: Nutrition Problem: Inadequate oral intake Etiology: inability to eat Signs/Symptoms: NPO status Interventions: Tube feeding, Prostat, MVI     Indwelling Urinary Catheter continued, requirement due to   Reason to continue Indwelling Urinary Catheter strict Intake/Output monitoring for hemodynamic instability   Central Line/ continued, requirement due to  Reason to continue 07/04/2020 Monitoring of central venous pressure or other hemodynamic parameters and poor IV access   Ventilator continued, requirement due to severe respiratory failure  Ventilator Sedation RASS 0 to -2      ASSESSMENT AND PLAN SYNOPSIS Acute Hypoxic Respiratory failure secondary to acute encephalopathy due to Alcohol withdrawalwithAspiration Pneumoniawith micro cellular brain damage due to ETOH abuse and DT's  Severe ACUTE Hypoxic and Hypercapnic Respiratory Failure -continue Full MV support -continue Bronchodilator Therapy -Wean Fio2 and PEEP as tolerated -VAP/VENT bundle implementation  ACUTE DIASTOLIC CARDIAC FAILURE-  -oxygen as needed -Lasix as tolerated   Morbid obesity, possible OSA.   Will certainly impact respiratory mechanics, ventilator weaning Suspect will need to consider additional PEEP   ACUTE KIDNEY INJURY/Renal Failure -continue Foley Catheter-assess need -Avoid nephrotoxic agents -Follow urine output, BMP -Ensure adequate renal perfusion, optimize oxygenation -Renal dose  medications     NEUROLOGY Acute toxic metabolic encephalopathy, need for sedation Goal RASS -2 to -3, severe micro cellular brain damage from ETOH abuse, severe DT's  CARDIAC ICU monitoring  ID -follow up cultures  GI GI PROPHYLAXIS as indicated   DIET-->TF's as tolerated Constipation protocol as indicated  ENDO - will use ICU hypoglycemic\Hyperglycemia protocol if indicated     ELECTROLYTES -follow labs as needed -replace as needed -pharmacy consultation and following   DVT/GI PRX ordered and assessed TRANSFUSIONS AS NEEDED MONITOR FSBS I Assessed the need for Labs I Assessed the need for Foley I Assessed the need for Central Venous Line Family Discussion when available I Assessed the need for Mobilization I made an Assessment of medications to be adjusted accordingly Safety Risk assessment completed   CASE DISCUSSED IN MULTIDISCIPLINARY ROUNDS WITH ICU TEAM  Critical Care Time devoted to patient care services described in this note is 45 minutes.   Overall, patient is critically ill, prognosis is guarded.  Patient with Multiorgan failure and at high risk for cardiac arrest and death.   Likely will need trach to survive  Lucie Leather, M.D.  [facility]  Medical Director [facility] Medical Director [facility] Cardio-Pulmonary Department

## 2020-07-03 ENCOUNTER — Inpatient Hospital Stay: Payer: Medicaid Other

## 2020-07-03 LAB — GLUCOSE, CAPILLARY
Glucose-Capillary: 99 mg/dL (ref 70–99)
Glucose-Capillary: 98 mg/dL (ref 70–99)
Glucose-Capillary: 80 mg/dL (ref 70–99)
Glucose-Capillary: 99 mg/dL (ref 70–99)
Glucose-Capillary: 106 mg/dL — ABNORMAL HIGH (ref 70–99)
Glucose-Capillary: 115 mg/dL — ABNORMAL HIGH (ref 70–99)

## 2020-07-03 LAB — BASIC METABOLIC PANEL
Anion gap: 6 (ref 5–15)
BUN: 47 mg/dL — ABNORMAL HIGH (ref 6–20)
CO2: 22 mmol/L (ref 22–32)
Calcium: 9.1 mg/dL (ref 8.9–10.3)
Chloride: 118 mmol/L — ABNORMAL HIGH (ref 98–111)
Creatinine, Ser: 1.33 mg/dL — ABNORMAL HIGH (ref 0.61–1.24)
GFR, Estimated: >60 mL/min (ref >60)
Glucose, Bld: 122 mg/dL — ABNORMAL HIGH (ref 70–99)
Potassium: 3.9 mmol/L (ref 3.5–5.1)
Sodium: 146 mmol/L — ABNORMAL HIGH (ref 135–145)

## 2020-07-03 LAB — PROCALCITONIN: Procalcitonin: <0.1 ng/mL

## 2020-07-03 LAB — CBC WITH DIFFERENTIAL/PLATELET
Abs Immature Granulocytes: 0.14 10*3/uL — ABNORMAL HIGH (ref 0.00–0.07)
Basophils Absolute: 0 10*3/uL (ref 0.0–0.1)
Basophils Relative: 0 %
Eosinophils Absolute: 0.5 10*3/uL (ref 0.0–0.5)
Eosinophils Relative: 4 %
HCT: 27.8 % — ABNORMAL LOW (ref 39.0–52.0)
Hemoglobin: 9.3 g/dL — ABNORMAL LOW (ref 13.0–17.0)
Immature Granulocytes: 1 %
Lymphocytes Relative: 16 %
Lymphs Abs: 1.7 10*3/uL (ref 0.7–4.0)
MCH: 35.6 pg — ABNORMAL HIGH (ref 26.0–34.0)
MCHC: 33.5 g/dL (ref 30.0–36.0)
MCV: 106.5 fL — ABNORMAL HIGH (ref 80.0–100.0)
Monocytes Absolute: 0.7 10*3/uL (ref 0.1–1.0)
Monocytes Relative: 7 %
Neutro Abs: 7.6 10*3/uL (ref 1.7–7.7)
Neutrophils Relative %: 72 %
Platelets: 299 10*3/uL (ref 150–400)
RBC: 2.61 MIL/uL — ABNORMAL LOW (ref 4.22–5.81)
RDW: 14.7 % (ref 11.5–15.5)
WBC: 10.6 10*3/uL — ABNORMAL HIGH (ref 4.0–10.5)
nRBC: 0 % (ref 0.0–0.2)

## 2020-07-03 LAB — PHOSPHORUS: Phosphorus: 4.4 mg/dL (ref 2.5–4.6)

## 2020-07-03 LAB — MAGNESIUM: Magnesium: 1.9 mg/dL (ref 1.7–2.4)

## 2020-07-03 MED ORDER — SODIUM CHLORIDE 0.9 % IV SOLN
3.0000 g | Freq: Four times a day (QID) | INTRAVENOUS | Status: DC
  Administered 2020-07-03 – 2020-07-07 (×18): 3 g via INTRAVENOUS
  Filled 2020-07-03 (×2): qty 8
  Filled 2020-07-03: qty 0.86
  Filled 2020-07-03: qty 3
  Filled 2020-07-03: qty 8
  Filled 2020-07-03: qty 0.18
  Filled 2020-07-03 (×4): qty 8
  Filled 2020-07-03: qty 3
  Filled 2020-07-03: qty 0.18
  Filled 2020-07-03 (×2): qty 8
  Filled 2020-07-03: qty 3
  Filled 2020-07-03: qty 8
  Filled 2020-07-03 (×2): qty 3
  Filled 2020-07-03: qty 0.86
  Filled 2020-07-03: qty 3
  Filled 2020-07-03: qty 8

## 2020-07-03 NOTE — Progress Notes (Signed)
PHARMACY CONSULT NOTE  Pharmacy Consult for Electrolyte Monitoring and Replacement   Recent Labs: Potassium (mmol/L)  Date Value  07/03/2020 3.9  02/12/2013 3.9   Magnesium (mg/dL)  Date Value  88/50/2774 1.9   Calcium (mg/dL)  Date Value  12/87/8676 9.1   Calcium, Total (mg/dL)  Date Value  72/04/4708 8.7   Albumin (g/dL)  Date Value  62/83/6629 2.2 (L)  02/12/2013 3.6   Phosphorus (mg/dL)  Date Value  47/65/4650 4.4   Sodium (mmol/L)  Date Value  07/03/2020 146 (H)  02/12/2013 139   Assessment: 39 year old male here with encephalopathy s/t alcohol withdrawal. Patient intubated and sedated, requiring Dilaudid, midazolam and Precedex to maintain sedation.  Pharmacy to manage electrolytes.  Tube feeds: Vital AF 1.2 Cal at 45 mL/hr  + PROSource TF 90 mL 5 times daily per tube  Free water: 200 mL every 4 hours  Goal of Therapy:  Electrolytes WNL  Plan:   No electrolytes replacement warranted today  Hypernatremia: borderline high  continue free water flushes at 200 mL per tube every 4 hours and re-check in am  F/u with AM labs 11/30  Lowella Bandy ,PharmD Clinical Pharmacist 07/03/2020 9:03 AM

## 2020-07-03 NOTE — Progress Notes (Signed)
NAME:  Nicholas Valdez, MRN:  [id number], DOB:  23-Dec-1980, LOS: 14 ADMISSION DATE:  06/19/2020, CONSULTATION DATE:  06/20/2020 REFERRING MD:  Dr. Alanda Slim, CHIEF COMPLAINT:  Altered Mental Status   Brief History   39 yo male significant history of ETOH and polysubstance abuse presented to the ED with altered mental status admitted to the ICU requiring precedex drip and ultimately intubation & mechanical ventilation.  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 Despite decreasing versed to 7 and completely stopping Propofol patient is only partially arousable. Will continue to wean sedation to RASS-0 with plan for SBT. 06/24/20- patient is being weaned off sedation as possible.  06/25/20-issues with hypoxia and asynchrony, patient has been intermittently febrile 11/25severe hypoxia, resp failure, failure to wean from vent 11/26-severe brain damage from ETOH abuse, severe DT's 11/27 failure to wean from vent 11/28- patient is difficult to wean with sedation, slight decrease in dilaudid patient tried to leave while intubated          Procedures:  06/20/20 ETT >> 06/20/20 CVC 3 L >>  Significant Diagnostic Tests:  06/19/20 CT head >> no acute intracranial abnormality 06/20/20 US abdomen >> mild hepatic steatosis 06/21/20 Echocardiogram >> LVEF 65-70%, no wall motion abnormalities, no other abnormalities noted Micro Data:  06/19/20 COVID-19 >> negative 06/19/20 Influenza PCR A/B >> negative 06/20/20 MRSA PCR >> negative 06/20/20 tracheal aspirate >>  Antimicrobials:  06/21/20 Unasyn >>  Interim history/subjective:  Patient intubated and sedated, overnight RASS: -5 no acute events. Plan today to aggressively titrate down on versed drip, fentanyl drip discontinued.  Labs/ Imaging personally  reviewed Net: +800 mL (+3.6L since admit) Na+/ K+: 140/ 3.3-replaced overnight BUN/Cr.: 16/ 2.46- increased Hgb: 10.7 CK: 722 - increased  WBC/ TMAX: 11.8/ 37.9 Lactic/ PCT: 1.1/ 0.35  ABG: 7.32/ 30/ 125/ 15.5 CXR 06/21/20: Increased bibasilar infiltrates - 18:00  Objective   Blood pressure 122/78, pulse (!) 59, temperature (!) 96.3 F (35.7 C), temperature source Axillary, resp. rate 17, height 5\' 10"  (1.778 m), weight (!) 159.2 kg, SpO2 96 %.    Vent Mode: PCV FiO2 (%):  [30 %-35 %] 30 % Set Rate:  [20 bmp] 20 bmp PEEP:  [5 cmH20] 5 cmH20   Intake/Output Summary (Last 24 hours) at 07/03/2020 1507 Last data filed at 07/03/2020 0800 Gross per 24 hour  Intake 1491.04 ml  Output 2900 ml  Net -1408.96 ml   Filed Weights   06/28/20 0449 06/29/20 0406 06/30/20 0500  Weight: (!) 158 kg (!) 159 kg (!) 159.2 kg    Examination: General: Adult male, critically ill, lying in bed intubated & sedated requiring mechanical ventilation  HEENT: MM pink/moist, anicteric, atraumatic, neck supple Neuro: sedated, RASS -5, unable to follow commands, PERRL +3, MAE CV: s1s2 RRR, NSR on monitor, no r/m/g Pulm: Regular, non labored on MV GI: soft, rounded, bs x 4 GU: foley in place with yellow urine sediment Skin: scattered ecchymosis no rashes/lesions noted Extremities: warm/dry, pulses + 2 R/P, no edema noted    Assessment & Plan:  Acute Hypoxic Respiratory failure secondary to acute encephalopathy due to Alcohol withdrawal Suspected Aspiration Pneumonia Patient agitated due to ETOH withdrawal, obtunded with medication administration- unable to protect airway and intubated on 11/16. Concerns for aspiration PNA overnight due to copious secretions and increased FiO2  requirements, febrile 06/21/20 - Ventilator settings: PCV 8 mL/kg, 30% FiO2, 5 PEEP, continue ventilator support & lung protective strategies - Wean PEEP & FiO2 as tolerated, maintain SpO2 > 90% - Head of bed elevated 30  degrees, VAP protocol in place - Plateau pressures less than 30 cm H20  - Intermittent chest x-ray & ABG PRN - Daily WUA with SBT as tolerated  - Ensure adequate pulmonary hygiene  - F/u cultures, trend PCT - PAD protocol in place: continue Dilaudid drip & Propofol & Versed drip - plan to titrate down on propofol drip as tolerated, leaving the patient on dilaudid & versed - continue Unasyn +vanc for aspiration pneumonia coverage  Acute Encephalopathy due to ETOH withdrawal Hepatic Steatosis Polysubstance abuse Ammonia- trending down: 76 ~ 47 ~ 38 - continue lactulose - CIWA protocol - continue thiamine & folic acid - continue librium QID - trend ammonia - daily CMP - continue IVF resuscitation  Acute Kidney Injury  In the setting of sepsis. Baseline creatinine 0.88, creatinine on admission 1.54 this AM (11/17). Cr. Trending up 2.46/ serum CO2 16/ CK 722 - ordered sodium bicarb infusion for 12 h 150 meq at 75 mL/h - continue IVF hydration @ 50 mL/h - Strict I/O's: alert provider if UOP < 0.5 mL/kg/hr - Daily CMP, replace electrolytes PRN - Avoid nephrotoxic agents as able, ensure adequate renal perfusion  Sepsis with suspected Septic Shock due to suspected Aspiration Pneumonia Lactic: 3.3 ~ 2.5 ~ 2.1~1.1 PCT: 0.12~0.35 Increased secretions, FiO2 and pressor requirements overnight- 06/20/20 - Unasyn ordered per pharmacy for aspiration coverage - continue levophed drip, maintain MAP > 65 - continuous cardiac monitoring  Hypoalbuminia in the setting of ETOH abuse - continue trickle TF - continue reglan BID for suspected gastritis - Q 4 CBG  Best practice:  Diet: NPO Pain/Anxiety/Delirium protocol (if indicated): fentanyl & propofol & versed VAP protocol (if indicated): initiated DVT prophylaxis: enoxaparin SQ GI prophylaxis: protonix Glucose control: monitor Q 4 Mobility: bedrest, mobilize as tolerated Code Status: FULL Family Communication:left VM 11/17, will update  11/18 PRN Disposition: ICU  Labs   CBC: Recent Labs  Lab 06/29/20 0100 06/30/20 0424 07/01/20 0524 07/02/20 0445 07/03/20 0521  WBC 8.3 8.4 9.4 11.6* 10.6*  NEUTROABS 5.6 6.0 6.3 8.4* 7.6  HGB 8.8* 8.8* 8.6* 9.1* 9.3*  HCT 26.7* 26.6* 27.0* 27.7* 27.8*  MCV 109.0* 110.8* 111.6* 109.5* 106.5*  PLT 350 360 342 313 299    Basic Metabolic Panel: Recent Labs  Lab 06/29/20 0100 06/30/20 0424 07/01/20 0524 07/02/20 0445 07/03/20 0521  NA 153* 156* 152* 144 146*  K 3.5 3.5 3.4* 3.7 3.9  CL 126* 125* 124* 119* 118*  CO2 20* 20* 19* 20* 22  GLUCOSE 127* 121* 122* 129* 122*  BUN 60* 55* 50* 49* 47*  CREATININE 1.97* 1.66* 1.72* 1.45* 1.33*  CALCIUM 9.3 9.4 9.3 9.1 9.1  MG 2.2 2.2 2.1 2.0 1.9  PHOS 3.3 4.2 5.4* 4.8* 4.4   GFR: Estimated Creatinine Clearance: 116.8 mL/min (A) (by C-G formula based on SCr of 1.33 mg/dL (H)). Recent Labs  Lab 06/30/20 0424 07/01/20 0524 07/02/20 0445 07/03/20 0521  PROCALCITON  --   --   --  <0.10  WBC 8.4 9.4 11.6* 10.6*    Liver Function Tests: Recent Labs  Lab 06/27/20 0442 06/27/20 0442 06/28/20 0355 06/29/20 0100 06/30/20 0424 07/01/20 0524 07/02/20 0445  AST 34  --  35 88* 66* 53*  --   ALT 22  --  24 54* 56* 54*  --   ALKPHOS 46  --  52 49 45 45  --   BILITOT 0.9  --  0.6 0.4 0.3 0.4  --   PROT 6.9  --  6.7 6.5 6.6 6.5  --   ALBUMIN 2.5*   < > 2.2* 2.3* 2.2* 2.2* 2.2*   < > = values in this interval not displayed.   No results for input(s): LIPASE, AMYLASE in the last 168 hours. No results for input(s): AMMONIA in the last 168 hours.  ABG    Component Value Date/Time   PHART 7.26 (L) 06/27/2020 0340   PCO2ART 41 06/27/2020 0340   PO2ART 78 (L) 06/27/2020 0340   HCO3 18.4 (L) 06/27/2020 0340   ACIDBASEDEF 7.9 (H) 06/26/2020 1140   O2SAT 93.3 06/27/2020 0340     Coagulation Profile: No results for input(s): INR, PROTIME in the last 168 hours.  Cardiac Enzymes: No results for input(s): CKTOTAL, CKMB,  CKMBINDEX, TROPONINI in the last 168 hours.  HbA1C: Hgb A1c MFr Bld  Date/Time Value Ref Range Status  06/26/2020 06:21 PM 5.3 4.8 - 5.6 % Final    Comment:    (NOTE) Pre diabetes:          5.7%-6.4%  Diabetes:              >6.4%  Glycemic control for   <7.0% adults with diabetes     CBG: Recent Labs  Lab 07/02/20 1933 07/02/20 2357 07/03/20 0401 07/03/20 0747 07/03/20 1115  GLUCAP 116* 116* 99 98 80    Critical care time: 33 minutes      Critical care provider statement:    Critical care time (minutes):  33   Critical care time was exclusive of:  Separately billable procedures and  treating other patients   Critical care was necessary to treat or prevent imminent or  life-threatening deterioration of the following conditions:  Altered mental status with encephalopathy, possible sepsis, aspiration pneumonia, substance abuse , alcoholism   Critical care was time spent personally by me on the following  activities:  Development of treatment plan with patient or surrogate,  discussions with consultants, evaluation of patient's response to  treatment, examination of patient, obtaining history from patient or  surrogate, ordering and performing treatments and interventions, ordering  and review of laboratory studies and re-evaluation of patient's condition   I assumed direction of critical care for this patient from another  provider in my specialty: no       Vida Rigger, M.D.  Pulmonary & Critical Care Medicine  [facility]

## 2020-07-03 NOTE — Progress Notes (Signed)
At approximately 300, while in patient's room noted that OG tube had moved, stopped tubefeedings immediately and notified Britton-Lee NP of status. Antibiotics and xrays ordered. Will keep tube feeding off for now.

## 2020-07-03 NOTE — Progress Notes (Signed)
Pharmacy Antibiotic Note  Nicholas Valdez is a 39 y.o. male admitted on 06/19/2020. Patient with fever and increase in WBC. Trach aspirate collected. Pharmacy has been consulted for Unasyn dosing.  Plan: Unasyn 3 g IV q6h (this will be the second course of Unasyn this admission for aspiration)  Height: 5\' 10"  (177.8 cm) Weight: (!) 159.2 kg (350 lb 15.6 oz) IBW/kg (Calculated) : 73  Temp (24hrs), Avg:97.6 F (36.4 C), Min:96.3 F (35.7 C), Max:98.3 F (36.8 C)  Recent Labs  Lab 06/28/20 0355 06/29/20 0100 06/30/20 0424 07/01/20 0524 07/02/20 0445  WBC 9.3 8.3 8.4 9.4 11.6*  CREATININE 2.35* 1.97* 1.66* 1.72* 1.45*    Estimated Creatinine Clearance: 107.1 mL/min (A) (by C-G formula based on SCr of 1.45 mg/dL (H)).    Allergies  Allergen Reactions  . Ondansetron Hives  . Promethazine Hives  . Penicillins Hives    Did it involve swelling of the face/tongue/throat, SOB, or low BP? Yes Did it involve sudden or severe rash/hives, skin peeling, or any reaction on the inside of your mouth or nose? Yes Did you need to seek medical attention at a hospital or doctor's office? Yes When did it last happen?childhood If all above answers are "NO", may proceed with cephalosporin use.  07/04/20 Zofran [Ondansetron Hcl] Hives   Antimicrobials this admission: Unasyn 11/17 >>11/23, 11/29 >> Vancomycin 11/20 >>11/23   Microbiology results: 11/16 MRSA PCR: negative  Thank you for allowing pharmacy to be a part of this patient's care.  12/16, PharmD 07/03/2020 3:49 AM

## 2020-07-04 LAB — GLUCOSE, CAPILLARY
Glucose-Capillary: 102 mg/dL — ABNORMAL HIGH (ref 70–99)
Glucose-Capillary: 105 mg/dL — ABNORMAL HIGH (ref 70–99)
Glucose-Capillary: 109 mg/dL — ABNORMAL HIGH (ref 70–99)
Glucose-Capillary: 104 mg/dL — ABNORMAL HIGH (ref 70–99)
Glucose-Capillary: 106 mg/dL — ABNORMAL HIGH (ref 70–99)
Glucose-Capillary: 122 mg/dL — ABNORMAL HIGH (ref 70–99)

## 2020-07-04 LAB — RENAL FUNCTION PANEL
Albumin: 2.3 g/dL — ABNORMAL LOW (ref 3.5–5.0)
Anion gap: 10 (ref 5–15)
BUN: 43 mg/dL — ABNORMAL HIGH (ref 6–20)
CO2: 21 mmol/L — ABNORMAL LOW (ref 22–32)
Calcium: 9.2 mg/dL (ref 8.9–10.3)
Chloride: 112 mmol/L — ABNORMAL HIGH (ref 98–111)
Creatinine, Ser: 1.36 mg/dL — ABNORMAL HIGH (ref 0.61–1.24)
GFR, Estimated: >60 mL/min (ref >60)
Glucose, Bld: 115 mg/dL — ABNORMAL HIGH (ref 70–99)
Phosphorus: 5.1 mg/dL — ABNORMAL HIGH (ref 2.5–4.6)
Potassium: 3.7 mmol/L (ref 3.5–5.1)
Sodium: 143 mmol/L (ref 135–145)

## 2020-07-04 LAB — CBC WITH DIFFERENTIAL/PLATELET
Abs Immature Granulocytes: 0.1 10*3/uL — ABNORMAL HIGH (ref 0.00–0.07)
Basophils Absolute: 0.1 10*3/uL (ref 0.0–0.1)
Basophils Relative: 1 %
Eosinophils Absolute: 0.5 10*3/uL (ref 0.0–0.5)
Eosinophils Relative: 5 %
HCT: 26.1 % — ABNORMAL LOW (ref 39.0–52.0)
Hemoglobin: 8.9 g/dL — ABNORMAL LOW (ref 13.0–17.0)
Immature Granulocytes: 1 %
Lymphocytes Relative: 15 %
Lymphs Abs: 1.4 10*3/uL (ref 0.7–4.0)
MCH: 35.6 pg — ABNORMAL HIGH (ref 26.0–34.0)
MCHC: 34.1 g/dL (ref 30.0–36.0)
MCV: 104.4 fL — ABNORMAL HIGH (ref 80.0–100.0)
Monocytes Absolute: 0.7 10*3/uL (ref 0.1–1.0)
Monocytes Relative: 8 %
Neutro Abs: 6.1 10*3/uL (ref 1.7–7.7)
Neutrophils Relative %: 70 %
Platelets: 267 10*3/uL (ref 150–400)
RBC: 2.5 MIL/uL — ABNORMAL LOW (ref 4.22–5.81)
RDW: 14.8 % (ref 11.5–15.5)
WBC: 8.8 10*3/uL (ref 4.0–10.5)
nRBC: 0 % (ref 0.0–0.2)

## 2020-07-04 LAB — MAGNESIUM: Magnesium: 2 mg/dL (ref 1.7–2.4)

## 2020-07-04 LAB — PROCALCITONIN: Procalcitonin: <0.1 ng/mL

## 2020-07-04 NOTE — Progress Notes (Signed)
Nutrition Follow-up  DOCUMENTATION CODES:   Morbid obesity  INTERVENTION:  Continue Vital AF 1.2 Cal at 45 mL/hr (1080 mL goal daily volume) + PROSource TF 90 mL 5 times daily per tube. Provides 1696 kcal, 191 grams of protein, 875 mL H2O daily.  Continue MVI daily per tube.  NUTRITION DIAGNOSIS:   Inadequate oral intake related to inability to eat as evidenced by NPO status.  Ongoing.  GOAL:   Patient will meet greater than or equal to 90% of their needs  Met with TF regimen.  MONITOR:   Vent status, Labs, Weight trends, TF tolerance, I & O's  REASON FOR ASSESSMENT:   Ventilator, Consult Enteral/tube feeding initiation and management  ASSESSMENT:   39 year old male with PMHx of EtOH abuse, polysubstance abuse, hepatitis C admitted with AMS, acute encephalopathy, EtOH withdrawal, suspected aspiration PNA, AKI, sepsis.  11/16 intubated  Patient is currently intubated on ventilator support MV: 12.1 L/min Temp (24hrs), Avg:97.3 F (36.3 C), Min:97.3 F (36.3 C), Max:97.3 F (36.3 C)  Medications reviewed and include: folic acid 1 mg daily per tube, free water 200 mL Q4hrs, Novolog 0-15 units Q4hrs, lactulose 20 grams BID, MVI daily, Protonix, Unasyn, Precedex gtt, Dilaudid gtt, Versed gtt.  Labs reviewed: CBG 102-115, Chloride 112, CO2 21, BUN 43, Creatinine 1.36, Phosphorus 5.1.  I/O: 3450 mL UOP Yesterday (0.9 mL/kg/hr)  Weight trend: 159.2 kg on 11/26; +8.3 kg from 11/18  Enteral Access: 18 Fr. OGT placed 11/18; terminates in distal stomach in region of pylorus per abdominal x-ray 11/29  TF regimen: Vital AF 1.2 Cal at 45 mL/hr + PROSource TF 90 mL 5 times daily  Discussed with RN and on rounds. Patient tolerating TFs. Plan for SBT today.  Diet Order:   Diet Order            Diet NPO time specified  Diet effective now                EDUCATION NEEDS:   No education needs have been identified at this time  Skin:  Skin Assessment: Reviewed RN  Assessment  Last BM:  07/03/2020 - type 7 per flexiseal  Height:   Ht Readings from Last 1 Encounters:  06/19/20 5' 10" (1.778 m)   Weight:   Wt Readings from Last 1 Encounters:  06/30/20 (!) 159.2 kg   Ideal Body Weight:  75.5 kg  BMI:  Body mass index is 50.36 kg/m.  Estimated Nutritional Needs:   Kcal:  1597-2033 (11-14 kcal/kg)  Protein:  189 grams (2.5 grams/kg IBW)  Fluid:  >/= 2.2 L/day   King, MS, RD, LDN Pager number available on Amion 

## 2020-07-04 NOTE — Progress Notes (Signed)
NAME:  Nicholas Valdez, MRN:  [id number], DOB:  [birth year], LOS: 15 ADMISSION DATE:  06/19/2020, CONSULTATION DATE:  06/20/2020 REFERRING MD:  Dr. Alanda Slim, CHIEF COMPLAINT:  Altered Mental Status   Brief History   39 yo male significant history of ETOH and polysubstance abuse presented to the ED with altered mental status admitted to the ICU requiring precedex drip and ultimately intubation & mechanical ventilation.  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 Despite decreasing versed to 7 and completely stopping Propofol patient is only partially arousable. Will continue to wean sedation to RASS-0 with plan for SBT. 06/24/20- patient is being weaned off sedation as possible.  06/25/20-issues with hypoxia and asynchrony, patient has been intermittently febrile  11/25severe hypoxia, resp failure, failure to wean from ven  11/26-severe brain damage from ETOH abuse, severe DT'  11/27 failure to wean from ven  11/29- patient is difficult to wean with sedation, slight decrease in dilaudid patient tried to leave while intubated  11/30- plan to decrease sedation and repeat SBT with extubation if possible.     Procedures:  06/20/20 ETT >> 06/20/20 CVC 3 L >>  Significant Diagnostic Tests:  06/19/20 CT head >> no acute intracranial abnormality 06/20/20 US abdomen >> mild hepatic steatosis 06/21/20 Echocardiogram >> LVEF 65-70%, no wall motion abnormalities, no other abnormalities noted Micro Data:  06/19/20 COVID-19 >> negative 06/19/20 Influenza PCR A/B >> negative 06/20/20 MRSA PCR >> negative 06/20/20 tracheal aspirate >>  Antimicrobials:  06/21/20 Unasyn >>  Interim history/subjective:  Patient intubated and sedated, overnight RASS: -5 no acute events. Plan today to aggressively titrate down on  versed drip, fentanyl drip discontinued.  Labs/ Imaging personally reviewed Net: +800 mL (+3.6L since admit) Na+/ K+: 140/ 3.3-replaced overnight BUN/Cr.: 16/ 2.46- increased Hgb: 10.7 CK: 722 - increased  WBC/ TMAX: 11.8/ 37.9 Lactic/ PCT: 1.1/ 0.35  ABG: 7.32/ 30/ 125/ 15.5 CXR 06/21/20: Increased bibasilar infiltrates - 18:00  Objective   Blood pressure 110/71, pulse (!) 56, temperature (!) 97.3 F (36.3 C), temperature source Axillary, resp. rate 20, height 5\' 10"  (1.778 m), weight (!) 159.2 kg, SpO2 95 %.    Vent Mode: PCV FiO2 (%):  [10 %-30 %] 30 % Set Rate:  [20 bmp-98 bmp] 20 bmp PEEP:  [5 cmH20] 5 cmH20   Intake/Output Summary (Last 24 hours) at 07/04/2020 1038 Last data filed at 07/03/2020 1800 Gross per 24 hour  Intake 2663.53 ml  Output 3450 ml  Net -786.47 ml   Filed Weights   06/28/20 0449 06/29/20 0406 06/30/20 0500  Weight: (!) 158 kg (!) 159 kg (!) 159.2 kg    Examination: General: Adult male, critically ill, lying in bed intubated & sedated requiring mechanical ventilation  HEENT: MM pink/moist, anicteric, atraumatic, neck supple Neuro: sedated, RASS -5, unable to follow commands, PERRL +3, MAE CV: s1s2 RRR, NSR on monitor, no r/m/g Pulm: Regular, non labored on MV GI: soft, rounded, bs x 4 GU: foley in place with yellow urine sediment Skin: scattered ecchymosis no rashes/lesions noted Extremities: warm/dry, pulses + 2 R/P, no edema noted    Assessment & Plan:  Acute Hypoxic Respiratory failure secondary to acute encephalopathy due to Alcohol withdrawal Suspected Aspiration Pneumonia Patient agitated due to ETOH withdrawal, obtunded with medication administration- unable to protect airway and intubated on  11/16. Concerns for aspiration PNA overnight due to copious secretions and increased FiO2 requirements, febrile 06/21/20 - Ventilator settings: PCV 8 mL/kg, 30% FiO2, 5 PEEP, continue ventilator support & lung protective strategies - Wean PEEP  & FiO2 as tolerated, maintain SpO2 > 90% - Head of bed elevated 30 degrees, VAP protocol in place - Plateau pressures less than 30 cm H20  - Intermittent chest x-ray & ABG PRN - Daily WUA with SBT as tolerated  - Ensure adequate pulmonary hygiene  - F/u cultures, trend PCT - PAD protocol in place: continue Dilaudid drip & Propofol & Versed drip - plan to titrate down on propofol drip as tolerated, leaving the patient on dilaudid & versed - continue Unasyn +vanc for aspiration pneumonia coverage  Acute Encephalopathy due to ETOH withdrawal Hepatic Steatosis Polysubstance abuse Ammonia- trending down: 76 ~ 47 ~ 38 - continue lactulose - CIWA protocol - continue thiamine & folic acid - continue librium QID - trend ammonia - daily CMP - continue IVF resuscitation  Acute Kidney Injury  In the setting of sepsis. Baseline creatinine 0.88, creatinine on admission 1.54 this AM (11/17). Cr. Trending up 2.46/ serum CO2 16/ CK 722 - ordered sodium bicarb infusion for 12 h 150 meq at 75 mL/h - continue IVF hydration @ 50 mL/h - Strict I/O's: alert provider if UOP < 0.5 mL/kg/hr - Daily CMP, replace electrolytes PRN - Avoid nephrotoxic agents as able, ensure adequate renal perfusion  Sepsis with suspected Septic Shock due to suspected Aspiration Pneumonia Lactic: 3.3 ~ 2.5 ~ 2.1~1.1 PCT: 0.12~0.35 Increased secretions, FiO2 and pressor requirements overnight- 06/20/20 - Unasyn ordered per pharmacy for aspiration coverage - continue levophed drip, maintain MAP > 65 - continuous cardiac monitoring  Hypoalbuminia in the setting of ETOH abuse - continue trickle TF - continue reglan BID for suspected gastritis - Q 4 CBG  Best practice:  Diet: NPO Pain/Anxiety/Delirium protocol (if indicated): fentanyl & propofol & versed VAP protocol (if indicated): initiated DVT prophylaxis: enoxaparin SQ GI prophylaxis: protonix Glucose control: monitor Q 4 Mobility: bedrest, mobilize as  tolerated Code Status: FULL Family Communication:left VM 11/17, will update 11/18 PRN Disposition: ICU  Labs   CBC: Recent Labs  Lab 06/30/20 0424 07/01/20 0524 07/02/20 0445 07/03/20 0521 07/04/20 0530  WBC 8.4 9.4 11.6* 10.6* 8.8  NEUTROABS 6.0 6.3 8.4* 7.6 6.1  HGB 8.8* 8.6* 9.1* 9.3* 8.9*  HCT 26.6* 27.0* 27.7* 27.8* 26.1*  MCV 110.8* 111.6* 109.5* 106.5* 104.4*  PLT 360 342 313 299 267    Basic Metabolic Panel: Recent Labs  Lab 06/30/20 0424 07/01/20 0524 07/02/20 0445 07/03/20 0521 07/04/20 0530  NA 156* 152* 144 146* 143  K 3.5 3.4* 3.7 3.9 3.7  CL 125* 124* 119* 118* 112*  CO2 20* 19* 20* 22 21*  GLUCOSE 121* 122* 129* 122* 115*  BUN 55* 50* 49* 47* 43*  CREATININE 1.66* 1.72* 1.45* 1.33* 1.36*  CALCIUM 9.4 9.3 9.1 9.1 9.2  MG 2.2 2.1 2.0 1.9 2.0  PHOS 4.2 5.4* 4.8* 4.4 5.1*   GFR: Estimated Creatinine Clearance: 114.2 mL/min (A) (by C-G formula based on SCr of 1.36 mg/dL (H)). Recent Labs  Lab 07/01/20 0524 07/02/20 0445 07/03/20 0521 07/04/20 0530  PROCALCITON  --   --  <0.10 <0.10  WBC 9.4 11.6* 10.6* 8.8    Liver Function Tests: Recent Labs  Lab 06/28/20 0355 06/28/20 0355 06/29/20 0100 06/30/20 0424 07/01/20 0524 07/02/20 0445 07/04/20 0530  AST 35  --  88* 66*  53*  --   --   ALT 24  --  54* 56* 54*  --   --   ALKPHOS 52  --  49 45 45  --   --   BILITOT 0.6  --  0.4 0.3 0.4  --   --   PROT 6.7  --  6.5 6.6 6.5  --   --   ALBUMIN 2.2*   < > 2.3* 2.2* 2.2* 2.2* 2.3*   < > = values in this interval not displayed.   No results for input(s): LIPASE, AMYLASE in the last 168 hours. No results for input(s): AMMONIA in the last 168 hours.  ABG    Component Value Date/Time   PHART 7.26 (L) 06/27/2020 0340   PCO2ART 41 06/27/2020 0340   PO2ART 78 (L) 06/27/2020 0340   HCO3 18.4 (L) 06/27/2020 0340   ACIDBASEDEF 7.9 (H) 06/26/2020 1140   O2SAT 93.3 06/27/2020 0340     Coagulation Profile: No results for input(s): INR, PROTIME in  the last 168 hours.  Cardiac Enzymes: No results for input(s): CKTOTAL, CKMB, CKMBINDEX, TROPONINI in the last 168 hours.  HbA1C: Hgb A1c MFr Bld  Date/Time Value Ref Range Status  06/26/2020 06:21 PM 5.3 4.8 - 5.6 % Final    Comment:    (NOTE) Pre diabetes:          5.7%-6.4%  Diabetes:              >6.4%  Glycemic control for   <7.0% adults with diabetes     CBG: Recent Labs  Lab 07/03/20 1531 07/03/20 2026 07/03/20 2316 07/04/20 0403 07/04/20 0752  GLUCAP 99 106* 115* 102* 105*    Critical care time: 33 minutes      Critical care provider statement:    Critical care time (minutes):  33   Critical care time was exclusive of:  Separately billable procedures and  treating other patients   Critical care was necessary to treat or prevent imminent or  life-threatening deterioration of the following conditions:  Altered mental status with encephalopathy, possible sepsis, aspiration pneumonia, substance abuse , alcoholism   Critical care was time spent personally by me on the following  activities:  Development of treatment plan with patient or surrogate,  discussions with consultants, evaluation of patient's response to  treatment, examination of patient, obtaining history from patient or  surrogate, ordering and performing treatments and interventions, ordering  and review of laboratory studies and re-evaluation of patient's condition   I assumed direction of critical care for this patient from another  provider in my specialty: no       Vida Rigger, M.D.  Pulmonary & Critical Care Medicine  [facility]

## 2020-07-04 NOTE — Progress Notes (Signed)
PHARMACY CONSULT NOTE  Pharmacy Consult for Electrolyte Monitoring and Replacement   Recent Labs: Potassium (mmol/L)  Date Value  07/04/2020 3.7  02/12/2013 3.9   Magnesium (mg/dL)  Date Value  40/37/0964 2.0   Calcium (mg/dL)  Date Value  38/38/1840 9.2   Calcium, Total (mg/dL)  Date Value  37/54/3606 8.7   Albumin (g/dL)  Date Value  77/10/4033 2.3 (L)  02/12/2013 3.6   Phosphorus (mg/dL)  Date Value  24/81/8590 5.1 (H)   Sodium (mmol/L)  Date Value  07/04/2020 143  02/12/2013 139   Assessment: 39 year old male here with encephalopathy s/t alcohol withdrawal. Patient intubated and sedated, requiring Dilaudid, midazolam and Precedex to maintain sedation.  Pharmacy to manage electrolytes.  Tube feeds: Vital AF 1.2 Cal at 45 mL/hr  + PROSource TF 90 mL 5 times daily per tube  Free water: 200 mL every 4 hours  Goal of Therapy:  Electrolytes WNL  Plan:   No electrolytes replacement warranted today  Hypernatremia: borderline high  continue free water flushes at 200 mL per tube every 4 hours and re-check in am  F/u with AM labs 12/1  Tressie Ellis 07/04/2020 8:59 AM

## 2020-07-05 ENCOUNTER — Inpatient Hospital Stay: Payer: Medicaid Other

## 2020-07-05 LAB — GLUCOSE, CAPILLARY
Glucose-Capillary: 101 mg/dL — ABNORMAL HIGH (ref 70–99)
Glucose-Capillary: 106 mg/dL — ABNORMAL HIGH (ref 70–99)
Glucose-Capillary: 81 mg/dL (ref 70–99)
Glucose-Capillary: 104 mg/dL — ABNORMAL HIGH (ref 70–99)
Glucose-Capillary: 99 mg/dL (ref 70–99)
Glucose-Capillary: 94 mg/dL (ref 70–99)

## 2020-07-05 LAB — CBC WITH DIFFERENTIAL/PLATELET
Abs Immature Granulocytes: 0.11 10*3/uL — ABNORMAL HIGH (ref 0.00–0.07)
Basophils Absolute: 0 10*3/uL (ref 0.0–0.1)
Basophils Relative: 1 %
Eosinophils Absolute: 0.5 10*3/uL (ref 0.0–0.5)
Eosinophils Relative: 7 %
HCT: 26.8 % — ABNORMAL LOW (ref 39.0–52.0)
Hemoglobin: 8.9 g/dL — ABNORMAL LOW (ref 13.0–17.0)
Immature Granulocytes: 1 %
Lymphocytes Relative: 17 %
Lymphs Abs: 1.3 10*3/uL (ref 0.7–4.0)
MCH: 35.3 pg — ABNORMAL HIGH (ref 26.0–34.0)
MCHC: 33.2 g/dL (ref 30.0–36.0)
MCV: 106.3 fL — ABNORMAL HIGH (ref 80.0–100.0)
Monocytes Absolute: 0.7 10*3/uL (ref 0.1–1.0)
Monocytes Relative: 9 %
Neutro Abs: 5.2 10*3/uL (ref 1.7–7.7)
Neutrophils Relative %: 65 %
Platelets: 290 10*3/uL (ref 150–400)
RBC: 2.52 MIL/uL — ABNORMAL LOW (ref 4.22–5.81)
RDW: 14.5 % (ref 11.5–15.5)
WBC: 7.9 10*3/uL (ref 4.0–10.5)
nRBC: 0 % (ref 0.0–0.2)

## 2020-07-05 LAB — BASIC METABOLIC PANEL
Anion gap: 7 (ref 5–15)
BUN: 41 mg/dL — ABNORMAL HIGH (ref 6–20)
CO2: 23 mmol/L (ref 22–32)
Calcium: 8.9 mg/dL (ref 8.9–10.3)
Chloride: 113 mmol/L — ABNORMAL HIGH (ref 98–111)
Creatinine, Ser: 1.26 mg/dL — ABNORMAL HIGH (ref 0.61–1.24)
GFR, Estimated: >60 mL/min (ref >60)
Glucose, Bld: 120 mg/dL — ABNORMAL HIGH (ref 70–99)
Potassium: 3.7 mmol/L (ref 3.5–5.1)
Sodium: 143 mmol/L (ref 135–145)

## 2020-07-05 LAB — PROCALCITONIN: Procalcitonin: <0.1 ng/mL

## 2020-07-05 LAB — PHOSPHORUS: Phosphorus: 5.5 mg/dL — ABNORMAL HIGH (ref 2.5–4.6)

## 2020-07-05 LAB — AMMONIA: Ammonia: 27 umol/L (ref 9–35)

## 2020-07-05 LAB — MAGNESIUM: Magnesium: 2.1 mg/dL (ref 1.7–2.4)

## 2020-07-05 MED ORDER — FLUCONAZOLE 100 MG PO TABS
100.0000 mg | ORAL_TABLET | Freq: Every day | ORAL | Status: DC
  Administered 2020-07-06 – 2020-07-11 (×6): 100 mg
  Filled 2020-07-05 (×7): qty 1

## 2020-07-05 MED ORDER — METOCLOPRAMIDE HCL 5 MG/ML IJ SOLN
5.0000 mg | Freq: Four times a day (QID) | INTRAMUSCULAR | Status: DC
  Administered 2020-07-05 – 2020-07-07 (×8): 5 mg via INTRAVENOUS
  Filled 2020-07-05 (×8): qty 2

## 2020-07-05 MED ORDER — SODIUM CHLORIDE 0.9 % IV SOLN
100.0000 mg | Freq: Once | INTRAVENOUS | Status: AC
  Administered 2020-07-05: 100 mg via INTRAVENOUS
  Filled 2020-07-05: qty 100

## 2020-07-05 MED ORDER — DEXTROSE 50 % IV SOLN
1.0000 | Freq: Once | INTRAVENOUS | Status: AC
  Administered 2020-07-05: 50 mL via INTRAVENOUS
  Filled 2020-07-05: qty 50

## 2020-07-05 MED ORDER — FLUCONAZOLE 100 MG PO TABS
200.0000 mg | ORAL_TABLET | Freq: Once | ORAL | Status: AC
  Administered 2020-07-05: 200 mg
  Filled 2020-07-05: qty 2

## 2020-07-05 MED ORDER — SODIUM CHLORIDE 0.9 % IV SOLN
50.0000 mg | INTRAVENOUS | Status: DC
  Filled 2020-07-05: qty 50

## 2020-07-05 MED ORDER — FUROSEMIDE 10 MG/ML IJ SOLN
40.0000 mg | Freq: Every day | INTRAMUSCULAR | Status: DC
  Administered 2020-07-05 – 2020-07-12 (×8): 40 mg via INTRAVENOUS
  Filled 2020-07-05 (×8): qty 4

## 2020-07-05 NOTE — Plan of Care (Signed)
SAT this am, pt was able to follow commands and tolerated pressure support for a couple hours. However, pt extremely restless, attempting to get out of bed, poor safety awareness. Pt also has copious secretions and extensive peripheral edema. Sedation resumed at previous rates and in fact increased later for patient safety. Tube feeding/protein supplements held today after emesis last night. Large output from NG, to low intermittent suction when not clamped for med admin. Reglan resumed today to prepare for restarting of tube feeding tomorrow. Lasix given with excellent response. Mom updated over phone.    Problem: Education: Goal: Knowledge of General Education information will improve Description: Including pain rating scale, medication(s)/side effects and non-pharmacologic comfort measures Outcome: Not Progressing   Problem: Health Behavior/Discharge Planning: Goal: Ability to manage health-related needs will improve Outcome: Not Progressing   Problem: Clinical Measurements: Goal: Ability to maintain clinical measurements within normal limits will improve Outcome: Not Progressing Goal: Will remain free from infection Outcome: Not Progressing Goal: Diagnostic test results will improve Outcome: Not Progressing Goal: Respiratory complications will improve Outcome: Not Progressing   Problem: Activity: Goal: Risk for activity intolerance will decrease Outcome: Not Progressing   Problem: Nutrition: Goal: Adequate nutrition will be maintained Outcome: Not Progressing   Problem: Coping: Goal: Level of anxiety will decrease Outcome: Not Progressing   Problem: Pain Managment: Goal: General experience of comfort will improve Outcome: Not Progressing   Problem: Clinical Measurements: Goal: Cardiovascular complication will be avoided Outcome: Progressing   Problem: Elimination: Goal: Will not experience complications related to bowel motility Outcome: Progressing Goal: Will not  experience complications related to urinary retention Outcome: Progressing   Problem: Safety: Goal: Ability to remain free from injury will improve Outcome: Progressing   Problem: Skin Integrity: Goal: Risk for impaired skin integrity will decrease Outcome: Progressing

## 2020-07-05 NOTE — Progress Notes (Signed)
NAME:  Nicholas Valdez, MRN:  [id number], DOB:  Aug 23, 1980, LOS: 16 ADMISSION DATE:  06/19/2020, CONSULTATION DATE:  06/20/2020 REFERRING MD:  Dr. Alanda Slim, CHIEF COMPLAINT:  Altered Mental Status   Brief History   39 yo male significant history of ETOH and polysubstance abuse presented to the ED with altered mental status admitted to the ICU requiring precedex drip and ultimately intubation & mechanical ventilation.  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 Despite decreasing versed to 7 and completely stopping Propofol patient is only partially arousable. Will continue to wean sedation to RASS-0 with plan for SBT. 06/24/20- patient is being weaned off sedation as possible.  06/25/20-issues with hypoxia and asynchrony, patient has been intermittently febrile  11/25severe hypoxia, resp failure, failure to wean from ven  11/26-severe brain damage from ETOH abuse, severe DT'  11/27 failure to wean from ven  11/29- patient is difficult to wean with sedation, slight decrease in dilaudid patient tried to leave while intubated  11/30- plan to decrease sedation and repeat SBT with extubation if possible.   12/1- patient became severely aggitated, tachypneic, tachycardic and with heavy endotracheal secretions. He is febrile    Procedures:  06/20/20 ETT >> 06/20/20 CVC 3 L >>  Significant Diagnostic Tests:  06/19/20 CT head >> no acute intracranial abnormality 06/20/20 US abdomen >> mild hepatic steatosis 06/21/20 Echocardiogram >> LVEF 65-70%, no wall motion abnormalities, no other abnormalities noted Micro Data:  06/19/20 COVID-19 >> negative 06/19/20 Influenza PCR A/B >> negative 06/20/20 MRSA PCR >> negative 06/20/20 tracheal aspirate >>  Antimicrobials:  06/21/20 Unasyn >>    Objective   Blood  pressure (!) 108/56, pulse 83, temperature (!) 101.2 F (38.4 C), temperature source Axillary, resp. rate (!) 21, height 5\' 10"  (1.778 m), weight (!) 159 kg, SpO2 91 %.    Vent Mode: PCV FiO2 (%):  [30 %] 30 % Set Rate:  [20 bmp] 20 bmp PEEP:  [5 cmH20] 5 cmH20 Pressure Support:  [5 cmH20] 5 cmH20 Plateau Pressure:  [18 cmH20] 18 cmH20   Intake/Output Summary (Last 24 hours) at 07/05/2020 1255 Last data filed at 07/05/2020 1208 Gross per 24 hour  Intake 5119.07 ml  Output 5900 ml  Net -780.93 ml   Filed Weights   06/29/20 0406 06/30/20 0500 07/05/20 0444  Weight: (!) 159 kg (!) 159.2 kg (!) 159 kg    Examination: General: Adult male, critically ill, lying in bed intubated & sedated requiring mechanical ventilation  HEENT: MM pink/moist, anicteric, atraumatic, neck supple Neuro: sedated, RASS -5, unable to follow commands, PERRL +3, MAE CV: s1s2 RRR, NSR on monitor, no r/m/g Pulm: Regular, non labored on MV GI: soft, rounded, bs x 4 GU: foley in place with yellow urine sediment Skin: scattered ecchymosis no rashes/lesions noted Extremities: warm/dry, pulses + 2 R/P, no edema noted    Assessment & Plan:  Acute Hypoxic Respiratory failure secondary to acute encephalopathy due to Alcohol withdrawal Suspected Aspiration Pneumonia Patient agitated due to ETOH withdrawal, obtunded with medication administration- unable to protect airway and intubated on 11/16. Concerns for aspiration PNA overnight due to copious secretions and increased FiO2 requirements, febrile 06/21/20 - Ventilator settings: PCV 8 mL/kg, 30% FiO2, 5 PEEP, continue ventilator support & lung protective strategies - Wean PEEP & FiO2 as tolerated, maintain SpO2 >  90% - Head of bed elevated 30 degrees, VAP protocol in place - Plateau pressures less than 30 cm H20  - Intermittent chest x-ray & ABG PRN - Daily WUA with SBT as tolerated  - Ensure adequate pulmonary hygiene  - F/u cultures, trend PCT - PAD protocol in  place: continue Dilaudid drip & Propofol & Versed drip - plan to titrate down on propofol drip as tolerated, leaving the patient on dilaudid & versed - continue Unasyn +vanc for aspiration pneumonia coverage  Acute Encephalopathy due to ETOH withdrawal Hepatic Steatosis Polysubstance abuse Ammonia- trending down: 76 ~ 47 ~ 38 - continue lactulose - CIWA protocol - continue thiamine & folic acid - continue librium QID - trend ammonia - daily CMP - continue IVF resuscitation  Acute Kidney Injury  In the setting of sepsis. Baseline creatinine 0.88, creatinine on admission 1.54 this AM (11/17). Cr. Trending up 2.46/ serum CO2 16/ CK 722 - ordered sodium bicarb infusion for 12 h 150 meq at 75 mL/h - continue IVF hydration @ 50 mL/h - Strict I/O's: alert provider if UOP < 0.5 mL/kg/hr - Daily CMP, replace electrolytes PRN - Avoid nephrotoxic agents as able, ensure adequate renal perfusion  Sepsis with suspected Septic Shock due to suspected Aspiration Pneumonia Lactic: 3.3 ~ 2.5 ~ 2.1~1.1 PCT: 0.12~0.35 Increased secretions, FiO2 and pressor requirements overnight- 06/20/20 - Unasyn ordered per pharmacy for aspiration coverage - continue levophed drip, maintain MAP > 65 - continuous cardiac monitoring  Hypoalbuminia in the setting of ETOH abuse - continue trickle TF - continue reglan BID for suspected gastritis - Q 4 CBG  Best practice:  Diet: NPO Pain/Anxiety/Delirium protocol (if indicated): fentanyl & propofol & versed VAP protocol (if indicated): initiated DVT prophylaxis: enoxaparin SQ GI prophylaxis: protonix Glucose control: monitor Q 4 Mobility: bedrest, mobilize as tolerated Code Status: FULL Family Communication:left VM 11/17, will update 11/18 PRN Disposition: ICU  Labs   CBC: Recent Labs  Lab 07/01/20 0524 07/02/20 0445 07/03/20 0521 07/04/20 0530 07/05/20 0332  WBC 9.4 11.6* 10.6* 8.8 7.9  NEUTROABS 6.3 8.4* 7.6 6.1 5.2  HGB 8.6* 9.1* 9.3* 8.9* 8.9*   HCT 27.0* 27.7* 27.8* 26.1* 26.8*  MCV 111.6* 109.5* 106.5* 104.4* 106.3*  PLT 342 313 299 267 290    Basic Metabolic Panel: Recent Labs  Lab 07/01/20 0524 07/02/20 0445 07/03/20 0521 07/04/20 0530 07/05/20 0332  NA 152* 144 146* 143 143  K 3.4* 3.7 3.9 3.7 3.7  CL 124* 119* 118* 112* 113*  CO2 19* 20* 22 21* 23  GLUCOSE 122* 129* 122* 115* 120*  BUN 50* 49* 47* 43* 41*  CREATININE 1.72* 1.45* 1.33* 1.36* 1.26*  CALCIUM 9.3 9.1 9.1 9.2 8.9  MG 2.1 2.0 1.9 2.0 2.1  PHOS 5.4* 4.8* 4.4 5.1* 5.5*   GFR: Estimated Creatinine Clearance: 123.1 mL/min (A) (by C-G formula based on SCr of 1.26 mg/dL (H)). Recent Labs  Lab 07/02/20 0445 07/03/20 0521 07/04/20 0530 07/05/20 0332  PROCALCITON  --  <0.10 <0.10 <0.10  WBC 11.6* 10.6* 8.8 7.9    Liver Function Tests: Recent Labs  Lab 06/29/20 0100 06/30/20 0424 07/01/20 0524 07/02/20 0445 07/04/20 0530  AST 88* 66* 53*  --   --   ALT 54* 56* 54*  --   --   ALKPHOS 49 45 45  --   --   BILITOT 0.4 0.3 0.4  --   --   PROT 6.5 6.6 6.5  --   --   ALBUMIN  2.3* 2.2* 2.2* 2.2* 2.3*   No results for input(s): LIPASE, AMYLASE in the last 168 hours. Recent Labs  Lab 07/05/20 0332  AMMONIA 27    ABG    Component Value Date/Time   PHART 7.26 (L) 06/27/2020 0340   PCO2ART 41 06/27/2020 0340   PO2ART 78 (L) 06/27/2020 0340   HCO3 18.4 (L) 06/27/2020 0340   ACIDBASEDEF 7.9 (H) 06/26/2020 1140   O2SAT 93.3 06/27/2020 0340     Coagulation Profile: No results for input(s): INR, PROTIME in the last 168 hours.  Cardiac Enzymes: No results for input(s): CKTOTAL, CKMB, CKMBINDEX, TROPONINI in the last 168 hours.  HbA1C: Hgb A1c MFr Bld  Date/Time Value Ref Range Status  06/26/2020 06:21 PM 5.3 4.8 - 5.6 % Final    Comment:    (NOTE) Pre diabetes:          5.7%-6.4%  Diabetes:              >6.4%  Glycemic control for   <7.0% adults with diabetes     CBG: Recent Labs  Lab 07/04/20 1947 07/04/20 2317  07/05/20 0319 07/05/20 0722 07/05/20 1121  GLUCAP 106* 122* 101* 106* 81    Critical care time: 33 minutes      Critical care provider statement:    Critical care time (minutes):  33   Critical care time was exclusive of:  Separately billable procedures and  treating other patients   Critical care was necessary to treat or prevent imminent or  life-threatening deterioration of the following conditions:  Altered mental status with encephalopathy, possible sepsis, aspiration pneumonia, substance abuse , alcoholism   Critical care was time spent personally by me on the following  activities:  Development of treatment plan with patient or surrogate,  discussions with consultants, evaluation of patient's response to  treatment, examination of patient, obtaining history from patient or  surrogate, ordering and performing treatments and interventions, ordering  and review of laboratory studies and re-evaluation of patient's condition   I assumed direction of critical care for this patient from another  provider in my specialty: no       Vida Rigger, M.D.  Pulmonary & Critical Care Medicine  [facility]

## 2020-07-05 NOTE — Progress Notes (Signed)
Patient regurgitating tube feeds up OG tube. Large volume found in bed. Approx residual measured. Tube feeds held. Sonda Rumble, NP made aware.

## 2020-07-05 NOTE — Progress Notes (Signed)
PHARMACY CONSULT NOTE  Pharmacy Consult for Electrolyte Monitoring and Replacement   Recent Labs: Potassium (mmol/L)  Date Value  07/05/2020 3.7  02/12/2013 3.9   Magnesium (mg/dL)  Date Value  62/95/2841 2.1   Calcium (mg/dL)  Date Value  32/44/0102 8.9   Calcium, Total (mg/dL)  Date Value  72/53/6644 8.7   Albumin (g/dL)  Date Value  03/47/4259 2.3 (L)  02/12/2013 3.6   Phosphorus (mg/dL)  Date Value  56/38/7564 5.5 (H)   Sodium (mmol/L)  Date Value  07/05/2020 143  02/12/2013 139   Assessment: 39 year old male here with encephalopathy s/t alcohol withdrawal. Patient intubated and sedated, requiring Dilaudid, midazolam and Precedex to maintain sedation.  Pharmacy to manage electrolytes.  Tube feeds: Vital AF 1.2 Cal at 45 mL/hr  + PROSource TF 90 mL 5 times daily per tube  Free water: 200 mL every 4 hours  12/1: Tube feeds on hold this AM given intolerance.   Goal of Therapy:  Electrolytes WNL  Plan:   No electrolytes replacement warranted today  Hypernatremia: borderline high  continue free water flushes at 200 mL per tube every 4 hours and re-check in am  F/u with AM labs 12/2  Tressie Ellis 07/05/2020 8:36 AM

## 2020-07-05 NOTE — Progress Notes (Signed)
Patient tongue noted to have thick white coating that does not come off with oral care.   Sonda Rumble, NP made aware.

## 2020-07-06 ENCOUNTER — Inpatient Hospital Stay: Payer: Medicaid Other

## 2020-07-06 LAB — BLOOD GAS, ARTERIAL
Acid-base deficit: 4.8 mmol/L — ABNORMAL HIGH (ref 0.0–2.0)
Bicarbonate: 20.5 mmol/L (ref 20.0–28.0)
FIO2: 0.3
MECHVT: 550 mL
O2 Saturation: 97.8 %
PEEP: 5 cmH2O
Patient temperature: 37
RATE: 16 resp/min
pCO2 arterial: 38 mmHg (ref 32.0–48.0)
pH, Arterial: 7.34 — ABNORMAL LOW (ref 7.350–7.450)
pO2, Arterial: 107 mmHg (ref 83.0–108.0)

## 2020-07-06 LAB — CBC WITH DIFFERENTIAL/PLATELET
Abs Immature Granulocytes: 0.12 10*3/uL — ABNORMAL HIGH (ref 0.00–0.07)
Basophils Absolute: 0 10*3/uL (ref 0.0–0.1)
Basophils Relative: 1 %
Eosinophils Absolute: 0.6 10*3/uL — ABNORMAL HIGH (ref 0.0–0.5)
Eosinophils Relative: 7 %
HCT: 27 % — ABNORMAL LOW (ref 39.0–52.0)
Hemoglobin: 9.1 g/dL — ABNORMAL LOW (ref 13.0–17.0)
Immature Granulocytes: 2 %
Lymphocytes Relative: 18 %
Lymphs Abs: 1.4 10*3/uL (ref 0.7–4.0)
MCH: 35.4 pg — ABNORMAL HIGH (ref 26.0–34.0)
MCHC: 33.7 g/dL (ref 30.0–36.0)
MCV: 105.1 fL — ABNORMAL HIGH (ref 80.0–100.0)
Monocytes Absolute: 0.7 10*3/uL (ref 0.1–1.0)
Monocytes Relative: 9 %
Neutro Abs: 5.1 10*3/uL (ref 1.7–7.7)
Neutrophils Relative %: 63 %
Platelets: 327 10*3/uL (ref 150–400)
RBC: 2.57 MIL/uL — ABNORMAL LOW (ref 4.22–5.81)
RDW: 14.7 % (ref 11.5–15.5)
WBC: 8 10*3/uL (ref 4.0–10.5)
nRBC: 0 % (ref 0.0–0.2)

## 2020-07-06 LAB — MAGNESIUM: Magnesium: 2 mg/dL (ref 1.7–2.4)

## 2020-07-06 LAB — GLUCOSE, CAPILLARY
Glucose-Capillary: 100 mg/dL — ABNORMAL HIGH (ref 70–99)
Glucose-Capillary: 101 mg/dL — ABNORMAL HIGH (ref 70–99)
Glucose-Capillary: 108 mg/dL — ABNORMAL HIGH (ref 70–99)
Glucose-Capillary: 176 mg/dL — ABNORMAL HIGH (ref 70–99)
Glucose-Capillary: 69 mg/dL — ABNORMAL LOW (ref 70–99)
Glucose-Capillary: 99 mg/dL (ref 70–99)
Glucose-Capillary: 99 mg/dL (ref 70–99)

## 2020-07-06 LAB — BASIC METABOLIC PANEL
Anion gap: 10 (ref 5–15)
BUN: 34 mg/dL — ABNORMAL HIGH (ref 6–20)
CO2: 25 mmol/L (ref 22–32)
Calcium: 9.3 mg/dL (ref 8.9–10.3)
Chloride: 109 mmol/L (ref 98–111)
Creatinine, Ser: 1.39 mg/dL — ABNORMAL HIGH (ref 0.61–1.24)
GFR, Estimated: >60 mL/min (ref >60)
Glucose, Bld: 111 mg/dL — ABNORMAL HIGH (ref 70–99)
Potassium: 3.3 mmol/L — ABNORMAL LOW (ref 3.5–5.1)
Sodium: 144 mmol/L (ref 135–145)

## 2020-07-06 LAB — PHOSPHORUS: Phosphorus: 5.3 mg/dL — ABNORMAL HIGH (ref 2.5–4.6)

## 2020-07-06 MED ORDER — VITAL AF 1.2 CAL PO LIQD
1000.0000 mL | ORAL | Status: DC
  Administered 2020-07-06 – 2020-07-11 (×5): 1000 mL

## 2020-07-06 MED ORDER — CLONIDINE HCL 0.1 MG PO TABS: 0.3000 mg | ORAL_TABLET | Freq: Four times a day (QID) | ORAL | Status: DC

## 2020-07-06 MED ORDER — GABAPENTIN 250 MG/5ML PO SOLN
600.0000 mg | Freq: Three times a day (TID) | ORAL | Status: DC
  Administered 2020-07-06 – 2020-07-30 (×70): 600 mg
  Filled 2020-07-06 (×82): qty 12

## 2020-07-06 MED ORDER — CLONIDINE HCL 0.1 MG PO TABS
0.1000 mg | ORAL_TABLET | Freq: Once | ORAL | Status: AC
  Administered 2020-07-06: 0.1 mg
  Filled 2020-07-06: qty 1

## 2020-07-06 MED ORDER — DEXTROSE 50 % IV SOLN
12.5000 g | INTRAVENOUS | Status: AC
  Administered 2020-07-06: 12.5 g via INTRAVENOUS
  Filled 2020-07-06: qty 50

## 2020-07-06 MED ORDER — VALPROIC ACID 250 MG/5ML PO SOLN
500.0000 mg | Freq: Two times a day (BID) | ORAL | Status: DC
  Administered 2020-07-06 – 2020-07-13 (×15): 500 mg
  Filled 2020-07-06 (×17): qty 10

## 2020-07-06 MED ORDER — CLONIDINE HCL 0.1 MG PO TABS
0.1000 mg | ORAL_TABLET | Freq: Four times a day (QID) | ORAL | Status: DC
  Administered 2020-07-06 – 2020-07-07 (×3): 0.3 mg
  Administered 2020-07-07: 0.2 mg
  Filled 2020-07-06 (×3): qty 3
  Filled 2020-07-06: qty 2

## 2020-07-06 MED ORDER — POTASSIUM CHLORIDE 20 MEQ PO PACK
40.0000 meq | PACK | Freq: Once | ORAL | Status: AC
  Administered 2020-07-06: 40 meq via ORAL
  Filled 2020-07-06: qty 2

## 2020-07-06 NOTE — Progress Notes (Addendum)
PHARMACY CONSULT NOTE  Pharmacy Consult for Electrolyte Monitoring and Replacement   Recent Labs: Potassium (mmol/L)  Date Value  07/06/2020 3.3 (L)  02/12/2013 3.9   Magnesium (mg/dL)  Date Value  83/66/2947 2.0   Calcium (mg/dL)  Date Value  65/46/5035 9.3   Calcium, Total (mg/dL)  Date Value  46/56/8127 8.7   Albumin (g/dL)  Date Value  51/70/0174 2.3 (L)  02/12/2013 3.6   Phosphorus (mg/dL)  Date Value  94/49/6759 5.3 (H)   Sodium (mmol/L)  Date Value  07/06/2020 144  02/12/2013 139   Assessment: 39 year old male here with encephalopathy s/t alcohol withdrawal. Patient intubated and sedated, requiring Dilaudid, midazolam and Precedex to maintain sedation.  Pharmacy to manage electrolytes.  Tube feeds: Vital AF 1.2 Cal at 45 mL/hr  + PROSource TF 90 mL 5 times daily per tube  Free water: 200 mL every 4 hours  Diuretics: IV Lasix 40 mg daily (started 12/1) Bowel regimen: Lactulose 20 g BID + IV Reglan  12/1: Tube feeds on hold this AM given intolerance.   Goal of Therapy:  Electrolytes WNL  Plan:   K 3.3, provider ordered PO KCl 40 mEq x 1  Hypernatremia resolved  continue free water flushes at 200 mL per tube every 4 hours and re-check in am  F/u with AM labs 12/2  Tressie Ellis 07/06/2020 8:46 AM

## 2020-07-06 NOTE — Progress Notes (Signed)
NAME:  Nicholas Valdez, MRN:  [id number], DOB:  June 07, 1981, LOS: 17 ADMISSION DATE:  06/19/2020, CONSULTATION DATE:  06/20/2020 REFERRING MD:  Dr. Alanda Slim, CHIEF COMPLAINT:  Altered Mental Status   Brief History   39 yo male significant history of ETOH and polysubstance abuse presented to the ED with altered mental status admitted to the ICU requiring precedex drip and ultimately intubation & mechanical ventilation.  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 Despite decreasing versed to 7 and completely stopping Propofol patient is only partially arousable. Will continue to wean sedation to RASS-0 with plan for SBT. 06/24/20- patient is being weaned off sedation as possible.  06/25/20-issues with hypoxia and asynchrony, patient has been intermittently febrile  11/25severe hypoxia, resp failure, failure to wean from ven  11/26-severe brain damage from ETOH abuse, severe DT'  11/27 failure to wean from ven  11/29- patient is difficult to wean with sedation, slight decrease in dilaudid patient tried to leave while intubated  11/30- plan to decrease sedation and repeat SBT with extubation if possible.   12/1- patient became severely aggitated, tachypneic, tachycardic and with heavy endotracheal secretions. He is febrile  12/2- Patient with extremely high tolerance to sedation have been unable to bring down dosing. Reviewed care plan with mother of patient Clydie Braun - plan for trache - ENT consult placed.   Procedures:  06/20/20 ETT >> 06/20/20 CVC 3 L >>  Significant Diagnostic Tests:  06/19/20 CT head >> no acute intracranial abnormality 06/20/20 US abdomen >> mild hepatic steatosis 06/21/20 Echocardiogram >> LVEF 65-70%, no wall motion abnormalities, no other abnormalities noted Micro Data:  06/19/20  COVID-19 >> negative 06/19/20 Influenza PCR A/B >> negative 06/20/20 MRSA PCR >> negative 06/20/20 tracheal aspirate >>  Antimicrobials:  06/21/20 Unasyn >>    Objective   Blood pressure 96/78, pulse (!) 50, temperature (!) 97.5 F (36.4 C), temperature source Esophageal, resp. rate 20, height 5\' 10"  (1.778 m), weight (!) 159.2 kg, SpO2 90 %.    Vent Mode: PCV FiO2 (%):  [30 %-50 %] 50 % Set Rate:  [20 bmp] 20 bmp PEEP:  [5 cmH20] 5 cmH20 Plateau Pressure:  [17 cmH20] 17 cmH20   Intake/Output Summary (Last 24 hours) at 07/06/2020 1458 Last data filed at 07/06/2020 1200 Gross per 24 hour  Intake 1234.95 ml  Output 6850 ml  Net -5615.05 ml   Filed Weights   06/30/20 0500 07/05/20 0444 07/06/20 0112  Weight: (!) 159.2 kg (!) 159 kg (!) 159.2 kg    Examination: General: Adult male, critically ill, lying in bed intubated & sedated requiring mechanical ventilation  HEENT: MM pink/moist, anicteric, atraumatic, neck supple Neuro: sedated, RASS -5, unable to follow commands, PERRL +3, MAE CV: s1s2 RRR, NSR on monitor, no r/m/g Pulm: Regular, non labored on MV GI: soft, rounded, bs x 4 GU: foley in place with yellow urine sediment Skin: scattered ecchymosis no rashes/lesions noted Extremities: warm/dry, pulses + 2 R/P, no edema noted    Assessment & Plan:  Acute Hypoxic Respiratory failure secondary to acute encephalopathy due to Alcohol withdrawal Suspected Aspiration Pneumonia Patient agitated due to ETOH withdrawal, obtunded with medication administration- unable to protect airway and intubated on 11/16. Concerns for aspiration PNA overnight due to copious secretions and increased FiO2 requirements, febrile 06/21/20 - Ventilator settings:  PCV 8 mL/kg, 30% FiO2, 5 PEEP, continue ventilator support & lung protective strategies - Wean PEEP & FiO2 as tolerated, maintain SpO2 > 90% - Head of bed elevated 30 degrees, VAP protocol in place - Plateau pressures less than 30 cm H20   - Intermittent chest x-ray & ABG PRN - Daily WUA with SBT as tolerated  - Ensure adequate pulmonary hygiene  - F/u cultures, trend PCT - PAD protocol in place: continue Dilaudid drip & Propofol & Versed drip - plan to titrate down on propofol drip as tolerated, leaving the patient on dilaudid & versed - continue Unasyn +vanc for aspiration pneumonia coverage - 12/2- discussed plan for trache with POA mother of patient Clydie Braun) - plan for tracheostomy placement - ENT consult - Discussed with Dr Andee Poles   Acute Encephalopathy due to ETOH withdrawal Hepatic Steatosis Polysubstance abuse - CIWA protocol - continue thiamine & folic acid - continue librium, phenobarb, precedex gtt, clonidine, dilaudid gtt, versed gtt, depakote patient with extremely elevated tolerance and severe aggitation - daily CMP - dc IVF resuscitation   Oral thrush    S/p eraxix >>diflucan x5d  Acute Kidney Injury  In the setting of sepsis. Baseline creatinine 0.88, creatinine on admission 1.54 this AM (11/17). Cr. Trending up 2.46/ serum CO2 16/ CK 722 - ordered sodium bicarb infusion for 12 h 150 meq at 75 mL/h - continue IVF hydration @ 50 mL/h - Strict I/O's: alert provider if UOP < 0.5 mL/kg/hr - Daily CMP, replace electrolytes PRN - Avoid nephrotoxic agents as able, ensure adequate renal perfusion  Sepsis with suspected Septic Shock due to suspected Aspiration Pneumonia Lactic: 3.3 ~ 2.5 ~ 2.1~1.1 PCT: 0.12~0.35 Increased secretions, FiO2 and pressor requirements overnight- 06/20/20 - Unasyn ordered per pharmacy for aspiration coverage - continue levophed drip, maintain MAP > 65 - continuous cardiac monitoring  Hypoalbuminia in the setting of ETOH abuse - continue trickle TF - continue reglan BID for suspected gastritis - Q 4 CBG  Best practice:  Diet: NPO Pain/Anxiety/Delirium protocol (if indicated): fentanyl & propofol & versed VAP protocol (if indicated): initiated DVT prophylaxis: enoxaparin  SQ GI prophylaxis: protonix Glucose control: monitor Q 4 Mobility: bedrest, mobilize as tolerated Code Status: FULL Family Communication:left VM 11/17, will update 11/18 PRN Disposition: ICU  Labs   CBC: Recent Labs  Lab 07/02/20 0445 07/03/20 0521 07/04/20 0530 07/05/20 0332 07/06/20 0322  WBC 11.6* 10.6* 8.8 7.9 8.0  NEUTROABS 8.4* 7.6 6.1 5.2 5.1  HGB 9.1* 9.3* 8.9* 8.9* 9.1*  HCT 27.7* 27.8* 26.1* 26.8* 27.0*  MCV 109.5* 106.5* 104.4* 106.3* 105.1*  PLT 313 299 267 290 327    Basic Metabolic Panel: Recent Labs  Lab 07/02/20 0445 07/03/20 0521 07/04/20 0530 07/05/20 0332 07/06/20 0322  NA 144 146* 143 143 144  K 3.7 3.9 3.7 3.7 3.3*  CL 119* 118* 112* 113* 109  CO2 20* 22 21* 23 25  GLUCOSE 129* 122* 115* 120* 111*  BUN 49* 47* 43* 41* 34*  CREATININE 1.45* 1.33* 1.36* 1.26* 1.39*  CALCIUM 9.1 9.1 9.2 8.9 9.3  MG 2.0 1.9 2.0 2.1 2.0  PHOS 4.8* 4.4 5.1* 5.5* 5.3*   GFR: Estimated Creatinine Clearance: 111.7 mL/min (A) (by C-G formula based on SCr of 1.39 mg/dL (H)). Recent Labs  Lab 07/03/20 0521 07/04/20 0530 07/05/20 0332 07/06/20 0322  PROCALCITON <0.10 <0.10 <0.10  --   WBC 10.6* 8.8 7.9 8.0    Liver Function Tests: Recent Labs  Lab 06/30/20 0424 07/01/20 0524  07/02/20 0445 07/04/20 0530  AST 66* 53*  --   --   ALT 56* 54*  --   --   ALKPHOS 45 45  --   --   BILITOT 0.3 0.4  --   --   PROT 6.6 6.5  --   --   ALBUMIN 2.2* 2.2* 2.2* 2.3*   No results for input(s): LIPASE, AMYLASE in the last 168 hours. Recent Labs  Lab 07/05/20 0332  AMMONIA 27    ABG    Component Value Date/Time   PHART 7.26 (L) 06/27/2020 0340   PCO2ART 41 06/27/2020 0340   PO2ART 78 (L) 06/27/2020 0340   HCO3 18.4 (L) 06/27/2020 0340   ACIDBASEDEF 7.9 (H) 06/26/2020 1140   O2SAT 93.3 06/27/2020 0340     Coagulation Profile: No results for input(s): INR, PROTIME in the last 168 hours.  Cardiac Enzymes: No results for input(s): CKTOTAL, CKMB, CKMBINDEX,  TROPONINI in the last 168 hours.  HbA1C: Hgb A1c MFr Bld  Date/Time Value Ref Range Status  06/26/2020 06:21 PM 5.3 4.8 - 5.6 % Final    Comment:    (NOTE) Pre diabetes:          5.7%-6.4%  Diabetes:              >6.4%  Glycemic control for   <7.0% adults with diabetes     CBG: Recent Labs  Lab 07/05/20 2307 07/06/20 0001 07/06/20 0328 07/06/20 0722 07/06/20 1128  GLUCAP 94 176* 99 69* 99    Critical care time: 33 minutes      Critical care provider statement:    Critical care time (minutes):  33   Critical care time was exclusive of:  Separately billable procedures and  treating other patients   Critical care was necessary to treat or prevent imminent or  life-threatening deterioration of the following conditions:  Altered mental status with encephalopathy, possible sepsis, aspiration pneumonia, substance abuse , alcoholism   Critical care was time spent personally by me on the following  activities:  Development of treatment plan with patient or surrogate,  discussions with consultants, evaluation of patient's response to  treatment, examination of patient, obtaining history from patient or  surrogate, ordering and performing treatments and interventions, ordering  and review of laboratory studies and re-evaluation of patient's condition   I assumed direction of critical care for this patient from another  provider in my specialty: no       Vida Rigger, M.D.  Pulmonary & Critical Care Medicine  [facility]

## 2020-07-07 DIAGNOSIS — J9601 Acute respiratory failure with hypoxia: Secondary | ICD-10-CM | POA: Diagnosis not present

## 2020-07-07 DIAGNOSIS — F10231 Alcohol dependence with withdrawal delirium: Secondary | ICD-10-CM | POA: Diagnosis not present

## 2020-07-07 DIAGNOSIS — G9341 Metabolic encephalopathy: Secondary | ICD-10-CM | POA: Diagnosis not present

## 2020-07-07 LAB — BASIC METABOLIC PANEL
Anion gap: 11 (ref 5–15)
BUN: 33 mg/dL — ABNORMAL HIGH (ref 6–20)
CO2: 25 mmol/L (ref 22–32)
Calcium: 9 mg/dL (ref 8.9–10.3)
Chloride: 107 mmol/L (ref 98–111)
Creatinine, Ser: 1.33 mg/dL — ABNORMAL HIGH (ref 0.61–1.24)
GFR, Estimated: >60 mL/min (ref >60)
Glucose, Bld: 115 mg/dL — ABNORMAL HIGH (ref 70–99)
Potassium: 3.2 mmol/L — ABNORMAL LOW (ref 3.5–5.1)
Sodium: 143 mmol/L (ref 135–145)

## 2020-07-07 LAB — CULTURE, RESPIRATORY W GRAM STAIN
Culture: NORMAL
Special Requests: NORMAL

## 2020-07-07 LAB — CBC WITH DIFFERENTIAL/PLATELET
Abs Immature Granulocytes: 0.12 10*3/uL — ABNORMAL HIGH (ref 0.00–0.07)
Basophils Absolute: 0.1 10*3/uL (ref 0.0–0.1)
Basophils Relative: 1 %
Eosinophils Absolute: 0.7 10*3/uL — ABNORMAL HIGH (ref 0.0–0.5)
Eosinophils Relative: 9 %
HCT: 27.9 % — ABNORMAL LOW (ref 39.0–52.0)
Hemoglobin: 9.2 g/dL — ABNORMAL LOW (ref 13.0–17.0)
Immature Granulocytes: 2 %
Lymphocytes Relative: 20 %
Lymphs Abs: 1.6 10*3/uL (ref 0.7–4.0)
MCH: 34.8 pg — ABNORMAL HIGH (ref 26.0–34.0)
MCHC: 33 g/dL (ref 30.0–36.0)
MCV: 105.7 fL — ABNORMAL HIGH (ref 80.0–100.0)
Monocytes Absolute: 0.7 10*3/uL (ref 0.1–1.0)
Monocytes Relative: 9 %
Neutro Abs: 4.8 10*3/uL (ref 1.7–7.7)
Neutrophils Relative %: 59 %
Platelets: 344 10*3/uL (ref 150–400)
RBC: 2.64 MIL/uL — ABNORMAL LOW (ref 4.22–5.81)
RDW: 14.6 % (ref 11.5–15.5)
WBC: 7.9 10*3/uL (ref 4.0–10.5)
nRBC: 0 % (ref 0.0–0.2)

## 2020-07-07 LAB — GLUCOSE, CAPILLARY
Glucose-Capillary: 100 mg/dL — ABNORMAL HIGH (ref 70–99)
Glucose-Capillary: 100 mg/dL — ABNORMAL HIGH (ref 70–99)
Glucose-Capillary: 81 mg/dL (ref 70–99)
Glucose-Capillary: 99 mg/dL (ref 70–99)
Glucose-Capillary: 93 mg/dL (ref 70–99)
Glucose-Capillary: 111 mg/dL — ABNORMAL HIGH (ref 70–99)

## 2020-07-07 LAB — MAGNESIUM: Magnesium: 2 mg/dL (ref 1.7–2.4)

## 2020-07-07 MED ORDER — MIDAZOLAM 50MG/50ML (1MG/ML) PREMIX INFUSION
0.5000 mg/h | INTRAVENOUS | Status: DC
  Administered 2020-07-07 – 2020-07-10 (×18): 10 mg/h via INTRAVENOUS
  Administered 2020-07-11: 5 mg/h via INTRAVENOUS
  Administered 2020-07-11 – 2020-07-13 (×8): 10 mg/h via INTRAVENOUS
  Filled 2020-07-07 (×28): qty 50

## 2020-07-07 MED ORDER — CLONIDINE HCL 0.1 MG PO TABS
0.2000 mg | ORAL_TABLET | Freq: Four times a day (QID) | ORAL | Status: DC
  Administered 2020-07-07 – 2020-07-08 (×2): 0.2 mg via ORAL
  Filled 2020-07-07: qty 2

## 2020-07-07 MED ORDER — MELATONIN 5 MG PO TABS: 5.0000 mg | ORAL_TABLET | Freq: Every day | ORAL | Status: DC

## 2020-07-07 MED ORDER — MELATONIN 5 MG PO TABS
5.0000 mg | ORAL_TABLET | Freq: Every day | ORAL | Status: DC
  Administered 2020-07-08 – 2020-07-29 (×23): 5 mg
  Filled 2020-07-07 (×23): qty 1

## 2020-07-07 MED ORDER — DIAZEPAM 2 MG PO TABS
5.0000 mg | ORAL_TABLET | Freq: Four times a day (QID) | ORAL | Status: DC
  Administered 2020-07-07: 5 mg via ORAL
  Filled 2020-07-07: qty 3

## 2020-07-07 MED ORDER — DIAZEPAM 5 MG PO TABS
5.0000 mg | ORAL_TABLET | Freq: Four times a day (QID) | ORAL | Status: DC
  Administered 2020-07-07 – 2020-07-08 (×2): 5 mg
  Administered 2020-07-08: 2 mg
  Administered 2020-07-08 – 2020-07-17 (×34): 5 mg
  Filled 2020-07-07 (×14): qty 1
  Filled 2020-07-07: qty 3
  Filled 2020-07-07 (×18): qty 1
  Filled 2020-07-07: qty 3
  Filled 2020-07-07 (×3): qty 1

## 2020-07-07 MED ORDER — CLONIDINE HCL 0.1 MG PO TABS: 0.2000 mg | ORAL_TABLET | Freq: Two times a day (BID) | ORAL | Status: DC

## 2020-07-07 MED ORDER — CLONIDINE HCL 0.1 MG PO TABS
0.2000 mg | ORAL_TABLET | Freq: Three times a day (TID) | ORAL | Status: DC
  Filled 2020-07-07: qty 2

## 2020-07-07 MED ORDER — POTASSIUM CHLORIDE 20 MEQ PO PACK
40.0000 meq | PACK | ORAL | Status: AC
  Administered 2020-07-07 (×2): 40 meq
  Filled 2020-07-07 (×2): qty 2

## 2020-07-07 MED ORDER — CYANOCOBALAMIN 1000 MCG/ML IJ SOLN
1000.0000 ug | Freq: Every day | INTRAMUSCULAR | Status: DC
  Administered 2020-07-07 – 2020-07-11 (×5): 1000 ug via INTRAMUSCULAR
  Filled 2020-07-07 (×5): qty 1

## 2020-07-07 MED ORDER — CLONIDINE HCL 0.1 MG PO TABS: 0.2000 mg | ORAL_TABLET | Freq: Every day | ORAL | Status: DC

## 2020-07-07 MED ORDER — SODIUM CHLORIDE 0.9 % IV SOLN
0.5000 mg/h | INTRAVENOUS | Status: DC
  Administered 2020-07-07: 10 mg/h via INTRAVENOUS
  Filled 2020-07-07: qty 10

## 2020-07-07 MED ORDER — THIAMINE HCL 100 MG PO TABS
100.0000 mg | ORAL_TABLET | Freq: Every day | ORAL | Status: DC
  Administered 2020-07-08 – 2020-07-30 (×23): 100 mg
  Filled 2020-07-07 (×23): qty 1

## 2020-07-07 NOTE — Consult Note (Signed)
Nicholas Valdez, Nicholas Valdez [id number] Dec 13, 1980 Erin Fulling, MD  Reason for Consult: tracheostomy tube placement  HPI: 39 y.o. male with history of polysubstance abuse and encephalopathy s/p alcohol withdrawal unable to be weaned from ventilatory support due to agitation.  Asked to evaluate for tracheostomy tube placement.  Allergies:  Allergies  Allergen Reactions  . Ondansetron Hives  . Promethazine Hives  . Penicillins Hives    Did it involve swelling of the face/tongue/throat, SOB, or low BP? Yes Did it involve sudden or severe rash/hives, skin peeling, or any reaction on the inside of your mouth or nose? Yes Did you need to seek medical attention at a hospital or doctor's office? Yes When did it last happen?childhood If all above answers are "NO", may proceed with cephalosporin use.  Marland Kitchen Zofran [Ondansetron Hcl] Hives    ROS: patient sedated  PMH:  Past Medical History:  Diagnosis Date  . Alcohol abuse   . Drug abuse (HCC)   . Hepatitis C     FH:  Family History  Problem Relation Age of Onset  . Hypertension Other     SH:  Social History   Socioeconomic History  . Marital status: Single    Spouse name: Not on file  . Number of children: Not on file  . Years of education: Not on file  . Highest education level: Not on file  Occupational History  . Not on file  Tobacco Use  . Smoking status: Current Every Day Smoker  . Smokeless tobacco: Never Used  Vaping Use  . Vaping Use: Former  Substance and Sexual Activity  . Alcohol use: Yes    Comment: Pt states that he usually drinks a pint of whiskey per day but has had none today.   . Drug use: Yes    Types: Marijuana  . Sexual activity: Not on file  Other Topics Concern  . Not on file  Social History Narrative  . Not on file   Social Determinants of Health   Financial Resource Strain:   . Difficulty of Paying Living Expenses: Not on file  Food Insecurity:   . Worried About Programme researcher, broadcasting/film/video in the  Last Year: Not on file  . Ran Out of Food in the Last Year: Not on file  Transportation Needs:   . Lack of Transportation (Medical): Not on file  . Lack of Transportation (Non-Medical): Not on file  Physical Activity:   . Days of Exercise per Week: Not on file  . Minutes of Exercise per Session: Not on file  Stress:   . Feeling of Stress : Not on file  Social Connections:   . Frequency of Communication with Friends and Family: Not on file  . Frequency of Social Gatherings with Friends and Family: Not on file  . Attends Religious Services: Not on file  . Active Member of Clubs or Organizations: Not on file  . Attends Banker Meetings: Not on file  . Marital Status: Not on file  Intimate Partner Violence:   . Fear of Current or Ex-Partner: Not on file  . Emotionally Abused: Not on file  . Physically Abused: Not on file  . Sexually Abused: Not on file    PSH: History reviewed. No pertinent surgical history.  Physical  Exam:  GEN-  Sedated with hand retraints NEURO- unable to be obtained EARS- external ears clear NOSE- clear anteriorly, NGT on right NECK- No LAD, normal landmarks OC/OP-  ETT secure and patent RESP- ventilator support  CARD-  RRR   A/P: Respiratory failure with need for long term tracheostomy.  Plan:  Candidate for tracheostomy tube placement.  Scheduled for Monday.  Called mom Nicholas Valdez and discussed tracheostomy tube placement and risks and benefits and normal post-operative course.  She is amenable to proceed.  Please obtain phone consent.  Please hold anticoagulation and tube feeds Sunday night per protocol.   Nicholas Valdez 07/07/2020 12:05 PM

## 2020-07-07 NOTE — Progress Notes (Signed)
PHARMACY CONSULT NOTE  Pharmacy Consult for Electrolyte Monitoring and Replacement   Recent Labs: Potassium (mmol/L)  Date Value  07/07/2020 3.2 (L)  02/12/2013 3.9   Magnesium (mg/dL)  Date Value  51/88/4166 2.0   Calcium (mg/dL)  Date Value  02/02/1600 9.0   Calcium, Total (mg/dL)  Date Value  09/32/3557 8.7   Albumin (g/dL)  Date Value  32/20/2542 2.3 (L)  02/12/2013 3.6   Phosphorus (mg/dL)  Date Value  70/62/3762 5.3 (H)   Sodium (mmol/L)  Date Value  07/07/2020 143  02/12/2013 139   Assessment: 39 year old male here with encephalopathy s/t alcohol withdrawal. Patient intubated and sedated, requiring Dilaudid, midazolam and Precedex to maintain sedation.  Pharmacy to manage electrolytes.  Tube feeds: Vital AF 1.2 Cal at 20 mL/hr  + PROSource TF 90 mL 5 times daily per tube  Free water: 200 mL every 4 hours (d/c 12/2)  Diuretics: IV Lasix 40 mg daily (started 12/1) Bowel regimen: Lactulose 20 g BID (d/c 12/2) + IV Reglan   Goal of Therapy:  Electrolytes WNL  Plan:   K 3.3 >> 3.2 despite KCl 40 mEq x 1 yesterday. Will order KCl 40 mEq x 2 today  Hypernatremia resolved  Currently off free water flushes, monitor closely  F/u with AM labs 12/4  Tressie Ellis 07/07/2020 8:14 AM

## 2020-07-07 NOTE — Progress Notes (Signed)
NAME:  Nicholas Valdez, MRN:  [id number], DOB:  August 07, 1980, LOS: 70 ADMISSION DATE:  06/19/2020, CONSULTATION DATE:  06/20/2020 REFERRING MD:  Dr. Cyndia Skeeters, CHIEF COMPLAINT:  Altered Mental Status   Brief History   39 yo male significant history of ETOH and polysubstance abuse presented to the ED with altered mental status admitted to the ICU requiring precedex drip and ultimately intubation & mechanical ventilation.  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 Despite decreasing versed to 7 and completely stopping Propofol patient is only partially arousable. Will continue to wean sedation to RASS-0 with plan for SBT. 06/24/20- patient is being weaned off sedation as possible.  06/25/20-issues with hypoxia and asynchrony, patient has been intermittently febrile 11/25severe hypoxia, resp failure, failure to wean from ven 11/26-severe brain damage from ETOH abuse, severe DT' 11/27 failure to wean from ven 11/29- patient is difficult to wean with sedation, slight decrease in dilaudid patient tried to leave while intubated 11/30- plan to decrease sedation and repeat SBT with extubation if possible.  12/1- patient became severely aggitated, tachypneic, tachycardic and with heavy endotracheal secretions. He is febrile 12/2- Patient with extremely high tolerance to sedation have been unable to bring down dosing. Reviewed care plan with mother of patient [name] - plan for trache - ENT consult placed. 07/07/2020: Tracheostomy planned for 12/6, remains encephalopathic, does not follow commands, agitation when sedation is lightened.   Procedures:  06/20/20 ETT >> 06/20/20 CVC 3 L >>  Significant Diagnostic Tests:  06/19/20 CT head >> no acute intracranial abnormality 06/20/20 US abdomen >> mild hepatic steatosis 06/21/20  Echocardiogram >> LVEF 65-70%, no wall motion abnormalities, no other abnormalities noted Micro Data:  06/19/20 COVID-19 >> negative 06/19/20 Influenza PCR A/B >> negative 06/20/20 MRSA PCR >> negative 06/20/20 tracheal aspirate >> negative  Antimicrobials:  06/21/20 Unasyn >>12/3 07/05/2020 fluconazole>>  Scheduled Meds: . chlorhexidine gluconate (MEDLINE KIT)  15 mL Mouth Rinse BID  . Chlorhexidine Gluconate Cloth  6 each Topical Daily  . cloNIDine  0.2 mg Oral Q6H   Followed by  . [START ON 07/08/2020] cloNIDine  0.2 mg Oral Q8H   Followed by  . [START ON 07/09/2020] cloNIDine  0.2 mg Oral Q12H   Followed by  . [START ON 07/11/2020] cloNIDine  0.2 mg Oral Daily  . cyanocobalamin  1,000 mcg Intramuscular Daily  . diazepam  5 mg Per Tube Q6H  . enoxaparin (LOVENOX) injection  0.5 mg/kg Subcutaneous Q24H  . feeding supplement (PROSource TF)  90 mL Per Tube 5 X Daily  . feeding supplement (VITAL AF 1.2 CAL)  1,000 mL Per Tube Q24H  . fluconazole  100 mg Per Tube QHS  . folic acid  1 mg Per Tube Daily  . furosemide  40 mg Intravenous Daily  . gabapentin  600 mg Per Tube Q8H  . insulin aspart  0-15 Units Subcutaneous Q4H  . mouth rinse  15 mL Mouth Rinse 10 times per day  . melatonin  5 mg Per Tube QHS  . multivitamin with minerals  1 tablet Per Tube Daily  . nystatin   Topical BID  . pantoprazole sodium  40 mg Per Tube Daily  . polyvinyl alcohol  1 drop Both Eyes BID  . [START ON 07/08/2020] thiamine  100 mg Per Tube Daily  .  valproic acid  500 mg Per Tube BID   Continuous Infusions: . HYDROmorphone 4 mg/hr (07/07/20 1229)  . midazolam 10 mg/hr (07/07/20 2027)   PRN Meds:.acetaminophen **OR** acetaminophen, docusate, HYDROmorphone, midazolam, polyethylene glycol   Objective   Blood pressure (!) 100/57, pulse 95, temperature 100 F (37.8 C), resp. rate 11, height 5' 10" (1.778 m), weight (!) 150.1 kg, SpO2 95 %.    Vent Mode: PCV FiO2 (%):  [45 %-50 %] 45 % Set Rate:  [20  bmp] 20 bmp PEEP:  [5 cmH20] 5 cmH20 Pressure Support:  [8 cmH20] [place] Pressure:  [16 cmH20] 16 cmH20   Intake/Output Summary (Last 24 hours) at 07/07/2020 2035 Last data filed at 07/07/2020 1800 Gross per 24 hour  Intake 1193.32 ml  Output 3650 ml  Net -2456.68 ml   Filed Weights   07/05/20 0444 07/06/20 0112 07/07/20 0328  Weight: (!) 159 kg (!) 159.2 kg (!) 150.1 kg    Examination: General: Adult male, critically ill, lying in bed intubated & sedated requiring mechanical ventilation  HEENT: MM pink/moist, anicteric, atraumatic, neck supple, no thrush Neuro: sedated, RASS -3, unable to follow commands, PERRL +3, MAE CV: s1s2 RRR, NSR on monitor, no r/m/g Pulm: Regular, non labored on MV GI: soft, rounded, bs x 4 GU: foley in place with yellow urine Skin: scattered ecchymosis no rashes/lesions noted, spider angiomas noted Extremities: warm/dry, pulses + 2 R/P, LE edema +2  Chest x-ray 12/2, atelectasis/infiltrate left lower lobe:    Assessment & Plan:  Acute Hypoxic Respiratory failure secondary to acute encephalopathy due to Alcohol withdrawal Suspected Aspiration Pneumonia Patient agitated due to ETOH withdrawal, obtunded with medication administration- unable to protect airway and intubated on 11/16. Concerns for aspiration PNA overnight due to copious secretions and increased FiO2 requirements, febrile 06/21/20 - Continue ventilator support - Wean PEEP & FiO2 as tolerated, maintain SpO2 > 90% - Head of bed elevated 30 degrees, VAP protocol in place - Intermittent chest x-ray & ABG PRN - Daily WUA with SBT as tolerated  - Ensure adequate pulmonary hygiene  - PAD protocol in place: continue Dilaudid drip & Versed drip - Added Valium via tube wean Versed off - Plan to start weaning Dilaudid -Tracheostomy 12/6   Acute Encephalopathy due to ETOH withdrawal Hepatic Steatosis Polysubstance abuse - CIWA protocol - continue thiamine & folic acid - continue  Depakote, add Valium standing dose - Discontinued phenobarbital - daily CMP  Oral thrush    S/p eraxix >>diflucan x5d  Acute Kidney Injury  In the setting of sepsis. Baseline creatinine 0.88, creatinine on admission 1.54 this AM (11/17). Cr. Trending up 2.46/ serum CO2 16/ CK 722 - ordered sodium bicarb infusion for 12 h 150 meq at 75 mL/h - continue IVF hydration @ 50 mL/h - Strict I/O's: alert provider if UOP < 0.5 mL/kg/hr - Daily CMP, replace electrolytes PRN - Avoid nephrotoxic agents as able, ensure adequate renal perfusion  Sepsis with suspected Septic Shock due to suspected Aspiration Pneumonia RESOLVED Lactic: 3.3 ~ 2.5 ~ 2.1~1.1 PCT: 0.12~0.35 Increased secretions, FiO2 and pressor requirements overnight- 06/20/20 - Unasyn ordered per pharmacy for aspiration coverage>> completed 12/3 - continue levophed drip, maintain MAP > 65 - continuous cardiac monitoring  Hypoalbuminia in the setting of ETOH abuse - continue trickle TF - continue reglan BID for suspected gastroparesis   Best practice:  Diet: NPO Pain/Anxiety/Delirium protocol (if indicated): fentanyl & propofol & versed VAP protocol (if indicated): initiated DVT prophylaxis: enoxaparin SQ GI  prophylaxis: protonix Glucose control: monitor Q 4 Mobility: bedrest, mobilize as tolerated Code Status: FULL Family Communication: No family at bedside Disposition: ICU  Labs   CBC: Recent Labs  Lab 07/03/20 0521 07/04/20 0530 07/05/20 0332 07/06/20 0322 07/07/20 0319  WBC 10.6* 8.8 7.9 8.0 7.9  NEUTROABS 7.6 6.1 5.2 5.1 4.8  HGB 9.3* 8.9* 8.9* 9.1* 9.2*  HCT 27.8* 26.1* 26.8* 27.0* 27.9*  MCV 106.5* 104.4* 106.3* 105.1* 105.7*  PLT 299 267 290 327 891    Basic Metabolic Panel: Recent Labs  Lab 07/02/20 0445 07/02/20 0445 07/03/20 0521 07/04/20 0530 07/05/20 0332 07/06/20 0322 07/07/20 0319  NA 144   < > 146* 143 143 144 143  K 3.7   < > 3.9 3.7 3.7 3.3* 3.2*  CL 119*   < > 118* 112* 113* 109  107  CO2 20*   < > 22 21* _0 GLUCOSE 129*   < > 122* 115* 120* 111* 115*  BUN 49*   < > 47* 43* 41* 34* 33*  CREATININE 1.45*   < > 1.33* 1.36* 1.26* 1.39* 1.33*  CALCIUM 9.1   < > 9.1 9.2 8.9 9.3 9.0  MG 2.0   < > 1.9 2.0 2.1 2.0 2.0  PHOS 4.8*  --  4.4 5.1* 5.5* 5.3*  --    < > = values in this interval not displayed.   GFR: Estimated Creatinine Clearance: 112.7 mL/min (A) (by C-G formula based on SCr of 1.33 mg/dL (H)). Recent Labs  Lab 07/03/20 0521 07/03/20 0521 07/04/20 0530 07/05/20 0332 07/06/20 0322 07/07/20 0319  PROCALCITON <0.10  --  <0.10 <0.10  --   --   WBC 10.6*   < > 8.8 7.9 8.0 7.9   < > = values in this interval not displayed.    Liver Function Tests: Recent Labs  Lab 07/01/20 0524 07/02/20 0445 07/04/20 0530  AST 53*  --   --   ALT 54*  --   --   ALKPHOS 45  --   --   BILITOT 0.4  --   --   PROT 6.5  --   --   ALBUMIN 2.2* 2.2* 2.3*   No results for input(s): LIPASE, AMYLASE in the last 168 hours. Recent Labs  Lab 07/05/20 0332  AMMONIA 27    ABG    Component Value Date/Time   PHART 7.26 (L) 06/27/2020 0340   PCO2ART 41 06/27/2020 0340   PO2ART 78 (L) 06/27/2020 0340   HCO3 18.4 (L) 06/27/2020 0340   ACIDBASEDEF 7.9 (H) 06/26/2020 1140   O2SAT 93.3 06/27/2020 0340     Coagulation Profile: No results for input(s): INR, PROTIME in the last 168 hours.  Cardiac Enzymes: No results for input(s): CKTOTAL, CKMB, CKMBINDEX, TROPONINI in the last 168 hours.  HbA1C: Hgb A1c MFr Bld  Date/Time Value Ref Range Status  06/26/2020 06:21 PM 5.3 4.8 - 5.6 % Final    Comment:    (NOTE) Pre diabetes:          5.7%-6.4%  Diabetes:              >6.4%  Glycemic control for   <7.0% adults with diabetes     CBG: Recent Labs  Lab 07/07/20 0313 07/07/20 0726 07/07/20 1126 07/07/20 1558 07/07/20 2015  GLUCAP 100* 100* 81 99 93    Critical care time: 40 minutes    Multidisciplinary rounds rounds were performed with ICU  team  Renold Don, MD [facility]   *This note was dictated using voice recognition software/Dragon.  Despite best efforts to proofread, errors can occur which can change the meaning.  Any change was purely unintentional.

## 2020-07-08 ENCOUNTER — Inpatient Hospital Stay: Payer: Self-pay

## 2020-07-08 DIAGNOSIS — G9341 Metabolic encephalopathy: Secondary | ICD-10-CM | POA: Diagnosis not present

## 2020-07-08 DIAGNOSIS — F10231 Alcohol dependence with withdrawal delirium: Secondary | ICD-10-CM | POA: Diagnosis not present

## 2020-07-08 DIAGNOSIS — J9601 Acute respiratory failure with hypoxia: Secondary | ICD-10-CM | POA: Diagnosis not present

## 2020-07-08 LAB — CBC WITH DIFFERENTIAL/PLATELET
Abs Immature Granulocytes: 0.08 10*3/uL — ABNORMAL HIGH (ref 0.00–0.07)
Basophils Absolute: 0.1 10*3/uL (ref 0.0–0.1)
Basophils Relative: 1 %
Eosinophils Absolute: 0.6 10*3/uL — ABNORMAL HIGH (ref 0.0–0.5)
Eosinophils Relative: 9 %
HCT: 30.8 % — ABNORMAL LOW (ref 39.0–52.0)
Hemoglobin: 10 g/dL — ABNORMAL LOW (ref 13.0–17.0)
Immature Granulocytes: 1 %
Lymphocytes Relative: 25 %
Lymphs Abs: 1.8 10*3/uL (ref 0.7–4.0)
MCH: 35.2 pg — ABNORMAL HIGH (ref 26.0–34.0)
MCHC: 32.5 g/dL (ref 30.0–36.0)
MCV: 108.5 fL — ABNORMAL HIGH (ref 80.0–100.0)
Monocytes Absolute: 0.7 10*3/uL (ref 0.1–1.0)
Monocytes Relative: 10 %
Neutro Abs: 3.9 10*3/uL (ref 1.7–7.7)
Neutrophils Relative %: 54 %
Platelets: 376 10*3/uL (ref 150–400)
RBC: 2.84 MIL/uL — ABNORMAL LOW (ref 4.22–5.81)
RDW: 14.4 % (ref 11.5–15.5)
WBC: 7.1 10*3/uL (ref 4.0–10.5)
nRBC: 0 % (ref 0.0–0.2)

## 2020-07-08 LAB — GLUCOSE, CAPILLARY
Glucose-Capillary: 100 mg/dL — ABNORMAL HIGH (ref 70–99)
Glucose-Capillary: 103 mg/dL — ABNORMAL HIGH (ref 70–99)
Glucose-Capillary: 83 mg/dL (ref 70–99)
Glucose-Capillary: 86 mg/dL (ref 70–99)
Glucose-Capillary: 92 mg/dL (ref 70–99)
Glucose-Capillary: 94 mg/dL (ref 70–99)

## 2020-07-08 LAB — MAGNESIUM: Magnesium: 2 mg/dL (ref 1.7–2.4)

## 2020-07-08 LAB — PHOSPHORUS: Phosphorus: 5.5 mg/dL — ABNORMAL HIGH (ref 2.5–4.6)

## 2020-07-08 LAB — BASIC METABOLIC PANEL
Anion gap: 13 (ref 5–15)
BUN: 37 mg/dL — ABNORMAL HIGH (ref 6–20)
CO2: 25 mmol/L (ref 22–32)
Calcium: 9 mg/dL (ref 8.9–10.3)
Chloride: 107 mmol/L (ref 98–111)
Creatinine, Ser: 1.35 mg/dL — ABNORMAL HIGH (ref 0.61–1.24)
GFR, Estimated: >60 mL/min (ref >60)
Glucose, Bld: 101 mg/dL — ABNORMAL HIGH (ref 70–99)
Potassium: 3.4 mmol/L — ABNORMAL LOW (ref 3.5–5.1)
Sodium: 145 mmol/L (ref 135–145)

## 2020-07-08 MED ORDER — CLONIDINE HCL 0.1 MG PO TABS: 0.2000 mg | ORAL_TABLET | Freq: Every day | ORAL | Status: DC

## 2020-07-08 MED ORDER — POTASSIUM CHLORIDE 20 MEQ PO PACK
40.0000 meq | PACK | Freq: Once | ORAL | Status: AC
  Administered 2020-07-08: 40 meq via ORAL
  Filled 2020-07-08: qty 2

## 2020-07-08 MED ORDER — CLONIDINE HCL 0.1 MG PO TABS
0.2000 mg | ORAL_TABLET | Freq: Every day | ORAL | Status: AC
  Administered 2020-07-11: 0.2 mg
  Filled 2020-07-08: qty 2

## 2020-07-08 MED ORDER — CLONIDINE HCL 0.1 MG PO TABS
0.2000 mg | ORAL_TABLET | Freq: Four times a day (QID) | ORAL | Status: AC
  Administered 2020-07-08: 0.2 mg
  Filled 2020-07-08: qty 2

## 2020-07-08 MED ORDER — CLONIDINE HCL 0.1 MG PO TABS: 0.2000 mg | ORAL_TABLET | Freq: Two times a day (BID) | ORAL | Status: DC

## 2020-07-08 MED ORDER — DIAZEPAM 2 MG PO TABS
3.0000 mg | ORAL_TABLET | Freq: Once | ORAL | Status: AC
  Administered 2020-07-08: 3 mg via ORAL
  Filled 2020-07-08: qty 2

## 2020-07-08 MED ORDER — CLONIDINE HCL 0.1 MG PO TABS
0.2000 mg | ORAL_TABLET | Freq: Three times a day (TID) | ORAL | Status: DC
  Administered 2020-07-08: 0.2 mg via ORAL
  Filled 2020-07-08: qty 2

## 2020-07-08 MED ORDER — CLONIDINE HCL 0.1 MG PO TABS
0.2000 mg | ORAL_TABLET | Freq: Three times a day (TID) | ORAL | Status: AC
  Administered 2020-07-09 (×2): 0.2 mg
  Filled 2020-07-08 (×2): qty 2

## 2020-07-08 MED ORDER — CLONIDINE HCL 0.1 MG PO TABS
0.2000 mg | ORAL_TABLET | Freq: Two times a day (BID) | ORAL | Status: AC
  Administered 2020-07-09 – 2020-07-10 (×2): 0.2 mg
  Filled 2020-07-08 (×2): qty 2

## 2020-07-08 NOTE — Progress Notes (Signed)
PHARMACY CONSULT NOTE  Pharmacy Consult for Electrolyte Monitoring and Replacement   Recent Labs: Potassium (mmol/L)  Date Value  07/08/2020 3.4 (L)  02/12/2013 3.9   Magnesium (mg/dL)  Date Value  44/08/270 2.0   Calcium (mg/dL)  Date Value  53/66/4403 9.0   Calcium, Total (mg/dL)  Date Value  47/42/5956 8.7   Albumin (g/dL)  Date Value  38/75/6433 2.3 (L)  02/12/2013 3.6   Phosphorus (mg/dL)  Date Value  29/51/8841 5.5 (H)   Sodium (mmol/L)  Date Value  07/08/2020 145  02/12/2013 139   Assessment: 39 year old male here with encephalopathy s/t alcohol withdrawal. Patient intubated and sedated, requiring Dilaudid, midazolam and Precedex to maintain sedation.  Pharmacy to manage electrolytes.  Tube feeds: Vital AF 1.2 Cal at 20 mL/hr  + PROSource TF 90 mL 5 times daily per tube  Free water: 200 mL every 4 hours (d/c 12/2)  Diuretics: IV Lasix 40 mg daily (started 12/1) Bowel regimen: Lactulose 20 g BID (d/c 12/2) + IV Reglan   Goal of Therapy:  Electrolytes WNL  Plan:   Potassium replaced this morning  Continue to follow  Pricilla Riffle, PharmD 07/08/2020 9:15 AM

## 2020-07-08 NOTE — Progress Notes (Signed)
NAME:  Nicholas Valdez, MRN:  [id number], DOB:  04-10-1981, LOS: 68 ADMISSION DATE:  06/19/2020, INITIAL CONSULTATION DATE:  06/20/2020 REFERRING MD:  Dr. Cyndia Skeeters, CHIEF COMPLAINT:  Altered Mental Status   Brief History   39 yo male significant history of ETOH and polysubstance abuse presented to the ED with altered mental status admitted to the ICU requiring precedex drip and ultimately intubation & mechanical ventilation.  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 Despite decreasing versed to 7 and completely stopping Propofol patient is only partially arousable. Will continue to wean sedation to RASS-0 with plan for SBT. 06/24/20- patient is being weaned off sedation as possible.  06/25/20-issues with hypoxia and asynchrony, patient has been intermittently febrile 11/25severe hypoxia, resp failure, failure to wean from ven 11/26-severe brain damage from ETOH abuse, severe DT' 11/27 failure to wean from ven 11/29- patient is difficult to wean with sedation, slight decrease in dilaudid patient tried to leave while intubated 11/30- plan to decrease sedation and repeat SBT with extubation if possible.  12/1- patient became severely aggitated, tachypneic, tachycardic and with heavy endotracheal secretions. He is febrile 12/2- Patient with extremely high tolerance to sedation have been unable to bring down dosing. Reviewed care plan with mother of patient [name] - plan for trache - ENT consult placed. 07/07/2020: Tracheostomy planned for 12/6, remains encephalopathic, does not follow commands, agitation when sedation is lightened. 07/08/2020: Moves all fours, still on vent, less agitated   Procedures:  06/20/20 ETT >> 06/20/20 CVC 3 L >> will need PICC line  Significant Diagnostic Tests:  06/19/20 CT head >> no  acute intracranial abnormality 06/20/20 US abdomen >> mild hepatic steatosis 06/21/20 Echocardiogram >> LVEF 65-70%, no wall motion abnormalities, no other abnormalities noted Micro Data:  06/19/20 COVID-19 >> negative 06/19/20 Influenza PCR A/B >> negative 06/20/20 MRSA PCR >> negative 06/20/20 tracheal aspirate >> negative  Antimicrobials:  06/21/20 Unasyn >>12/3 07/05/2020 fluconazole>>  Scheduled Meds: . chlorhexidine gluconate (MEDLINE KIT)  15 mL Mouth Rinse BID  . Chlorhexidine Gluconate Cloth  6 each Topical Daily  . cloNIDine  0.2 mg Oral Q8H   Followed by  . [START ON 07/09/2020] cloNIDine  0.2 mg Oral Q12H   Followed by  . [START ON 07/11/2020] cloNIDine  0.2 mg Oral Daily  . cyanocobalamin  1,000 mcg Intramuscular Daily  . diazepam  5 mg Per Tube Q6H  . enoxaparin (LOVENOX) injection  0.5 mg/kg Subcutaneous Q24H  . feeding supplement (PROSource TF)  90 mL Per Tube 5 X Daily  . feeding supplement (VITAL AF 1.2 CAL)  1,000 mL Per Tube Q24H  . fluconazole  100 mg Per Tube QHS  . folic acid  1 mg Per Tube Daily  . furosemide  40 mg Intravenous Daily  . gabapentin  600 mg Per Tube Q8H  . insulin aspart  0-15 Units Subcutaneous Q4H  . mouth rinse  15 mL Mouth Rinse 10 times per day  . melatonin  5 mg Per Tube QHS  . multivitamin with minerals  1 tablet Per Tube Daily  . nystatin   Topical BID  . pantoprazole sodium  40 mg Per Tube Daily  . polyvinyl alcohol  1 drop Both Eyes BID  . thiamine  100 mg Per Tube Daily  . valproic acid  500  mg Per Tube BID   Continuous Infusions: . HYDROmorphone 5 mg/hr (07/08/20 1207)  . midazolam 10 mg/hr (07/08/20 1208)   PRN Meds:.acetaminophen **OR** acetaminophen, docusate, HYDROmorphone, midazolam, polyethylene glycol   Objective   Blood pressure (!) 107/59, pulse 61, temperature (!) 97.5 F (36.4 C), temperature source Esophageal, resp. rate 20, height 5' 10"  (1.778 m), weight (!) 150.1 kg, SpO2 93 %.    Vent Mode: PCV FiO2  (%):  [35 %-45 %] 35 % Set Rate:  [20 bmp] 20 bmp PEEP:  [5 cmH20] [place] Pressure:  [16 cmH20] 16 cmH20   Intake/Output Summary (Last 24 hours) at 07/08/2020 1729 Last data filed at 07/08/2020 1131 Gross per 24 hour  Intake 841.97 ml  Output 4800 ml  Net -3958.03 ml   Filed Weights   07/05/20 0444 07/06/20 0112 07/07/20 0328  Weight: (!) 159 kg (!) 159.2 kg (!) 150.1 kg    Examination: General: Adult male, critically ill, lying in bed intubated & sedated requiring mechanical ventilation  HEENT: MM pink/moist, anicteric, atraumatic, neck supple, no thrush Neuro: sedated, RASS -3, unable to follow commands, PERRL, moves all fours  CV: s1s2 RRR, NSR on monitor, no r/m/g Pulm: Regular, non labored on MV GI: soft, rounded, bs x 4 GU: foley in place with yellow urine Skin: scattered ecchymosis no rashes/lesions noted, spider angiomas noted Extremities: warm/dry, pulses + 2 R/P, LE edema +2  Assessment & Plan:  Acute Hypoxic Respiratory failure secondary to acute encephalopathy due to Alcohol withdrawal Suspected Aspiration Pneumonia Patient agitated due to ETOH withdrawal, obtunded with medication administration- unable to protect airway and intubated on 11/16. Concerns for aspiration PNA overnight due to copious secretions and increased FiO2 requirements, febrile 06/21/20 - Continue ventilator support - Wean PEEP & FiO2 as tolerated, maintain SpO2 > 90% - Head of bed elevated 30 degrees, VAP protocol in place - Intermittent chest x-ray & ABG PRN - Daily WUA with SBT as tolerated  - Ensure adequate pulmonary hygiene  - PAD protocol in place: continue Dilaudid drip & Versed drip - Added Valium via tube 12/3 to wean Versed off -Weaning Dilaudid -Tracheostomy 12/6   Acute Encephalopathy due to ETOH withdrawal Hepatic Steatosis Polysubstance abuse - CIWA protocol - continue thiamine & folic acid - continue Depakote, add Valium standing dose - Discontinued phenobarbital  - daily CMP  Oral thrush    S/p eraxix >>diflucan x5d  Acute Kidney Injury  In the setting of sepsis. Baseline creatinine 0.88, creatinine on admission 1.54 this AM (11/17). Cr. Trending up 2.46/ serum CO2 16/ CK 722 - ordered sodium bicarb infusion for 12 h 150 meq at 75 mL/h - continue IVF hydration @ 50 mL/h - Strict I/O's: alert provider if UOP < 0.5 mL/kg/hr - Daily CMP, replace electrolytes PRN - Avoid nephrotoxic agents as able, ensure adequate renal perfusion  Sepsis with suspected Septic Shock due to suspected Aspiration Pneumonia RESOLVED Lactic: 3.3 ~ 2.5 ~ 2.1~1.1 PCT: 0.12~0.35 Increased secretions, FiO2 and pressor requirements overnight- 06/20/20 - Unasyn ordered per pharmacy for aspiration coverage>> completed 12/3 - continue levophed drip, maintain MAP > 65 - continuous cardiac monitoring  Hypoalbuminia in the setting of ETOH abuse - continue trickle TF - continue reglan BID for suspected gastroparesis  Anemia chronic disease/acute critical illness No evidence of bleeding Monitor Transfuse if hemoglobin less than 7 H&H today 10/30  Best practice:  Diet: NPO Pain/Anxiety/Delirium protocol (if indicated): fentanyl & propofol & versed VAP protocol (if indicated): initiated DVT prophylaxis: enoxaparin  SQ GI prophylaxis: protonix Glucose control: monitor Q 4 Mobility: bedrest, mobilize as tolerated Code Status: FULL Family Communication: No family at bedside Disposition: ICU  Labs   CBC: Recent Labs  Lab 07/04/20 0530 07/05/20 0332 07/06/20 0322 07/07/20 0319 07/08/20 0748  WBC 8.8 7.9 8.0 7.9 7.1  NEUTROABS 6.1 5.2 5.1 4.8 3.9  HGB 8.9* 8.9* 9.1* 9.2* 10.0*  HCT 26.1* 26.8* 27.0* 27.9* 30.8*  MCV 104.4* 106.3* 105.1* 105.7* 108.5*  PLT 267 290 327 344 619    Basic Metabolic Panel: Recent Labs  Lab 07/03/20 0521 07/03/20 0521 07/04/20 0530 07/05/20 0332 07/06/20 0322 07/07/20 0319 07/08/20 0500  NA 146*   < > 143 143 144 143 145  K  3.9   < > 3.7 3.7 3.3* 3.2* 3.4*  CL 118*   < > 112* 113* 109 107 107  CO2 22   < > 21* 23 25 25 25   GLUCOSE 122*   < > 115* 120* 111* 115* 101*  BUN 47*   < > 43* 41* 34* 33* 37*  CREATININE 1.33*   < > 1.36* 1.26* 1.39* 1.33* 1.35*  CALCIUM 9.1   < > 9.2 8.9 9.3 9.0 9.0  MG 1.9   < > 2.0 2.1 2.0 2.0 2.0  PHOS 4.4  --  5.1* 5.5* 5.3*  --  5.5*   < > = values in this interval not displayed.   GFR: Estimated Creatinine Clearance: 111.1 mL/min (A) (by C-G formula based on SCr of 1.35 mg/dL (H)). Recent Labs  Lab 07/03/20 0521 07/03/20 0521 07/04/20 0530 07/04/20 0530 07/05/20 0332 07/06/20 0322 07/07/20 0319 07/08/20 0748  PROCALCITON <0.10  --  <0.10  --  <0.10  --   --   --   WBC 10.6*   < > 8.8   < > 7.9 8.0 7.9 7.1   < > = values in this interval not displayed.    Liver Function Tests: Recent Labs  Lab 07/02/20 0445 07/04/20 0530  ALBUMIN 2.2* 2.3*   No results for input(s): LIPASE, AMYLASE in the last 168 hours. Recent Labs  Lab 07/05/20 0332  AMMONIA 27    ABG    Component Value Date/Time   PHART 7.26 (L) 06/27/2020 0340   PCO2ART 41 06/27/2020 0340   PO2ART 78 (L) 06/27/2020 0340   HCO3 18.4 (L) 06/27/2020 0340   ACIDBASEDEF 7.9 (H) 06/26/2020 1140   O2SAT 93.3 06/27/2020 0340     Coagulation Profile: No results for input(s): INR, PROTIME in the last 168 hours.  Cardiac Enzymes: No results for input(s): CKTOTAL, CKMB, CKMBINDEX, TROPONINI in the last 168 hours.  HbA1C: Hgb A1c MFr Bld  Date/Time Value Ref Range Status  06/26/2020 06:21 PM 5.3 4.8 - 5.6 % Final    Comment:    (NOTE) Pre diabetes:          5.7%-6.4%  Diabetes:              >6.4%  Glycemic control for   <7.0% adults with diabetes     CBG: Recent Labs  Lab 07/07/20 2329 07/08/20 0406 07/08/20 0727 07/08/20 1124 07/08/20 1554  GLUCAP 111* 103* 86 100* 83    Critical care time: 40 minutes    Care coordination performed with bedside nurse.    Renold Don,  MD [place] [facility]   *This note was dictated using voice recognition software/Dragon.  Despite best efforts to proofread, errors can occur which can change the meaning.  Any change  was purely unintentional.

## 2020-07-09 ENCOUNTER — Inpatient Hospital Stay: Payer: Medicaid Other

## 2020-07-09 DIAGNOSIS — F10231 Alcohol dependence with withdrawal delirium: Secondary | ICD-10-CM | POA: Diagnosis not present

## 2020-07-09 DIAGNOSIS — G9341 Metabolic encephalopathy: Secondary | ICD-10-CM | POA: Diagnosis not present

## 2020-07-09 DIAGNOSIS — J9601 Acute respiratory failure with hypoxia: Secondary | ICD-10-CM | POA: Diagnosis not present

## 2020-07-09 LAB — CBC WITH DIFFERENTIAL/PLATELET
Abs Immature Granulocytes: 0.12 10*3/uL — ABNORMAL HIGH (ref 0.00–0.07)
Basophils Absolute: 0.1 10*3/uL (ref 0.0–0.1)
Basophils Relative: 1 %
Eosinophils Absolute: 0.8 10*3/uL — ABNORMAL HIGH (ref 0.0–0.5)
Eosinophils Relative: 9 %
HCT: 30 % — ABNORMAL LOW (ref 39.0–52.0)
Hemoglobin: 10 g/dL — ABNORMAL LOW (ref 13.0–17.0)
Immature Granulocytes: 1 %
Lymphocytes Relative: 26 %
Lymphs Abs: 2.3 10*3/uL (ref 0.7–4.0)
MCH: 35.1 pg — ABNORMAL HIGH (ref 26.0–34.0)
MCHC: 33.3 g/dL (ref 30.0–36.0)
MCV: 105.3 fL — ABNORMAL HIGH (ref 80.0–100.0)
Monocytes Absolute: 0.8 10*3/uL (ref 0.1–1.0)
Monocytes Relative: 9 %
Neutro Abs: 5 10*3/uL (ref 1.7–7.7)
Neutrophils Relative %: 54 %
Platelets: 361 10*3/uL (ref 150–400)
RBC: 2.85 MIL/uL — ABNORMAL LOW (ref 4.22–5.81)
RDW: 14.3 % (ref 11.5–15.5)
WBC: 9.1 10*3/uL (ref 4.0–10.5)
nRBC: 0 % (ref 0.0–0.2)

## 2020-07-09 LAB — GLUCOSE, CAPILLARY
Glucose-Capillary: 107 mg/dL — ABNORMAL HIGH (ref 70–99)
Glucose-Capillary: 115 mg/dL — ABNORMAL HIGH (ref 70–99)
Glucose-Capillary: 87 mg/dL (ref 70–99)
Glucose-Capillary: 91 mg/dL (ref 70–99)
Glucose-Capillary: 91 mg/dL (ref 70–99)
Glucose-Capillary: 94 mg/dL (ref 70–99)

## 2020-07-09 LAB — MAGNESIUM: Magnesium: 2.1 mg/dL (ref 1.7–2.4)

## 2020-07-09 LAB — BASIC METABOLIC PANEL
Anion gap: 9 (ref 5–15)
BUN: 34 mg/dL — ABNORMAL HIGH (ref 6–20)
CO2: 29 mmol/L (ref 22–32)
Calcium: 9.5 mg/dL (ref 8.9–10.3)
Chloride: 104 mmol/L (ref 98–111)
Creatinine, Ser: 1.32 mg/dL — ABNORMAL HIGH (ref 0.61–1.24)
GFR, Estimated: >60 mL/min (ref >60)
Glucose, Bld: 108 mg/dL — ABNORMAL HIGH (ref 70–99)
Potassium: 3.7 mmol/L (ref 3.5–5.1)
Sodium: 142 mmol/L (ref 135–145)

## 2020-07-09 LAB — PHOSPHORUS: Phosphorus: 4.8 mg/dL — ABNORMAL HIGH (ref 2.5–4.6)

## 2020-07-09 MED ORDER — POTASSIUM CHLORIDE 20 MEQ PO PACK
40.0000 meq | PACK | Freq: Once | ORAL | Status: AC
  Administered 2020-07-09: 40 meq
  Filled 2020-07-09: qty 2

## 2020-07-09 MED ORDER — SODIUM CHLORIDE 0.9% FLUSH: 10.0000 mL | INTRAVENOUS | Status: DC | PRN

## 2020-07-09 MED ORDER — SODIUM CHLORIDE 0.9% FLUSH
10.0000 mL | Freq: Two times a day (BID) | INTRAVENOUS | Status: DC
  Administered 2020-07-09 – 2020-07-12 (×6): 10 mL
  Administered 2020-07-13: 20 mL

## 2020-07-09 NOTE — Progress Notes (Signed)
Peripherally Inserted Central Catheter Placement  The IV Nurse has discussed with the patient and/or persons authorized to consent for the patient, the purpose of this procedure and the potential benefits and risks involved with this procedure.  The benefits include less needle sticks, lab draws from the catheter, and the patient may be discharged home with the catheter. Risks include, but not limited to, infection, bleeding, blood clot (thrombus formation), and puncture of an artery; nerve damage and irregular heartbeat and possibility to perform a PICC exchange if needed/ordered by physician.  Alternatives to this procedure were also discussed.  Bard Power PICC patient education guide, fact sheet on infection prevention and patient information card has been provided to patient /or left at bedside.  Mother gave telephone consent for PICC procedure.  PICC Placement Documentation  PICC Double Lumen 07/09/20 PICC Right Basilic 45 cm 2 cm (Active)  Indication for Insertion or Continuance of Line Vasoactive infusions 07/09/20 1406  Exposed Catheter (cm) 2 cm 07/09/20 1406  Site Assessment Clean;Dry;Intact 07/09/20 1406  Lumen #1 Status Flushed;Saline locked;Blood return noted 07/09/20 1406  Lumen #2 Status Flushed;Saline locked;Blood return noted 07/09/20 1406  Dressing Type Transparent 07/09/20 1406  Dressing Status Clean;Dry;Intact 07/09/20 1406  Antimicrobial disc in place? Yes 07/09/20 1406  Safety Lock Not Applicable 07/09/20 1406  Line Care Connections checked and tightened 07/09/20 1406  Line Adjustment (NICU/IV Team Only) No 07/09/20 1406  Dressing Intervention New dressing 07/09/20 1406  Dressing Change Due 07/16/20 07/09/20 1406       Elliot Dally 07/09/2020, 2:08 PM

## 2020-07-09 NOTE — Progress Notes (Addendum)
PHARMACY CONSULT NOTE  Pharmacy Consult for Electrolyte Monitoring and Replacement   Recent Labs: Potassium (mmol/L)  Date Value  07/09/2020 3.7  02/12/2013 3.9   Magnesium (mg/dL)  Date Value  75/88/3254 2.1   Calcium (mg/dL)  Date Value  98/26/4158 9.5   Calcium, Total (mg/dL)  Date Value  30/94/0768 8.7   Albumin (g/dL)  Date Value  08/81/1031 2.3 (L)  02/12/2013 3.6   Phosphorus (mg/dL)  Date Value  59/45/8592 4.8 (H)   Sodium (mmol/L)  Date Value  07/09/2020 142  02/12/2013 139   Assessment: 39 year old male here with encephalopathy s/t alcohol withdrawal. Patient intubated and sedated, requiring Dilaudid, midazolam and Precedex to maintain sedation.  Pharmacy to manage electrolytes.  Tube feeds: Vital AF 1.2 Cal at 20 mL/hr  + PROSource TF 90 mL 5 times daily per tube  Free water: 200 mL every 4 hours (d/c 12/2)  Diuretics: IV Lasix 40 mg daily (started 12/1) Bowel regimen: Lactulose 20 g BID (d/c 12/2) + IV Reglan   Goal of Therapy:  Electrolytes WNL  Plan:   Will give potassium 40 mEq per tube for one dose  Continue to follow  Pricilla Riffle, PharmD 07/09/2020 7:52 AM

## 2020-07-09 NOTE — Progress Notes (Signed)
Patient scheduled for tracheostomy tube placement at 11:30 on 07/10/2020.  Please hold anticoagulation prior as per protocol as well as tube feeds.  Previously have discussed procedure with patient's mom and all questions answered.

## 2020-07-09 NOTE — Progress Notes (Signed)
NAME:  Nicholas Valdez, MRN:  [id number], DOB:  [birth year], LOS: 27 ADMISSION DATE:  06/19/2020, INITIAL CONSULTATION DATE:  06/20/2020 REFERRING MD:  Dr. Cyndia Valdez, CHIEF COMPLAINT:  Altered Mental Status   Brief History   39 yo male significant history of ETOH and polysubstance abuse presented to the ED with altered mental status admitted to the ICU requiring precedex drip and ultimately intubation & mechanical ventilation.  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 Despite decreasing versed to 7 and completely stopping Propofol patient is only partially arousable. Will continue to wean sedation to RASS-0 with plan for SBT. 06/24/20- patient is being weaned off sedation as possible.  06/25/20-issues with hypoxia and asynchrony, patient has been intermittently febrile 11/25severe hypoxia, resp failure, failure to wean from ven 11/26-severe brain damage from ETOH abuse, severe DT' 11/27 failure to wean from ven 11/29- patient is difficult to wean with sedation, slight decrease in dilaudid patient tried to leave while intubated 11/30- plan to decrease sedation and repeat SBT with extubation if possible.  12/1- patient became severely aggitated, tachypneic, tachycardic and with heavy endotracheal secretions. He is febrile 12/2- Patient with extremely high tolerance to sedation have been unable to bring down dosing. Reviewed care plan with mother of patient Nicholas Valdez - plan for trache - ENT consult placed. 07/07/2020: Tracheostomy planned for 12/6, remains encephalopathic, does not follow commands, agitation when sedation is lightened. 07/08/2020: Moves all fours, still on vent, less agitated 07/09/2020: Tolerating brief periods of SBT, appears calmer but still with intermittent bursts of agitation   Procedures:  06/20/20  ETT >> 06/20/20 CVC 3 L >> PICC line today  Significant Diagnostic Tests:  06/19/20 CT head >> no acute intracranial abnormality 06/20/20 US abdomen >> mild hepatic steatosis 06/21/20 Echocardiogram >> LVEF 65-70%, no wall motion abnormalities, no other abnormalities noted Micro Data:  06/19/20 COVID-19 >> negative 06/19/20 Influenza PCR A/B >> negative 06/20/20 MRSA PCR >> negative 06/20/20 tracheal aspirate >> negative  Antimicrobials:  06/21/20 Unasyn >>12/3 07/05/2020 fluconazole>>  Scheduled Meds: . chlorhexidine gluconate (MEDLINE KIT)  15 mL Mouth Rinse BID  . Chlorhexidine Gluconate Cloth  6 each Topical Daily  . cloNIDine  0.2 mg Per Tube Q8H   Followed by  . cloNIDine  0.2 mg Per Tube Q12H   Followed by  . [START ON 07/11/2020] cloNIDine  0.2 mg Per Tube Daily  . cyanocobalamin  1,000 mcg Intramuscular Daily  . diazepam  5 mg Per Tube Q6H  . enoxaparin (LOVENOX) injection  0.5 mg/kg Subcutaneous Q24H  . feeding supplement (PROSource TF)  90 mL Per Tube 5 X Daily  . feeding supplement (VITAL AF 1.2 CAL)  1,000 mL Per Tube Q24H  . fluconazole  100 mg Per Tube QHS  . folic acid  1 mg Per Tube Daily  . furosemide  40 mg Intravenous Daily  . gabapentin  600 mg Per Tube Q8H  . insulin aspart  0-15 Units Subcutaneous Q4H  . mouth rinse  15 mL Mouth Rinse 10 times per day  . melatonin  5 mg Per Tube QHS  . multivitamin with minerals  1 tablet Per Tube Daily  . nystatin   Topical BID  . pantoprazole sodium  40 mg Per Tube Daily  . polyvinyl alcohol  1 drop Both Eyes BID  .  thiamine  100 mg Per Tube Daily  . valproic acid  500 mg Per Tube BID   Continuous Infusions: . HYDROmorphone 2 mg/hr (07/09/20 1000)  . midazolam 5 mg/hr (07/09/20 1000)   PRN Meds:.acetaminophen **OR** acetaminophen, docusate, HYDROmorphone, midazolam, polyethylene glycol   Objective   Blood pressure 98/61, pulse 66, temperature 98.1 F (36.7 C), temperature source Esophageal, resp. rate 20,  height $Remov'5\' 10"'OoRCqS$  (1.778 m), weight (!) 150.1 kg, SpO2 92 %.    Vent Mode: PSV;CPAP FiO2 (%):  [30 %-35 %] 30 % Set Rate:  [20 bmp] 20 bmp PEEP:  [5 cmH20] 5 cmH20 Pressure Support:  [5 cmH20] 5 cmH20 Plateau Pressure:  [15 cmH20] 15 cmH20   Intake/Output Summary (Last 24 hours) at 07/09/2020 1241 Last data filed at 07/09/2020 1000 Gross per 24 hour  Intake 1540.11 ml  Output 3800 ml  Net -2259.89 ml   Filed Weights   07/06/20 0112 07/07/20 0328 07/09/20 0407  Weight: (!) 159.2 kg (!) 150.1 kg (!) 150.1 kg    Examination: General: Adult male, critically ill, lying in bed intubated & sedated requiring mechanical ventilation  HEENT: MM pink/moist, anicteric, atraumatic, neck supple, no thrush Neuro: sedated, RASS -3, unable to follow commands, PERRL, moves all fours  CV: s1s2 RRR, NSR on monitor, no r/m/g Pulm: Regular, non labored on MV GI: soft, rounded, bs x 4 GU: foley in place with yellow urine Skin: scattered ecchymosis no rashes/lesions noted, spider angiomas noted Extremities: warm/dry, pulses + 2 R/P, LE edema +2  Assessment & Plan:  Acute Hypoxic Respiratory failure secondary to acute encephalopathy due to Alcohol withdrawal Suspected Aspiration Pneumonia Patient agitated due to ETOH withdrawal, obtunded with medication administration- unable to protect airway and intubated on 11/16. Concerns for aspiration PNA overnight due to copious secretions and increased FiO2 requirements, febrile 06/21/20 - Continue ventilator support - Wean PEEP & FiO2 as tolerated, maintain SpO2 > 90% - Head of bed elevated 30 degrees, VAP protocol in place - Intermittent chest x-ray & ABG PRN - Daily WUA with SBT as tolerated  - Ensure adequate pulmonary hygiene  - PAD protocol in place: continue Dilaudid drip & Versed drip - Added Valium via tube 12/3 to wean Versed off -Weaning Dilaudid, so far tolerating -Tracheostomy 12/6   Acute Encephalopathy due to ETOH withdrawal Hepatic  Steatosis Polysubstance abuse - CIWA protocol - continue thiamine & folic acid - continue Depakote, add Valium standing dose - Discontinued phenobarbital - daily CMP  Oral thrush    S/p eraxix >>diflucan x5d  Acute Kidney Injury  In the setting of sepsis. Baseline creatinine 0.88, creatinine on admission 1.54 this AM (11/17). Cr. Trending up 2.46/ serum CO2 16/ CK 722 - ordered sodium bicarb infusion for 12 h 150 meq at 75 mL/h - continue IVF hydration @ 50 mL/h - Strict I/O's: alert provider if UOP < 0.5 mL/kg/hr - Daily CMP, replace electrolytes PRN - Avoid nephrotoxic agents as able, ensure adequate renal perfusion  Sepsis with suspected Septic Shock due to suspected Aspiration Pneumonia RESOLVED Lactic: 3.3 ~ 2.5 ~ 2.1~1.1 PCT: 0.12~0.35 Increased secretions, FiO2 and pressor requirements overnight- 06/20/20 - Unasyn ordered per pharmacy for aspiration coverage>> completed 12/3 - continue levophed drip, maintain MAP > 65 - continuous cardiac monitoring  Hypoalbuminia in the setting of ETOH abuse - continue trickle TF - continue reglan BID for suspected gastroparesis  Anemia chronic disease/acute critical illness No evidence of bleeding Monitor Transfuse if hemoglobin less than 7 H&H today 10/30  Best  practice:  Diet: NPO Pain/Anxiety/Delirium protocol (if indicated): fentanyl & propofol & versed VAP protocol (if indicated): initiated DVT prophylaxis: enoxaparin SQ GI prophylaxis: protonix Glucose control: monitor Q 4 Mobility: bedrest, mobilize as tolerated Code Status: FULL Family Communication: No family at bedside Disposition: ICU  Labs   CBC: Recent Labs  Lab 07/05/20 0332 07/06/20 0322 07/07/20 0319 07/08/20 0748 07/09/20 0625  WBC 7.9 8.0 7.9 7.1 9.1  NEUTROABS 5.2 5.1 4.8 3.9 5.0  HGB 8.9* 9.1* 9.2* 10.0* 10.0*  HCT 26.8* 27.0* 27.9* 30.8* 30.0*  MCV 106.3* 105.1* 105.7* 108.5* 105.3*  PLT 290 327 344 376 235    Basic Metabolic  Panel: Recent Labs  Lab 07/04/20 0530 07/04/20 0530 07/05/20 0332 07/06/20 0322 07/07/20 0319 07/08/20 0500 07/09/20 0625  NA 143   < > 143 144 143 145 142  K 3.7   < > 3.7 3.3* 3.2* 3.4* 3.7  CL 112*   < > 113* 109 107 107 104  CO2 21*   < > $R'23 25 25 25 29  'Co$ GLUCOSE 115*   < > 120* 111* 115* 101* 108*  BUN 43*   < > 41* 34* 33* 37* 34*  CREATININE 1.36*   < > 1.26* 1.39* 1.33* 1.35* 1.32*  CALCIUM 9.2   < > 8.9 9.3 9.0 9.0 9.5  MG 2.0   < > 2.1 2.0 2.0 2.0 2.1  PHOS 5.1*  --  5.5* 5.3*  --  5.5* 4.8*   < > = values in this interval not displayed.   GFR: Estimated Creatinine Clearance: 113.6 mL/min (A) (by C-G formula based on SCr of 1.32 mg/dL (H)). Recent Labs  Lab 07/03/20 0521 07/03/20 0521 07/04/20 0530 07/04/20 0530 07/05/20 0332 07/05/20 0332 07/06/20 0322 07/07/20 0319 07/08/20 0748 07/09/20 0625  PROCALCITON <0.10  --  <0.10  --  <0.10  --   --   --   --   --   WBC 10.6*   < > 8.8   < > 7.9   < > 8.0 7.9 7.1 9.1   < > = values in this interval not displayed.    Liver Function Tests: Recent Labs  Lab 07/04/20 0530  ALBUMIN 2.3*   No results for input(s): LIPASE, AMYLASE in the last 168 hours. Recent Labs  Lab 07/05/20 0332  AMMONIA 27    ABG    Component Value Date/Time   PHART 7.26 (L) 06/27/2020 0340   PCO2ART 41 06/27/2020 0340   PO2ART 78 (L) 06/27/2020 0340   HCO3 18.4 (L) 06/27/2020 0340   ACIDBASEDEF 7.9 (H) 06/26/2020 1140   O2SAT 93.3 06/27/2020 0340     Coagulation Profile: No results for input(s): INR, PROTIME in the last 168 hours.  Cardiac Enzymes: No results for input(s): CKTOTAL, CKMB, CKMBINDEX, TROPONINI in the last 168 hours.  HbA1C: Hgb A1c MFr Bld  Date/Time Value Ref Range Status  06/26/2020 06:21 PM 5.3 4.8 - 5.6 % Final    Comment:    (NOTE) Pre diabetes:          5.7%-6.4%  Diabetes:              >6.4%  Glycemic control for   <7.0% adults with diabetes     CBG: Recent Labs  Lab 07/08/20 2134  07/08/20 2347 07/09/20 0400 07/09/20 0733 07/09/20 1111  GLUCAP 92 94 91 91 107*    Critical care time: 35 minutes    Care coordination performed with bedside nurse.  Renold Don, MD [place] [facility]   *This note was dictated using voice recognition software/Dragon.  Despite best efforts to proofread, errors can occur which can change the meaning.  Any change was purely unintentional.

## 2020-07-09 NOTE — Progress Notes (Signed)
Spoke with Dr Jayme Cloud via secure chat 07/08/20 at 1852.  States okay to proceed with PICC placement with febrile episodes.

## 2020-07-10 ENCOUNTER — Encounter
Admission: EM | Disposition: A | Discharge status: Rehabilitation Facility | Payer: Self-pay | Source: Home / Self Care | Attending: Internal Medicine

## 2020-07-10 ENCOUNTER — Inpatient Hospital Stay: Payer: Medicaid Other | Admitting: Registered Nurse

## 2020-07-10 ENCOUNTER — Encounter: Payer: Self-pay | Admitting: Internal Medicine

## 2020-07-10 ENCOUNTER — Inpatient Hospital Stay: Payer: Medicaid Other

## 2020-07-10 DIAGNOSIS — F10231 Alcohol dependence with withdrawal delirium: Secondary | ICD-10-CM | POA: Diagnosis not present

## 2020-07-10 DIAGNOSIS — J9601 Acute respiratory failure with hypoxia: Secondary | ICD-10-CM | POA: Diagnosis not present

## 2020-07-10 DIAGNOSIS — G9341 Metabolic encephalopathy: Secondary | ICD-10-CM | POA: Diagnosis not present

## 2020-07-10 HISTORY — PX: TRACHEOSTOMY TUBE PLACEMENT: SHX814

## 2020-07-10 LAB — CBC WITH DIFFERENTIAL/PLATELET
Abs Immature Granulocytes: 0.11 10*3/uL — ABNORMAL HIGH (ref 0.00–0.07)
Basophils Absolute: 0.1 10*3/uL (ref 0.0–0.1)
Basophils Relative: 0 %
Eosinophils Absolute: 1 10*3/uL — ABNORMAL HIGH (ref 0.0–0.5)
Eosinophils Relative: 5 %
HCT: 29.7 % — ABNORMAL LOW (ref 39.0–52.0)
Hemoglobin: 10.1 g/dL — ABNORMAL LOW (ref 13.0–17.0)
Immature Granulocytes: 1 %
Lymphocytes Relative: 8 %
Lymphs Abs: 1.4 10*3/uL (ref 0.7–4.0)
MCH: 35.7 pg — ABNORMAL HIGH (ref 26.0–34.0)
MCHC: 34 g/dL (ref 30.0–36.0)
MCV: 104.9 fL — ABNORMAL HIGH (ref 80.0–100.0)
Monocytes Absolute: 1.1 10*3/uL — ABNORMAL HIGH (ref 0.1–1.0)
Monocytes Relative: 6 %
Neutro Abs: 15 10*3/uL — ABNORMAL HIGH (ref 1.7–7.7)
Neutrophils Relative %: 80 %
Platelets: 343 10*3/uL (ref 150–400)
RBC: 2.83 MIL/uL — ABNORMAL LOW (ref 4.22–5.81)
RDW: 14.2 % (ref 11.5–15.5)
WBC: 18.6 10*3/uL — ABNORMAL HIGH (ref 4.0–10.5)
nRBC: 0 % (ref 0.0–0.2)

## 2020-07-10 LAB — VALPROIC ACID LEVEL: Valproic Acid Lvl: 17 ug/mL — ABNORMAL LOW (ref 50.0–100.0)

## 2020-07-10 LAB — GLUCOSE, CAPILLARY
Glucose-Capillary: 97 mg/dL (ref 70–99)
Glucose-Capillary: 107 mg/dL — ABNORMAL HIGH (ref 70–99)
Glucose-Capillary: 115 mg/dL — ABNORMAL HIGH (ref 70–99)
Glucose-Capillary: 132 mg/dL — ABNORMAL HIGH (ref 70–99)
Glucose-Capillary: 113 mg/dL — ABNORMAL HIGH (ref 70–99)

## 2020-07-10 LAB — PROCALCITONIN: Procalcitonin: 0.14 ng/mL

## 2020-07-10 LAB — POTASSIUM: Potassium: 3.7 mmol/L (ref 3.5–5.1)

## 2020-07-10 LAB — MAGNESIUM: Magnesium: 2 mg/dL (ref 1.7–2.4)

## 2020-07-10 LAB — MRSA PCR SCREENING: MRSA by PCR: NEGATIVE

## 2020-07-10 SURGERY — CREATION, TRACHEOSTOMY
Anesthesia: General

## 2020-07-10 MED ORDER — PROPOFOL 500 MG/50ML IV EMUL
INTRAVENOUS | Status: AC
  Filled 2020-07-10: qty 50

## 2020-07-10 MED ORDER — EPHEDRINE 5 MG/ML INJ
INTRAVENOUS | Status: AC
  Filled 2020-07-10: qty 10

## 2020-07-10 MED ORDER — FENTANYL CITRATE (PF) 100 MCG/2ML IJ SOLN
INTRAMUSCULAR | Status: AC
  Filled 2020-07-10: qty 2

## 2020-07-10 MED ORDER — PHENYLEPHRINE HCL (PRESSORS) 10 MG/ML IV SOLN
INTRAVENOUS | Status: DC | PRN
  Administered 2020-07-10 (×3): 100 ug via INTRAVENOUS

## 2020-07-10 MED ORDER — DEXMEDETOMIDINE (PRECEDEX) IN NS 20 MCG/5ML (4 MCG/ML) IV SYRINGE
PREFILLED_SYRINGE | INTRAVENOUS | Status: DC | PRN
  Administered 2020-07-10: 20 ug via INTRAVENOUS

## 2020-07-10 MED ORDER — DEXMEDETOMIDINE (PRECEDEX) IN NS 20 MCG/5ML (4 MCG/ML) IV SYRINGE
PREFILLED_SYRINGE | INTRAVENOUS | Status: AC
  Filled 2020-07-10: qty 5

## 2020-07-10 MED ORDER — ROCURONIUM BROMIDE 10 MG/ML (PF) SYRINGE
PREFILLED_SYRINGE | INTRAVENOUS | Status: AC
  Filled 2020-07-10: qty 10

## 2020-07-10 MED ORDER — ROCURONIUM BROMIDE 100 MG/10ML IV SOLN
INTRAVENOUS | Status: DC | PRN
  Administered 2020-07-10: 50 mg via INTRAVENOUS
  Administered 2020-07-10: 30 mg via INTRAVENOUS

## 2020-07-10 MED ORDER — POTASSIUM CHLORIDE 20 MEQ PO PACK
40.0000 meq | PACK | Freq: Every day | ORAL | Status: DC
  Administered 2020-07-10 – 2020-07-13 (×4): 40 meq
  Filled 2020-07-10 (×4): qty 2

## 2020-07-10 MED ORDER — MIDAZOLAM HCL 2 MG/2ML IJ SOLN
INTRAMUSCULAR | Status: AC
  Filled 2020-07-10: qty 4

## 2020-07-10 MED ORDER — FENTANYL CITRATE (PF) 100 MCG/2ML IJ SOLN
INTRAMUSCULAR | Status: DC | PRN
  Administered 2020-07-10: 100 ug via INTRAVENOUS

## 2020-07-10 MED ORDER — MIDAZOLAM HCL 2 MG/2ML IJ SOLN
INTRAMUSCULAR | Status: DC | PRN
  Administered 2020-07-10: 4 mg via INTRAVENOUS

## 2020-07-10 MED ORDER — PROPOFOL 500 MG/50ML IV EMUL
INTRAVENOUS | Status: DC | PRN
  Administered 2020-07-10: 150 ug/kg/min via INTRAVENOUS

## 2020-07-10 MED ORDER — LACTATED RINGERS IV SOLN: INTRAVENOUS | Status: DC | PRN

## 2020-07-10 SURGICAL SUPPLY — 29 items
BLADE SURG 15 STRL LF DISP TIS (BLADE) ×1 IMPLANT
BLADE SURG 15 STRL SS (BLADE) ×2
BLADE SURG SZ11 CARB STEEL (BLADE) ×2 IMPLANT
CANISTER SUCT 1200ML W/VALVE (MISCELLANEOUS) ×2 IMPLANT
COVER WAND RF STERILE (DRAPES) ×2 IMPLANT
ELECT REM PT RETURN 9FT ADLT (ELECTROSURGICAL) ×2
ELECTRODE REM PT RTRN 9FT ADLT (ELECTROSURGICAL) ×1 IMPLANT
GLOVE BIO SURGEON STRL SZ7.5 (GLOVE) ×2 IMPLANT
GOWN STRL REUS W/ TWL LRG LVL3 (GOWN DISPOSABLE) ×2 IMPLANT
GOWN STRL REUS W/TWL LRG LVL3 (GOWN DISPOSABLE) ×4
HEMOSTAT SURGICEL 2X3 (HEMOSTASIS) IMPLANT
HLDR TRACH TUBE NECKBAND 18 (MISCELLANEOUS) ×1 IMPLANT
HOLDER TRACH TUBE NECKBAND 18 (MISCELLANEOUS) ×2
KIT TURNOVER KIT A (KITS) ×2 IMPLANT
LABEL OR SOLS (LABEL) ×2 IMPLANT
MANIFOLD NEPTUNE II (INSTRUMENTS) ×2 IMPLANT
NS IRRIG 500ML POUR BTL (IV SOLUTION) ×2 IMPLANT
PACK HEAD/NECK (MISCELLANEOUS) ×2 IMPLANT
SHEARS HARMONIC 9CM CVD (BLADE) ×2 IMPLANT
SPONGE DRAIN TRACH 4X4 STRL 2S (GAUZE/BANDAGES/DRESSINGS) ×2 IMPLANT
SPONGE KITTNER 5P (MISCELLANEOUS) ×2 IMPLANT
SUCTION FRAZIER HANDLE 10FR (MISCELLANEOUS) ×2
SUCTION TUBE FRAZIER 10FR DISP (MISCELLANEOUS) ×1 IMPLANT
SUT ETHILON 2 0 FS 18 (SUTURE) ×2 IMPLANT
SUT SILK 2 0 (SUTURE)
SUT SILK 2-0 18XBRD TIE 12 (SUTURE) IMPLANT
SUT VIC AB 3-0 PS2 18 (SUTURE) ×2 IMPLANT
TUBE TRACH SHILEY  6 DIST  CUF (TUBING) IMPLANT
TUBE TRACH SHILEY 8 DIST CUF (TUBING) ×1 IMPLANT

## 2020-07-10 NOTE — Op Note (Signed)
..  07/10/2020 12:38 PM  Nicholas Valdez [id number]  Pre-Op Dx: Respiratory failure, alcoholic enceophalopathy  Post-Op Dx:  same  Proc:  Tracheostomy  Surg:  Bud Face   Assist:  Judie Petit:  GOT  EBL:  <89ml  Comp:  none  Findings:   Size 8 regular cuffed shiley placed without difficulty.  Procedure:  The patient was brought from the intensive care unit to the operating room and transferred to an operating table.  Anesthesia was administered per indwelling orotracheal tube.   Neck extension was achieved as possible anda shoulder rolke was placed.  The lower neck was palpated with the findings as described above.  1% Xylocaine with 1:100,000 epinephrine, 5 cc's, was infiltrated into the surgical field for intraoperative hemostasis.  Several minutes were allowed for this to take effect. The patient was prepped in a sterile fashion with a surgical prep from the chin down to the upper chest.  Sterile draping was accomplished in the standard fashion.  A  5 cm horizontal incision was made sharply a finger's breadth above the sternal notch, and extended through skin and subcutaneous fat.  Using cautery, the superficial layer of the deep cervical fascia was lysed.  Additional dissection revealed the strap muscles.  The midline raphe was divided in two layers and the muscles retracted laterally.  The pretracheal plane was visualized.  This was entered bluntly.  The thyroid isthmus was isolated and divided with the Harmonic scalpel.  The thyroid gland was retracted to either side.  The anterior face of the trachea was cleared.  In the  2nd-3rd interspace, a transverse incision was made between cartilage rings into the tracheal lumen.  A 6 mm wide inferiorly based flap was generated and secured to the lower wound with a 4-0 chromic suture.   A previously tested  # 8 Shiley cuffed tracheostomy tube was brought into the field.  With the endotracheal tube under direct visualization through the  tracheostomy, it was gently backed up.  The tracheostomy tube was inserted into the tracheal lumen.  Hemostasis was observed. The cuff was inflated and observed to be intact and containing pressure. The inner cannula was placed and ventilation assumed per tracheostomy tube.  Good chest wall motion was observed, and CO2 was documented per anesthesia.  The trach tube was secured in the standard fashion with trach ties. A 2-0 Nylon suture was used to secure the trach tube to the skin on both sides.  Hemostasis was observed again.  When satisfactory ventilation was assured, the orotracheal tube was removed.  At this point the procedure was completed.  The patient was returned to anesthesia, awakened as possible, and transferred back to the intensive care unit in stable condition.  Comment: 39 y.o. male with prolonged ventilation was the indication for today's procedure.  Anticipate a routine postoperative recovery including standard tracheal hygiene.  The sutures should be removed in 5 days.  When the patient no longer requires ventilator or pressure support, the cuff should be deflated.  Changing to an uncuffed tube and downsizing will be according to the clinical condition of the patient.   Nicholas Valdez  12:38 PM 07/10/2020

## 2020-07-10 NOTE — Progress Notes (Signed)
NAME:  Nicholas Valdez, MRN:  [id number], DOB:  [birth year], LOS: 21 ADMISSION DATE:  06/19/2020, INITIAL CONSULTATION DATE:  06/20/2020 REFERRING MD:  Dr. Cyndia Skeeters, CHIEF COMPLAINT:  Altered Mental Status   Brief History   39 yo male significant history of ETOH and polysubstance abuse presented to the ED with altered mental status admitted to the ICU requiring precedex drip and ultimately intubation & mechanical ventilation.  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 Despite decreasing versed to 7 and completely stopping Propofol patient is only partially arousable. Will continue to wean sedation to RASS-0 with plan for SBT. 06/24/20- patient is being weaned off sedation as possible.  06/25/20-issues with hypoxia and asynchrony, patient has been intermittently febrile 11/25severe hypoxia, resp failure, failure to wean from ven 11/26-severe brain damage from ETOH abuse, severe DT' 11/27 failure to wean from ven 11/29- patient is difficult to wean with sedation, slight decrease in dilaudid patient tried to leave while intubated 11/30- plan to decrease sedation and repeat SBT with extubation if possible.  12/1- patient became severely aggitated, tachypneic, tachycardic and with heavy endotracheal secretions. He is febrile 12/2- Patient with extremely high tolerance to sedation have been unable to bring down dosing. Reviewed care plan with mother of patient Nicholas Valdez - plan for trache - ENT consult placed. 07/07/2020: Tracheostomy planned for 12/6, remains encephalopathic, does not follow commands, agitation when sedation is lightened. 07/08/2020: Moves all fours, still on vent, less agitated   Procedures:  06/20/20 ETT >> 06/20/20 CVC 3 L >> will need PICC line  Significant Diagnostic Tests:  06/19/20 CT head >> no  acute intracranial abnormality 06/20/20 US abdomen >> mild hepatic steatosis 06/21/20 Echocardiogram >> LVEF 65-70%, no wall motion abnormalities, no other abnormalities noted Micro Data:  06/19/20 COVID-19 >> negative 06/19/20 Influenza PCR A/B >> negative 06/20/20 MRSA PCR >> negative 06/20/20 tracheal aspirate >> negative  Antimicrobials:  06/21/20 Unasyn >>12/3 07/05/2020 fluconazole>>   CC  Follow up resp failure  HPI Remains on vent Encephalopathy Failure to wean from vent    Vent Mode: PCV FiO2 (%):  [30 %] 30 % Set Rate:  [20 bmp] 20 bmp PEEP:  [5 cmH20] 5 cmH20 Pressure Support:  [5 cmH20] 5 cmH20 Plateau Pressure:  [17 cmH20] 17 cmH20  CBC    Component Value Date/Time   WBC 18.6 (H) 07/10/2020 0627   RBC 2.83 (L) 07/10/2020 0627   HGB 10.1 (L) 07/10/2020 0627   HGB 16.1 02/12/2013 2028   HCT 29.7 (L) 07/10/2020 0627   HCT 47.2 02/12/2013 2028   PLT 343 07/10/2020 0627   PLT 278 02/12/2013 2028   MCV 104.9 (H) 07/10/2020 0627   MCV 95 02/12/2013 2028   MCH 35.7 (H) 07/10/2020 0627   MCHC 34.0 07/10/2020 0627   RDW 14.2 07/10/2020 0627   RDW 13.4 02/12/2013 2028   LYMPHSABS 1.4 07/10/2020 0627   MONOABS 1.1 (H) 07/10/2020 0627   EOSABS 1.0 (H) 07/10/2020 0627   BASOSABS 0.1 07/10/2020 0627   BMP Latest Ref Rng & Units 07/09/2020 07/08/2020 07/07/2020  Glucose 70 - 99 mg/dL 108(H) 101(H) 115(H)  BUN 6 - 20 mg/dL 34(H) 37(H) 33(H)  Creatinine 0.61 - 1.24 mg/dL 1.32(H) 1.35(H) 1.33(H)  Sodium 135 - 145 mmol/L 142 145 143  Potassium 3.5 - 5.1 mmol/L 3.7  3.4(L) 3.2(L)  Chloride 98 - 111 mmol/L 104 107 107  CO2 22 - 32 mmol/L 29 25 25   Calcium 8.9 - 10.3 mg/dL 9.5 9.0 9.0     REVIEW OF SYSTEMS  PATIENT IS UNABLE TO PROVIDE COMPLETE REVIEW OF SYSTEM S DUE TO SEVERE CRITICAL ILLNESS AND ENCEPHALOPATHY   PHYSICAL EXAMINATION:  GENERAL:critically ill appearing, +resp distress HEAD: Normocephalic, atraumatic.  EYES: Pupils equal, round, reactive to  light.  No scleral icterus.  MOUTH: Moist mucosal membrane. NECK: Supple. No thyromegaly. No nodules. No JVD.  PULMONARY: +rhonchi, +wheezing CARDIOVASCULAR: S1 and S2. Regular rate and rhythm. No murmurs, rubs, or gallops.  GASTROINTESTINAL: Soft, nontender, -distended. Positive bowel sounds.  MUSCULOSKELETAL: No swelling, clubbing, or edema.  NEUROLOGIC: obtunded SKIN:intact,warm,dry        Scheduled Meds: . chlorhexidine gluconate (MEDLINE KIT)  15 mL Mouth Rinse BID  . Chlorhexidine Gluconate Cloth  6 each Topical Daily  . cloNIDine  0.2 mg Per Tube Q12H   Followed by  . [START ON 07/11/2020] cloNIDine  0.2 mg Per Tube Daily  . cyanocobalamin  1,000 mcg Intramuscular Daily  . diazepam  5 mg Per Tube Q6H  . enoxaparin (LOVENOX) injection  0.5 mg/kg Subcutaneous Q24H  . feeding supplement (PROSource TF)  90 mL Per Tube 5 X Daily  . feeding supplement (VITAL AF 1.2 CAL)  1,000 mL Per Tube Q24H  . fluconazole  100 mg Per Tube QHS  . folic acid  1 mg Per Tube Daily  . furosemide  40 mg Intravenous Daily  . gabapentin  600 mg Per Tube Q8H  . insulin aspart  0-15 Units Subcutaneous Q4H  . mouth rinse  15 mL Mouth Rinse 10 times per day  . melatonin  5 mg Per Tube QHS  . multivitamin with minerals  1 tablet Per Tube Daily  . nystatin   Topical BID  . pantoprazole sodium  40 mg Per Tube Daily  . polyvinyl alcohol  1 drop Both Eyes BID  . sodium chloride flush  10-40 mL Intracatheter Q12H  . thiamine  100 mg Per Tube Daily  . valproic acid  500 mg Per Tube BID   Continuous Infusions: . HYDROmorphone 4 mg/hr (07/10/20 0255)  . midazolam 10 mg/hr (07/10/20 0417)   PRN Meds:.acetaminophen **OR** acetaminophen, docusate, HYDROmorphone, midazolam, polyethylene glycol, sodium chloride flush   Objective   Blood pressure 125/70, pulse (!) 107, temperature 98.4 F (36.9 C), resp. rate (!) 23, height 5' 10"  (1.778 m), weight (!) 150.1 kg, SpO2 95 %.      Intake/Output Summary  (Last 24 hours) at 07/10/2020 0906 Last data filed at 07/10/2020 0600 Gross per 24 hour  Intake 817.12 ml  Output 3150 ml  Net -2332.88 ml   Filed Weights   07/06/20 0112 07/07/20 0328 07/09/20 0407  Weight: (!) 159.2 kg (!) 150.1 kg (!) 150.1 kg      Assessment & Plan:  Acute Hypoxic Respiratory failure secondary to acute encephalopathy due to Alcohol withdrawal Suspected Aspiration Pneumonia Patient agitated due to ETOH withdrawal, obtunded with medication administration- unable to protect airway and intubated on 11/16. Concerns for aspiration PNA overnight due to copious secretions and increased FiO2 requirements, febrile 06/21/20   Severe ACUTE Hypoxic and Hypercapnic Respiratory Failure -continue Mechanical Ventilator support -continue Bronchodilator Therapy -Wean Fio2 and PEEP as tolerated -VAP/VENT bundle implementation Plan for trach    NEUROLOGY Acute toxic metabolic encephalopathy, need for sedation Goal RASS -2 to -3 Acute Encephalopathy due to  ETOH withdrawal Hepatic Steatosis Polysubstance abuse  Oral thrush    S/p eraxix >>diflucan x5d  ACUTE KIDNEY INJURY/Renal Failure -continue Foley Catheter-assess need -Avoid nephrotoxic agents -Follow urine output, BMP -Ensure adequate renal perfusion, optimize oxygenation -Renal dose medications   Sepsis with suspected Septic Shock due to suspected Aspiration Pneumonia RESOLVED  Hypoalbuminia in the setting of ETOH abuse - continue trickle TF - continue reglan BID for suspected gastroparesis   GI GI PROPHYLAXIS as indicated  NUTRITIONAL STATUS DIET-->TF's as tolerated Constipation protocol as indicated   ENDO - ICU hypoglycemic\Hyperglycemia protocol -check FSBS per protocol   DVT/GI PRX ordered and assessed TRANSFUSIONS AS NEEDED MONITOR FSBS I Assessed the need for Labs I Assessed the need for Foley I Assessed the need for Central Venous Line Family Discussion when available I Assessed the  need for Mobilization I made an Assessment of medications to be adjusted accordingly Safety Risk assessment completed  CASE DISCUSSED IN MULTIDISCIPLINARY ROUNDS WITH ICU TEAM   Critical Care Time devoted to patient care services described in this note is 45  minutes.   Overall, patient is critically ill, prognosis is guarded.     Corrin Parker, M.D.  [facility]  Medical Director [place] Director [facility]

## 2020-07-10 NOTE — Anesthesia Preprocedure Evaluation (Signed)
Anesthesia Evaluation  Patient identified by MRN, date of birth, ID band Patient confused    Reviewed: Allergy & Precautions, H&P , NPO status , Patient's Chart, lab work & pertinent test results  History of Anesthesia Complications Negative for: history of anesthetic complications  Airway Mallampati: Intubated  TM Distance: >3 FB Neck ROM: limited    Dental  (+) Chipped   Pulmonary neg shortness of breath, pneumonia, unresolved, Current Smoker,    Pulmonary exam normal        Cardiovascular negative cardio ROS Normal cardiovascular exam     Neuro/Psych PSYCHIATRIC DISORDERS negative neurological ROS  negative psych ROS   GI/Hepatic negative GI ROS, (+) Hepatitis -, C  Endo/Other  negative endocrine ROS  Renal/GU      Musculoskeletal   Abdominal   Peds  Hematology negative hematology ROS (+)   Anesthesia Other Findings Past Medical History: No date: Alcohol abuse No date: Drug abuse (HCC) No date: Hepatitis C  History reviewed. No pertinent surgical history.  BMI    Body Mass Index: 47.48 kg/m      Reproductive/Obstetrics negative OB ROS                             Anesthesia Physical Anesthesia Plan  ASA: IV  Anesthesia Plan: General ETT   Post-op Pain Management:    Induction: Intravenous  PONV Risk Score and Plan: Ondansetron, Dexamethasone, Midazolam and Treatment may vary due to age or medical condition  Airway Management Planned: Oral ETT  Additional Equipment:   Intra-op Plan:   Post-operative Plan: Extubation in OR  Informed Consent: I have reviewed the patients History and Physical, chart, labs and discussed the procedure including the risks, benefits and alternatives for the proposed anesthesia with the patient or authorized representative who has indicated his/her understanding and acceptance.     Dental Advisory Given  Plan Discussed with:  Anesthesiologist, CRNA and Surgeon  Anesthesia Plan Comments: (History and phone consent from the patients mother Remiel Corti at [phone]   Mother consented for risks of anesthesia including but not limited to:  - adverse reactions to medications - damage to eyes, teeth, lips or other oral mucosa - nerve damage due to positioning  - sore throat or hoarseness - Damage to heart, brain, nerves, lungs, other parts of body or loss of life  She voiced understanding.)        Anesthesia Quick Evaluation

## 2020-07-10 NOTE — Transfer of Care (Signed)
Immediate Anesthesia Transfer of Care Note  Patient: Nicholas Valdez  Procedure(s) Performed: TRACHEOSTOMY (N/A )  Patient Location: ICU  Anesthesia Type:General  Level of Consciousness: sedated and Patient remains intubated per anesthesia plan  Airway & Oxygen Therapy: Patient remains intubated per anesthesia plan and Patient placed on Ventilator (see vital sign flow sheet for setting)  Post-op Assessment: Report given to RN and Post -op Vital signs reviewed and stable  Post vital signs: Reviewed  Last Vitals:  Vitals Value Taken Time  BP    Temp    Pulse 81 07/10/20 1240  Resp 9 07/10/20 1240  SpO2 99 % 07/10/20 1240  Vitals shown include unvalidated device data.  Last Pain:  Vitals:   07/10/20 1047  TempSrc: Oral  PainSc:       Patients Stated Pain Goal: 0 (07/09/20 0300)  Complications: No complications documented.

## 2020-07-10 NOTE — Progress Notes (Addendum)
PHARMACY CONSULT NOTE  Pharmacy Consult for Electrolyte Monitoring and Replacement   Recent Labs: Potassium (mmol/L)  Date Value  07/09/2020 3.7  02/12/2013 3.9   Magnesium (mg/dL)  Date Value  60/05/9322 2.0   Calcium (mg/dL)  Date Value  55/73/2202 9.5   Calcium, Total (mg/dL)  Date Value  54/27/0623 8.7   Albumin (g/dL)  Date Value  76/28/3151 2.3 (L)  02/12/2013 3.6   Phosphorus (mg/dL)  Date Value  76/16/0737 4.8 (H)   Sodium (mmol/L)  Date Value  07/09/2020 142  02/12/2013 139   Assessment: 39 year old male here with encephalopathy s/t alcohol withdrawal. Patient has been intubated and sedated since 11/16. 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 Pharmacy to manage electrolytes.  Tube feeds: Vital AF 1.2 Cal at 20 mL/hr  + PROSource TF 90 mL 5 times daily per tube  Free water: 200 mL every 4 hours (d/c 12/2)  Diuretics: IV Lasix 40 mg daily (started 12/1)  Bowel regimen: Lactulose 20 g BID (d/c 12/2)   Goal of Therapy:  Electrolytes WNL  Plan:   K 3.7, un-changed after replacement with KCl 40 mEq x 1 yesterday. Will order KCl 40 mEq per tube daily. Give when Lasix given, hold when Lasix held  Follow-up with AM labs tomorrow  Tressie Ellis 07/10/2020 8:17 AM

## 2020-07-11 ENCOUNTER — Inpatient Hospital Stay: Payer: Medicaid Other

## 2020-07-11 DIAGNOSIS — G9341 Metabolic encephalopathy: Secondary | ICD-10-CM | POA: Diagnosis not present

## 2020-07-11 DIAGNOSIS — F10231 Alcohol dependence with withdrawal delirium: Secondary | ICD-10-CM | POA: Diagnosis not present

## 2020-07-11 DIAGNOSIS — J9601 Acute respiratory failure with hypoxia: Secondary | ICD-10-CM | POA: Diagnosis not present

## 2020-07-11 DIAGNOSIS — R7989 Other specified abnormal findings of blood chemistry: Secondary | ICD-10-CM | POA: Diagnosis not present

## 2020-07-11 LAB — CBC WITH DIFFERENTIAL/PLATELET
Abs Immature Granulocytes: 0.13 10*3/uL — ABNORMAL HIGH (ref 0.00–0.07)
Basophils Absolute: 0.1 10*3/uL (ref 0.0–0.1)
Basophils Relative: 0 %
Eosinophils Absolute: 0.7 10*3/uL — ABNORMAL HIGH (ref 0.0–0.5)
Eosinophils Relative: 3 %
HCT: 28.1 % — ABNORMAL LOW (ref 39.0–52.0)
Hemoglobin: 9.4 g/dL — ABNORMAL LOW (ref 13.0–17.0)
Immature Granulocytes: 1 %
Lymphocytes Relative: 8 %
Lymphs Abs: 1.8 10*3/uL (ref 0.7–4.0)
MCH: 35.5 pg — ABNORMAL HIGH (ref 26.0–34.0)
MCHC: 33.5 g/dL (ref 30.0–36.0)
MCV: 106 fL — ABNORMAL HIGH (ref 80.0–100.0)
Monocytes Absolute: 1.7 10*3/uL — ABNORMAL HIGH (ref 0.1–1.0)
Monocytes Relative: 8 %
Neutro Abs: 18.1 10*3/uL — ABNORMAL HIGH (ref 1.7–7.7)
Neutrophils Relative %: 80 %
Platelets: 330 10*3/uL (ref 150–400)
RBC: 2.65 MIL/uL — ABNORMAL LOW (ref 4.22–5.81)
RDW: 14.3 % (ref 11.5–15.5)
WBC: 22.6 10*3/uL — ABNORMAL HIGH (ref 4.0–10.5)
nRBC: 0 % (ref 0.0–0.2)

## 2020-07-11 LAB — GLUCOSE, CAPILLARY
Glucose-Capillary: 106 mg/dL — ABNORMAL HIGH (ref 70–99)
Glucose-Capillary: 113 mg/dL — ABNORMAL HIGH (ref 70–99)
Glucose-Capillary: 125 mg/dL — ABNORMAL HIGH (ref 70–99)
Glucose-Capillary: 127 mg/dL — ABNORMAL HIGH (ref 70–99)
Glucose-Capillary: 133 mg/dL — ABNORMAL HIGH (ref 70–99)
Glucose-Capillary: 147 mg/dL — ABNORMAL HIGH (ref 70–99)
Glucose-Capillary: 99 mg/dL (ref 70–99)

## 2020-07-11 LAB — BASIC METABOLIC PANEL
Anion gap: 10 (ref 5–15)
BUN: 31 mg/dL — ABNORMAL HIGH (ref 6–20)
CO2: 28 mmol/L (ref 22–32)
Calcium: 9.4 mg/dL (ref 8.9–10.3)
Chloride: 104 mmol/L (ref 98–111)
Creatinine, Ser: 1.61 mg/dL — ABNORMAL HIGH (ref 0.61–1.24)
GFR, Estimated: 56 mL/min — ABNORMAL LOW (ref >60)
Glucose, Bld: 132 mg/dL — ABNORMAL HIGH (ref 70–99)
Potassium: 3.9 mmol/L (ref 3.5–5.1)
Sodium: 142 mmol/L (ref 135–145)

## 2020-07-11 LAB — MAGNESIUM: Magnesium: 2 mg/dL (ref 1.7–2.4)

## 2020-07-11 MED ORDER — VITAL 1.5 CAL PO LIQD
1000.0000 mL | ORAL | Status: DC
  Administered 2020-07-11 – 2020-07-17 (×6): 1000 mL

## 2020-07-11 MED ORDER — VITAMIN B-12 1000 MCG PO TABS
1000.0000 ug | ORAL_TABLET | Freq: Every day | ORAL | Status: DC
  Administered 2020-07-12 – 2020-07-30 (×19): 1000 ug
  Filled 2020-07-11 (×19): qty 1

## 2020-07-11 MED ORDER — PROSOURCE TF PO LIQD
45.0000 mL | Freq: Every day | ORAL | Status: DC
  Administered 2020-07-11 – 2020-07-18 (×34): 45 mL
  Filled 2020-07-11: qty 45

## 2020-07-11 NOTE — Anesthesia Postprocedure Evaluation (Signed)
Anesthesia Post Note  Patient: Nicholas Valdez  Procedure(s) Performed: TRACHEOSTOMY (N/A )  Patient location during evaluation: ICU Anesthesia Type: General Level of consciousness: sedated Pain management: pain level controlled Vital Signs Assessment: post-procedure vital signs reviewed and stable Respiratory status: patient remains intubated per anesthesia plan Cardiovascular status: stable Postop Assessment: no apparent nausea or vomiting Anesthetic complications: no   No complications documented.   Last Vitals:  Vitals:   07/11/20 0315 07/11/20 0400  BP:    Pulse:    Resp:    Temp:  (!) 39 C  SpO2: 92%     Last Pain:  Vitals:   07/11/20 0549  TempSrc:   PainSc: 0-No pain                 Rosanne Gutting

## 2020-07-11 NOTE — Progress Notes (Signed)
Placed 2 PIVs on patient, LUA and RAFA 20G x 1.88" via ultrasound. Per RN, R PICC to be removed for line holiday due to patient having fever and increase WBC.

## 2020-07-11 NOTE — Progress Notes (Signed)
PHARMACY CONSULT NOTE  Pharmacy Consult for Electrolyte Monitoring and Replacement   Recent Labs: Potassium (mmol/L)  Date Value  07/11/2020 3.9  02/12/2013 3.9   Magnesium (mg/dL)  Date Value  72/82/0601 2.0   Calcium (mg/dL)  Date Value  56/15/3794 9.4   Calcium, Total (mg/dL)  Date Value  32/76/1470 8.7   Albumin (g/dL)  Date Value  92/95/7473 2.3 (L)  02/12/2013 3.6   Phosphorus (mg/dL)  Date Value  40/37/0964 4.8 (H)   Sodium (mmol/L)  Date Value  07/11/2020 142  02/12/2013 139   Assessment: 39 year old male here with encephalopathy s/t alcohol withdrawal. Patient has been intubated and sedated since 11/16. 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 Pharmacy to manage electrolytes.  Tube feeds: Vital AF 1.2 Cal at 20 mL/hr  + PROSource TF 90 mL 5 times daily per tube  Free water: 200 mL every 4 hours (d/c 12/2)  Diuretics: IV Lasix 40 mg daily (started 12/1)  Bowel regimen: Lactulose 20 g BID (d/c 12/2)   Goal of Therapy:  Electrolytes WNL  Plan:   K 3.9, continue KCl 40 mEq per tube daily. Give when Lasix given, hold when Lasix held. Scr trending up today  Follow-up with AM labs tomorrow  Tressie Ellis 07/11/2020 1:30 PM

## 2020-07-11 NOTE — Progress Notes (Signed)
Shift summary:  - Patient remains vented and sedated.  - Fresh Trach.   - Weaning sedation as tolerated.

## 2020-07-11 NOTE — Plan of Care (Signed)

## 2020-07-11 NOTE — Progress Notes (Signed)
Nutrition Follow-up  DOCUMENTATION CODES:   Morbid obesity  INTERVENTION:  Initiate Vital 1.5 Cal at 40 mL/hr and after 12 hours advance to goal rate of 60 mL/hr (1440 ml goal daily volume). Provide PROSource TF 45 mL 5 times daily per tube. Goal regimen provides 2360 kcal, 152 grams of protein, 1094 mL H2O daily.  Monitor magnesium, potassium, and phosphorus daily for at least 3 days, MD to replete as needed, as pt is at risk for refeeding syndrome.  NUTRITION DIAGNOSIS:   Inadequate oral intake related to inability to eat as evidenced by NPO status.  Ongoing.  GOAL:   Patient will meet greater than or equal to 90% of their needs  Met with TF regimen.  MONITOR:   Vent status, Labs, Weight trends, TF tolerance, I & O's  REASON FOR ASSESSMENT:   Ventilator, Consult Enteral/tube feeding initiation and management  ASSESSMENT:   39 year old male with PMHx of EtOH abuse, polysubstance abuse, hepatitis C admitted with AMS, acute encephalopathy, EtOH withdrawal, suspected aspiration PNA, AKI, sepsis.  11/16 intubated 12/6 s/p tracheostomy tube placement  Patient is currently intubated on ventilator support MV: 10.3 L/min Temp (24hrs), Avg:101.1 F (38.4 C), Min:99.7 F (37.6 C), Max:102.5 F (39.2 C)  Medications reviewed and include: Diflucan, folic acid 1 mg daily per tube, Lasix 40 mg daily IV, Novolog 0-15 units Q4hrs, MVI, Protonix, potassium chloride 40 mEq daily per tube, thiamine 100 mg daily per tube, vitamin B12 1000 micrograms daily, Dilaudid gtt, Versed gtt.  Labs reviewed: CBG 113-133, BUN 31, Creatinine 1.61.  I/O: 2300 mL UOP yesterday (0.7 mL/kg/hr)  Weight trend: 146.6 kg on 12/7; +1.4 kg from 11/15  Enteral Access: small-bore NGT placed 12/6; terminates in mid to distal stomach per abdominal x-ray 12/6  TF regimen: Vital AF 1.2 Cal reduced to 20 mL/hr on 12/2 by MD + PROSource 90 mL 5 times daily per tube  Discussed with RN and on rounds. Plan to  advance tube feeds past trickle rate. RD will begin increasing calories slowly.  Diet Order:   Diet Order            Diet NPO time specified  Diet effective midnight                EDUCATION NEEDS:   No education needs have been identified at this time  Skin:  Skin Assessment: Skin Integrity Issues: Skin Integrity Issues:: Incisions Incisions: closed incision to neck  Last BM:  07/11/2020 per chart  Height:   Ht Readings from Last 1 Encounters:  07/11/20 _0  (1.778 m)   Weight:   Wt Readings from Last 1 Encounters:  07/11/20 (!) 146.6 kg   Ideal Body Weight:  75.5 kg  BMI:  Body mass index is 46.37 kg/m.  Estimated Nutritional Needs:   Kcal:  2300  Protein:  145-155 grams  Fluid:  >/= 2.2 L/day  Jacklynn Barnacle, MS, RD, LDN Pager number available on Amion

## 2020-07-11 NOTE — Progress Notes (Signed)
CRITICAL CARE NOTE NAME:  Nicholas Valdez, MRN:  [id number], DOB:  Dec 15, 1980, LOS: 21 ADMISSION DATE:  06/19/2020, INITIAL CONSULTATION DATE:  06/20/2020 REFERRING MD:  Dr. Cyndia Valdez, CHIEF COMPLAINT:  Altered Mental Status   Brief History   39 yo male significant history of ETOH and polysubstance abuse presented to the ED with altered mental status admitted to the ICU requiring precedex drip and ultimately intubation & mechanical ventilation.  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 Despite decreasing versed to 7 and completely stopping Propofol patient is only partially arousable. Will continue to wean sedation to RASS-0 with plan for SBT. 06/24/20- patient is being weaned off sedation as possible.  06/25/20-issues with hypoxia and asynchrony, patient has been intermittently febrile 11/25severe hypoxia, resp failure, failure to wean from ven 11/26-severe brain damage from ETOH abuse, severe DT' 11/27 failure to wean from ven 11/29- patient is difficult to wean with sedation, slight decrease in dilaudid patient tried to leave while intubated 11/30- plan to decrease sedation and repeat SBT with extubation if possible.  12/1- patient became severely aggitated, tachypneic, tachycardic and with heavy endotracheal secretions. He is febrile 12/2- Patient with extremely high tolerance to sedation have been unable to bring down dosing. Reviewed care plan with mother of patient Nicholas Valdez - plan for trache - ENT consult placed. 07/07/2020: Tracheostomy planned for 12/6, remains encephalopathic, does not follow commands, agitation when sedation is lightened. 07/08/2020: Moves all fours, still on vent, less agitated 12/6 TRACH placed by ENT   Procedures:  06/20/20 ETT >> 06/20/20 CVC 3 L >> will need PICC line 12/6  [facility]  Significant Diagnostic Tests:  06/19/20 CT head >> no acute intracranial abnormality 06/20/20 US abdomen >> mild hepatic steatosis 06/21/20 Echocardiogram >> LVEF 65-70%, no wall motion abnormalities, no other abnormalities noted Micro Data:  06/19/20 COVID-19 >> negative 06/19/20 Influenza PCR A/B >> negative 06/20/20 MRSA PCR >> negative 06/20/20 tracheal aspirate >> negative  Antimicrobials:  06/21/20 Unasyn >>12/3 07/05/2020 fluconazole>>  CC  follow up respiratory failure  SUBJECTIVE Patient remains critically ill Prognosis is guarded   BP 112/68 (BP Location: Right Arm)   Pulse 92   Temp (!) 102.2 F (39 C) (Oral)   Resp 20   Ht 5' 10"  (1.778 m)   Wt (!) 146.6 kg   SpO2 92%   BMI 46.37 kg/m    I/O last 3 completed shifts: In: 1519.8 [I.V.:799.8; NG/GT:720] Out: 2625 [Urine:2625] No intake/output data recorded.  SpO2: 92 % FiO2 (%): 30 %  Estimated body mass index is 46.37 kg/m as calculated from the following:   Height as of this encounter: 5' 10"  (1.778 m).   Weight as of this encounter: 146.6 kg.  SIGNIFICANT EVENTS   REVIEW OF SYSTEMS  PATIENT IS UNABLE TO PROVIDE COMPLETE REVIEW OF SYSTEMS DUE TO SEVERE CRITICAL ILLNESS        PHYSICAL EXAMINATION:  GENERAL:critically ill appearing, +resp distress HEAD: Normocephalic, atraumatic.  EYES: Pupils equal, round, reactive to light.  No scleral icterus.  MOUTH: Moist mucosal membrane. NECK: Supple.  S/p trach PULMONARY: +rhonchi, +wheezing CARDIOVASCULAR: S1 and S2. Regular rate and rhythm. No murmurs, rubs, or gallops.  GASTROINTESTINAL: Soft, nontender, -distended.  Positive bowel sounds.   MUSCULOSKELETAL: No swelling, clubbing, or edema.  NEUROLOGIC: obtunded, GCS<8 SKIN:intact,warm,dry  MEDICATIONS: I have  reviewed all medications and confirmed regimen as documented   CULTURE RESULTS   Recent Results (from the past 240 hour(s))  Culture, respiratory (non-expectorated)      Status: None   Collection Time: 07/05/20  2:12 PM   Specimen: Tracheal Aspirate; Respiratory  Result Value Ref Range Status   Specimen Description   Final    TRACHEAL ASPIRATE Performed at [facility], [address], [place], [place] [address]    Special Requests   Final    Normal Performed at [facility], [address] [place], [place] [address]    Gram Stain   Final    ABUNDANT WBC PRESENT, PREDOMINANTLY PMN RARE YEAST    Culture   Final    Normal respiratory flora-no Staph aureus or Pseudomonas seen Performed at [facility] [address], [place], [place] [address]    Report Status 07/07/2020 FINAL  Final  MRSA PCR Screening     Status: None   Collection Time: 07/10/20  1:44 PM   Specimen: Nasopharyngeal  Result Value Ref Range Status   MRSA by PCR NEGATIVE NEGATIVE Final    Comment:        The GeneXpert MRSA Assay (FDA approved for NASAL specimens only), is one component of a comprehensive MRSA colonization surveillance program. It is not intended to diagnose MRSA infection nor to guide or monitor treatment for MRSA infections. Performed at [facility], [address] [place], [place] [address]           IMAGING    DG Abd 1 View  Result Date: 07/10/2020 CLINICAL DATA:  NG tube placement EXAM: ABDOMEN - 1 VIEW COMPARISON:  None. FINDINGS: Enteric feeding tube is in place with the tip in the mid to distal stomach. IMPRESSION: Feeding tube tip in the mid to distal stomach. Electronically Signed   By: Rolm Baptise M.D.   On: 07/10/2020 14:08     Nutrition Status: Nutrition Problem: Inadequate oral intake Etiology: inability to eat Signs/Symptoms: NPO status Interventions: Tube feeding, Prostat, MVI     Indwelling Urinary Catheter continued, requirement due to   Reason to continue Indwelling Urinary Catheter strict Intake/Output monitoring for hemodynamic instability   Central Line/ continued, requirement due  to  Reason to continue [place] of central venous pressure or other hemodynamic parameters and poor IV access   Ventilator continued, requirement due to severe respiratory failure   Ventilator Sedation RASS 0 to -2      ASSESSMENT AND PLAN SYNOPSIS  Acute Hypoxic Respiratory failure secondary to acute encephalopathy due to Alcohol withdrawal + Aspiration Pneumonia  Severe ACUTE Hypoxic and Hypercapnic Respiratory Failure -continue Full MV support -continue Bronchodilator Therapy -Wean Fio2 and PEEP as tolerated -will perform SAT/SBT when respiratory parameters are met -VAP/VENT bundle implementation   Morbid obesity, possible OSA.   Will certainly impact respiratory mechanics, ventilator weaning  ACUTE KIDNEY INJURY/Renal Failure -continue Foley Catheter-assess need -Avoid nephrotoxic agents -Follow urine output, BMP -Ensure adequate renal perfusion, optimize oxygenation -Renal dose medications     NEUROLOGY Acute toxic metabolic encephalopathy, need for sedation Goal RASS -2 to -3   CARDIAC ICU monitoring  ID -continue IV abx as prescibed -follow up cultures  GI GI PROPHYLAXIS as indicated  NUTRITIONAL STATUS Nutrition Status: Nutrition Problem: Inadequate oral intake Etiology: inability to eat Signs/Symptoms: NPO status Interventions: Tube feeding, Prostat, MVI   DIET-->TF's as tolerated Constipation protocol as indicated  ENDO - will use ICU hypoglycemic\Hyperglycemia protocol if indicated  ELECTROLYTES -follow labs as needed -replace as needed -pharmacy consultation and following   DVT/GI PRX ordered and assessed TRANSFUSIONS AS NEEDED MONITOR FSBS I Assessed the need for Labs I Assessed the need for Foley I Assessed the need for Central Venous Line Family Discussion when available I Assessed the need for Mobilization I made an Assessment of medications to be adjusted accordingly Safety Risk assessment  completed   CASE DISCUSSED IN MULTIDISCIPLINARY ROUNDS WITH ICU TEAM  Critical Care Time devoted to patient care services described in this note is 38 minutes.   Overall, patient is critically ill, prognosis is guarded.     Corrin [place], M.D.  [facility]  Medical Director [place] Director [facility] Cardio-Pulmonary Department

## 2020-07-12 DIAGNOSIS — G9341 Metabolic encephalopathy: Secondary | ICD-10-CM | POA: Diagnosis not present

## 2020-07-12 DIAGNOSIS — F10231 Alcohol dependence with withdrawal delirium: Secondary | ICD-10-CM | POA: Diagnosis not present

## 2020-07-12 DIAGNOSIS — J9601 Acute respiratory failure with hypoxia: Secondary | ICD-10-CM | POA: Diagnosis not present

## 2020-07-12 LAB — BASIC METABOLIC PANEL
Anion gap: 10 (ref 5–15)
BUN: 37 mg/dL — ABNORMAL HIGH (ref 6–20)
CO2: 29 mmol/L (ref 22–32)
Calcium: 9.4 mg/dL (ref 8.9–10.3)
Chloride: 101 mmol/L (ref 98–111)
Creatinine, Ser: 1.49 mg/dL — ABNORMAL HIGH (ref 0.61–1.24)
GFR, Estimated: >60 mL/min (ref >60)
Glucose, Bld: 134 mg/dL — ABNORMAL HIGH (ref 70–99)
Potassium: 3.6 mmol/L (ref 3.5–5.1)
Sodium: 140 mmol/L (ref 135–145)

## 2020-07-12 LAB — CBC WITH DIFFERENTIAL/PLATELET
Abs Immature Granulocytes: 0.07 10*3/uL (ref 0.00–0.07)
Basophils Absolute: 0 10*3/uL (ref 0.0–0.1)
Basophils Relative: 0 %
Eosinophils Absolute: 1.1 10*3/uL — ABNORMAL HIGH (ref 0.0–0.5)
Eosinophils Relative: 9 %
HCT: 28.8 % — ABNORMAL LOW (ref 39.0–52.0)
Hemoglobin: 9.3 g/dL — ABNORMAL LOW (ref 13.0–17.0)
Immature Granulocytes: 1 %
Lymphocytes Relative: 9 %
Lymphs Abs: 1.1 10*3/uL (ref 0.7–4.0)
MCH: 35 pg — ABNORMAL HIGH (ref 26.0–34.0)
MCHC: 32.3 g/dL (ref 30.0–36.0)
MCV: 108.3 fL — ABNORMAL HIGH (ref 80.0–100.0)
Monocytes Absolute: 0.8 10*3/uL (ref 0.1–1.0)
Monocytes Relative: 6 %
Neutro Abs: 9.6 10*3/uL — ABNORMAL HIGH (ref 1.7–7.7)
Neutrophils Relative %: 75 %
Platelets: 280 10*3/uL (ref 150–400)
RBC: 2.66 MIL/uL — ABNORMAL LOW (ref 4.22–5.81)
RDW: 14 % (ref 11.5–15.5)
WBC: 12.8 10*3/uL — ABNORMAL HIGH (ref 4.0–10.5)
nRBC: 0 % (ref 0.0–0.2)

## 2020-07-12 LAB — MAGNESIUM: Magnesium: 2.2 mg/dL (ref 1.7–2.4)

## 2020-07-12 LAB — GLUCOSE, CAPILLARY
Glucose-Capillary: 119 mg/dL — ABNORMAL HIGH (ref 70–99)
Glucose-Capillary: 105 mg/dL — ABNORMAL HIGH (ref 70–99)
Glucose-Capillary: 134 mg/dL — ABNORMAL HIGH (ref 70–99)
Glucose-Capillary: 149 mg/dL — ABNORMAL HIGH (ref 70–99)
Glucose-Capillary: 133 mg/dL — ABNORMAL HIGH (ref 70–99)
Glucose-Capillary: 170 mg/dL — ABNORMAL HIGH (ref 70–99)

## 2020-07-12 LAB — PHOSPHORUS: Phosphorus: 5.1 mg/dL — ABNORMAL HIGH (ref 2.5–4.6)

## 2020-07-12 MED ORDER — PROPOFOL 1000 MG/100ML IV EMUL
INTRAVENOUS | Status: AC
  Filled 2020-07-12: qty 100

## 2020-07-12 MED ORDER — OXYCODONE HCL 5 MG PO TABS
10.0000 mg | ORAL_TABLET | ORAL | Status: DC
  Administered 2020-07-12 – 2020-07-25 (×75): 10 mg
  Filled 2020-07-12 (×76): qty 2

## 2020-07-12 MED ORDER — MINOCYCLINE HCL 50 MG PO CAPS
200.0000 mg | ORAL_CAPSULE | Freq: Once | ORAL | Status: AC
  Administered 2020-07-12: 200 mg
  Filled 2020-07-12: qty 4

## 2020-07-12 MED ORDER — OXYCODONE HCL 5 MG PO TABS: 10.0000 mg | ORAL_TABLET | ORAL | Status: DC

## 2020-07-12 MED ORDER — MINOCYCLINE HCL 50 MG PO CAPS
100.0000 mg | ORAL_CAPSULE | Freq: Two times a day (BID) | ORAL | Status: AC
  Administered 2020-07-13 – 2020-07-18 (×12): 100 mg
  Filled 2020-07-12 (×12): qty 2

## 2020-07-12 NOTE — Progress Notes (Signed)
CRITICAL CARE NOTE 39 yo male significant history of ETOH and polysubstance abuse presented to the ED with altered mental status admitted to the ICU requiring precedex drip and ultimately intubation & mechanical ventilation.  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 Despite decreasing versed to 7 and completely stopping Propofol patient is only partially arousable. Will continue to wean sedation to RASS-0 with plan for SBT. 06/24/20- patient is being weaned off sedation as possible.  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  12/1- patient became severely aggitated, tachypneic, tachycardic and with heavy endotracheal secretions. He is febrile 12/2-Patient with extremely high tolerance to sedation have been unable to bring down dosing. Reviewed care plan with mother of patient [name] - plan for trache - ENT consult placed. 07/07/2020:Tracheostomy planned for 12/6, remains encephalopathic, does not follow commands, agitation when sedation is lightened. 07/08/2020:Moves all fours, still on vent, less agitated 12/6 TRACH placed by ENT 12/8 wean sedation, assess for PS trials   Procedures:  06/20/20 ETT >> 06/20/20 CVC 3 L >> will need PICC line 12/6 [facility]  Significant Diagnostic Tests:  06/19/20 CT head >> no acute intracranial abnormality 06/20/20 US abdomen>>mild hepatic steatosis 06/21/20 Echocardiogram>>LVEF 65-70%, no wall  motion abnormalities, no other abnormalities noted Micro Data:  06/19/20 COVID-19 >> negative 06/19/20 Influenza PCR A/B >> negative 06/20/20 MRSA PCR >> negative 06/20/20 tracheal aspirate >> negative  Antimicrobials:  06/21/20 Unasyn >>12/3 07/05/2020 fluconazole>>12/8  CC  follow up respiratory failure  SUBJECTIVE Patient remains critically ill Prognosis is guarded S/p trach    BP 111/77   Pulse 81   Temp 99.6 F (37.6 C) (Axillary)   Resp 20   Ht 5' 10"  (1.778 m)   Wt (!) 143.2 kg   SpO2 92%   BMI 45.30 kg/m    I/O last 3 completed shifts: In: 2432.5 [I.V.:683.8; Other:250; NG/GT:1498.7] Out: 1400 [Urine:1400] No intake/output data recorded.  SpO2: 92 % FiO2 (%): 30 %  Estimated body mass index is 45.3 kg/m as calculated from the following:   Height as of this encounter: 5' 10"  (1.778 m).   Weight as of this encounter: 143.2 kg.  SIGNIFICANT EVENTS   REVIEW OF SYSTEMS  PATIENT IS UNABLE TO PROVIDE COMPLETE REVIEW OF SYSTEMS DUE TO SEVERE CRITICAL ILLNESS        PHYSICAL EXAMINATION:  GENERAL:critically ill appearing, +resp distress HEAD: Normocephalic, atraumatic.  EYES: Pupils equal, round, reactive to light.  No scleral icterus.  MOUTH: Moist mucosal membrane. NECK: Supple.  PULMONARY: +rhonchi, +wheezing CARDIOVASCULAR: S1 and S2. Regular rate and rhythm. No murmurs, rubs, or gallops.  GASTROINTESTINAL: Soft, nontender, -distended.  Positive bowel sounds.   MUSCULOSKELETAL: No swelling, clubbing, or edema.  NEUROLOGIC: obtunded, GCS<8 SKIN:intact,warm,dry  MEDICATIONS: I have reviewed all medications and confirmed regimen as documented   CULTURE RESULTS   Recent Results (from the past 240 hour(s))  Culture, respiratory (non-expectorated)     Status: None   Collection Time: 07/05/20  2:12 PM   Specimen: Tracheal Aspirate; Respiratory  Result Value Ref Range Status   Specimen Description   Final    TRACHEAL ASPIRATE Performed at  [facility], [address] [place], [place] [address]    Special Requests   Final    Normal Performed at [facility], [address] [place], [place] [address]    Gram Stain   Final    ABUNDANT WBC PRESENT, PREDOMINANTLY PMN RARE YEAST    Culture   Final    Normal respiratory flora-no Staph aureus or Pseudomonas seen Performed at [facility] [address], [place], [place] [address]    Report Status 07/07/2020 FINAL  Final  Culture, respiratory (non-expectorated)     Status: None (Preliminary result)   Collection Time: 07/10/20 10:53 AM   Specimen: Tracheal Aspirate; Respiratory  Result Value Ref Range Status   Specimen Description   Final    TRACHEAL ASPIRATE Performed at [facility], [address] [place], [place] [address]    Special Requests   Final    NONE Performed at [facility], [address] [place], [place] [address]    Gram Stain   Final    FEW WBC PRESENT, PREDOMINANTLY PMN RARE GRAM NEGATIVE RODS    Culture   Final    MODERATE ELIZABETHKINGIA MENINGOSEPTICA SUSCEPTIBILITIES TO FOLLOW Performed at [facility], [place] [address] [place], [place] [address]    Report Status PENDING  Incomplete  MRSA PCR Screening     Status: None   Collection Time: 07/10/20  1:44 PM   Specimen: Nasopharyngeal  Result Value Ref Range Status   MRSA by PCR NEGATIVE NEGATIVE Final    Comment:        The GeneXpert MRSA Assay (FDA approved for NASAL specimens only), is one component of a comprehensive MRSA colonization surveillance program. It is not intended to diagnose MRSA infection nor to guide or monitor treatment for MRSA infections. Performed at [facility], [address] [place], [place] [address]         BMP Latest Ref Rng & Units 07/12/2020 07/11/2020 07/10/2020  Glucose 70 - 99 mg/dL 134(H) 132(H) -  BUN 6 - 20 mg/dL 37(H) 31(H) -  Creatinine 0.61 - 1.24 mg/dL 1.49(H) 1.61(H) -  Sodium  135 - 145 mmol/L 140 142 -  Potassium 3.5 - 5.1 mmol/L 3.6 3.9 3.7  Chloride 98 - 111 mmol/L 101 104 -  CO2 22 - 32 mmol/L 29 28 -  Calcium 8.9 - 10.3 mg/dL 9.4 9.4 -     IMAGING    DG Abd Portable 1V  Result Date: 07/11/2020 CLINICAL DATA:  NG tube placement EXAM: PORTABLE ABDOMEN - 1 VIEW COMPARISON:  July 10, 2020 FINDINGS: The NG tube projects over the gastric body. The visualized bowel gas pattern is nonspecific. IMPRESSION: NG tube projects over the gastric body. Electronically Signed   By: Constance Holster M.D.   On: 07/11/2020 22:18     Nutrition Status: Nutrition Problem: Inadequate oral intake Etiology: inability to eat Signs/Symptoms: NPO status Interventions: Tube feeding, Prostat, MVI     Indwelling Urinary Catheter continued, requirement due to   Reason to continue Indwelling Urinary Catheter strict Intake/Output monitoring for hemodynamic instability   Central Line/ continued, requirement due to  Reason to continue [place] of central venous pressure or other hemodynamic parameters and poor IV access   Ventilator continued, requirement due to severe respiratory failure   Ventilator Sedation RASS 0 to -2  ASSESSMENT AND PLAN SYNOPSIS Acute Hypoxic Respiratory failure secondary to acute encephalopathy due to Alcohol withdrawal + Aspiration Pneumonia  Severe ACUTE Hypoxic and Hypercapnic Respiratory Failure -continue Full MV support -continue Bronchodilator Therapy -Wean Fio2 and PEEP as tolerated -will perform SAT/SBT when respiratory parameters are met -VAP/VENT bundle implementation   Morbid obesity, possible OSA.   Will certainly impact respiratory mechanics  ACUTE KIDNEY INJURY/Renal Failure -continue Foley Catheter-assess need -Avoid nephrotoxic agents -Follow urine output, BMP -Ensure adequate renal perfusion, optimize oxygenation -Renal dose medications     NEUROLOGY Acute toxic metabolic encephalopathy, need  for sedation Goal RASS -2 to -3 Brain damage from ETOh abuse and withdrawal  CARDIAC ICU monitoring  ID -continue IV abx as prescibed -follow up cultures  GI GI PROPHYLAXIS as indicated  NUTRITIONAL STATUS Nutrition Status: Nutrition Problem: Inadequate oral intake Etiology: inability to eat Signs/Symptoms: NPO status Interventions: Tube feeding, Prostat, MVI   DIET-->TF's as tolerated Constipation protocol as indicated  ENDO - will use ICU hypoglycemic\Hyperglycemia protocol if indicated     ELECTROLYTES -follow labs as needed -replace as needed -pharmacy consultation and following   DVT/GI PRX ordered and assessed TRANSFUSIONS AS NEEDED MONITOR FSBS I Assessed the need for Labs I Assessed the need for Foley I Assessed the need for Central Venous Line Family Discussion when available I Assessed the need for Mobilization I made an Assessment of medications to be adjusted accordingly Safety Risk assessment completed   CASE DISCUSSED IN MULTIDISCIPLINARY ROUNDS WITH ICU TEAM  Critical Care Time devoted to patient care services described in this note is 45 minutes.   Overall, patient is critically ill, prognosis is guarded.  Patient with Multiorgan failure and at high risk for cardiac arrest and death.    Corrin [place], M.D.  [facility]  Medical Director [place] Director [facility] Cardio-Pulmonary Department

## 2020-07-12 NOTE — Progress Notes (Signed)
PHARMACY CONSULT NOTE  Pharmacy Consult for Electrolyte Monitoring and Replacement   Recent Labs: Potassium (mmol/L)  Date Value  07/12/2020 3.6  02/12/2013 3.9   Magnesium (mg/dL)  Date Value  29/24/4628 2.2   Calcium (mg/dL)  Date Value  63/81/7711 9.4   Calcium, Total (mg/dL)  Date Value  65/79/0383 8.7   Albumin (g/dL)  Date Value  33/83/2919 2.3 (L)  02/12/2013 3.6   Phosphorus (mg/dL)  Date Value  16/60/6004 5.1 (H)   Sodium (mmol/L)  Date Value  07/12/2020 140  02/12/2013 139   Assessment: 39 year old male here with encephalopathy s/t alcohol withdrawal. Patient has been intubated and sedated since 11/16. 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 Pharmacy to manage electrolytes.  Tube feeds: Vital AF 1.2 Cal at 20 mL/hr  + PROSource TF 90 mL 5 times daily per tube  Free water: 200 mL every 4 hours (d/c 12/2)  Diuretics: IV Lasix 40 mg daily (started 12/1)  Bowel regimen: Lactulose 20 g BID (d/c 12/2)   Goal of Therapy:  Electrolytes WNL  Plan:   K 3.6, continue KCl 40 mEq per tube daily. Give when Lasix given, hold when Lasix held. Scr trending back down today   Follow-up with AM labs tomorrow  Tressie Ellis 07/12/2020 8:20 AM

## 2020-07-13 LAB — MAGNESIUM: Magnesium: 2.2 mg/dL (ref 1.7–2.4)

## 2020-07-13 LAB — GLUCOSE, CAPILLARY
Glucose-Capillary: 157 mg/dL — ABNORMAL HIGH (ref 70–99)
Glucose-Capillary: 135 mg/dL — ABNORMAL HIGH (ref 70–99)
Glucose-Capillary: 140 mg/dL — ABNORMAL HIGH (ref 70–99)
Glucose-Capillary: 116 mg/dL — ABNORMAL HIGH (ref 70–99)
Glucose-Capillary: 161 mg/dL — ABNORMAL HIGH (ref 70–99)

## 2020-07-13 LAB — CBC WITH DIFFERENTIAL/PLATELET
Abs Immature Granulocytes: 0.05 10*3/uL (ref 0.00–0.07)
Basophils Absolute: 0 10*3/uL (ref 0.0–0.1)
Basophils Relative: 0 %
Eosinophils Absolute: 1 10*3/uL — ABNORMAL HIGH (ref 0.0–0.5)
Eosinophils Relative: 14 %
HCT: 28.3 % — ABNORMAL LOW (ref 39.0–52.0)
Hemoglobin: 9.1 g/dL — ABNORMAL LOW (ref 13.0–17.0)
Immature Granulocytes: 1 %
Lymphocytes Relative: 15 %
Lymphs Abs: 1.1 10*3/uL (ref 0.7–4.0)
MCH: 34.7 pg — ABNORMAL HIGH (ref 26.0–34.0)
MCHC: 32.2 g/dL (ref 30.0–36.0)
MCV: 108 fL — ABNORMAL HIGH (ref 80.0–100.0)
Monocytes Absolute: 0.8 10*3/uL (ref 0.1–1.0)
Monocytes Relative: 12 %
Neutro Abs: 4.2 10*3/uL (ref 1.7–7.7)
Neutrophils Relative %: 58 %
Platelets: 294 10*3/uL (ref 150–400)
RBC: 2.62 MIL/uL — ABNORMAL LOW (ref 4.22–5.81)
RDW: 13.8 % (ref 11.5–15.5)
WBC: 7.2 10*3/uL (ref 4.0–10.5)
nRBC: 0 % (ref 0.0–0.2)

## 2020-07-13 LAB — CULTURE, RESPIRATORY W GRAM STAIN

## 2020-07-13 LAB — BASIC METABOLIC PANEL
Anion gap: 11 (ref 5–15)
BUN: 37 mg/dL — ABNORMAL HIGH (ref 6–20)
CO2: 32 mmol/L (ref 22–32)
Calcium: 9.1 mg/dL (ref 8.9–10.3)
Chloride: 103 mmol/L (ref 98–111)
Creatinine, Ser: 1.27 mg/dL — ABNORMAL HIGH (ref 0.61–1.24)
GFR, Estimated: >60 mL/min (ref >60)
Glucose, Bld: 160 mg/dL — ABNORMAL HIGH (ref 70–99)
Potassium: 4 mmol/L (ref 3.5–5.1)
Sodium: 146 mmol/L — ABNORMAL HIGH (ref 135–145)

## 2020-07-13 LAB — PHOSPHORUS: Phosphorus: 5 mg/dL — ABNORMAL HIGH (ref 2.5–4.6)

## 2020-07-13 LAB — AMMONIA: Ammonia: 22 umol/L (ref 9–35)

## 2020-07-13 MED ORDER — CALAMINE EX LOTN
TOPICAL_LOTION | CUTANEOUS | Status: DC | PRN
  Administered 2020-07-13: 1 via TOPICAL
  Filled 2020-07-13: qty 177

## 2020-07-13 MED ORDER — NOREPINEPHRINE 4 MG/250ML-% IV SOLN
INTRAVENOUS | Status: AC
  Filled 2020-07-13: qty 250

## 2020-07-13 MED ORDER — LORAZEPAM 1 MG PO TABS
1.0000 mg | ORAL_TABLET | ORAL | Status: AC | PRN
  Filled 2020-07-13: qty 4

## 2020-07-13 MED ORDER — LORAZEPAM 2 MG/ML IJ SOLN
1.0000 mg | INTRAMUSCULAR | Status: AC | PRN
  Administered 2020-07-13 – 2020-07-16 (×15): 4 mg via INTRAVENOUS
  Filled 2020-07-13 (×10): qty 2
  Filled 2020-07-13: qty 1
  Filled 2020-07-13 (×3): qty 2
  Filled 2020-07-13: qty 1
  Filled 2020-07-13: qty 2

## 2020-07-13 NOTE — Progress Notes (Signed)
Neuro: Versed D/C'd, patient has required multiple PRN doses of Ativan to decrease irritability and agitation, he will not make eye contact but opens his eyes easily, will not follow commands but has purposeful movement, requires 1:1 sitter, CIWA protocol started Resp: tolerating vent settings well CV: TMAX 38.3, blood pressure elevated with sedation decrease GIGU: external catheter in place, NG in place, tolerating feeds well, no BM no emesis Skin: rash on bilateral lower extremities, calamine applied Social: update given to [place], all questions and concerns addressed

## 2020-07-13 NOTE — Progress Notes (Signed)
Patient is currently resting comfortably. He has a trach/vent that he is tolerating well. He is receiving continuous IV versed and dilaudid for comfort and sedation. All vitals are WDL. Will continue to monitor.  Carmel [place], RN

## 2020-07-13 NOTE — Progress Notes (Addendum)
PHARMACY CONSULT NOTE  Pharmacy Consult for Electrolyte Monitoring and Replacement   Recent Labs: Potassium (mmol/L)  Date Value  07/13/2020 4.0  02/12/2013 3.9   Magnesium (mg/dL)  Date Value  47/34/0370 2.2   Calcium (mg/dL)  Date Value  96/43/8381 9.1   Calcium, Total (mg/dL)  Date Value  84/10/7541 8.7   Albumin (g/dL)  Date Value  60/67/7034 2.3 (L)  02/12/2013 3.6   Phosphorus (mg/dL)  Date Value  03/52/4818 5.0 (H)   Sodium (mmol/L)  Date Value  07/13/2020 146 (H)  02/12/2013 139   Assessment: 39 year old male here with encephalopathy s/t alcohol withdrawal. Patient has been intubated and sedated since 11/16. 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 Pharmacy to manage electrolytes.  Tube feeds: Vital AF 1.2 Cal at 60 mL/hr  + PROSource TF 90 mL 5 times daily per tube  Free water: 200 mL every 4 hours (d/c 12/2)  Diuretics: IV Lasix 40 mg daily (started 12/1, d/c 12/9)  Bowel regimen: Lactulose 20 g BID (d/c 12/2)   Goal of Therapy:  Electrolytes WNL  Plan:   Lasix discontinued today. Will discontinue scheduled potassium supplement  Sodium up to 146 today, free water flushes on hold since 12/2. Continue to monitor  Follow-up with AM labs tomorrow  Tressie Ellis 07/13/2020 8:27 AM

## 2020-07-13 NOTE — Progress Notes (Signed)
CRITICAL CARE NOTE 39 yo male significant history of ETOH and polysubstance abuse presented to the ED with altered mental status admitted to the ICU requiring precedex drip and ultimately intubation & mechanical ventilation.  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 Despite decreasing versed to 7 and completely stopping Propofol patient is only partially arousable. Will continue to wean sedation to RASS-0 with plan for SBT. 06/24/20- patient is being weaned off sedation as possible.  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  12/1- patient became severely aggitated, tachypneic, tachycardic and with heavy endotracheal secretions. He is febrile 12/2-Patient with extremely high tolerance to sedation have been unable to bring down dosing. Reviewed care plan with mother of patient [name] - plan for trache - ENT consult placed. 07/07/2020:Tracheostomy planned for 12/6, remains encephalopathic, does not follow commands, agitation when sedation is lightened. 07/08/2020:Moves all fours, still on vent, less agitated 12/6TRACH placed by ENT 12/8 wean sedation, assess for PS trials 12/8 ELIZABETHKINGIA SPECIES in TA started ABX   Procedures:  06/20/20 ETT >> 06/20/20 CVC 3 L >> will need PICC line 12/6 [facility]  Significant Diagnostic Tests:  06/19/20 CT head >> no acute intracranial abnormality 06/20/20 US abdomen>>mild hepatic  steatosis 06/21/20 Echocardiogram>>LVEF 65-70%, no wall motion abnormalities, no other abnormalities noted Micro Data:  06/19/20 COVID-19 >> negative 06/19/20 Influenza PCR A/B >> negative 06/20/20 MRSA PCR >> negative 06/20/20 tracheal aspirate >> negative  Antimicrobials:  06/21/20 Unasyn >>12/3 07/05/2020 fluconazole>>12/8   CC  follow up respiratory failure  SUBJECTIVE Patient remains critically ill Prognosis is guarded S/p trach   BP 102/64   Pulse 69   Temp 99 F (37.2 C) (Axillary)   Resp 20   Ht _0  (1.778 m)   Wt (!) 150.1 kg   SpO2 96%   BMI 47.48 kg/m    I/O last 3 completed shifts: In: 2124.4 [I.V.:829.4; NG/GT:1295] Out: 3419 [Urine:2600] No intake/output data recorded.  SpO2: 96 % FiO2 (%): 35 %  Estimated body mass index is 47.48 kg/m as calculated from the following:   Height as of this encounter: _1  (1.778 m).   Weight as of this encounter: 150.1 kg.  SIGNIFICANT EVENTS   REVIEW OF SYSTEMS  PATIENT IS UNABLE TO PROVIDE COMPLETE REVIEW OF SYSTEMS DUE TO SEVERE CRITICAL ILLNESS        PHYSICAL EXAMINATION:  GENERAL:critically ill appearing, +resp distress NECK: Supple.  S/p Trach PULMONARY: +rhonchi, +wheezing CARDIOVASCULAR: S1 and S2. Regular rate and rhythm. No murmurs, rubs, or gallops.  GASTROINTESTINAL: Soft, nontender, -distended.  Positive bowel sounds.   MUSCULOSKELETAL: No swelling, clubbing, or edema.  NEUROLOGIC: obtunded, GCS<8 SKIN:intact,warm,dry  MEDICATIONS: I have reviewed all medications and confirmed regimen as documented   CULTURE RESULTS   Recent Results (from the past 240 hour(s))  Culture, respiratory (non-expectorated)     Status: None   Collection Time: 07/05/20  2:12 PM   Specimen: Tracheal Aspirate; Respiratory  Result Value Ref Range Status   Specimen Description   Final  TRACHEAL ASPIRATE Performed at [facility], [address] [place], [place] [address]    Special  Requests   Final    Normal Performed at [facility], [address] [place], [place] [address]    Gram Stain   Final    ABUNDANT WBC PRESENT, PREDOMINANTLY PMN RARE YEAST    Culture   Final    Normal respiratory flora-no Staph aureus or Pseudomonas seen Performed at [facility] [address], [place], [place] [address]    Report Status 07/07/2020 FINAL  Final  Culture, respiratory (non-expectorated)     Status: None (Preliminary result)   Collection Time: 07/10/20 10:53 AM   Specimen: Tracheal Aspirate; Respiratory  Result Value Ref Range Status   Specimen Description   Final    TRACHEAL ASPIRATE Performed at [facility], [address] [place], [place] [address]    Special Requests   Final    NONE Performed at [facility], [address] [place], [place] [address]    Gram Stain   Final    FEW WBC PRESENT, PREDOMINANTLY PMN RARE GRAM NEGATIVE RODS    Culture   Final    MODERATE ELIZABETHKINGIA MENINGOSEPTICA SUSCEPTIBILITIES TO FOLLOW Performed at [facility], [place] [address] [place], [place] [address]    Report Status PENDING  Incomplete  MRSA PCR Screening     Status: None   Collection Time: 07/10/20  1:44 PM   Specimen: Nasopharyngeal  Result Value Ref Range Status   MRSA by PCR NEGATIVE NEGATIVE Final    Comment:        The GeneXpert MRSA Assay (FDA approved for NASAL specimens only), is one component of a comprehensive MRSA colonization surveillance program. It is not intended to diagnose MRSA infection nor to guide or monitor treatment for MRSA infections. Performed at [facility], [address] [place], [place] [address]           IMAGING    No results found.   Nutrition Status: Nutrition Problem: Inadequate oral intake Etiology: inability to eat Signs/Symptoms: NPO status Interventions: Tube feeding,Prostat,MVI     Indwelling Urinary Catheter continued, requirement due to    Reason to continue Indwelling Urinary Catheter strict Intake/Output monitoring for hemodynamic instability         Ventilator continued, requirement due to severe respiratory failure   Ventilator Sedation RASS 0 to -2      ASSESSMENT AND PLAN SYNOPSIS  Acute Hypoxic Respiratory failure secondary to acute encephalopathy due to Alcohol withdrawal s/p trach +Acute Hypoxic Respiratory failure secondary to acute encephalopathy due to Alcohol withdrawal +ELIZABETHKINGIA SPECIES in TA started ABX Pneumonia    Severe ACUTE Hypoxic and Hypercapnic Respiratory Failure -continue Full MV support -continue Bronchodilator Therapy -Wean Fio2 and PEEP as tolerated -will perform SAT/SBT when respiratory parameters are met -VAP/VENT bundle implementation  ACUTE DIASTOLIC CARDIAC FAILURE-  -oxygen as needed -Lasix as tolerated   Morbid obesity, possible OSA.   Will certainly impact respiratory mechanics, ventilator weaning Suspect will need to consider additional PEEP   ACUTE KIDNEY INJURY/Renal Failure -continue Foley Catheter-assess need -Avoid nephrotoxic agents -Follow urine output, BMP -Ensure adequate renal perfusion, optimize oxygenation -Renal dose medications     NEUROLOGY Acute toxic metabolic encephalopathy, need for sedation Goal RASS -2 to -3 We started Scheduled Valium and Pain meds Wean off all IV infusions   CARDIAC ICU monitoring  INFECTIOUS DISEASE -continue antibiotics as prescribed -follow up cultures -follow up ID consultation  GI GI PROPHYLAXIS as indicated  NUTRITIONAL STATUS Nutrition Status: Nutrition Problem: Inadequate oral intake Etiology: inability to eat Signs/Symptoms: NPO status Interventions: Tube feeding,Prostat,MVI   DIET-->TF's as tolerated Constipation protocol as indicated  ENDO - will use ICU hypoglycemic\Hyperglycemia protocol if indicated     ELECTROLYTES -follow labs as needed -replace as  needed -pharmacy consultation and following   DVT/GI PRX ordered and assessed TRANSFUSIONS AS NEEDED MONITOR FSBS I Assessed the need for Labs I Assessed the need for Foley I Assessed the need for Central Venous Line Family Discussion when available I Assessed the need for Mobilization I made an Assessment of medications to be adjusted accordingly Safety Risk assessment completed   CASE DISCUSSED IN MULTIDISCIPLINARY ROUNDS WITH ICU TEAM  Critical Care Time devoted to patient care services described in this note is 45 minutes.   Overall, patient is critically ill, prognosis is guarded.     Corrin Parker, M.D.  [facility]  Medical Director [place] Director [facility] Cardio-Pulmonary Department

## 2020-07-14 LAB — CBC WITH DIFFERENTIAL/PLATELET
Abs Immature Granulocytes: 0.07 10*3/uL (ref 0.00–0.07)
Basophils Absolute: 0 10*3/uL (ref 0.0–0.1)
Basophils Relative: 1 %
Eosinophils Absolute: 1.1 10*3/uL — ABNORMAL HIGH (ref 0.0–0.5)
Eosinophils Relative: 16 %
HCT: 29.6 % — ABNORMAL LOW (ref 39.0–52.0)
Hemoglobin: 9.4 g/dL — ABNORMAL LOW (ref 13.0–17.0)
Immature Granulocytes: 1 %
Lymphocytes Relative: 22 %
Lymphs Abs: 1.6 10*3/uL (ref 0.7–4.0)
MCH: 34.2 pg — ABNORMAL HIGH (ref 26.0–34.0)
MCHC: 31.8 g/dL (ref 30.0–36.0)
MCV: 107.6 fL — ABNORMAL HIGH (ref 80.0–100.0)
Monocytes Absolute: 1 10*3/uL (ref 0.1–1.0)
Monocytes Relative: 14 %
Neutro Abs: 3.3 10*3/uL (ref 1.7–7.7)
Neutrophils Relative %: 46 %
Platelets: 229 10*3/uL (ref 150–400)
RBC: 2.75 MIL/uL — ABNORMAL LOW (ref 4.22–5.81)
RDW: 13.8 % (ref 11.5–15.5)
WBC: 7.1 10*3/uL (ref 4.0–10.5)
nRBC: 0 % (ref 0.0–0.2)

## 2020-07-14 LAB — BASIC METABOLIC PANEL
Anion gap: 12 (ref 5–15)
BUN: 36 mg/dL — ABNORMAL HIGH (ref 6–20)
CO2: 28 mmol/L (ref 22–32)
Calcium: 9.2 mg/dL (ref 8.9–10.3)
Chloride: 103 mmol/L (ref 98–111)
Creatinine, Ser: 1.14 mg/dL (ref 0.61–1.24)
GFR, Estimated: >60 mL/min (ref >60)
Glucose, Bld: 143 mg/dL — ABNORMAL HIGH (ref 70–99)
Potassium: 4.7 mmol/L (ref 3.5–5.1)
Sodium: 143 mmol/L (ref 135–145)

## 2020-07-14 LAB — GLUCOSE, CAPILLARY
Glucose-Capillary: 113 mg/dL — ABNORMAL HIGH (ref 70–99)
Glucose-Capillary: 133 mg/dL — ABNORMAL HIGH (ref 70–99)

## 2020-07-14 MED ORDER — OLANZAPINE 10 MG IM SOLR
10.0000 mg | Freq: Once | INTRAMUSCULAR | Status: AC | PRN
  Administered 2020-07-14 – 2020-07-15 (×3): 10 mg via INTRAMUSCULAR
  Filled 2020-07-14 (×5): qty 10

## 2020-07-14 NOTE — Progress Notes (Signed)
Patient is currently resting comfortably. He has a trach/vent that he is tolerating well. He is receiving continuous IV dilaudid for comfort and sedation. Safety sitter was in the room for several hours to ensure patient safety. All vitals are WDL. Will continue to monitor.  Carmel Sacramento, RN

## 2020-07-14 NOTE — Progress Notes (Signed)
CRITICAL CARE NOTE  CC  follow up respiratory failure Alcohol withdrawal S/p tracheostomy  SUBJECTIVE Patient remains critically ill Prognosis is guarded Remains on vent with agitation when sedatives/narcotics are withdrawn.    SIGNIFICANT EVENTS 06/20/20 Admit to ICU, intubated 06/23/20-patient unable to have SBT today due to sedation. Despite decreasing versed to 7 and completely stopping Propofol patient is only partially arousable. Will continue to wean sedation to RASS-0 with plan for SBT. 06/24/20- patient is being weaned off sedation as possible.  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  12/1- patient became severely aggitated, tachypneic, tachycardic and with heavy endotracheal secretions. He is febrile 12/2-Patient with extremely high tolerance to sedation have been unable to bring down dosing. Reviewed care plan with mother of patient Karen - plan for trache - ENT consult placed. 07/07/2020:Tracheostomy planned for 12/6, remains encephalopathic, does not follow commands, agitation when sedation is lightened. 07/08/2020:Moves all fours, still on vent, less agitated 12/6TRACH placed by ENT 12/8wean sedation, assess for PS trials 12/8 ELIZABETHKINGIA SPECIES in TA started ABX                              12/9 no sig events  BP (!) 104/58 (BP Location: Left Wrist)   Pulse 87   Temp 99.5 F (37.5 C) (Axillary)   Resp 18   Ht 5' 10" (1.778 m)   Wt (!) 143.4 kg   SpO2 94%   BMI 45.36 kg/m    REVIEW OF SYSTEMS  PATIENT IS UNABLE TO PROVIDE COMPLETE REVIEW OF SYSTEMS DUE TO SEVERE  CRITICAL ILLNESS   PHYSICAL EXAMINATION:  GENERAL:critically ill appearing, resp distress HEAD: Normocephalic, atraumatic.  EYES: Pupils equal, round, reactive to light.  No scleral icterus.  MOUTH: Moist mucosal membrane. NECK: Supple. No thyromegaly. No nodules. No JVD.  PULMONARY: No wheezing or rhonchi CARDIOVASCULAR: S1 and S2. Regular rate and rhythm. No murmurs, rubs, or gallops.  GASTROINTESTINAL: Soft, nontender, -distended. No masses. Positive bowel sounds. No hepatosplenomegaly.  MUSCULOSKELETAL: No swelling, clubbing, +1 edema.  NEUROLOGIC: Sedated on vent  SKIN:intact,warm,dry  INTAKE/OUTPUT  Intake/Output Summary (Last 24 hours) at 07/14/2020 0910 Last data filed at 07/14/2020 0851 Gross per 24 hour  Intake 1704.31 ml  Output 1100 ml  Net 604.31 ml    LABS  CBC Recent Labs  Lab 07/12/20 0641 07/13/20 0632 07/14/20 0644  WBC 12.8* 7.2 7.1  HGB 9.3* 9.1* 9.4*  HCT 28.8* 28.3* 29.6*  PLT 280 294 229   Coag's No results for input(s): APTT, INR in the last 168 hours. BMET Recent Labs  Lab 07/12/20 0641 07/13/20 0632 07/14/20 0644  NA 140 146* 143  K 3.6 4.0 4.7  CL 101 103 103  CO2 29 32 28  BUN 37* 37* 36*  CREATININE 1.49* 1.27* 1.14  GLUCOSE 134* 160* 143*   Electrolytes Recent Labs  Lab 07/09/20 0625 07/10/20 0627 07/11/20 0545 07/12/20 0641 07/13/20 0632 07/14/20 0644  CALCIUM 9.5  --  9.4 9.4 9.1 9.2  MG 2.1   < > 2.0 2.2 2.2  --   PHOS 4.8*  --   --  5.1* 5.0*  --    < > =   values in this interval not displayed.   Sepsis Markers Recent Labs  Lab 07/10/20 0627  PROCALCITON 0.14   ABG No results for input(s): PHART, PCO2ART, PO2ART in the last 168 hours. Liver Enzymes No results for input(s): AST, ALT, ALKPHOS, BILITOT, ALBUMIN in the last 168 hours. Cardiac Enzymes No results for input(s): TROPONINI, PROBNP in the last 168 hours. Glucose Recent Labs  Lab 07/12/20 2308 07/13/20 0322 07/13/20 0737 07/13/20 1601  07/13/20 1925 07/13/20 2322  GLUCAP 170* 157* 135* 140* 116* 161*     Recent Results (from the past 240 hour(s))  Culture, respiratory (non-expectorated)     Status: None   Collection Time: 07/05/20  2:12 PM   Specimen: Tracheal Aspirate; Respiratory  Result Value Ref Range Status   Specimen Description   Final    TRACHEAL ASPIRATE Performed at [facility], [address] [place], [place] [address]    Special Requests   Final    Normal Performed at [facility], [address] [place], [place] [address]    Gram Stain   Final    ABUNDANT WBC PRESENT, PREDOMINANTLY PMN RARE YEAST    Culture   Final    Normal respiratory flora-no Staph aureus or Pseudomonas seen Performed at [facility], [address] [place], [place] [address]    Report Status 07/07/2020 FINAL  Final  Culture, respiratory (non-expectorated)     Status: None (Preliminary result)   Collection Time: 07/10/20 10:53 AM   Specimen: Tracheal Aspirate; Respiratory  Result Value Ref Range Status   Specimen Description   Final    TRACHEAL ASPIRATE Performed at [facility], [address] [place], [place] [address]    Special Requests   Final    NONE Performed at [facility], [address] [place], [place] [address]    Gram Stain   Final    FEW WBC PRESENT, PREDOMINANTLY PMN RARE GRAM NEGATIVE RODS    Culture   Final    MODERATE ELIZABETHKINGIA MENINGOSEPTICA SUSCEPTIBILITIES TO FOLLOW Performed at [facility], [address] [place], [place] [address]    Report Status PENDING  Incomplete  MRSA PCR Screening     Status: None   Collection Time: 07/10/20  1:44 PM   Specimen: Nasopharyngeal  Result Value Ref Range Status   MRSA by PCR NEGATIVE NEGATIVE Final    Comment:        The GeneXpert MRSA Assay (FDA approved for NASAL specimens only), is one component of a comprehensive MRSA colonization surveillance program. It is not intended to  diagnose MRSA infection nor to guide or monitor treatment for MRSA infections. Performed at [facility], [address] [place], [place] [address]     MEDICATIONS   Current Facility-Administered Medications:  .  acetaminophen (TYLENOL) tablet 650 mg, 650 mg, Per Tube, Q6H PRN, 650 mg at 07/11/20 0449 **OR** acetaminophen (TYLENOL) suppository 650 mg, 650 mg, Rectal, Q6H PRN, Rust-Chester, Britton L, NP .  calamine lotion, , Topical, PRN, Kasa, Kurian, MD, 1 application at 07/13/20 1429 .  chlorhexidine gluconate (MEDLINE KIT) (PERIDEX) 0.12 % solution 15 mL, 15 mL, Mouth Rinse, BID, Kasa, Kurian, MD, 15 mL at 07/14/20 0811 .  Chlorhexidine Gluconate Cloth 2 % PADS 6 each, 6 each, Topical, Daily, Gonfa, Taye T, MD, 6 each at 07/13/20 2200 .  diazepam (VALIUM) tablet 5 mg, 5 mg, Per Tube, Q6H, Kasa, Kurian, MD, 5 mg at 07/14/20 0502 .  docusate (COLACE)   50 MG/5ML liquid 100 mg, 100 mg, Per Tube, BID PRN, Kasa, Kurian, MD, 100 mg at 06/29/20 1054 .  enoxaparin (LOVENOX) injection 72.5 mg, 0.5 mg/kg, Subcutaneous, Q24H, Rust-Chester, Britton L, NP, 72.5 mg at 07/13/20 0936 .  feeding supplement (PROSource TF) liquid 45 mL, 45 mL, Per Tube, 5 X Daily, Kasa, Kurian, MD, 45 mL at 07/14/20 0502 .  feeding supplement (VITAL 1.5 CAL) liquid 1,000 mL, 1,000 mL, Per Tube, Continuous, Kasa, Kurian, MD, Last Rate: 60 mL/hr at 07/14/20 0851, Infusion Verify at 07/14/20 0851 .  folic acid (FOLVITE) tablet 1 mg, 1 mg, Per Tube, Daily, Kasa, Kurian, MD, 1 mg at 07/13/20 0931 .  gabapentin (NEURONTIN) 250 MG/5ML solution 600 mg, 600 mg, Per Tube, Q8H, Aleskerov, Fuad, MD, 600 mg at 07/14/20 0502 .  HYDROmorphone (DILAUDID) 50 mg in sodium chloride 0.9 % 100 mL (0.5 mg/mL) infusion, 0.5-4 mg/hr, Intravenous, Continuous, Rust-Chester, Britton L, NP, Last Rate: 8 mL/hr at 07/14/20 0851, 4 mg/hr at 07/14/20 0851 .  HYDROmorphone (DILAUDID) bolus via infusion 0.5 mg, 0.5 mg, Intravenous, Q30 min PRN,  Rust-Chester, Britton L, NP, 0.5 mg at 07/13/20 1742 .  insulin aspart (novoLOG) injection 0-15 Units, 0-15 Units, Subcutaneous, Q4H, Rust-Chester, Britton L, NP, 2 Units at 07/14/20 0810 .  LORazepam (ATIVAN) tablet 1-4 mg, 1-4 mg, Per Tube, Q1H PRN **OR** LORazepam (ATIVAN) injection 1-4 mg, 1-4 mg, Intravenous, Q1H PRN, Kasa, Kurian, MD, 4 mg at 07/14/20 0647 .  MEDLINE mouth rinse, 15 mL, Mouth Rinse, 10 times per day, Kasa, Kurian, MD, 15 mL at 07/14/20 0507 .  melatonin tablet 5 mg, 5 mg, Per Tube, QHS, Kasa, Kurian, MD, 5 mg at 07/13/20 2110 .  [COMPLETED] minocycline (MINOCIN) capsule 200 mg, 200 mg, Per Tube, Once, 200 mg at 07/12/20 1447 **FOLLOWED BY** minocycline (MINOCIN) capsule 100 mg, 100 mg, Per Tube, BID, Kasa, Kurian, MD, 100 mg at 07/14/20 0811 .  multivitamin with minerals tablet 1 tablet, 1 tablet, Per Tube, Daily, Kasa, Kurian, MD, 1 tablet at 07/13/20 0931 .  nystatin (MYCOSTATIN/NYSTOP) topical powder, , Topical, BID, Rust-Chester, Britton L, NP, Given at 07/13/20 2115 .  oxyCODONE (Oxy IR/ROXICODONE) immediate release tablet 10 mg, 10 mg, Per Tube, Q4H, Chappell, Alex B, RPH, 10 mg at 07/14/20 0811 .  pantoprazole sodium (PROTONIX) 40 mg/20 mL oral suspension 40 mg, 40 mg, Per Tube, Daily, Kasa, Kurian, MD, 40 mg at 07/13/20 0932 .  polyethylene glycol (MIRALAX / GLYCOLAX) packet 17 g, 17 g, Per Tube, Daily PRN, Kasa, Kurian, MD, 17 g at 06/29/20 1054 .  polyvinyl alcohol (LIQUIFILM TEARS) 1.4 % ophthalmic solution 1 drop, 1 drop, Both Eyes, BID, Chappell, Alex B, RPH, 1 drop at 07/13/20 2113 .  thiamine tablet 100 mg, 100 mg, Per Tube, Daily, Chappell, Alex B, RPH, 100 mg at 07/13/20 0934 .  vitamin B-12 (CYANOCOBALAMIN) tablet 1,000 mcg, 1,000 mcg, Per Tube, Daily, Kasa, Kurian, MD, 1,000 mcg at 07/13/20 0931      Indwelling Urinary Catheter continued, requirement due to   Reason to continue Indwelling Urinary Catheter for strict Intake/Output monitoring for  hemodynamic instability   Central Line continued, requirement due to   Reason to continue Central Line Monitoring of central venous pressure or other hemodynamic parameters   Ventilator continued, requirement due to, resp failure    Ventilator Sedation RASS 0 to -2     ASSESSMENT AND PLAN SYNOPSIS  Acute Hypoxic Respiratory failure secondary to acute encephalopathy due to Alcohol withdrawal s/p trach +Acute   Hypoxic Respiratory failure secondary to acute encephalopathy due to Alcohol withdrawal +ELIZABETHKINGIA SPECIES in TA started ABX Pneumonia    Severe ACUTE Hypoxic and Hypercapnic Respiratory Failure -continue Full MV support -continue Bronchodilator Therapy -Wean Fio2 and PEEP as tolerated -will perform SAT/SBT when respiratory parameters are met -VAP/VENT bundle implementation  ACUTE DIASTOLIC CARDIAC FAILURE-  -oxygen as needed -Lasix as tolerated   Morbid obesity, possible OSA.  Will certainly impact respiratory mechanics, ventilator weaning Suspect will need to consider additional PEEP   Renal Stable function -continue Foley Catheter-assess need -Avoid nephrotoxic agents -Follow urine output, BMP -Ensure adequate renal perfusion, optimize oxygenation -Renal dose medications    NEUROLOGY Acute toxic metabolic encephalopathy, need for sedation Goal RASS -2 to -3 Continue Dilaudid infusion wean as tolerated  CARDIAC ICU monitoring  INFECTIOUS DISEASE -continue antibiotics as prescribed -follow up cultures -follow up ID consultation     GI GI PROPHYLAXIS as indicated  NUTRITIONAL STATUS Nutrition Status: Nutrition Problem: Inadequate oral intake Etiology: inability to eat Signs/Symptoms: NPO status Interventions: Tube feeding,Prostat,MVI   DIET-->TF's as tolerated Constipation protocol as indicated  ENDO - will use ICU hypoglycemic\Hyperglycemia protocol if indicated    ELECTROLYTES -follow labs as  needed -replace as needed -pharmacy consultation and following   DVT/GI PRX ordered and assessed TRANSFUSIONS AS NEEDED MONITOR FSBS I Assessed the need for Labs I Assessed the need for Foley I Assessed the need for Central Venous Line Family Discussion when available I Assessed the need for Mobilization I made an Assessment of medications to be adjusted accordingly Safety Risk assessment completed   Critical Care Time devoted to patient care services described in this note is 35 minutes.   Overall, patient is critically ill, prognosis is guarded.  Patient with Multiorgan failure and at high risk for cardiac arrest and death.   Rosine Door, MD  07/28/20 9:10 AM [facility]

## 2020-07-14 NOTE — Progress Notes (Signed)
PHARMACY CONSULT NOTE  Pharmacy Consult for Electrolyte Monitoring and Replacement   Recent Labs: Potassium (mmol/L)  Date Value  07/14/2020 4.7  02/12/2013 3.9   Magnesium (mg/dL)  Date Value  41/42/3953 2.2   Calcium (mg/dL)  Date Value  20/23/3435 9.2   Calcium, Total (mg/dL)  Date Value  68/61/6837 8.7   Albumin (g/dL)  Date Value  29/09/1113 2.3 (L)  02/12/2013 3.6   Phosphorus (mg/dL)  Date Value  52/03/222 5.0 (H)   Sodium (mmol/L)  Date Value  07/14/2020 143  02/12/2013 139   Assessment: 39 year old male here with encephalopathy s/t alcohol withdrawal. Patient has been intubated and sedated since 11/16. 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 Pharmacy to manage electrolytes.  Tube feeds: Vital AF 1.2 Cal at 60 mL/hr  + PROSource TF 90 mL 5 times daily per tube  Free water: 200 mL every 4 hours (d/c 12/2)  Diuretics: IV Lasix 40 mg daily (started 12/1, d/c 12/9)  Bowel regimen: Lactulose 20 g BID (d/c 12/2)   Goal of Therapy:  Electrolytes WNL  Plan:   No electrolyte replacement warranted today  Sodium 146 >> 143, free water flushes on hold since 12/2. Continue to monitor  Follow-up with AM labs tomorrow  Tressie Ellis 07/14/2020 8:33 AM

## 2020-07-15 ENCOUNTER — Inpatient Hospital Stay: Payer: Medicaid Other

## 2020-07-15 DIAGNOSIS — J9601 Acute respiratory failure with hypoxia: Secondary | ICD-10-CM

## 2020-07-15 LAB — CBC WITH DIFFERENTIAL/PLATELET
Abs Immature Granulocytes: 0.07 10*3/uL (ref 0.00–0.07)
Basophils Absolute: 0 10*3/uL (ref 0.0–0.1)
Basophils Relative: 1 %
Eosinophils Absolute: 0.9 10*3/uL — ABNORMAL HIGH (ref 0.0–0.5)
Eosinophils Relative: 16 %
HCT: 28.8 % — ABNORMAL LOW (ref 39.0–52.0)
Hemoglobin: 9.3 g/dL — ABNORMAL LOW (ref 13.0–17.0)
Immature Granulocytes: 1 %
Lymphocytes Relative: 25 %
Lymphs Abs: 1.4 10*3/uL (ref 0.7–4.0)
MCH: 34.1 pg — ABNORMAL HIGH (ref 26.0–34.0)
MCHC: 32.3 g/dL (ref 30.0–36.0)
MCV: 105.5 fL — ABNORMAL HIGH (ref 80.0–100.0)
Monocytes Absolute: 0.7 10*3/uL (ref 0.1–1.0)
Monocytes Relative: 13 %
Neutro Abs: 2.6 10*3/uL (ref 1.7–7.7)
Neutrophils Relative %: 44 %
Platelets: 240 10*3/uL (ref 150–400)
RBC: 2.73 MIL/uL — ABNORMAL LOW (ref 4.22–5.81)
RDW: 13.8 % (ref 11.5–15.5)
WBC: 5.7 10*3/uL (ref 4.0–10.5)
nRBC: 0 % (ref 0.0–0.2)

## 2020-07-15 LAB — BASIC METABOLIC PANEL
Anion gap: 12 (ref 5–15)
BUN: 33 mg/dL — ABNORMAL HIGH (ref 6–20)
CO2: 29 mmol/L (ref 22–32)
Calcium: 9.2 mg/dL (ref 8.9–10.3)
Chloride: 103 mmol/L (ref 98–111)
Creatinine, Ser: 1.03 mg/dL (ref 0.61–1.24)
GFR, Estimated: >60 mL/min (ref >60)
Glucose, Bld: 133 mg/dL — ABNORMAL HIGH (ref 70–99)
Potassium: 3.9 mmol/L (ref 3.5–5.1)
Sodium: 144 mmol/L (ref 135–145)

## 2020-07-15 LAB — GLUCOSE, CAPILLARY
Glucose-Capillary: 132 mg/dL — ABNORMAL HIGH (ref 70–99)
Glucose-Capillary: 107 mg/dL — ABNORMAL HIGH (ref 70–99)
Glucose-Capillary: 120 mg/dL — ABNORMAL HIGH (ref 70–99)
Glucose-Capillary: 123 mg/dL — ABNORMAL HIGH (ref 70–99)
Glucose-Capillary: 121 mg/dL — ABNORMAL HIGH (ref 70–99)

## 2020-07-15 LAB — MAGNESIUM: Magnesium: 2.3 mg/dL (ref 1.7–2.4)

## 2020-07-15 LAB — PHOSPHORUS: Phosphorus: 4.8 mg/dL — ABNORMAL HIGH (ref 2.5–4.6)

## 2020-07-15 MED ORDER — LACTATED RINGERS IV SOLN: INTRAVENOUS | Status: DC

## 2020-07-15 NOTE — Progress Notes (Signed)
PHARMACY CONSULT NOTE  Pharmacy Consult for Electrolyte Monitoring and Replacement   Recent Labs: Potassium (mmol/L)  Date Value  07/15/2020 3.9  02/12/2013 3.9   Magnesium (mg/dL)  Date Value  44/08/270 2.3   Calcium (mg/dL)  Date Value  53/66/4403 9.2   Calcium, Total (mg/dL)  Date Value  47/42/5956 8.7   Albumin (g/dL)  Date Value  38/75/6433 2.3 (L)  02/12/2013 3.6   Phosphorus (mg/dL)  Date Value  29/51/8841 4.8 (H)   Sodium (mmol/L)  Date Value  07/15/2020 144  02/12/2013 139   Assessment: 39 year old male here with encephalopathy s/t alcohol withdrawal. Patient has been intubated and sedated since 11/16. 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 Pharmacy to manage electrolytes.  Tube feeds: Vital 1.5 Cal at 60 mL/hr  + PROSource TF 45 mL 5 times daily per tube  Free water: 200 mL every 4 hours (d/c 12/2)  Diuretics: IV Lasix 40 mg daily (started 12/1, d/c 12/9)  Bowel regimen: Lactulose 20 g BID (d/c 12/2)   Goal of Therapy:  Electrolytes WNL  Plan:   No electrolyte replacement warranted today  Sodium 146 >143>144, free water flushes on hold since 12/2. Continue to monitor  Follow-up with AM labs tomorrow  Marty Heck 07/15/2020 11:07 AM

## 2020-07-15 NOTE — Progress Notes (Addendum)
CRITICAL CARE NOTE  CC  follow up respiratory failure/vent/TC  SUBJECTIVE Patient remains critically ill Prognosis is guarded He was given a trial of sedation weaning yesterday however he became extremely restless agitated and was trying to climb out of the bed therefore the trial was aborted.  He never really came off the sedation completely.     SIGNIFICANT EVENTS 06/20/20 Admit to ICU, intubated 06/23/20-patient unable to have SBT today due to sedation. Despite decreasing versed to 7 and completely stopping Propofol patient is only partially arousable. Will continue to wean sedation to RASS-0 with plan for SBT. 06/24/20- patient is being weaned off sedation as possible.  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  12/1- patient became severely aggitated, tachypneic, tachycardic and with heavy endotracheal secretions. He is febrile 12/2-Patient with extremely high tolerance to sedation have been unable to bring down dosing. Reviewed care plan with mother of patient [name] - plan for trache - ENT consult placed. 07/07/2020:Tracheostomy planned for 12/6, remains encephalopathic, does not follow commands, agitation when sedation is lightened. 07/08/2020:Moves all fours, still on vent, less agitated 12/6TRACH placed by ENT 12/8wean sedation, assess for PS trials 12/8 ELIZABETHKINGIA SPECIES in TA started ABX                              12/9 no sig events                                12/10 severe agitation with sedation weaning   BP (!) 95/58   Pulse 72   Temp 98.9 F (37.2 C) (Oral)   Resp (!) 21    Ht 5' 10"  (1.778 m)   Wt (!) 143 kg   SpO2 94%   BMI 45.23 kg/m    REVIEW OF SYSTEMS  PATIENT IS UNABLE TO PROVIDE COMPLETE REVIEW OF SYSTEMS DUE TO SEVERE CRITICAL ILLNESS   PHYSICAL EXAMINATION:  GENERAL:critically ill appearing, no resp distress HEAD: Normocephalic, atraumatic.  EYES: Pupils equal, round, reactive to light.  No scleral icterus.  MOUTH: Moist mucosal membrane. NECK: Supple. No thyromegaly. No nodules. No JVD.  PULMONARY: No rhonchi or wheezing CARDIOVASCULAR: S1 and S2. Regular rate and rhythm. No murmurs, rubs, or gallops.  GASTROINTESTINAL: Soft, nontender, -distended. No masses. Positive bowel sounds. No hepatosplenomegaly.  MUSCULOSKELETAL: No swelling, clubbing, or edema.  NEUROLOGIC: Sedated on vent through tracheostomy  SKIN:intact,warm,dry  INTAKE/OUTPUT  Intake/Output Summary (Last 24 hours) at 07/15/2020 0802 Last data filed at 07/15/2020 0540 Gross per 24 hour  Intake 1755.44 ml  Output 1475 ml  Net 280.44 ml    LABS  CBC Recent Labs  Lab 07/13/20 0632 07/14/20 0644 07/15/20 0650  WBC 7.2 7.1 5.7  HGB 9.1* 9.4* 9.3*  HCT 28.3* 29.6* 28.8*  PLT 294 229 240   Coag's No results for input(s): APTT, INR in the last 168 hours. BMET Recent Labs  Lab 07/13/20 0632 07/14/20 0644 07/15/20 0540  NA 146* 143 144  K 4.0 4.7 3.9  CL 103 103 103  CO2 32 28 29  BUN 37* 36*  33*  CREATININE 1.27* 1.14 1.03  GLUCOSE 160* 143* 133*   Electrolytes Recent Labs  Lab 07/12/20 0641 07/13/20 0632 07/14/20 0644 07/15/20 0540  CALCIUM 9.4 9.1 9.2 9.2  MG 2.2 2.2  --  2.3  PHOS 5.1* 5.0*  --  4.8*   Sepsis Markers Recent Labs  Lab 07/10/20 0627  PROCALCITON 0.14   ABG No results for input(s): PHART, PCO2ART, PO2ART in the last 168 hours. Liver Enzymes No results for input(s): AST, ALT, ALKPHOS, BILITOT, ALBUMIN in the last 168 hours. Cardiac Enzymes No results for input(s): TROPONINI, PROBNP in the last 168 hours. Glucose Recent  Labs  Lab 07/13/20 1925 07/13/20 2322 07/14/20 1226 07/14/20 1949 07/15/20 0320 07/15/20 0732  GLUCAP 116* 161* 113* 133* 132* 107*     Recent Results (from the past 240 hour(s))  Culture, respiratory (non-expectorated)     Status: None   Collection Time: 07/05/20  2:12 PM   Specimen: Tracheal Aspirate; Respiratory  Result Value Ref Range Status   Specimen Description   Final    TRACHEAL ASPIRATE Performed at [facility], [address], [place], [place] [address]    Special Requests   Final    Normal Performed at [facility], [address] [place], [place] [address]    Gram Stain   Final    ABUNDANT WBC PRESENT, PREDOMINANTLY PMN RARE YEAST    Culture   Final    Normal respiratory flora-no Staph aureus or Pseudomonas seen Performed at [facility] [address] [place], [place] [address]    Report Status 07/07/2020 FINAL  Final  Culture, respiratory (non-expectorated)     Status: None (Preliminary result)   Collection Time: 07/10/20 10:53 AM   Specimen: Tracheal Aspirate; Respiratory  Result Value Ref Range Status   Specimen Description   Final    TRACHEAL ASPIRATE Performed at [facility], [address] [place], [place] [address]    Special Requests   Final    NONE Performed at [facility], [address] [place], [place] [address]    Gram Stain   Final    FEW WBC PRESENT, PREDOMINANTLY PMN RARE GRAM NEGATIVE RODS    Culture   Final    MODERATE ELIZABETHKINGIA MENINGOSEPTICA SUSCEPTIBILITIES TO FOLLOW Performed at [facility], [place] [address], [place], [place] [address]    Report Status PENDING  Incomplete  MRSA PCR Screening     Status: None   Collection Time: 07/10/20  1:44 PM   Specimen: Nasopharyngeal  Result Value Ref Range Status   MRSA by PCR NEGATIVE NEGATIVE Final    Comment:        The GeneXpert MRSA Assay (FDA approved for NASAL specimens only), is one component of  a comprehensive MRSA colonization surveillance program. It is not intended to diagnose MRSA infection nor to guide or monitor treatment for MRSA infections. Performed at [facility], [address] [place], [place] [address]     MEDICATIONS   Current Facility-Administered Medications:  .  acetaminophen (TYLENOL) tablet 650 mg, 650 mg, Per Tube, Q6H PRN, 650 mg at 07/11/20 0449 **OR** acetaminophen (TYLENOL) suppository 650 mg, 650 mg, Rectal, Q6H PRN, Rust-Chester, Huel Cote, NP .  calamine lotion, , Topical, PRN, Flora Lipps, MD, 1 application at 40/97/35 1429 .  chlorhexidine gluconate (MEDLINE KIT) (PERIDEX) 0.12 % solution 15 mL, 15 mL, Mouth Rinse, BID, Kasa, Kurian, MD, 15 mL at 07/14/20 1955 .  Chlorhexidine Gluconate Cloth  2 % PADS 6 each, 6 each, Topical, Daily, Mercy Riding, MD, 6 each at 07/14/20 2152 .  diazepam (VALIUM) tablet 5 mg, 5 mg, Per Tube, Q6H, Flora Lipps, MD, 5 mg at 07/15/20 0533 .  docusate (COLACE) 50 MG/5ML liquid 100 mg, 100 mg, Per Tube, BID PRN, Flora Lipps, MD, 100 mg at 06/29/20 1054 .  enoxaparin (LOVENOX) injection 72.5 mg, 0.5 mg/kg, Subcutaneous, Q24H, Rust-Chester, Britton L, NP, 72.5 mg at 07/14/20 0954 .  feeding supplement (PROSource TF) liquid 45 mL, 45 mL, Per Tube, 5 X Daily, Kasa, Kurian, MD, 45 mL at 07/15/20 0533 .  feeding supplement (VITAL 1.5 CAL) liquid 1,000 mL, 1,000 mL, Per Tube, Continuous, Kasa, Kurian, MD, Last Rate: 60 mL/hr at 07/15/20 0425, Infusion Verify at 07/15/20 0425 .  folic acid (FOLVITE) tablet 1 mg, 1 mg, Per Tube, Daily, Flora Lipps, MD, 1 mg at 07/14/20 0953 .  gabapentin (NEURONTIN) 250 MG/5ML solution 600 mg, 600 mg, Per Tube, Q8H, Aleskerov, Fuad, MD, 600 mg at 07/15/20 0534 .  HYDROmorphone (DILAUDID) 50 mg in sodium chloride 0.9 % 100 mL (0.5 mg/mL) infusion, 0.5-4 mg/hr, Intravenous, Continuous, Rust-Chester, Britton L, NP, Last Rate: 8 mL/hr at 07/15/20 0425, 4 mg/hr at 07/15/20 0425 .   HYDROmorphone (DILAUDID) bolus via infusion 0.5 mg, 0.5 mg, Intravenous, Q30 min PRN, Rust-Chester, Britton L, NP, 0.5 mg at 07/13/20 1742 .  insulin aspart (novoLOG) injection 0-15 Units, 0-15 Units, Subcutaneous, Q4H, Rust-Chester, Britton L, NP, 2 Units at 07/15/20 0341 .  LORazepam (ATIVAN) tablet 1-4 mg, 1-4 mg, Per Tube, Q1H PRN **OR** LORazepam (ATIVAN) injection 1-4 mg, 1-4 mg, Intravenous, Q1H PRN, Flora Lipps, MD, 4 mg at 07/15/20 0340 .  MEDLINE mouth rinse, 15 mL, Mouth Rinse, 10 times per day, Flora Lipps, MD, 15 mL at 07/15/20 0535 .  melatonin tablet 5 mg, 5 mg, Per Tube, QHS, Flora Lipps, MD, 5 mg at 07/14/20 2151 .  [COMPLETED] minocycline (MINOCIN) capsule 200 mg, 200 mg, Per Tube, Once, 200 mg at 07/12/20 1447 **FOLLOWED BY** minocycline (MINOCIN) capsule 100 mg, 100 mg, Per Tube, BID, Flora Lipps, MD, 100 mg at 07/14/20 1954 .  multivitamin with minerals tablet 1 tablet, 1 tablet, Per Tube, Daily, Flora Lipps, MD, 1 tablet at 07/14/20 0953 .  nystatin (MYCOSTATIN/NYSTOP) topical powder, , Topical, BID, Rust-Chester, Huel Cote, NP, Given at 07/14/20 2151 .  OLANZapine (ZYPREXA) injection 10 mg, 10 mg, Intramuscular, Once PRN, Rosine Door, MD, 10 mg at 07/14/20 1445 .  oxyCODONE (Oxy IR/ROXICODONE) immediate release tablet 10 mg, 10 mg, Per Tube, Q4H, Benita Gutter, RPH, 10 mg at 07/15/20 0340 .  pantoprazole sodium (PROTONIX) 40 mg/20 mL oral suspension 40 mg, 40 mg, Per Tube, Daily, Flora Lipps, MD, 40 mg at 07/14/20 0953 .  polyethylene glycol (MIRALAX / GLYCOLAX) packet 17 g, 17 g, Per Tube, Daily PRN, Flora Lipps, MD, 17 g at 06/29/20 1054 .  polyvinyl alcohol (LIQUIFILM TEARS) 1.4 % ophthalmic solution 1 drop, 1 drop, Both Eyes, BID, Benita Gutter, RPH, 1 drop at 07/14/20 2151 .  thiamine tablet 100 mg, 100 mg, Per Tube, Daily, Benita Gutter, RPH, 100 mg at 07/14/20 0954 .  vitamin B-12 (CYANOCOBALAMIN) tablet 1,000 mcg, 1,000 mcg, Per Tube, Daily, Kasa,  Kurian, MD, 1,000 mcg at 07/14/20 7035      Indwelling Urinary Catheter continued, requirement due to   Reason to continue Indwelling Urinary Catheter for strict Intake/Output monitoring for hemodynamic instability   Central Line  continued, requirement due to   Reason to continue [employer] Monitoring of central venous pressure or other hemodynamic parameters   Ventilator continued, requirement due to, resp failure    Ventilator Sedation RASS 0 to -2     ASSESSMENT AND PLAN SYNOPSIS   Acute Hypoxic Respiratory failure secondary to acute encephalopathy due to Alcohol withdrawals/p trach  +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  Will certainly impact respiratory mechanics, ventilator weaning Suspect will need to consider additional PEEP   AKI Renal function stable -continue Foley Catheter-assess need -Avoid nephrotoxic agents -Follow urine output, BMP -Ensure adequate renal perfusion, optimize oxygenation -Renal dose medications    NEUROLOGY Acute toxic metabolic encephalopathy, need for sedation Goal RASS -2 to -3 Continue Dilaudid infusion wean as tolerated  CARDIAC ICU monitoring  INFECTIOUS DISEASE -continue antibiotics as prescribed -follow up cultures -follow up ID consultation     GI GI PROPHYLAXIS as indicated  NUTRITIONAL STATUS Nutrition Status: Nutrition Problem: Inadequate oral intake Etiology: inability to eat Signs/Symptoms: NPO status Interventions: Tube feeding,Prostat,MVI   DIET-->TF's as tolerated Constipation protocol as indicated  ENDO - will use ICU hypoglycemic\Hyperglycemia protocol if  indicated    ELECTROLYTES -follow labs as needed -replace as needed -pharmacy consultation and following   DVT/GI PRX ordered and assessed TRANSFUSIONS AS NEEDED MONITOR FSBS I Assessed the need for Labs I Assessed the need for Foley I Assessed the need for Central Venous Line Family Discussion when available I Assessed the need for Mobilization I made an Assessment of medications to be adjusted accordingly Safety Risk assessment completed   Critical Care Time devoted to patient care services described in this note is 31 minutes.   Overall, patient is critically ill, prognosis is guarded.  Patient with Multiorgan failure and at high risk for cardiac arrest and death.   Rosine Door, MD  07/18/2020 8:02 AM [facility]

## 2020-07-15 NOTE — Plan of Care (Signed)
Pt has tolerated being off continuous sedation infusion. Remains on dilaudid infusion. Attempted to wean dilaudid today but pt very restless, required bolus from pump, IV ativan and IM zyprex to maintain safety. Bladder scanned this evening to ensure agitation unrelated to urinary retention (volume less than ). Pt localizes very appropriately and makes eye contact but does not follow commands. Vital signs in range throughout day. Dad visited this evening for brief time as today is patient's birthday. Full update provided at bedside.   Problem: Clinical Measurements: Goal: Ability to maintain clinical measurements within normal limits will improve Outcome: Progressing Goal: Will remain free from infection Outcome: Progressing Goal: Diagnostic test results will improve Outcome: Progressing Goal: Respiratory complications will improve Outcome: Progressing Goal: Cardiovascular complication will be avoided Outcome: Progressing   Problem: Nutrition: Goal: Adequate nutrition will be maintained Outcome: Progressing   Problem: Elimination: Goal: Will not experience complications related to bowel motility Outcome: Progressing Goal: Will not experience complications related to urinary retention Outcome: Progressing   Problem: Safety: Goal: Ability to remain free from injury will improve Outcome: Progressing   Problem: Skin Integrity: Goal: Risk for impaired skin integrity will decrease Outcome: Progressing   Problem: Education: Goal: Knowledge of General Education information will improve Description: Including pain rating scale, medication(s)/side effects and non-pharmacologic comfort measures Outcome: Not Progressing   Problem: Health Behavior/Discharge Planning: Goal: Ability to manage health-related needs will improve Outcome: Not Progressing   Problem: Activity: Goal: Risk for activity intolerance will decrease Outcome: Not Progressing   Problem: Coping: Goal: Level of  anxiety will decrease Outcome: Not Progressing   Problem: Pain Managment: Goal: General experience of comfort will improve Outcome: Not Progressing

## 2020-07-16 LAB — GLUCOSE, CAPILLARY
Glucose-Capillary: 131 mg/dL — ABNORMAL HIGH (ref 70–99)
Glucose-Capillary: 125 mg/dL — ABNORMAL HIGH (ref 70–99)
Glucose-Capillary: 126 mg/dL — ABNORMAL HIGH (ref 70–99)
Glucose-Capillary: 120 mg/dL — ABNORMAL HIGH (ref 70–99)
Glucose-Capillary: 122 mg/dL — ABNORMAL HIGH (ref 70–99)
Glucose-Capillary: 125 mg/dL — ABNORMAL HIGH (ref 70–99)
Glucose-Capillary: 134 mg/dL — ABNORMAL HIGH (ref 70–99)
Glucose-Capillary: 135 mg/dL — ABNORMAL HIGH (ref 70–99)
Glucose-Capillary: 137 mg/dL — ABNORMAL HIGH (ref 70–99)
Glucose-Capillary: 141 mg/dL — ABNORMAL HIGH (ref 70–99)
Glucose-Capillary: 145 mg/dL — ABNORMAL HIGH (ref 70–99)

## 2020-07-16 LAB — CBC WITH DIFFERENTIAL/PLATELET
Abs Immature Granulocytes: 0.1 10*3/uL — ABNORMAL HIGH (ref 0.00–0.07)
Basophils Absolute: 0.1 10*3/uL (ref 0.0–0.1)
Basophils Relative: 1 %
Eosinophils Absolute: 1.1 10*3/uL — ABNORMAL HIGH (ref 0.0–0.5)
Eosinophils Relative: 15 %
HCT: 30.6 % — ABNORMAL LOW (ref 39.0–52.0)
Hemoglobin: 10 g/dL — ABNORMAL LOW (ref 13.0–17.0)
Immature Granulocytes: 1 %
Lymphocytes Relative: 29 %
Lymphs Abs: 2.1 10*3/uL (ref 0.7–4.0)
MCH: 34.1 pg — ABNORMAL HIGH (ref 26.0–34.0)
MCHC: 32.7 g/dL (ref 30.0–36.0)
MCV: 104.4 fL — ABNORMAL HIGH (ref 80.0–100.0)
Monocytes Absolute: 0.9 10*3/uL (ref 0.1–1.0)
Monocytes Relative: 13 %
Neutro Abs: 2.9 10*3/uL (ref 1.7–7.7)
Neutrophils Relative %: 41 %
Platelets: 273 10*3/uL (ref 150–400)
RBC: 2.93 MIL/uL — ABNORMAL LOW (ref 4.22–5.81)
RDW: 13.6 % (ref 11.5–15.5)
WBC: 7.1 10*3/uL (ref 4.0–10.5)
nRBC: 0 % (ref 0.0–0.2)

## 2020-07-16 LAB — PHOSPHORUS: Phosphorus: 5.7 mg/dL — ABNORMAL HIGH (ref 2.5–4.6)

## 2020-07-16 LAB — BASIC METABOLIC PANEL
Anion gap: 9 (ref 5–15)
BUN: 32 mg/dL — ABNORMAL HIGH (ref 6–20)
CO2: 32 mmol/L (ref 22–32)
Calcium: 9.1 mg/dL (ref 8.9–10.3)
Chloride: 102 mmol/L (ref 98–111)
Creatinine, Ser: 1.18 mg/dL (ref 0.61–1.24)
GFR, Estimated: >60 mL/min (ref >60)
Glucose, Bld: 143 mg/dL — ABNORMAL HIGH (ref 70–99)
Potassium: 4.5 mmol/L (ref 3.5–5.1)
Sodium: 143 mmol/L (ref 135–145)

## 2020-07-16 LAB — MAGNESIUM: Magnesium: 2.2 mg/dL (ref 1.7–2.4)

## 2020-07-16 MED ORDER — ALBUTEROL SULFATE (2.5 MG/3ML) 0.083% IN NEBU: 2.5000 mg | INHALATION_SOLUTION | RESPIRATORY_TRACT | Status: DC | PRN

## 2020-07-16 MED ORDER — LORAZEPAM 2 MG/ML IJ SOLN
1.0000 mg | INTRAMUSCULAR | Status: DC | PRN
  Administered 2020-07-16 – 2020-07-18 (×12): 4 mg via INTRAVENOUS
  Administered 2020-07-20: 2 mg via INTRAVENOUS
  Administered 2020-07-20 (×2): 4 mg via INTRAVENOUS
  Administered 2020-07-21 (×2): 2 mg via INTRAVENOUS
  Administered 2020-07-21: 4 mg via INTRAVENOUS
  Administered 2020-07-21: 2 mg via INTRAVENOUS
  Administered 2020-07-22: 4 mg via INTRAVENOUS
  Administered 2020-07-22 – 2020-07-23 (×2): 2 mg via INTRAVENOUS
  Administered 2020-07-23 – 2020-07-26 (×12): 4 mg via INTRAVENOUS
  Administered 2020-07-28: 2 mg via INTRAVENOUS
  Administered 2020-07-29: 4 mg via INTRAVENOUS
  Administered 2020-07-29: 2 mg via INTRAVENOUS
  Filled 2020-07-16 (×6): qty 2
  Filled 2020-07-16 (×3): qty 1
  Filled 2020-07-16 (×5): qty 2
  Filled 2020-07-16: qty 1
  Filled 2020-07-16 (×9): qty 2
  Filled 2020-07-16: qty 1
  Filled 2020-07-16 (×4): qty 2
  Filled 2020-07-16: qty 1
  Filled 2020-07-16 (×2): qty 2
  Filled 2020-07-16: qty 1
  Filled 2020-07-16 (×2): qty 2
  Filled 2020-07-16: qty 1
  Filled 2020-07-16 (×3): qty 2

## 2020-07-16 MED ORDER — LORAZEPAM 2 MG/ML IJ SOLN
INTRAMUSCULAR | Status: AC
  Administered 2020-07-16: 4 mg via INTRAVENOUS
  Filled 2020-07-16: qty 2

## 2020-07-16 MED ORDER — DEXMEDETOMIDINE HCL IN NACL 400 MCG/100ML IV SOLN
0.4000 ug/kg/h | INTRAVENOUS | Status: DC
  Administered 2020-07-16: 0.4 ug/kg/h via INTRAVENOUS
  Administered 2020-07-16: 0.3 ug/kg/h via INTRAVENOUS
  Administered 2020-07-17: 0.6 ug/kg/h via INTRAVENOUS
  Administered 2020-07-17: 1.2 ug/kg/h via INTRAVENOUS
  Administered 2020-07-17: 0.4 ug/kg/h via INTRAVENOUS
  Administered 2020-07-17: 0.6 ug/kg/h via INTRAVENOUS
  Administered 2020-07-17 – 2020-07-18 (×5): 1.2 ug/kg/h via INTRAVENOUS
  Administered 2020-07-18: 1 ug/kg/h via INTRAVENOUS
  Administered 2020-07-18 (×2): 1.2 ug/kg/h via INTRAVENOUS
  Filled 2020-07-16 (×13): qty 100

## 2020-07-16 NOTE — Progress Notes (Signed)
Patient is still on continuous IV dilaudid, has remained calm for most of the shift, however around 0300 he started to become agitated and restless, 4 mg of Ativan prn was given along with a 0.5 mg dilaudid bolus x2. Unable to follow commands, but has purposeful movement of all extremities with full strength. He has rested since then and vitals have remained stable throughout the entire shift. Close friend/partner Santina Evans calls and gets updated every shift, will continue to monitor.

## 2020-07-16 NOTE — Progress Notes (Signed)
CRITICAL CARE NOTE  CC  follow up respiratory failure Status post tracheostomy  SUBJECTIVE Mains restless and agitated.  Trials of weaning sedatives have failed repeatedly due to his severe agitation as he tries to climb out of the bed's and is difficult to control. Patient remains critically ill Prognosis is guarded     SIGNIFICANT EVENTS Ongoing delirium agitation and encephalopathy    BP 96/62   Pulse 69   Temp 99.7 F (37.6 C) (Oral)   Resp (!) 29   Ht _0  (1.778 m)   Wt (!) 143.1 kg   SpO2 95%   BMI 45.27 kg/m    REVIEW OF SYSTEMS  PATIENT IS UNABLE TO PROVIDE COMPLETE REVIEW OF SYSTEMS DUE TO SEVERE CRITICAL ILLNESS   PHYSICAL EXAMINATION:  GENERAL:critically ill appearing, no resp distress HEAD: Normocephalic, atraumatic.  EYES: Pupils equal, round, reactive to light.  No scleral icterus.  MOUTH: Moist mucosal membrane. NECK: Supple. No thyromegaly. No nodules. No JVD.  PULMONARY: Wheezing or rhonchi CARDIOVASCULAR: S1 and S2. Regular rate and rhythm. No murmurs, rubs, or gallops.  GASTROINTESTINAL: Soft, nontender, -distended. No masses. Positive bowel sounds. No hepatosplenomegaly.  MUSCULOSKELETAL: No swelling, clubbing, or edema.  NEUROLOGIC: Intubated sedated but moves all extremities occasionally opens eyes to name calling. SKIN:intact,warm,dry  INTAKE/OUTPUT  Intake/Output Summary (Last 24 hours) at 07/16/2020 1445 Last data filed at 07/16/2020 1406 Gross per 24 hour  Intake 1855.69 ml  Output 1525 ml  Net 330.69 ml    LABS  CBC Recent Labs  Lab 07/14/20 0644 07/15/20 0650 07/16/20 0742  WBC 7.1 5.7 7.1  HGB 9.4* 9.3* 10.0*  HCT 29.6* 28.8* 30.6*  PLT 229 240 273   Coag's No results for input(s): APTT, INR in the last 168 hours. BMET Recent Labs  Lab 07/14/20 0644 07/15/20 0540 07/16/20 0742  NA 143 144 143  K 4.7 3.9 4.5  CL 103 103 102  CO2 28 29 32  BUN 36* 33* 32*  CREATININE 1.14 1.03 1.18  GLUCOSE 143* 133*  143*   Electrolytes Recent Labs  Lab 07/13/20 0632 07/14/20 0644 07/15/20 0540 07/16/20 0742  CALCIUM 9.1 9.2 9.2 9.1  MG 2.2  --  2.3 2.2  PHOS 5.0*  --  4.8* 5.7*   Sepsis Markers Recent Labs  Lab 07/10/20 0627  PROCALCITON 0.14   ABG No results for input(s): PHART, PCO2ART, PO2ART in the last 168 hours. Liver Enzymes No results for input(s): AST, ALT, ALKPHOS, BILITOT, ALBUMIN in the last 168 hours. Cardiac Enzymes No results for input(s): TROPONINI, PROBNP in the last 168 hours. Glucose Recent Labs  Lab 07/15/20 0732 07/15/20 1140 07/15/20 1623 07/15/20 2303 07/16/20 0310 07/16/20 0751  GLUCAP 107* 120* 123* 121* 131* 125*   CXR 12/11 no sig abnormalities  Recent Results (from the past 240 hour(s))  Culture, respiratory (non-expectorated)     Status: None (Preliminary result)   Collection Time: 07/10/20 10:53 AM   Specimen: Tracheal Aspirate; Respiratory  Result Value Ref Range Status   Specimen Description   Final    TRACHEAL ASPIRATE Performed at [facility], [address] [place], [place] [address]    Special Requests   Final    NONE Performed at [facility], [address] [place], [place] [address]    Gram Stain   Final    FEW WBC PRESENT, PREDOMINANTLY PMN RARE GRAM NEGATIVE RODS    Culture   Final    MODERATE ELIZABETHKINGIA MENINGOSEPTICA SUSCEPTIBILITIES TO FOLLOW Performed  at [facility], [place] [address] [place], [place] [address]    Report Status PENDING  Incomplete  MRSA PCR Screening     Status: None   Collection Time: 07/10/20  1:44 PM   Specimen: Nasopharyngeal  Result Value Ref Range Status   MRSA by PCR NEGATIVE NEGATIVE Final    Comment:        The GeneXpert MRSA Assay (FDA approved for NASAL specimens only), is one component of a comprehensive MRSA colonization surveillance program. It is not intended to diagnose MRSA infection nor to guide or monitor treatment for MRSA  infections. Performed at [facility], [address] [place], [place] [address]     MEDICATIONS   Current Facility-Administered Medications:  .  acetaminophen (TYLENOL) tablet 650 mg, 650 mg, Per Tube, Q6H PRN, 650 mg at 07/11/20 0449 **OR** acetaminophen (TYLENOL) suppository 650 mg, 650 mg, Rectal, Q6H PRN, Rust-Chester, Britton L, NP .  albuterol (PROVENTIL) (2.5 MG/3ML) 0.083% nebulizer solution 2.5 mg, 2.5 mg, Nebulization, Q4H PRN, Rosine Door, MD .  Henrietta Hoover lotion, , Topical, PRN, Flora Lipps, MD, 1 application at 01/75/10 1429 .  chlorhexidine gluconate (MEDLINE KIT) (PERIDEX) 0.12 % solution 15 mL, 15 mL, Mouth Rinse, BID, Kasa, Kurian, MD, 15 mL at 07/16/20 0817 .  Chlorhexidine Gluconate Cloth 2 % PADS 6 each, 6 each, Topical, Daily, Mercy Riding, MD, 6 each at 07/15/20 2137 .  dexmedetomidine (PRECEDEX) 400 MCG/100ML (4 mcg/mL) infusion, 0.4-1.2 mcg/kg/hr, Intravenous, Titrated, Rosine Door, MD, Last Rate: 7.16 mL/hr at 07/16/20 1406, 0.2 mcg/kg/hr at 07/16/20 1406 .  diazepam (VALIUM) tablet 5 mg, 5 mg, Per Tube, Q6H, Flora Lipps, MD, 5 mg at 07/16/20 1240 .  docusate (COLACE) 50 MG/5ML liquid 100 mg, 100 mg, Per Tube, BID PRN, Flora Lipps, MD, 100 mg at 07/16/20 0816 .  enoxaparin (LOVENOX) injection 72.5 mg, 0.5 mg/kg, Subcutaneous, Q24H, Rust-Chester, Britton L, NP, 72.5 mg at 07/16/20 1030 .  feeding supplement (PROSource TF) liquid 45 mL, 45 mL, Per Tube, 5 X Daily, Kasa, Kurian, MD, 45 mL at 07/16/20 1319 .  feeding supplement (VITAL 1.5 CAL) liquid 1,000 mL, 1,000 mL, Per Tube, Continuous, Kasa, Kurian, MD, Last Rate: 60 mL/hr at 07/16/20 1406, Infusion Verify at 07/16/20 1406 .  folic acid (FOLVITE) tablet 1 mg, 1 mg, Per Tube, Daily, Kasa, Kurian, MD, 1 mg at 07/16/20 1029 .  gabapentin (NEURONTIN) 250 MG/5ML solution 600 mg, 600 mg, Per Tube, Q8H, Aleskerov, Fuad, MD, 600 mg at 07/16/20 0533 .  HYDROmorphone (DILAUDID) 50 mg in sodium chloride 0.9 %  100 mL (0.5 mg/mL) infusion, 0.5-4 mg/hr, Intravenous, Continuous, Rust-Chester, Britton L, NP, Last Rate: 8 mL/hr at 07/16/20 1406, 4 mg/hr at 07/16/20 1406 .  HYDROmorphone (DILAUDID) bolus via infusion 0.5 mg, 0.5 mg, Intravenous, Q30 min PRN, Rust-Chester, Britton L, NP, 0.5 mg at 07/16/20 0832 .  insulin aspart (novoLOG) injection 0-15 Units, 0-15 Units, Subcutaneous, Q4H, Rust-Chester, Britton L, NP, 2 Units at 07/16/20 1240 .  MEDLINE mouth rinse, 15 mL, Mouth Rinse, 10 times per day, Flora Lipps, MD, 15 mL at 07/16/20 1319 .  melatonin tablet 5 mg, 5 mg, Per Tube, QHS, Flora Lipps, MD, 5 mg at 07/15/20 2136 .  [COMPLETED] minocycline (MINOCIN) capsule 200 mg, 200 mg, Per Tube, Once, 200 mg at 07/12/20 1447 **FOLLOWED BY** minocycline (MINOCIN) capsule 100 mg, 100 mg, Per Tube, BID, Flora Lipps, MD, 100 mg at 07/16/20 0833 .  multivitamin with minerals tablet 1 tablet, 1  tablet, Per Tube, Daily, Flora Lipps, MD, 1 tablet at 2020/07/17 1240 .  nystatin (MYCOSTATIN/NYSTOP) topical powder, , Topical, BID, Rust-Chester, Huel Cote, NP, Given at 07-17-2020 1031 .  oxyCODONE (Oxy IR/ROXICODONE) immediate release tablet 10 mg, 10 mg, Per Tube, Q4H, Benita Gutter, RPH, 10 mg at 17-Jul-2020 1240 .  pantoprazole sodium (PROTONIX) 40 mg/20 mL oral suspension 40 mg, 40 mg, Per Tube, Daily, Kasa, Kurian, MD, 40 mg at 2020/07/17 1030 .  polyethylene glycol (MIRALAX / GLYCOLAX) packet 17 g, 17 g, Per Tube, Daily PRN, Flora Lipps, MD, 17 g at 2020-07-17 0816 .  polyvinyl alcohol (LIQUIFILM TEARS) 1.4 % ophthalmic solution 1 drop, 1 drop, Both Eyes, BID, Benita Gutter, RPH, 1 drop at 2020/07/17 1031 .  thiamine tablet 100 mg, 100 mg, Per Tube, Daily, Benita Gutter, RPH, 100 mg at 07/17/20 1030 .  vitamin B-12 (CYANOCOBALAMIN) tablet 1,000 mcg, 1,000 mcg, Per Tube, Daily, Flora Lipps, MD, 1,000 mcg at 07-17-20 1030      Indwelling Urinary Catheter continued, requirement due to   Reason to continue  Indwelling Urinary Catheter for strict Intake/Output monitoring for hemodynamic instability   Central Line continued, requirement due to   Reason to continue [employer] Monitoring of central venous pressure or other hemodynamic parameters   Ventilator continued, requirement due to, resp failure    Ventilator Sedation RASS 0 to -2     ASSESSMENT AND PLAN SYNOPSIS Acute Hypoxic Respiratory failure secondary to acute encephalopathy due to Alcohol withdrawals/p trach  +ELIZABETHKINGIA SPECIES in TA cont  ABX -continue Full MV support -continue Bronchodilator Therapy -Wean Fio2 and PEEP as tolerated -will perform SAT/SBT when respiratory parameters are met.  Spontaneous breathing trials are hampered due to his severe agitation and restlessness and inability to wean his sedation. -VAP/VENT bundle implementation  ACUTEDIASTOLIC CARDIAC FAILURE-  -oxygen as needed -Lasix as tolerated   Morbid obesity, possible OSA.  Will certainly impact respiratory mechanics, ventilator weaning Suspect will need to consider additional PEEP   AKI Renal function stable -continue Foley Catheter-assess need -Avoid nephrotoxic agents -Follow urine output, BMP -Ensure adequate renal perfusion, optimize oxygenation -Renal dose medications    NEUROLOGY Acute toxic metabolic encephalopathy, need for sedation Goal RASS -2 to -3 Continue Dilaudid infusion wean as tolerated. Precedex needed to be started again today.  He is also receiving intermittent IV Ativan  CARDIAC ICU monitoring  INFECTIOUS DISEASE -continue antibiotics as prescribed -follow up cultures -follow up ID consultation     GI PROPHYLAXIS as indicated  NUTRITIONAL STATUS Nutrition Status: Nutrition Problem: Inadequate oral intake Etiology: inability to eat Signs/Symptoms: NPO status Interventions: Tube feeding,Prostat,MVI   DIET-->TF's as tolerated Constipation protocol as  indicated  ENDO - will use ICU hypoglycemic\Hyperglycemia protocol if indicated    ELECTROLYTES -follow labs as needed -replace as needed -pharmacy consultation and following   DVT/GI PRX ordered and assessed TRANSFUSIONS AS NEEDED MONITOR FSBS I Assessed the need for Labs I Assessed the need for Foley I Assessed the need for Central Venous Line Family Discussion when available I Assessed the need for Mobilization I made an Assessment of medications to be adjusted accordingly Safety Risk assessment completed    Critical Care Time devoted to patient care services described in this note in 31 minutes.   Overall, patient is critically ill, prognosis is guarded.  Patient with Multiorgan failure and at high risk for cardiac arrest and death.   Rosine Door, MD  2020-07-17 2:45 PM [facility]

## 2020-07-16 NOTE — Progress Notes (Signed)
PHARMACY CONSULT NOTE  Pharmacy Consult for Electrolyte Monitoring and Replacement   Recent Labs: Potassium (mmol/L)  Date Value  07/16/2020 4.5  02/12/2013 3.9   Magnesium (mg/dL)  Date Value  14/43/1540 2.2   Calcium (mg/dL)  Date Value  08/67/6195 9.1   Calcium, Total (mg/dL)  Date Value  09/32/6712 8.7   Albumin (g/dL)  Date Value  45/80/9983 2.3 (L)  02/12/2013 3.6   Phosphorus (mg/dL)  Date Value  38/25/0539 5.7 (H)   Sodium (mmol/L)  Date Value  07/16/2020 143  02/12/2013 139   Assessment: 39 year old male here with encephalopathy s/t alcohol withdrawal. Patient has been intubated and sedated since 11/16. 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 Pharmacy to manage electrolytes.  Tube feeds: Vital 1.5 Cal at 60 mL/hr  + PROSource TF 45 mL 5 times daily per tube  Free water: 200 mL every 4 hours (d/c 12/2)  Diuretics: IV Lasix 40 mg daily (started 12/1, d/c 12/9)  Bowel regimen: Lactulose 20 g BID (d/c 12/2)   Goal of Therapy:  Electrolytes WNL  Plan:   No electrolyte replacement warranted today  Sodium 143, free water flushes on hold since 12/2. Continue to monitor  Follow-up with AM labs tomorrow  Maxyne Derocher A 07/16/2020 1:39 PM

## 2020-07-17 LAB — CBC WITH DIFFERENTIAL/PLATELET
Abs Immature Granulocytes: 0.11 10*3/uL — ABNORMAL HIGH (ref 0.00–0.07)
Basophils Absolute: 0 10*3/uL (ref 0.0–0.1)
Basophils Relative: 0 %
Eosinophils Absolute: 1.2 10*3/uL — ABNORMAL HIGH (ref 0.0–0.5)
Eosinophils Relative: 15 %
HCT: 28.3 % — ABNORMAL LOW (ref 39.0–52.0)
Hemoglobin: 9.4 g/dL — ABNORMAL LOW (ref 13.0–17.0)
Immature Granulocytes: 1 %
Lymphocytes Relative: 23 %
Lymphs Abs: 1.9 10*3/uL (ref 0.7–4.0)
MCH: 34.4 pg — ABNORMAL HIGH (ref 26.0–34.0)
MCHC: 33.2 g/dL (ref 30.0–36.0)
MCV: 103.7 fL — ABNORMAL HIGH (ref 80.0–100.0)
Monocytes Absolute: 0.8 10*3/uL (ref 0.1–1.0)
Monocytes Relative: 10 %
Neutro Abs: 4.1 10*3/uL (ref 1.7–7.7)
Neutrophils Relative %: 51 %
Platelets: 273 10*3/uL (ref 150–400)
RBC: 2.73 MIL/uL — ABNORMAL LOW (ref 4.22–5.81)
RDW: 13.6 % (ref 11.5–15.5)
WBC: 8.1 10*3/uL (ref 4.0–10.5)
nRBC: 0 % (ref 0.0–0.2)

## 2020-07-17 LAB — BASIC METABOLIC PANEL
Anion gap: 9 (ref 5–15)
BUN: 29 mg/dL — ABNORMAL HIGH (ref 6–20)
CO2: 31 mmol/L (ref 22–32)
Calcium: 8.9 mg/dL (ref 8.9–10.3)
Chloride: 103 mmol/L (ref 98–111)
Creatinine, Ser: 0.93 mg/dL (ref 0.61–1.24)
GFR, Estimated: >60 mL/min (ref >60)
Glucose, Bld: 146 mg/dL — ABNORMAL HIGH (ref 70–99)
Potassium: 4.2 mmol/L (ref 3.5–5.1)
Sodium: 143 mmol/L (ref 135–145)

## 2020-07-17 LAB — GLUCOSE, CAPILLARY
Glucose-Capillary: 113 mg/dL — ABNORMAL HIGH (ref 70–99)
Glucose-Capillary: 130 mg/dL — ABNORMAL HIGH (ref 70–99)
Glucose-Capillary: 133 mg/dL — ABNORMAL HIGH (ref 70–99)
Glucose-Capillary: 137 mg/dL — ABNORMAL HIGH (ref 70–99)
Glucose-Capillary: 137 mg/dL — ABNORMAL HIGH (ref 70–99)
Glucose-Capillary: 138 mg/dL — ABNORMAL HIGH (ref 70–99)
Glucose-Capillary: 159 mg/dL — ABNORMAL HIGH (ref 70–99)

## 2020-07-17 LAB — MAGNESIUM: Magnesium: 2.1 mg/dL (ref 1.7–2.4)

## 2020-07-17 LAB — PHOSPHORUS: Phosphorus: 5.1 mg/dL — ABNORMAL HIGH (ref 2.5–4.6)

## 2020-07-17 MED ORDER — DIAZEPAM 2 MG PO TABS
10.0000 mg | ORAL_TABLET | Freq: Four times a day (QID) | ORAL | Status: DC
  Administered 2020-07-17 – 2020-07-18 (×5): 10 mg
  Filled 2020-07-17 (×5): qty 5

## 2020-07-17 MED ORDER — DOCUSATE SODIUM 50 MG/5ML PO LIQD
100.0000 mg | Freq: Two times a day (BID) | ORAL | Status: DC
  Administered 2020-07-17 – 2020-07-30 (×19): 100 mg
  Filled 2020-07-17 (×21): qty 10

## 2020-07-17 MED ORDER — OLANZAPINE 10 MG IM SOLR
10.0000 mg | Freq: Once | INTRAMUSCULAR | Status: AC
  Administered 2020-07-20: 10 mg via INTRAMUSCULAR
  Filled 2020-07-17: qty 10

## 2020-07-17 MED ORDER — HALOPERIDOL LACTATE 5 MG/ML IJ SOLN
2.0000 mg | Freq: Four times a day (QID) | INTRAMUSCULAR | Status: DC
  Administered 2020-07-17 – 2020-07-25 (×30): 2 mg via INTRAVENOUS
  Filled 2020-07-17 (×30): qty 1

## 2020-07-17 MED ORDER — POLYETHYLENE GLYCOL 3350 17 G PO PACK
17.0000 g | PACK | Freq: Every day | ORAL | Status: DC
  Administered 2020-07-18 – 2020-07-29 (×7): 17 g
  Filled 2020-07-17 (×8): qty 1

## 2020-07-17 NOTE — Progress Notes (Signed)
CRITICAL CARE NOTE 39 yo male significant history of ETOH and polysubstance abuse presented to the ED with altered mental status admitted to the ICU requiring precedex drip and ultimately intubation & mechanical ventilation.  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 Despite decreasing versed to 7 and completely stopping Propofol patient is only partially arousable. Will continue to wean sedation to RASS-0 with plan for SBT. 06/24/20- patient is being weaned off sedation as possible.  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  12/1- patient became severely aggitated, tachypneic, tachycardic and with heavy endotracheal secretions. He is febrile 12/2-Patient with extremely high tolerance to sedation have been unable to bring down dosing. Reviewed care plan with mother of patient [name] - plan for trache - ENT consult placed. 07/07/2020:Tracheostomy planned for 12/6, remains encephalopathic, does not follow commands, agitation when sedation is lightened. 07/08/2020:Moves all fours, still on vent, less agitated 12/6TRACH placed by ENT 12/8 wean sedation, assess for PS trials 12/8 ELIZABETHKINGIA SPECIES in TA started ABX 12/13 severe agitation   Procedures:  06/20/20 ETT >> 06/20/20 CVC 3 L >> will need PICC line 12/6 [facility]  Significant Diagnostic Tests:  06/19/20 CT head >> no acute intracranial abnormality 06/20/20 US  abdomen>>mild hepatic steatosis 06/21/20 Echocardiogram>>LVEF 65-70%, no wall motion abnormalities, no other abnormalities noted Micro Data:  06/19/20 COVID-19 >> negative 06/19/20 Influenza PCR A/B >> negative 06/20/20 MRSA PCR >> negative 06/20/20 tracheal aspirate >> negative  Antimicrobials:  06/21/20 Unasyn >>12/3 07/05/2020 fluconazole>>12/8   CC  follow up respiratory failure  SUBJECTIVE Patient remains critically ill Prognosis is guarded S/p trach   BP 116/75   Pulse 63   Temp 98.6 F (37 C) (Oral)   Resp 20   Ht 5' 10"  (1.778 m)   Wt (!) 142.6 kg   SpO2 95%   BMI 45.11 kg/m    I/O last 3 completed shifts: In: 2713.2 [I.V.:573.2; NG/GT:2140] Out: 2250 [Urine:2250] No intake/output data recorded.  SpO2: 95 % FiO2 (%): 35 %  Estimated body mass index is 45.11 kg/m as calculated from the following:   Height as of this encounter: 5' 10"  (1.778 m).   Weight as of this encounter: 142.6 kg.   REVIEW OF SYSTEMS  PATIENT IS UNABLE TO PROVIDE COMPLETE REVIEW OF SYSTEM S DUE TO SEVERE CRITICAL ILLNESS AND ENCEPHALOPATHY       PHYSICAL EXAMINATION:  GENERAL:critically ill appearing, +resp distress HEAD: Normocephalic, atraumatic.  EYES: Pupils equal, round, reactive to light.  No scleral icterus.  MOUTH: Moist mucosal membrane. NECK: Supple. No thyromegaly. No nodules. No JVD. S/p trach PULMONARY: +rhonchi, +wheezing CARDIOVASCULAR: S1 and S2. Regular rate and rhythm. No murmurs, rubs, or gallops.  GASTROINTESTINAL: Soft, nontender, -distended. Positive bowel sounds.  MUSCULOSKELETAL: No swelling, clubbing, or edema.  NEUROLOGIC: obtunded SKIN:intact,warm,dry    CULTURE RESULTS   Recent Results (from the past 240 hour(s))  Culture, respiratory (non-expectorated)     Status: None (Preliminary result)   Collection Time: 07/10/20 10:53 AM   Specimen: Tracheal Aspirate;  Respiratory  Result Value Ref Range Status   Specimen Description   Final     TRACHEAL ASPIRATE Performed at [facility], [address] [place], [place] [address]    Special Requests   Final    NONE Performed at [facility], [address] [place], [place] [address]    Gram Stain   Final    FEW WBC PRESENT, PREDOMINANTLY PMN RARE GRAM NEGATIVE RODS    Culture   Final    MODERATE ELIZABETHKINGIA MENINGOSEPTICA SUSCEPTIBILITIES TO FOLLOW Performed at [facility], [place] [address] [place], [place] [address]    Report Status PENDING  Incomplete  MRSA PCR Screening     Status: None   Collection Time: 07/10/20  1:44 PM   Specimen: Nasopharyngeal  Result Value Ref Range Status   MRSA by PCR NEGATIVE NEGATIVE Final    Comment:        The GeneXpert MRSA Assay (FDA approved for NASAL specimens only), is one component of a comprehensive MRSA colonization surveillance program. It is not intended to diagnose MRSA infection nor to guide or monitor treatment for MRSA infections. Performed at [facility], [address], [place], [place] [address]           IMAGING    No results found.   Nutrition Status: Nutrition Problem: Inadequate oral intake Etiology: inability to eat Signs/Symptoms: NPO status Interventions: Tube feeding,Prostat,MVI     /   Indwelling Urinary Catheter continued, requirement due to   Reason to continue Indwelling Urinary Catheter strict Intake/Output monitoring for hemodynamic instability         Ventilator continued, requirement due to severe respiratory failure   Ventilator Sedation RASS 0 to -2      ASSESSMENT AND PLAN SYNOPSIS  Acute Hypoxic Respiratory failure secondary to acute encephalopathy due to Alcohol withdrawal s/p trach +Acute Hypoxic Respiratory failure secondary to acute encephalopathy due to Alcohol withdrawal +ELIZABETHKINGIA SPECIES in TA started ABX Pneumonia, failure to we4an from vent and severe agitation from ETOH abuse with signs and sympoms  of brain damage at micro cellular level    Severe ACUTE Hypoxic and Hypercapnic Respiratory Failure S/p trach -continue Full MV support -continue Bronchodilator Therapy -Wean Fio2 and PEEP as tolerated -will perform SAT/SBT when respiratory parameters are met -VAP/VENT bundle implementation  ACUTE DIASTOLIC CARDIAC FAILURE-  -oxygen as needed -Lasix as tolerated   Morbid obesity, possible OSA.   Will certainly impact respiratory mechanics, ventilator weaning Suspect will need to consider additional PEEP S/p trach  ACUTE KIDNEY INJURY/Renal Failure -continue Foley Catheter-assess need -Avoid nephrotoxic agents -Follow urine output, BMP -Ensure adequate renal perfusion, optimize oxygenation -Renal dose medications    NEUROLOGY Acute toxic metabolic encephalopathy, need for sedation Goal RASS -2 to -3 We started Scheduled Valium and Pain meds Wean off all IV infusions   CARDIAC ICU monitoring  INFECTIOUS DISEASE -continue antibiotics as prescribed -follow up cultures -follow up ID consultation    GI GI PROPHYLAXIS as indicated  NUTRITIONAL STATUS DIET-->TF's as tolerated Constipation protocol as indicated  ELECTROLYTES -follow labs as needed -replace as needed -pharmacy consultation and following    ENDO - will use ICU hypoglycemic\Hyperglycemia protocol if indicated    DVT/GI PRX ordered and assessed TRANSFUSIONS AS NEEDED MONITOR FSBS I Assessed the need for Labs I Assessed the need for Foley I Assessed the need for Central Venous Line Family Discussion when available I Assessed the need for Mobilization I made an Assessment of medications to  be adjusted accordingly Safety Risk assessment completed  CASE DISCUSSED IN MULTIDISCIPLINARY ROUNDS WITH ICU TEAM   Critical Care Time devoted to patient care services described in this note is 45  minutes.   Overall, patient is critically ill, prognosis is guarded.  Patient with Multiorgan failure  and at high risk for cardiac arrest and death.    Corrin Parker, M.D.  [facility]  Medical Director [place] Director [facility] Cardio-Pulmonary Department

## 2020-07-17 NOTE — Plan of Care (Signed)
Pt remains on precedex and dilaudid for agitation management. Was able to reduce dilaudid dose by 50% today. Giving PRN ativan. Sitter at bedside for safety. Tube feeds restarted, trach sutures removed. NG repositioned to resolve redness on R nare. Jae Dire, long time friend, able to elink in today.   Problem: Education: Goal: Knowledge of General Education information will improve Description: Including pain rating scale, medication(s)/side effects and non-pharmacologic comfort measures Outcome: Not Progressing   Problem: Health Behavior/Discharge Planning: Goal: Ability to manage health-related needs will improve Outcome: Not Progressing   Problem: Activity: Goal: Risk for activity intolerance will decrease Outcome: Not Progressing   Problem: Coping: Goal: Level of anxiety will decrease Outcome: Not Progressing   Problem: Elimination: Goal: Will not experience complications related to urinary retention Outcome: Not Progressing   Problem: Pain Managment: Goal: General experience of comfort will improve Outcome: Not Progressing   Problem: Clinical Measurements: Goal: Ability to maintain clinical measurements within normal limits will improve Outcome: Progressing Goal: Will remain free from infection Outcome: Progressing Goal: Diagnostic test results will improve Outcome: Progressing Goal: Respiratory complications will improve Outcome: Progressing Goal: Cardiovascular complication will be avoided Outcome: Progressing   Problem: Nutrition: Goal: Adequate nutrition will be maintained Outcome: Progressing   Problem: Elimination: Goal: Will not experience complications related to bowel motility Outcome: Progressing   Problem: Safety: Goal: Ability to remain free from injury will improve Outcome: Progressing   Problem: Skin Integrity: Goal: Risk for impaired skin integrity will decrease Outcome: Progressing

## 2020-07-17 NOTE — Consult Note (Signed)
[facility] Face-to-Face Psychiatry Consult    Reason for Consult: Consult for this 39 year old man with alcohol abuse continuing to exhibit delirium Referring Physician:  Kasa Patient Identification: Nicholas Valdez MRN:  [id number] Principal Diagnosis: Alcohol withdrawal delirium [facility]) Diagnosis:  Principal Problem:   Alcohol withdrawal delirium ([place]) Active Problems:   Increased ammonia level   Acute metabolic encephalopathy   Elevated LFTs   Tobacco abuse   Elevated lactic acid level   Acute respiratory failure with hypoxia ([place])   Total Time spent with patient: 1 hour  Subjective:   Nicholas Valdez is a 39 y.o. male patient admitted with patient nonverbal.  HPI: Patient seen chart reviewed.  39 year old man with history of alcohol abuse came into the hospital intoxicated and soon exhibiting signs of alcohol withdrawal.  Has had extended ICU stay with delirium and inability to be extubated.  Seen today the patient remains agitated and confused.  Could not communicate with him.  Patient to delirious for interaction.  Nursing tech in the room says that she has to physically keep him from crawling out of bed and he does not respond when she tries to verbally redirect him.  Patient is on standing doses of diazepam as well as standing doses of narcotics at this point.  Past Psychiatric History: No past psychiatric history other than alcohol abuse  Risk to Self:   Risk to Others:   Prior Inpatient Therapy:   Prior Outpatient Therapy:    Past Medical History:  Past Medical History:  Diagnosis Date  . Alcohol abuse   . Drug abuse ([place])   . Hepatitis C     Past Surgical History:  Procedure Laterality Date  . TRACHEOSTOMY TUBE PLACEMENT N/A 07/10/2020   Procedure: TRACHEOSTOMY;  Surgeon: Carloyn Manner, MD;  Location: [facility] ORS;  Service: ENT;  Laterality: N/A;   Family History:  Family History  Problem Relation Age of Onset  . Hypertension Other    Family Psychiatric  History:  None reported Social History:  Social History   Substance and Sexual Activity  Alcohol Use Yes   Comment: Pt states that he usually drinks a pint of whiskey per day but has had none today.      Social History   Substance and Sexual Activity  Drug Use Yes  . Types: Marijuana    Social History   Socioeconomic History  . Marital status: Single    Spouse name: Not on file  . Number of children: Not on file  . Years of education: Not on file  . Highest education level: Not on file  Occupational History  . Not on file  Tobacco Use  . Smoking status: Current Every Day Smoker  . Smokeless tobacco: Never Used  Vaping Use  . Vaping Use: Former  Substance and Sexual Activity  . Alcohol use: Yes    Comment: Pt states that he usually drinks a pint of whiskey per day but has had none today.   . Drug use: Yes    Types: Marijuana  . Sexual activity: Not on file  Other Topics Concern  . Not on file  Social History Narrative  . Not on file   Social Determinants of Health   Financial Resource Strain: Not on file  Food Insecurity: Not on file  Transportation Needs: Not on file  Physical Activity: Not on file  Stress: Not on file  Social Connections: Not on file   Additional Social History:    Allergies:   Allergies  Allergen Reactions  . Ondansetron Hives  . Promethazine Hives  . Penicillins Hives    Did it involve swelling of the face/tongue/throat, SOB, or low BP? Yes Did it involve sudden or severe rash/hives, skin peeling, or any reaction on the inside of your mouth or nose? Yes Did you need to seek medical attention at a hospital or doctor's office? Yes When did it last happen?childhood If all above answers are "NO", may proceed with cephalosporin use.  Marland Kitchen Zofran [Ondansetron Hcl] Hives    Labs:  Results for orders placed or performed during the hospital encounter of 06/19/20 (from the past 48 hour(s))  Glucose, capillary     Status: Abnormal   Collection  Time: 07/15/20  7:42 PM  Result Value Ref Range   Glucose-Capillary 141 (H) 70 - 99 mg/dL    Comment: Glucose reference range applies only to samples taken after fasting for at least 8 hours.  Glucose, capillary     Status: Abnormal   Collection Time: 07/15/20 11:03 PM  Result Value Ref Range   Glucose-Capillary 121 (H) 70 - 99 mg/dL    Comment: Glucose reference range applies only to samples taken after fasting for at least 8 hours.  Glucose, capillary     Status: Abnormal   Collection Time: 07/16/20  3:10 AM  Result Value Ref Range   Glucose-Capillary 131 (H) 70 - 99 mg/dL    Comment: Glucose reference range applies only to samples taken after fasting for at least 8 hours.  CBC with Differential/Platelet     Status: Abnormal   Collection Time: 07/16/20  7:42 AM  Result Value Ref Range   WBC 7.1 4.0 - 10.5 K/uL   RBC 2.93 (L) 4.22 - 5.81 MIL/uL   Hemoglobin 10.0 (L) 13.0 - 17.0 g/dL   HCT 30.6 (L) 39.0 - 52.0 %   MCV 104.4 (H) 80.0 - 100.0 fL   MCH 34.1 (H) 26.0 - 34.0 pg   MCHC 32.7 30.0 - 36.0 g/dL   RDW 13.6 11.5 - 15.5 %   Platelets 273 150 - 400 K/uL   nRBC 0.0 0.0 - 0.2 %   Neutrophils Relative % 41 %   Neutro Abs 2.9 1.7 - 7.7 K/uL   Lymphocytes Relative 29 %   Lymphs Abs 2.1 0.7 - 4.0 K/uL   Monocytes Relative 13 %   Monocytes Absolute 0.9 0.1 - 1.0 K/uL   Eosinophils Relative 15 %   Eosinophils Absolute 1.1 (H) 0.0 - 0.5 K/uL   Basophils Relative 1 %   Basophils Absolute 0.1 0.0 - 0.1 K/uL   Immature Granulocytes 1 %   Abs Immature Granulocytes 0.10 (H) 0.00 - 0.07 K/uL    Comment: Performed at [facility], [address], [place], [place] [address]  Basic metabolic panel     Status: Abnormal   Collection Time: 07/16/20  7:42 AM  Result Value Ref Range   Sodium 143 135 - 145 mmol/L   Potassium 4.5 3.5 - 5.1 mmol/L   Chloride 102 98 - 111 mmol/L   CO2 32 22 - 32 mmol/L   Glucose, Bld 143 (H) 70 - 99 mg/dL    Comment: Glucose reference range applies  only to samples taken after fasting for at least 8 hours.   BUN 32 (H) 6 - 20 mg/dL   Creatinine, Ser 1.18 0.61 - 1.24 mg/dL   Calcium 9.1 8.9 - 10.3 mg/dL   GFR, Estimated >60 >60 mL/min    Comment: (NOTE) Calculated  using the CKD-EPI Creatinine Equation (2021)    Anion gap 9 5 - 15    Comment: Performed at [facility], [address] [place], [place] [address]  Magnesium     Status: None   Collection Time: 07/16/20  7:42 AM  Result Value Ref Range   Magnesium 2.2 1.7 - 2.4 mg/dL    Comment: Performed at [facility], [address] [place], [place] [address]  Phosphorus     Status: Abnormal   Collection Time: 07/16/20  7:42 AM  Result Value Ref Range   Phosphorus 5.7 (H) 2.5 - 4.6 mg/dL    Comment: Performed at [facility], [address] [place], [place] [address]  Glucose, capillary     Status: Abnormal   Collection Time: 07/16/20  7:51 AM  Result Value Ref Range   Glucose-Capillary 125 (H) 70 - 99 mg/dL    Comment: Glucose reference range applies only to samples taken after fasting for at least 8 hours.  Glucose, capillary     Status: Abnormal   Collection Time: 07/16/20 12:33 PM  Result Value Ref Range   Glucose-Capillary 126 (H) 70 - 99 mg/dL    Comment: Glucose reference range applies only to samples taken after fasting for at least 8 hours.  Glucose, capillary     Status: Abnormal   Collection Time: 07/16/20  3:48 PM  Result Value Ref Range   Glucose-Capillary 120 (H) 70 - 99 mg/dL    Comment: Glucose reference range applies only to samples taken after fasting for at least 8 hours.  Glucose, capillary     Status: Abnormal   Collection Time: 07/16/20  9:02 PM  Result Value Ref Range   Glucose-Capillary 125 (H) 70 - 99 mg/dL    Comment: Glucose reference range applies only to samples taken after fasting for at least 8 hours.  Glucose, capillary     Status: Abnormal   Collection Time: 07/17/20 12:35 AM  Result Value Ref Range    Glucose-Capillary 133 (H) 70 - 99 mg/dL    Comment: Glucose reference range applies only to samples taken after fasting for at least 8 hours.  Glucose, capillary     Status: Abnormal   Collection Time: 07/17/20  4:11 AM  Result Value Ref Range   Glucose-Capillary 138 (H) 70 - 99 mg/dL    Comment: Glucose reference range applies only to samples taken after fasting for at least 8 hours.  CBC with Differential/Platelet     Status: Abnormal   Collection Time: 07/17/20  4:53 AM  Result Value Ref Range   WBC 8.1 4.0 - 10.5 K/uL   RBC 2.73 (L) 4.22 - 5.81 MIL/uL   Hemoglobin 9.4 (L) 13.0 - 17.0 g/dL   HCT 28.3 (L) 39.0 - 52.0 %   MCV 103.7 (H) 80.0 - 100.0 fL   MCH 34.4 (H) 26.0 - 34.0 pg   MCHC 33.2 30.0 - 36.0 g/dL   RDW 13.6 11.5 - 15.5 %   Platelets 273 150 - 400 K/uL   nRBC 0.0 0.0 - 0.2 %   Neutrophils Relative % 51 %   Neutro Abs 4.1 1.7 - 7.7 K/uL   Lymphocytes Relative 23 %   Lymphs Abs 1.9 0.7 - 4.0 K/uL   Monocytes Relative 10 %   Monocytes Absolute 0.8 0.1 - 1.0 K/uL   Eosinophils Relative 15 %   Eosinophils Absolute 1.2 (H) 0.0 - 0.5 K/uL   Basophils Relative 0 %   Basophils  Absolute 0.0 0.0 - 0.1 K/uL   Immature Granulocytes 1 %   Abs Immature Granulocytes 0.11 (H) 0.00 - 0.07 K/uL    Comment: Performed at [facility], [address] [place], [place] [address]  Basic metabolic panel     Status: Abnormal   Collection Time: 07/17/20  4:53 AM  Result Value Ref Range   Sodium 143 135 - 145 mmol/L   Potassium 4.2 3.5 - 5.1 mmol/L   Chloride 103 98 - 111 mmol/L   CO2 31 22 - 32 mmol/L   Glucose, Bld 146 (H) 70 - 99 mg/dL    Comment: Glucose reference range applies only to samples taken after fasting for at least 8 hours.   BUN 29 (H) 6 - 20 mg/dL   Creatinine, Ser 0.93 0.61 - 1.24 mg/dL   Calcium 8.9 8.9 - 10.3 mg/dL   GFR, Estimated >60 >60 mL/min    Comment: (NOTE) Calculated using the CKD-EPI Creatinine Equation (2021)    Anion gap 9 5 - 15     Comment: Performed at [facility], [address] [place], [place] [address]  Phosphorus     Status: Abnormal   Collection Time: 07/17/20  4:53 AM  Result Value Ref Range   Phosphorus 5.1 (H) 2.5 - 4.6 mg/dL    Comment: Performed at [facility], [address] [place], [place] [address]  Magnesium     Status: None   Collection Time: 07/17/20  4:53 AM  Result Value Ref Range   Magnesium 2.1 1.7 - 2.4 mg/dL    Comment: Performed at [facility], [address] [place], [place] [address]  Glucose, capillary     Status: Abnormal   Collection Time: 07/17/20  7:53 AM  Result Value Ref Range   Glucose-Capillary 130 (H) 70 - 99 mg/dL    Comment: Glucose reference range applies only to samples taken after fasting for at least 8 hours.  Glucose, capillary     Status: Abnormal   Collection Time: 07/17/20 11:11 AM  Result Value Ref Range   Glucose-Capillary 137 (H) 70 - 99 mg/dL    Comment: Glucose reference range applies only to samples taken after fasting for at least 8 hours.  Glucose, capillary     Status: Abnormal   Collection Time: 07/17/20  3:30 PM  Result Value Ref Range   Glucose-Capillary 113 (H) 70 - 99 mg/dL    Comment: Glucose reference range applies only to samples taken after fasting for at least 8 hours.    Current Facility-Administered Medications  Medication Dose Route Frequency Provider Last Rate Last Admin  . acetaminophen (TYLENOL) tablet 650 mg  650 mg Per Tube Q6H PRN Rust-Chester, Toribio Harbour L, NP   650 mg at 07/11/20 0449   Or  . acetaminophen (TYLENOL) suppository 650 mg  650 mg Rectal Q6H PRN Rust-Chester, Britton L, NP      . albuterol (PROVENTIL) (2.5 MG/3ML) 0.083% nebulizer solution 2.5 mg  2.5 mg Nebulization Q4H PRN Rosine Door, MD      . Henrietta Hoover lotion   Topical PRN Flora Lipps, MD   1 application at 57/26/20 1429  . chlorhexidine gluconate (MEDLINE KIT) (PERIDEX) 0.12 % solution 15 mL  15 mL Mouth Rinse BID Flora Lipps,  MD   15 mL at 07/17/20 0821  . Chlorhexidine Gluconate Cloth 2 % PADS 6 each  6 each Topical Daily Mercy Riding, MD   6 each at 07/16/20 2200  . dexmedetomidine (PRECEDEX)  400 MCG/100ML (4 mcg/mL) infusion  0.4-1.2 mcg/kg/hr Intravenous Titrated Rosine Door, MD 42.9 mL/hr at 07/17/20 1733 1.2 mcg/kg/hr at 07/17/20 1733  . diazepam (VALIUM) tablet 10 mg  10 mg Per Tube Q6H Rust-Chester, Britton L, NP   10 mg at 07/17/20 1726  . docusate (COLACE) 50 MG/5ML liquid 100 mg  100 mg Per Tube BID Flora Lipps, MD      . enoxaparin (LOVENOX) injection 72.5 mg  0.5 mg/kg Subcutaneous Q24H Rust-Chester, Britton L, NP   72.5 mg at 07/17/20 1021  . feeding supplement (PROSource TF) liquid 45 mL  45 mL Per Tube 5 X Daily Flora Lipps, MD   45 mL at 07/17/20 1726  . feeding supplement (VITAL 1.5 CAL) liquid 1,000 mL  1,000 mL Per Tube Continuous Flora Lipps, MD   Stopped at 07/17/20 0430  . folic acid (FOLVITE) tablet 1 mg  1 mg Per Tube Daily Flora Lipps, MD   1 mg at 07/17/20 1021  . gabapentin (NEURONTIN) 250 MG/5ML solution 600 mg  600 mg Per Tube Q8H Aleskerov, Fuad, MD   600 mg at 07/17/20 1416  . haloperidol lactate (HALDOL) injection 2 mg  2 mg Intravenous Q6H Ilianna Bown T, MD      . HYDROmorphone (DILAUDID) 50 mg in sodium chloride 0.9 % 100 mL (0.5 mg/mL) infusion  0.5-4 mg/hr Intravenous Continuous Rust-Chester, Britton L, NP 4 mL/hr at 07/17/20 1610 2 mg/hr at 07/17/20 1610  . HYDROmorphone (DILAUDID) bolus via infusion 0.5 mg  0.5 mg Intravenous Q30 min PRN Rust-Chester, Britton L, NP   0.5 mg at 07/16/20 0832  . insulin aspart (novoLOG) injection 0-15 Units  0-15 Units Subcutaneous Q4H Rust-Chester, Britton L, NP   2 Units at 07/17/20 1147  . LORazepam (ATIVAN) injection 1-4 mg  1-4 mg Intravenous Q2H PRN Rosine Door, MD   4 mg at 07/17/20 1502  . MEDLINE mouth rinse  15 mL Mouth Rinse 10 times per day Flora Lipps, MD   15 mL at 07/17/20 1727  . melatonin tablet 5 mg  5 mg Per Tube QHS  Flora Lipps, MD   5 mg at 07/16/20 2218  . minocycline (MINOCIN) capsule 100 mg  100 mg Per Tube BID Flora Lipps, MD   100 mg at 07/17/20 0821  . multivitamin with minerals tablet 1 tablet  1 tablet Per Tube Daily Flora Lipps, MD   1 tablet at 07/17/20 1146  . nystatin (MYCOSTATIN/NYSTOP) topical powder   Topical BID Rust-Chester, Huel Cote, NP   Given at 07/17/20 1021  . OLANZapine (ZYPREXA) injection 10 mg  10 mg Intramuscular Once Rust-Chester, Britton L, NP      . oxyCODONE (Oxy IR/ROXICODONE) immediate release tablet 10 mg  10 mg Per Tube Q4H Lockie Mola B, RPH   10 mg at 07/17/20 1614  . pantoprazole sodium (PROTONIX) 40 mg/20 mL oral suspension 40 mg  40 mg Per Tube Daily Flora Lipps, MD   40 mg at 07/17/20 1021  . [START ON 07/18/2020] polyethylene glycol (MIRALAX / GLYCOLAX) packet 17 g  17 g Per Tube Daily Kasa, Kurian, MD      . polyvinyl alcohol (LIQUIFILM TEARS) 1.4 % ophthalmic solution 1 drop  1 drop Both Eyes BID Benita Gutter, RPH   1 drop at 07/17/20 1021  . thiamine tablet 100 mg  100 mg Per Tube Daily Benita Gutter, RPH   100 mg at 07/17/20 1021  . vitamin B-12 (CYANOCOBALAMIN) tablet 1,000 mcg  1,000  mcg Per Tube Daily Flora Lipps, MD   1,000 mcg at 07/17/20 1021    Musculoskeletal: Strength & Muscle Tone: decreased Gait & Station: unable to stand Patient leans: N/A  Psychiatric Specialty Exam: Physical Exam Vitals and nursing note reviewed.  Constitutional:      Appearance: He is well-developed and well-nourished.  HENT:     Head: Normocephalic and atraumatic.  Eyes:     Conjunctiva/sclera: Conjunctivae normal.     Pupils: Pupils are equal, round, and reactive to light.  Neck:   Cardiovascular:     Heart sounds: Normal heart sounds.  Pulmonary:     Effort: Pulmonary effort is normal.  Abdominal:     Palpations: Abdomen is soft.  Musculoskeletal:        General: Normal range of motion.     Cervical back: Normal range of motion.  Skin:     General: Skin is warm and dry.  Psychiatric:        Attention and Perception: He is inattentive.        Speech: He is noncommunicative.     Review of Systems  Unable to perform ROS: Patient nonverbal    Blood pressure (!) 135/96, pulse 88, temperature 100 F (37.8 C), temperature source Oral, resp. rate (!) 22, height 5' 10"  (1.778 m), weight (!) 142.6 kg, SpO2 100 %.Body mass index is 45.11 kg/m.  General Appearance: Disheveled  Eye Contact:  None  Speech:  Negative  Volume:  Decreased  Mood:  Negative  Affect:  Negative  Thought Process:  NA  Orientation:  Negative  Thought Content:  Negative  Suicidal Thoughts:  No  Homicidal Thoughts:  No  Memory:  Negative  Judgement:  Negative  Insight:  Negative  Psychomotor Activity:  Negative  Concentration:  Concentration: Negative  Recall:  Negative  Fund of Knowledge:  Negative  Language:  Negative  Akathisia:  Negative  Handed:  Right  AIMS (if indicated):     Assets:  Social Support  ADL's:  Impaired  Cognition:  Impaired,  Severe  Sleep:        Treatment Plan Summary: Plan Patient remains delirious.  Probably multifactorial.  Still probably having some withdrawal at this point.  At some point would recommend gradual withdrawal of the diazepam and the opiates although I understand he has gotten more agitated when sedatives have been withdrawn so far.  I am adding standing doses of Haldol 2 mg IV every 6 hours round-the-clock to see if that controls some of the agitation so that other medicines can be gradually tapered.  We will follow-up.  Disposition: Patient does not meet criteria for psychiatric inpatient admission.  Alethia Berthold, MD 07/17/2020 5:44 PM

## 2020-07-17 NOTE — Progress Notes (Signed)
PHARMACY CONSULT NOTE  Pharmacy Consult for Electrolyte Monitoring and Replacement   Recent Labs: Potassium (mmol/L)  Date Value  07/17/2020 4.2  02/12/2013 3.9   Magnesium (mg/dL)  Date Value  20/60/1561 2.1   Calcium (mg/dL)  Date Value  53/79/4327 8.9   Calcium, Total (mg/dL)  Date Value  61/47/0929 8.7   Albumin (g/dL)  Date Value  57/47/3403 2.3 (L)  02/12/2013 3.6   Phosphorus (mg/dL)  Date Value  70/96/4383 5.1 (H)   Sodium (mmol/L)  Date Value  07/17/2020 143  02/12/2013 139   Assessment: 39 year old male here with encephalopathy s/t alcohol withdrawal. Patient has been intubated and sedated since 11/16. 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 Pharmacy to manage electrolytes.  Tube feeds: Vital 1.5 Cal at 60 mL/hr  + PROSource TF 45 mL 5 times daily per tube  Free water: 200 mL every 4 hours (d/c 12/2)  Diuretics: IV Lasix 40 mg daily (started 12/1, d/c 12/9)  Bowel regimen: Lactulose 20 g BID (d/c 12/2)   Goal of Therapy:  Electrolytes WNL  Plan:   No electrolyte replacement warranted today  Continue to follow along  Pricilla Riffle, PharmD 07/17/2020 1:39 PM

## 2020-07-17 NOTE — Progress Notes (Signed)
Secure chat with Dr.Juengel regarding trach sutures, who requested I remove sutures. Verbal order placed, sutures removed. Pt tolerated well. Skin prep to irritated skin around site and dressing placed.

## 2020-07-17 NOTE — Progress Notes (Signed)
From patient rounds today: continue to wean dilaudid as tolerated, remove sutures, restart TF as previously ordered, continue foley to rule out retention as cause of agitation.

## 2020-07-17 NOTE — Progress Notes (Signed)
Attempted video chat with family via email unsuccessful RN made aware

## 2020-07-18 ENCOUNTER — Inpatient Hospital Stay: Payer: Medicaid Other

## 2020-07-18 LAB — CBC WITH DIFFERENTIAL/PLATELET
Abs Immature Granulocytes: 0.1 10*3/uL — ABNORMAL HIGH (ref 0.00–0.07)
Basophils Absolute: 0 10*3/uL (ref 0.0–0.1)
Basophils Relative: 0 %
Eosinophils Absolute: 0.9 10*3/uL — ABNORMAL HIGH (ref 0.0–0.5)
Eosinophils Relative: 10 %
HCT: 27.5 % — ABNORMAL LOW (ref 39.0–52.0)
Hemoglobin: 9.4 g/dL — ABNORMAL LOW (ref 13.0–17.0)
Immature Granulocytes: 1 %
Lymphocytes Relative: 20 %
Lymphs Abs: 1.8 10*3/uL (ref 0.7–4.0)
MCH: 34.1 pg — ABNORMAL HIGH (ref 26.0–34.0)
MCHC: 34.2 g/dL (ref 30.0–36.0)
MCV: 99.6 fL (ref 80.0–100.0)
Monocytes Absolute: 1 10*3/uL (ref 0.1–1.0)
Monocytes Relative: 11 %
Neutro Abs: 5 10*3/uL (ref 1.7–7.7)
Neutrophils Relative %: 58 %
Platelets: 273 10*3/uL (ref 150–400)
RBC: 2.76 MIL/uL — ABNORMAL LOW (ref 4.22–5.81)
RDW: 13.7 % (ref 11.5–15.5)
WBC: 8.7 10*3/uL (ref 4.0–10.5)
nRBC: 0 % (ref 0.0–0.2)

## 2020-07-18 LAB — BASIC METABOLIC PANEL
Anion gap: 9 (ref 5–15)
BUN: 22 mg/dL — ABNORMAL HIGH (ref 6–20)
CO2: 28 mmol/L (ref 22–32)
Calcium: 8.9 mg/dL (ref 8.9–10.3)
Chloride: 101 mmol/L (ref 98–111)
Creatinine, Ser: 0.9 mg/dL (ref 0.61–1.24)
GFR, Estimated: >60 mL/min (ref >60)
Glucose, Bld: 152 mg/dL — ABNORMAL HIGH (ref 70–99)
Potassium: 4.6 mmol/L (ref 3.5–5.1)
Sodium: 138 mmol/L (ref 135–145)

## 2020-07-18 LAB — GLUCOSE, CAPILLARY
Glucose-Capillary: 160 mg/dL — ABNORMAL HIGH (ref 70–99)
Glucose-Capillary: 149 mg/dL — ABNORMAL HIGH (ref 70–99)
Glucose-Capillary: 131 mg/dL — ABNORMAL HIGH (ref 70–99)
Glucose-Capillary: 114 mg/dL — ABNORMAL HIGH (ref 70–99)
Glucose-Capillary: 107 mg/dL — ABNORMAL HIGH (ref 70–99)
Glucose-Capillary: 111 mg/dL — ABNORMAL HIGH (ref 70–99)

## 2020-07-18 LAB — TRIGLYCERIDES: Triglycerides: 219 mg/dL — ABNORMAL HIGH (ref <150)

## 2020-07-18 MED ORDER — PROPOFOL 1000 MG/100ML IV EMUL
5.0000 ug/kg/min | INTRAVENOUS | Status: DC
  Administered 2020-07-18: 60 ug/kg/min via INTRAVENOUS
  Administered 2020-07-18: 50 ug/kg/min via INTRAVENOUS
  Administered 2020-07-18: 25 ug/kg/min via INTRAVENOUS
  Administered 2020-07-18 – 2020-07-19 (×2): 30 ug/kg/min via INTRAVENOUS
  Administered 2020-07-19: 40 ug/kg/min via INTRAVENOUS
  Administered 2020-07-19: 25 ug/kg/min via INTRAVENOUS
  Administered 2020-07-20: 70 ug/kg/min via INTRAVENOUS
  Administered 2020-07-20: 80 ug/kg/min via INTRAVENOUS
  Administered 2020-07-20: 23.441 ug/kg/min via INTRAVENOUS
  Administered 2020-07-20 (×2): 25 ug/kg/min via INTRAVENOUS
  Administered 2020-07-20 (×2): 40 ug/kg/min via INTRAVENOUS
  Administered 2020-07-21: 50 ug/kg/min via INTRAVENOUS
  Administered 2020-07-21 (×2): 70 ug/kg/min via INTRAVENOUS
  Administered 2020-07-21: 60 ug/kg/min via INTRAVENOUS
  Administered 2020-07-21: 80 ug/kg/min via INTRAVENOUS
  Administered 2020-07-21: 70 ug/kg/min via INTRAVENOUS
  Administered 2020-07-21 (×2): 50 ug/kg/min via INTRAVENOUS
  Administered 2020-07-21: 70 ug/kg/min via INTRAVENOUS
  Administered 2020-07-21: 50 ug/kg/min via INTRAVENOUS
  Administered 2020-07-21: 70 ug/kg/min via INTRAVENOUS
  Administered 2020-07-21: 50 ug/kg/min via INTRAVENOUS
  Administered 2020-07-22: 70 ug/kg/min via INTRAVENOUS
  Administered 2020-07-22 (×4): 80 ug/kg/min via INTRAVENOUS
  Administered 2020-07-22: 70 ug/kg/min via INTRAVENOUS
  Administered 2020-07-22 (×2): 80 ug/kg/min via INTRAVENOUS
  Administered 2020-07-22: 70 ug/kg/min via INTRAVENOUS
  Administered 2020-07-22 (×3): 80 ug/kg/min via INTRAVENOUS
  Administered 2020-07-23: 60 ug/kg/min via INTRAVENOUS
  Administered 2020-07-23 (×5): 80 ug/kg/min via INTRAVENOUS
  Filled 2020-07-18 (×2): qty 100
  Filled 2020-07-18: qty 200
  Filled 2020-07-18 (×13): qty 100
  Filled 2020-07-18: qty 200
  Filled 2020-07-18 (×25): qty 100
  Filled 2020-07-18: qty 200
  Filled 2020-07-18: qty 100

## 2020-07-18 MED ORDER — QUETIAPINE FUMARATE 25 MG PO TABS
50.0000 mg | ORAL_TABLET | Freq: Two times a day (BID) | ORAL | Status: DC
  Administered 2020-07-18 – 2020-07-20 (×5): 50 mg
  Filled 2020-07-18 (×5): qty 2

## 2020-07-18 MED ORDER — SODIUM CHLORIDE 0.9 % IV SOLN
250.0000 mL | INTRAVENOUS | Status: DC
  Administered 2020-07-18 – 2020-07-26 (×7): 250 mL via INTRAVENOUS

## 2020-07-18 MED ORDER — LORAZEPAM 2 MG/ML IJ SOLN
2.0000 mg | Freq: Once | INTRAMUSCULAR | Status: AC
  Administered 2020-07-18: 2 mg via INTRAVENOUS
  Filled 2020-07-18: qty 1

## 2020-07-18 MED ORDER — PROSOURCE TF PO LIQD
90.0000 mL | Freq: Three times a day (TID) | ORAL | Status: DC
  Administered 2020-07-18 – 2020-07-23 (×14): 90 mL
  Filled 2020-07-18 (×19): qty 90

## 2020-07-18 MED ORDER — PROPOFOL 1000 MG/100ML IV EMUL: 5.0000 ug/kg/min | INTRAVENOUS | Status: DC

## 2020-07-18 MED ORDER — HALOPERIDOL LACTATE 5 MG/ML IJ SOLN
2.0000 mg | Freq: Once | INTRAMUSCULAR | Status: AC
  Administered 2020-07-18: 2 mg via INTRAVENOUS
  Filled 2020-07-18: qty 1

## 2020-07-18 MED ORDER — VITAL 1.5 CAL PO LIQD
1000.0000 mL | ORAL | Status: DC
  Administered 2020-07-19 – 2020-07-20 (×3): 1000 mL

## 2020-07-18 MED ORDER — PHENYLEPHRINE HCL-NACL 10-0.9 MG/250ML-% IV SOLN
25.0000 ug/min | INTRAVENOUS | Status: DC
  Administered 2020-07-18: 25 ug/min via INTRAVENOUS
  Administered 2020-07-19: 35 ug/min via INTRAVENOUS
  Filled 2020-07-18 (×2): qty 250

## 2020-07-18 NOTE — Consult Note (Signed)
  Psychiatry: Came by to see patient again.  Patient was being treated by respiratory therapy.  Nurse tech and respiratory therapist both told me that his mentation was no better today than it had been.  Still not verbal or able to communicate or interact lucidly.  No further attempts at this time to interact with him no change to treatment.  I will follow-up regularly to see if his mental status is improving

## 2020-07-18 NOTE — Progress Notes (Signed)
PHARMACY CONSULT NOTE  Pharmacy Consult for Electrolyte Monitoring and Replacement   Recent Labs: Potassium (mmol/L)  Date Value  07/18/2020 4.6  02/12/2013 3.9   Magnesium (mg/dL)  Date Value  03/83/3383 2.1   Calcium (mg/dL)  Date Value  29/19/1660 8.9   Calcium, Total (mg/dL)  Date Value  60/11/5995 8.7   Albumin (g/dL)  Date Value  74/14/2395 2.3 (L)  02/12/2013 3.6   Phosphorus (mg/dL)  Date Value  32/09/3341 5.1 (H)   Sodium (mmol/L)  Date Value  07/18/2020 138  02/12/2013 139   Assessment: 39 year old male here with encephalopathy s/t alcohol withdrawal. Patient has been intubated and sedated since 11/16. 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 Pharmacy to manage electrolytes.  Tube feeds: Vital 1.5 Cal at 60 mL/hr  + PROSource TF 45 mL 5 times daily per tube  Free water: 200 mL every 4 hours (d/c 12/2)  Diuretics: IV Lasix 40 mg daily (started 12/1, d/c 12/9)  Bowel regimen: Lactulose 20 g BID (d/c 12/2)   Goal of Therapy:  Electrolytes WNL  Plan:   No electrolyte replacement warranted today  Continue to follow along  Pricilla Riffle, PharmD 07/18/2020 12:40 PM

## 2020-07-18 NOTE — Progress Notes (Addendum)
0800 Patient was cooperative until 0815 when he decided he would fling his legs off the bed. After repeatedly replacing legs in bed,patient became agitated. Given prn dose of Hydromorphone. Patient calmed briefly after bolus. 0900-1000 Legs placed back in bed several times and multiple times patient was suctioned for copious amounts of tan sputum. 1100 patient became extremely agitated and started swinging and kicking at the nurses. Given prn Ativan for agitation. He calmed for about 10 minutes and then started to swing again at nurses. Discussed with Dr. Belia Heman patients options. Verbal order obtained and patient placed in 4 point restraints. Ventilator changes made and patient calmed only slightly.  1300 After tiring self out fighting restaints patient calmed down. Leg restaints removed successfully. 1400 Sedation changed from Precedx to Propofol per order.

## 2020-07-18 NOTE — Progress Notes (Signed)
Patient became extremely agitated after his bed bath. Verbal de escalation and emotional support not working. Patient medicated with PRN medication with minimal result, increased Precidex and Dilaudid DTT with minimal effect. One RN, 2 Nurse techs and a Engineer, materials at bedside holding patient in bed. Patient kicking and swatting at staff, attempting to pull at NGT and Trach. Call placed to Harlon Ditty NP, updated and new orders received and carried out.

## 2020-07-18 NOTE — Progress Notes (Signed)
CRITICAL CARE NOTE 39 yo male significant history of ETOH and polysubstance abuse presented to the ED with altered mental status admitted to the ICU requiring precedex drip and ultimately intubation & mechanical ventilation.  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 Despite decreasing versed to 7 and completely stopping Propofol patient is only partially arousable. Will continue to wean sedation to RASS-0 with plan for SBT. 06/24/20- patient is being weaned off sedation as possible.  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  12/1- patient became severely aggitated, tachypneic, tachycardic and with heavy endotracheal secretions. He is febrile 12/2-Patient with extremely high tolerance to sedation have been unable to bring down dosing. Reviewed care plan with mother of patient [name] - plan for trache - ENT consult placed. 07/07/2020:Tracheostomy planned for 12/6, remains encephalopathic, does not follow commands, agitation when sedation is lightened. 07/08/2020:Moves all fours, still on vent, less agitated 12/6TRACH placed by ENT 12/8 wean sedation, assess for PS trials 12/8 ELIZABETHKINGIA SPECIES in TA started ABX 12/13 severe agitation 12/14 severe agitation   Procedures:  06/20/20 ETT >> 06/20/20 CVC 3 L >> will need PICC line 12/6 [facility]  Significant Diagnostic Tests:  06/19/20 CT head >> no acute intracranial  abnormality 06/20/20 US abdomen>>mild hepatic steatosis 06/21/20 Echocardiogram>>LVEF 65-70%, no wall motion abnormalities, no other abnormalities noted Micro Data:  06/19/20 COVID-19 >> negative 06/19/20 Influenza PCR A/B >> negative 06/20/20 MRSA PCR >> negative 06/20/20 tracheal aspirate >> negative  Antimicrobials:  06/21/20 Unasyn >>12/3 07/05/2020 fluconazole>>12/8   CC  Follow up resp failure  SUBJECTIVE Remains critically ill Prognosis is guarded S/p trach   BP (!) 98/56   Pulse 65   Temp 99.1 F (37.3 C) (Axillary)   Resp (!) 21   Ht 5' 10"  (1.778 m)   Wt (!) 142.2 kg   SpO2 100%   BMI 44.98 kg/m    I/O last 3 completed shifts: In: 2339.4 [I.V.:1176.4; NG/GT:1163] Out: 8101 [Urine:3110] No intake/output data recorded.  SpO2: 100 % FiO2 (%): 35 %  Estimated body mass index is 44.98 kg/m as calculated from the following:   Height as of this encounter: 5' 10"  (1.778 m).   Weight as of this encounter: 142.2 kg.   REVIEW OF SYSTEMS  PATIENT IS UNABLE TO PROVIDE COMPLETE REVIEW OF SYSTEM S DUE TO SEVERE CRITICAL ILLNESS AND ENCEPHALOPATHY       PHYSICAL EXAMINATION:  GENERAL:critically ill appearing, +resp distress NECK: Supple. No thyromegaly. No nodules. No JVD.  S/p trach PULMONARY: +rhonchi, +wheezing CARDIOVASCULAR: S1 and S2. Regular rate and rhythm. No murmurs, rubs, or gallops.  GASTROINTESTINAL: Soft, nontender, -distended. Positive bowel sounds.  MUSCULOSKELETAL: No swelling, clubbing, or edema.  NEUROLOGIC: obtunded SKIN:intact,warm,dry     CULTURE RESULTS   Recent Results (from the past 240 hour(s))  Culture, respiratory (non-expectorated)     Status: None (Preliminary result)   Collection Time: 07/10/20 10:53 AM   Specimen: Tracheal Aspirate; Respiratory  Result Value Ref Range Status   Specimen Description   Final  TRACHEAL ASPIRATE Performed at [facility], [address] [place], [place] [address]     Special Requests   Final    NONE Performed at [facility], [address] [place], [place] [address]    Gram Stain   Final    FEW WBC PRESENT, PREDOMINANTLY PMN RARE GRAM NEGATIVE RODS    Culture   Final    MODERATE ELIZABETHKINGIA MENINGOSEPTICA SUSCEPTIBILITIES TO FOLLOW Performed at [facility], [place] [address] [place], [place] [address]    Report Status PENDING  Incomplete  MRSA PCR Screening     Status: None   Collection Time: 07/10/20  1:44 PM   Specimen: Nasopharyngeal  Result Value Ref Range Status   MRSA by PCR NEGATIVE NEGATIVE Final    Comment:        The GeneXpert MRSA Assay (FDA approved for NASAL specimens only), is one component of a comprehensive MRSA colonization surveillance program. It is not intended to diagnose MRSA infection nor to guide or monitor treatment for MRSA infections. Performed at [facility], [address] [place], [place] [address]        Indwelling Urinary Catheter continued, requirement due to   Reason to continue Indwelling Urinary Catheter strict Intake/Output monitoring for hemodynamic instability         Ventilator continued, requirement due to severe respiratory failure   Ventilator Sedation RASS 0 to -2      ASSESSMENT AND PLAN SYNOPSIS  Acute Hypoxic Respiratory failure secondary to acute encephalopathy due to Alcohol withdrawal s/p trach +Acute Hypoxic Respiratory failure secondary to acute encephalopathy due to Alcohol withdrawal +ELIZABETHKINGIA SPECIES in [place] started ABX Pneumonia, failure to we4an from vent and severe agitation from ETOH abuse with signs and sympoms of brain damage at micro cellular level   Severe ACUTE Hypoxic and Hypercapnic Respiratory Failure s/p trach Wean to PS mode  -continue Mechanical Ventilator support -continue Bronchodilator Therapy -Wean Fio2 and PEEP as tolerated -VAP/VENT bundle implementation -will perform SAT/SBT when respiratory parameters  are met   ACUTE DIASTOLIC CARDIAC FAILURE-  -oxygen as needed -Lasix as tolerated   Morbid obesity,  Will certainly impact respiratory mechanics, ventilator weaning S/p trach   ACUTE KIDNEY INJURY/Renal Failure -continue Foley Catheter-assess need -Avoid nephrotoxic agents -Follow urine output, BMP -Ensure adequate renal perfusion, optimize oxygenation -Renal dose medications   NEUROLOGY Acute toxic metabolic encephalopathy Stop Valium and continue Pain meds Wean off all IV infusions if possible Will start Seroquel, Psych following     CARDIAC ICU monitoring  INFECTIOUS DISEASE -continue antibiotics as prescribed -follow up cultures    GI GI PROPHYLAXIS as indicated  NUTRITIONAL STATUS DIET-->TF's as tolerated Constipation protocol as indicated  ELECTROLYTES -follow labs as needed -replace as needed -pharmacy consultation and following   DVT/GI PRX ordered and assessed TRANSFUSIONS AS NEEDED MONITOR FSBS I Assessed the need for Labs I Assessed the need for Foley I Assessed the need for Central Venous Line Family Discussion when available I Assessed the need for Mobilization I made an Assessment of medications to be adjusted accordingly Safety Risk assessment completed  CASE DISCUSSED IN MULTIDISCIPLINARY ROUNDS WITH ICU TEAM    Critical Care Time devoted to patient care services described in this note is 45 minutes.   Overall, patient is critically ill, prognosis is guarded.    Corrin Parker, M.D.  [facility]  Medical Director [place] Director [facility] Cardio-Pulmonary Department

## 2020-07-18 NOTE — Progress Notes (Signed)
Patient very agitated, kicking his legs, throwing his legs over the side rails, trying to reach for trach and NGT. Patient tachycardiac, increased RR with guppy breathing. Not responding to verbal de escalation, or emotional support. Four staff members at beside attempting to keep patient safe in bed. Triturating patients propofol up, medicating with PRN medication, with little effect. Annabelle Harman NP aware.

## 2020-07-18 NOTE — Progress Notes (Addendum)
Nutrition Follow-up  DOCUMENTATION CODES:   Morbid obesity  INTERVENTION:  Initiate new goal TF regimen of Vital 1.5 Cal at 50 mL/hr (1200 mL goal daily volume) + PROSource TF 90 mL TID per tube. Provides 2040 kcal, 147 grams of protein, 912 mL H2O daily. With current propofol rate provides 2716 kcal daily.  NUTRITION DIAGNOSIS:   Inadequate oral intake related to inability to eat as evidenced by NPO status.  Ongoing.  GOAL:   Patient will meet greater than or equal to 90% of their needs  Met with TF regimen.  MONITOR:   Vent status,Labs,Weight trends,TF tolerance,I & O's  REASON FOR ASSESSMENT:   Ventilator,Consult Enteral/tube feeding initiation and management  ASSESSMENT:   39 year old male with PMHx of EtOH abuse, polysubstance abuse, hepatitis C admitted with AMS, acute encephalopathy, EtOH withdrawal, suspected aspiration PNA, AKI, sepsis.  11/16 intubated 12/6 s/p tracheostomy tube placement  Patient is currently intubated on ventilator support MV: 9.4 L/min Temp (24hrs), Avg:99.5 F (37.5 C), Min:99.1 F (37.3 C), Max:100 F (37.8 C)  Propofol: 25.6 ml/hr (676 kcal daily)  Medications reviewed and include: Colace, folic acid 1 mg daily per tube, Novolog 0-15 units Q4hrs, MVI daily, nystatin, Protonix, Miralax, thiamine 100 mg daily per tube, vitamin B12 1000 micrograms daily, propofol gtt now restarted.  Labs reviewed: CBG 131, BUN 22, Triglycerides 219.  I/O: 1910 mL UOP yesterday (0.6 mL/kg/hr)  Weight trend: 142.2 kg on 12/14; -8.7 kg from 11/18  Enteral Access: small-bore NGT placed 12/6; terminates in mid to distal stomach per abdominal x-ray 12/6  TF regimen: Vital 1.5 Cal at 60 mL/hr + PROSource TF 45 mL 5 times daily per tube  Discussed with RN and on rounds.  Diet Order:   Diet Order    None     EDUCATION NEEDS:   No education needs have been identified at this time  Skin:  Skin Assessment: Skin Integrity Issues: Skin Integrity  Issues:: Incisions Incisions: closed incision to neck  Last BM:  Unknown  Height:   Ht Readings from Last 1 Encounters:  07/18/20 _0  (1.778 m)   Weight:   Wt Readings from Last 1 Encounters:  07/18/20 (!) 142.2 kg   Ideal Body Weight:  75.5 kg  BMI:  Body mass index is 44.98 kg/m.  Estimated Nutritional Needs:   Kcal:  2653  Protein:  145-155 grams  Fluid:  >/= 2.2 L/day  Jacklynn Barnacle, MS, RD, LDN Pager number available on Amion

## 2020-07-19 LAB — GLUCOSE, CAPILLARY
Glucose-Capillary: 120 mg/dL — ABNORMAL HIGH (ref 70–99)
Glucose-Capillary: 122 mg/dL — ABNORMAL HIGH (ref 70–99)
Glucose-Capillary: 123 mg/dL — ABNORMAL HIGH (ref 70–99)
Glucose-Capillary: 127 mg/dL — ABNORMAL HIGH (ref 70–99)
Glucose-Capillary: 137 mg/dL — ABNORMAL HIGH (ref 70–99)

## 2020-07-19 LAB — MAGNESIUM: Magnesium: 2 mg/dL (ref 1.7–2.4)

## 2020-07-19 LAB — CBC WITH DIFFERENTIAL/PLATELET
Abs Immature Granulocytes: 0.3 10*3/uL — ABNORMAL HIGH (ref 0.00–0.07)
Basophils Absolute: 0.1 10*3/uL (ref 0.0–0.1)
Basophils Relative: 0 %
Eosinophils Absolute: 1.1 10*3/uL — ABNORMAL HIGH (ref 0.0–0.5)
Eosinophils Relative: 9 %
HCT: 21.5 % — ABNORMAL LOW (ref 39.0–52.0)
Hemoglobin: 7.5 g/dL — ABNORMAL LOW (ref 13.0–17.0)
Immature Granulocytes: 2 %
Lymphocytes Relative: 29 %
Lymphs Abs: 3.7 10*3/uL (ref 0.7–4.0)
MCH: 34.1 pg — ABNORMAL HIGH (ref 26.0–34.0)
MCHC: 34.9 g/dL (ref 30.0–36.0)
MCV: 97.7 fL (ref 80.0–100.0)
Monocytes Absolute: 1.7 10*3/uL — ABNORMAL HIGH (ref 0.1–1.0)
Monocytes Relative: 13 %
Neutro Abs: 6 10*3/uL (ref 1.7–7.7)
Neutrophils Relative %: 47 %
Platelets: 338 10*3/uL (ref 150–400)
RBC: 2.2 MIL/uL — ABNORMAL LOW (ref 4.22–5.81)
RDW: 14.2 % (ref 11.5–15.5)
WBC: 12.8 10*3/uL — ABNORMAL HIGH (ref 4.0–10.5)
nRBC: 0 % (ref 0.0–0.2)

## 2020-07-19 LAB — BASIC METABOLIC PANEL
Anion gap: 11 (ref 5–15)
BUN: 21 mg/dL — ABNORMAL HIGH (ref 6–20)
CO2: 27 mmol/L (ref 22–32)
Calcium: 8.8 mg/dL — ABNORMAL LOW (ref 8.9–10.3)
Chloride: 100 mmol/L (ref 98–111)
Creatinine, Ser: 0.96 mg/dL (ref 0.61–1.24)
GFR, Estimated: >60 mL/min (ref >60)
Glucose, Bld: 130 mg/dL — ABNORMAL HIGH (ref 70–99)
Potassium: 3.9 mmol/L (ref 3.5–5.1)
Sodium: 138 mmol/L (ref 135–145)

## 2020-07-19 LAB — PHOSPHORUS: Phosphorus: 6.6 mg/dL — ABNORMAL HIGH (ref 2.5–4.6)

## 2020-07-19 MED ORDER — OLANZAPINE 5 MG PO TABS
5.0000 mg | ORAL_TABLET | Freq: Four times a day (QID) | ORAL | Status: DC | PRN
  Administered 2020-07-23 – 2020-07-25 (×7): 5 mg
  Filled 2020-07-19 (×13): qty 1

## 2020-07-19 NOTE — Progress Notes (Signed)
PHARMACY CONSULT NOTE  Pharmacy Consult for Electrolyte Monitoring and Replacement   Recent Labs: Potassium (mmol/L)  Date Value  07/19/2020 3.9  02/12/2013 3.9   Magnesium (mg/dL)  Date Value  83/15/1761 2.0   Calcium (mg/dL)  Date Value  60/73/7106 8.8 (L)   Calcium, Total (mg/dL)  Date Value  26/94/8546 8.7   Albumin (g/dL)  Date Value  27/10/5007 2.3 (L)  02/12/2013 3.6   Phosphorus (mg/dL)  Date Value  38/18/2993 6.6 (H)   Sodium (mmol/L)  Date Value  07/19/2020 138  02/12/2013 139   Assessment: 39 year old male here with encephalopathy s/t alcohol withdrawal. Patient has been intubated and sedated since 11/16. 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 Pharmacy to manage electrolytes.  Tube feeds: Vital 1.5 Cal at 50 mL/hr  + PROSource TF 90 mL 3 times daily per tube  Free water: 200 mL every 4 hours (d/c 12/2)  Diuretics: IV Lasix 40 mg daily (started 12/1, d/c 12/9)  Bowel regimen: Lactulose 20 g BID (d/c 12/2). Colace 100 mg BID + Miralax 17 g daily (started 12/14   Goal of Therapy:  Electrolytes WNL  Plan:   No electrolyte replacement warranted today  Continue to monitor phosphorus as it is trending up  Will continue to follow electrolytes   Sharen Hones, PharmD, BCPS 07/19/2020 3:12 PM

## 2020-07-19 NOTE — TOC Progression Note (Signed)
Transition of Care [facility]) - Progression Note    Patient Details  Name: Nicholas Valdez MRN: [id number] Date of Birth: 01/09/1981  Transition of Care [facility]) CM/SW Contact  Marina Goodell Phone Number: [phone] 07/19/2020, 9:47 PM  Clinical Narrative:     Patient remains sedated and intubated.  Patient becomes severely agitate when sedation is slightly reduced.  CSW will begin LTC bed search.   Expected Discharge Plan: Home/Self Care Barriers to Discharge: Continued Medical Work up,Active Substance Use - Placement  Expected Discharge Plan and Services Expected Discharge Plan: Home/Self Care In-house Referral: Clinical Social Work   Post Acute Care Choice: IP Rehab Living arrangements for the past 2 months: Single Family Home                                       Social Determinants of Health (SDOH) Interventions    Readmission Risk Interventions Readmission Risk Prevention Plan 12/16/2018  Transportation Screening Complete  PCP or Specialist Appt within 5-7 Days Complete  Home Care Screening Complete  Medication Review (RN CM) Complete  Some recent data might be hidden

## 2020-07-19 NOTE — Progress Notes (Signed)
Prior to consideration for gastrostomy tube, IR recommends:  -CT abdomen for anatomic feasibility of percutaneous gastrostomy placement -recommend delaying placement until leukocytosis resolved -given recent severe agitation, it may be prudent to delay gastrostomy tube placement until risk of self extubation resulting in potentially catastrophic visceral injury is minimized  Please notify IR once CT is complete and leukocytosis is resolved and we can re-evaluate candidacy for gastrostomy tube placement at that time.   Marliss Coots, MD Pager: [phone]

## 2020-07-19 NOTE — Progress Notes (Signed)
NAME:  Nicholas Valdez, MRN:  [id number], DOB:  1981/07/02, LOS: 30 ADMISSION DATE:  06/19/2020,   Brief History   39 year old male with past medical history significant for EtOH and polysubstance abuse admitted with acute metabolic encephalopathy secondary to alcohol withdrawal and acute hypoxic respiratory failure due to encephalopathy and aspiration pneumonia.  Status post tracheostomy.    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 Despite decreasing versed to 7 and completely stopping Propofol patient is only partially arousable. Will continue to wean sedation to RASS-0 with plan for SBT. 06/24/20- patient is being weaned off sedation as possible.  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  12/1- patient became severely aggitated, tachypneic, tachycardic and with heavy endotracheal secretions. He is febrile 12/2-Patient with extremely high tolerance to sedation have been unable to bring down dosing. Reviewed care plan with mother of patient Nicholas Braun - plan for trache - ENT consult placed. 07/07/2020:Tracheostomy planned for 12/6, remains encephalopathic, does not follow commands, agitation when sedation is lightened. 07/08/2020:Moves all fours, still on vent, less agitated 12/6TRACH placed by ENT 12/8wean sedation, assess for PS trials 12/8 Nicholas Valdez in [place] started ABX 12/13 severe  agitation 12/14 severe agitation  Consults:  PCCM Psychiatry  Procedures:  11/16: ETT 11/16: CVC 3L>>will need PICC line 12/6: Tracheostomy placed by ENT  Significant Diagnostic Tests:  11/15: CT Head>> negative 11/16: US Abdomen>. Mild hepatic steatosis 11/17: Echocardiogram>> LVEF 65-70%, no wall motion abnormalities, no other abnormalities noted  Micro Data:  11/15: COVID-19 PCR>> negative 11/15: Influenza PCR>> negative 11/16: MRSA PCR>> negative 11/16: Tracheal aspirate>>negative 11/16: HIV screen>>nonreactive 11/18: Sputum>> no growth 11/21: Sputum>>rare normal respiratory flora 12/1: Sputum >> Normal respiratory flora 12/6: Tracheal aspirate>>Nicholas Valdez  12/6: MRSA PCR>>negative 12/14: Tracheal aspirate>> pending  Antimicrobials:  Unasyn 11/17>>11/23; 11/29>>12/3 Fluconazole 12/1>>12/8 Vancomycin 11/21 x1 dose    CC  follow up resp failure    HPI ongoing Deliirum, agitation, and Encephalopathy Trials of weaning sedation have failed repeatedly due to his sever agitation, climbing out of bed, difficult to control Patient with increased WOB  Objective   Blood pressure 108/77, pulse 93, temperature 100 F (37.8 C), temperature source Axillary, resp. rate 13, height 5\' 10"  (1.778 m), weight (!) 142 kg, SpO2 100 %.    Vent Mode: PSV FiO2 (%):  [35 %] 35 % Set Rate:  [20 bmp] 20 bmp PEEP:  [5 cmH20] 5 cmH20 Pressure Support:  [10 cmH20] 10 cmH20   Intake/Output Summary (Last 24 hours) at 07/19/2020 0857 Last data filed at 07/19/2020 0700 Gross per 24 hour  Intake 2145.48 ml  Output 2040 ml  Net 105.48 ml   Filed Weights   07/17/20 0500 07/18/20 0500 07/19/20 0500  Weight: (!) 142.6 kg (!) 142.2 kg (!) 142 kg    REVIEW OF SYSTEMS  PATIENT IS UNABLE TO PROVIDE COMPLETE REVIEW OF SYSTEM S DUE TO SEVERE CRITICAL ILLNESS AND ENCEPHALOPATHY  PHYSICAL EXAMINATION:  GENERAL:critically ill appearing, +resp distress HEAD:  Normocephalic, atraumatic.  EYES: Pupils equal, round, reactive to light.  No scleral icterus.  MOUTH: Moist mucosal membrane. NECK: Supple. No thyromegaly. No nodules. No JVD. S/p trach PULMONARY: +rhonchi, +wheezing CARDIOVASCULAR: S1 and S2. Regular rate and rhythm. No murmurs, rubs, or gallops.  GASTROINTESTINAL: Soft, nontender, -distended. Positive bowel sounds.  MUSCULOSKELETAL: No swelling, clubbing, or edema.  NEUROLOGIC: obtunded SKIN:intact,warm,dry   Assessment & Plan:    Acute Hypoxic Respiratory failure secondary to acute encephalopathy due to Alcohol withdrawal s/p trach +Acute Hypoxic Respiratory failure secondary to acute encephalopathy due to Alcohol withdrawal +Nicholas Valdez in TA started ABX Pneumonia, failure to we4an from vent and severe agitation from ETOH abuse with signs and sympoms of brain damage at micro cellular level   Acute Hypoxic Respiratory Failure secondary to Acute Encephalopathy due to ETOH withdrawal and Aspiration Pneumonia -Full vent support -Wean FiO2 & PEEP as tolerated to maintain O2 sats >92% -PS mode/Trach collar trials as tolerated -Follow intermittent CXR & ABG as needed   Aspiration Pneumonia>>treated with Unasyn Ventilator Associated Pneumonia (12/6: Tracheal aspirate>>Nicholas Valdez) -Monitor fever curve -Trend WBC's -Follow cultures as above -Repeat Tracheal aspirate on 12/14 is pending   Acute Toxic Metabolic Encephalopathy -Provide provide care -Currently on Propofol and Dilaudid gtt's ~ wean as tolerated -Scheduled Oxycodone and Valium -Seroquel 50 mg BID -Psych consulted, appreciate input   Anemia without s/sx of bleeding -Monitor for S/Sx of bleeding -Trend CBC -Lovenox for VTE Prophylaxis  -Transfuse for Hgb <7   Hyperglycemia -CBG's -SSI -Follow ICU Hypo/hyperglycemia protocol    GI GI PROPHYLAXIS as indicated  NUTRITIONAL STATUS DIET-->TF's as tolerated Constipation  protocol as indicated Patient will need feeding tube PEG tube IR consulted  DVT/GI PRX ordered and assessed TRANSFUSIONS AS NEEDED MONITOR FSBS I Assessed the need for Labs I Assessed the need for Foley I Assessed the need for Central Venous Line Family Discussion when available I Assessed the need for Mobilization I made an Assessment of medications to be adjusted accordingly Safety Risk assessment completed  CASE DISCUSSED IN MULTIDISCIPLINARY ROUNDS WITH ICU TEAM     Critical Care Time devoted to patient care services described in this note is 47  minutes.   Overall, patient is critically ill, prognosis is guarded.      Lucie Leather, M.D.  [facility]  Medical Director [facility] Medical Director [facility] Cardio-Pulmonary Department

## 2020-07-20 ENCOUNTER — Inpatient Hospital Stay: Payer: Medicaid Other

## 2020-07-20 LAB — CBC WITH DIFFERENTIAL/PLATELET
Abs Immature Granulocytes: 0.13 10*3/uL — ABNORMAL HIGH (ref 0.00–0.07)
Basophils Absolute: 0 10*3/uL (ref 0.0–0.1)
Basophils Relative: 1 %
Eosinophils Absolute: 1 10*3/uL — ABNORMAL HIGH (ref 0.0–0.5)
Eosinophils Relative: 12 %
HCT: 25.1 % — ABNORMAL LOW (ref 39.0–52.0)
Hemoglobin: 8.8 g/dL — ABNORMAL LOW (ref 13.0–17.0)
Immature Granulocytes: 2 %
Lymphocytes Relative: 18 %
Lymphs Abs: 1.4 10*3/uL (ref 0.7–4.0)
MCH: 35.2 pg — ABNORMAL HIGH (ref 26.0–34.0)
MCHC: 35.1 g/dL (ref 30.0–36.0)
MCV: 100.4 fL — ABNORMAL HIGH (ref 80.0–100.0)
Monocytes Absolute: 0.9 10*3/uL (ref 0.1–1.0)
Monocytes Relative: 12 %
Neutro Abs: 4.4 10*3/uL (ref 1.7–7.7)
Neutrophils Relative %: 55 %
Platelets: 299 10*3/uL (ref 150–400)
RBC: 2.5 MIL/uL — ABNORMAL LOW (ref 4.22–5.81)
RDW: 14.1 % (ref 11.5–15.5)
WBC: 7.9 10*3/uL (ref 4.0–10.5)
nRBC: 0 % (ref 0.0–0.2)

## 2020-07-20 LAB — GLUCOSE, CAPILLARY
Glucose-Capillary: 100 mg/dL — ABNORMAL HIGH (ref 70–99)
Glucose-Capillary: 111 mg/dL — ABNORMAL HIGH (ref 70–99)
Glucose-Capillary: 117 mg/dL — ABNORMAL HIGH (ref 70–99)
Glucose-Capillary: 120 mg/dL — ABNORMAL HIGH (ref 70–99)
Glucose-Capillary: 126 mg/dL — ABNORMAL HIGH (ref 70–99)
Glucose-Capillary: 154 mg/dL — ABNORMAL HIGH (ref 70–99)

## 2020-07-20 LAB — PHOSPHORUS: Phosphorus: 5.5 mg/dL — ABNORMAL HIGH (ref 2.5–4.6)

## 2020-07-20 LAB — BASIC METABOLIC PANEL
Anion gap: 8 (ref 5–15)
BUN: 12 mg/dL (ref 6–20)
CO2: 30 mmol/L (ref 22–32)
Calcium: 8.7 mg/dL — ABNORMAL LOW (ref 8.9–10.3)
Chloride: 99 mmol/L (ref 98–111)
Creatinine, Ser: 0.79 mg/dL (ref 0.61–1.24)
GFR, Estimated: >60 mL/min (ref >60)
Glucose, Bld: 151 mg/dL — ABNORMAL HIGH (ref 70–99)
Potassium: 3.4 mmol/L — ABNORMAL LOW (ref 3.5–5.1)
Sodium: 137 mmol/L (ref 135–145)

## 2020-07-20 LAB — MAGNESIUM: Magnesium: 2.1 mg/dL (ref 1.7–2.4)

## 2020-07-20 MED ORDER — QUETIAPINE FUMARATE 100 MG PO TABS
100.0000 mg | ORAL_TABLET | Freq: Two times a day (BID) | ORAL | Status: DC
  Administered 2020-07-20 – 2020-07-30 (×20): 100 mg
  Filled 2020-07-20 (×6): qty 1
  Filled 2020-07-20: qty 4
  Filled 2020-07-20 (×4): qty 1
  Filled 2020-07-20: qty 4
  Filled 2020-07-20 (×2): qty 1
  Filled 2020-07-20: qty 4
  Filled 2020-07-20 (×5): qty 1

## 2020-07-20 MED ORDER — POTASSIUM CHLORIDE 20 MEQ PO PACK
40.0000 meq | PACK | Freq: Once | ORAL | Status: AC
  Administered 2020-07-20: 40 meq
  Filled 2020-07-20: qty 2

## 2020-07-20 MED ORDER — HYDROMORPHONE HCL 1 MG/ML IJ SOLN
1.0000 mg | INTRAMUSCULAR | Status: DC | PRN
  Administered 2020-07-21 – 2020-08-04 (×18): 1 mg via INTRAVENOUS
  Filled 2020-07-20 (×21): qty 1

## 2020-07-20 MED ORDER — FENTANYL CITRATE (PF) 100 MCG/2ML IJ SOLN: 100.0000 ug | Freq: Once | INTRAMUSCULAR | Status: AC

## 2020-07-20 MED ORDER — FENTANYL 100 MCG/HR TD PT72
1.0000 | MEDICATED_PATCH | TRANSDERMAL | Status: DC
  Administered 2020-07-20 – 2020-08-01 (×5): 1 via TRANSDERMAL
  Filled 2020-07-20 (×6): qty 1

## 2020-07-20 MED ORDER — FENTANYL CITRATE (PF) 100 MCG/2ML IJ SOLN
INTRAMUSCULAR | Status: AC
  Administered 2020-07-20: 100 ug via INTRAVENOUS
  Filled 2020-07-20: qty 2

## 2020-07-20 NOTE — Progress Notes (Signed)
To CT scan with RT on portable vent and CM, tolerated scan well.

## 2020-07-20 NOTE — Plan of Care (Signed)
Patient remains vented and sedated. No subjective clinical improvement.     Problem: Education: Goal: Knowledge of General Education information will improve Description: Including pain rating scale, medication(s)/side effects and non-pharmacologic comfort measures Outcome: Not Progressing   Problem: Health Behavior/Discharge Planning: Goal: Ability to manage health-related needs will improve Outcome: Not Progressing   Problem: Clinical Measurements: Goal: Ability to maintain clinical measurements within normal limits will improve Outcome: Not Progressing Goal: Will remain free from infection Outcome: Not Progressing Goal: Diagnostic test results will improve Outcome: Not Progressing Goal: Respiratory complications will improve Outcome: Not Progressing Goal: Cardiovascular complication will be avoided Outcome: Not Progressing   Problem: Activity: Goal: Risk for activity intolerance will decrease Outcome: Not Progressing   Problem: Nutrition: Goal: Adequate nutrition will be maintained Outcome: Not Progressing   Problem: Coping: Goal: Level of anxiety will decrease Outcome: Not Progressing   Problem: Elimination: Goal: Will not experience complications related to bowel motility Outcome: Not Progressing Goal: Will not experience complications related to urinary retention Outcome: Not Progressing   Problem: Pain Managment: Goal: General experience of comfort will improve Outcome: Not Progressing   Problem: Safety: Goal: Ability to remain free from injury will improve Outcome: Not Progressing   Problem: Skin Integrity: Goal: Risk for impaired skin integrity will decrease Outcome: Not Progressing

## 2020-07-20 NOTE — Progress Notes (Signed)
PHARMACY CONSULT NOTE  Pharmacy Consult for Electrolyte Monitoring and Replacement   Recent Labs: Potassium (mmol/L)  Date Value  07/20/2020 3.4 (L)  02/12/2013 3.9   Magnesium (mg/dL)  Date Value  50/56/9794 2.1   Calcium (mg/dL)  Date Value  80/16/5537 8.7 (L)   Calcium, Total (mg/dL)  Date Value  48/27/0786 8.7   Albumin (g/dL)  Date Value  75/44/9201 2.3 (L)  02/12/2013 3.6   Phosphorus (mg/dL)  Date Value  00/71/2197 5.5 (H)   Sodium (mmol/L)  Date Value  07/20/2020 137  02/12/2013 139   Assessment: 39 year old male here with encephalopathy s/t alcohol withdrawal. Patient has been intubated and sedated since 11/16. 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 Pharmacy to manage electrolytes.  Tube feeds: Vital 1.5 Cal at 50 mL/hr  + PROSource TF 90 mL 3 times daily per tube  Free water: 200 mL every 4 hours (d/c 12/2)  Diuretics: IV Lasix 40 mg daily (started 12/1, d/c 12/9)  Bowel regimen: Lactulose 20 g BID (d/c 12/2). Colace 100 mg BID + Miralax 17 g daily (started 12/14   Goal of Therapy:  Electrolytes WNL  Plan:   Oral KCL 40 mEq x 1 ordered for today  Will continue to follow electrolytes   Sharen Hones, PharmD, BCPS 07/20/2020 7:35 AM

## 2020-07-20 NOTE — Progress Notes (Signed)
NAME:  Nicholas Valdez, MRN:  [id number], DOB:  02/09/1981, LOS: 31 ADMISSION DATE:  06/19/2020,   Brief History   39 year old male with past medical history significant for EtOH and polysubstance abuse admitted with acute metabolic encephalopathy secondary to alcohol withdrawal and acute hypoxic respiratory failure due to encephalopathy and aspiration pneumonia.  Status post tracheostomy.  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 Despite decreasing versed to 7 and completely stopping Propofol patient is only partially arousable. Will continue to wean sedation to RASS-0 with plan for SBT. 06/24/20- patient is being weaned off sedation as possible.  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  12/1- patient became severely aggitated, tachypneic, tachycardic and with heavy endotracheal secretions. He is febrile 12/2-Patient with extremely high tolerance to sedation have been unable to bring down dosing. Reviewed care plan with mother of patient Nicholas Valdez - plan for trache - ENT consult placed. 07/07/2020:Tracheostomy planned for 12/6, remains encephalopathic, does not follow commands, agitation when sedation is lightened. 07/08/2020:Moves all fours, still on vent, less agitated 12/6TRACH placed by ENT 12/8wean sedation, assess for PS trials 12/8 Nicholas SPECIES in TA started ABX 12/13 severe agitation 12/14  severe agitation 12/16: Weaning sedation as able, will plan on PS/trach collar trials  Consults:  PCCM Psychiatry IR  Procedures:  11/16: ETT 11/16: CVC 3L>>will need PICC line 12/6: Tracheostomy placed by ENT  Significant Diagnostic Tests:  11/15: CT Head>> negative 11/16: US Abdomen>>. Mild hepatic steatosis 11/17: Echocardiogram>> LVEF 65-70%, no wall motion abnormalities, no other abnormalities noted 12/16: CT Abdomen/Pelvis>>Bibasilar pulmonary infiltrates, likely infectious or inflammatory and suggestive of atypical infection or aspiration. Mild hepatosplenomegaly. Nasogastric tube extends into the mid to distal body of the stomach. In this region, there is an appropriate tract for potential percutaneous access of the stomach.  Micro Data:  11/15: COVID-19 PCR>> negative 11/15: Influenza PCR>> negative 11/16: MRSA PCR>> negative 11/16: Tracheal aspirate>>negative 11/16: HIV screen>>nonreactive 11/18: Sputum>> no growth 11/21: Sputum>>rare normal respiratory flora 12/1: Sputum >> Normal respiratory flora 12/6: Tracheal aspirate>>Nicholas Valdez  12/6: MRSA PCR>>negative 12/14: Tracheal aspirate>> pending  Antimicrobials:  Unasyn 11/17>>11/23; 11/29>>12/3 Vancomycin 11/21 x1 dose Fluconazole 12/1>>12/8 Minocycline 12/8>>12/14   CC  follow up resp failure  INTERVAL HISTORY Was restarted on Propofol and Dilaudid yesterday due to severe agitation, climbing out of bed, difficult to control, and increased WOB No events reported overnight per Nursing Currently weaning sedation for Neuro exam, will intermittently follow commands Afebrile, Hemodynamically stable Plan for PS/Trach collar trials CT Abdomen/Pelvis obtained yesterday for assessment of anatomy for PEG placement, IR consulted    Objective   Blood pressure 112/68, pulse 77, temperature (!) 97 F (36.1 C), temperature source Axillary, resp. rate (!) 24, height 5\' 10"  (1.778 m), weight (!)  145 kg, SpO2 100 %.    Vent Mode: PCV FiO2 (%):  [35 %] 35 % Set Rate:  [12 bmp-20 bmp] 12 bmp PEEP:  [5 cmH20] 5 cmH20 Pressure Support:  [10 cmH20] 10 cmH20 Plateau Pressure:  [  15 cmH20-18 cmH20] 18 cmH20   Intake/Output Summary (Last 24 hours) at 07/20/2020 0925 Last data filed at 07/20/2020 0800 Gross per 24 hour  Intake 2192.23 ml  Output 1845 ml  Net 347.23 ml   Filed Weights   07/18/20 0500 07/19/20 0500 07/20/20 0411  Weight: (!) 142.2 kg (!) 142 kg (!) 145 kg    REVIEW OF SYSTEMS  PATIENT IS UNABLE TO PROVIDE COMPLETE REVIEW OF SYSTEM S DUE TO SEVERE CRITICAL ILLNESS AND ENCEPHALOPATHY  PHYSICAL EXAMINATION:  GENERAL:Acutely ill appearing male, sitting in bed, sedation now off, lethargic, in NAD HEAD: Atraumatic, normocephalic EYES: Pupils PERRLA, No scleral icterus  MOUTH: Moist mucus membranes NECK: Supple, no JVD, no thyromegaly.  Tracheostomy midline  PULMONARY: Coarse rhonchi bilaterally, vent assisted, even CARDIOVASCULAR: Tachycardia, regular rhythm, s1s2, no M/R/G, 2+ distal pulses GASTROINTESTINAL: Obese, soft, nontender, nondistended, no guarding or rebound tenderness.  BS+ x4 MUSCULOSKELETAL: Generalized weakness, no deformities.  No clubbing or edema NEUROLOGIC: Lethargic (sedation recently stopped), arouses to voice, will intermittently follow commands, pupils PERRL SKIN: Warm and dry.  No obvious rashes, lesions, or ulcerations   Assessment & Plan:    Acute Hypoxic Respiratory failure secondary to acute encephalopathy due to Alcohol withdrawal s/p trach +Acute Hypoxic Respiratory failure secondary to acute encephalopathy due to Alcohol withdrawal +Nicholas SPECIES in TA started ABX Pneumonia, failure to we4an from vent and severe agitation from ETOH abuse with signs and sympoms of brain damage at micro cellular level    Ventilator Dependent Respiratory Failure secondary to Acute Encephalopathy due to ETOH withdrawal and Aspiration  Pneumonia -Full vent support -Wean FiO2 & PEEP as tolerated to maintain O2 sats >92% -S/p Tracheostomy -Pressure Support/Trach collar trials as tolerated -VAP Protocol -Prn Bronchodilators   Aspiration Pneumonia>>treated with Unasyn Ventilator Associated Pneumonia (12/6: Tracheal aspirate>>Nicholas Valdez)>>treated with Minocycline -Monitor fever curve -Trend WBC's -Follow cultures as above -Repeat tracheal aspirate on 12/14 is pending -Completed course of Minocycline on 12/14   Acute Toxic Metabolic Encephalopathy -Provide supportive care -Currently on Propofol and Dilaudid gtt's ~ wean as tolerated -Scheduled Oxycodone and Valium -Add Fentanyl patch and increase Seroquel dose to assist with weaning sedation -Psych following, appreciate input   Anemia without s/sx of bleeding -Monitor for S/Sx of bleeding -Trend CBC -Lovenox for VTE Prophylaxis  -Transfuse for Hgb <7   Hyperglycemia -CBG's -SSI -Follow ICU Hypo/hyperglycemia protocol   GI -GI PROPHYLAXIS as indicated   NUTRITIONAL STATUS/DIET -Dietician following, TF's as tolerated -Constipation protocol as indicated -Patient will need feeding tube PEG tube -IR consulted        BEST PRACTICES DISPOSITION: ICU GOALS OF CARE: Full Code VTE PROPHYLAXIS: Lovenox SUP: Protonix UPDATES: Updated pt's mother Nicholas Valdez via telephone 07/20/20   Harlon Ditty, AGACNP-BC [facility] Pager: [phone]

## 2020-07-21 ENCOUNTER — Inpatient Hospital Stay: Payer: Medicaid Other

## 2020-07-21 DIAGNOSIS — J189 Pneumonia, unspecified organism: Secondary | ICD-10-CM

## 2020-07-21 DIAGNOSIS — G312 Degeneration of nervous system due to alcohol: Secondary | ICD-10-CM

## 2020-07-21 DIAGNOSIS — Z9911 Dependence on respirator [ventilator] status: Secondary | ICD-10-CM

## 2020-07-21 DIAGNOSIS — G9340 Encephalopathy, unspecified: Secondary | ICD-10-CM

## 2020-07-21 DIAGNOSIS — F10139 Alcohol abuse with withdrawal, unspecified: Secondary | ICD-10-CM

## 2020-07-21 DIAGNOSIS — J9690 Respiratory failure, unspecified, unspecified whether with hypoxia or hypercapnia: Secondary | ICD-10-CM | POA: Diagnosis not present

## 2020-07-21 DIAGNOSIS — J690 Pneumonitis due to inhalation of food and vomit: Secondary | ICD-10-CM

## 2020-07-21 LAB — PHOSPHORUS: Phosphorus: 5.3 mg/dL — ABNORMAL HIGH (ref 2.5–4.6)

## 2020-07-21 LAB — COMPREHENSIVE METABOLIC PANEL
ALT: 24 U/L (ref 0–44)
AST: 25 U/L (ref 15–41)
Albumin: 3.2 g/dL — ABNORMAL LOW (ref 3.5–5.0)
Alkaline Phosphatase: 50 U/L (ref 38–126)
Anion gap: 13 (ref 5–15)
BUN: 15 mg/dL (ref 6–20)
CO2: 27 mmol/L (ref 22–32)
Calcium: 9.3 mg/dL (ref 8.9–10.3)
Chloride: 99 mmol/L (ref 98–111)
Creatinine, Ser: 0.69 mg/dL (ref 0.61–1.24)
GFR, Estimated: >60 mL/min (ref >60)
Glucose, Bld: 153 mg/dL — ABNORMAL HIGH (ref 70–99)
Potassium: 4.8 mmol/L (ref 3.5–5.1)
Sodium: 139 mmol/L (ref 135–145)
Total Bilirubin: 0.5 mg/dL (ref 0.3–1.2)
Total Protein: 7.4 g/dL (ref 6.5–8.1)

## 2020-07-21 LAB — CBC WITH DIFFERENTIAL/PLATELET
Abs Immature Granulocytes: 0.2 10*3/uL — ABNORMAL HIGH (ref 0.00–0.07)
Basophils Absolute: 0.1 10*3/uL (ref 0.0–0.1)
Basophils Relative: 1 %
Eosinophils Absolute: 1 10*3/uL — ABNORMAL HIGH (ref 0.0–0.5)
Eosinophils Relative: 10 %
HCT: 28.8 % — ABNORMAL LOW (ref 39.0–52.0)
Hemoglobin: 9.2 g/dL — ABNORMAL LOW (ref 13.0–17.0)
Immature Granulocytes: 2 %
Lymphocytes Relative: 12 %
Lymphs Abs: 1.2 10*3/uL (ref 0.7–4.0)
MCH: 33.9 pg (ref 26.0–34.0)
MCHC: 31.9 g/dL (ref 30.0–36.0)
MCV: 106.3 fL — ABNORMAL HIGH (ref 80.0–100.0)
Monocytes Absolute: 1.1 10*3/uL — ABNORMAL HIGH (ref 0.1–1.0)
Monocytes Relative: 10 %
Neutro Abs: 6.9 10*3/uL (ref 1.7–7.7)
Neutrophils Relative %: 65 %
Platelets: 315 10*3/uL (ref 150–400)
RBC: 2.71 MIL/uL — ABNORMAL LOW (ref 4.22–5.81)
RDW: 14.1 % (ref 11.5–15.5)
WBC: 10.5 10*3/uL (ref 4.0–10.5)
nRBC: 0.2 % (ref 0.0–0.2)

## 2020-07-21 LAB — TRIGLYCERIDES: Triglycerides: 242 mg/dL — ABNORMAL HIGH (ref <150)

## 2020-07-21 LAB — GLUCOSE, CAPILLARY
Glucose-Capillary: 105 mg/dL — ABNORMAL HIGH (ref 70–99)
Glucose-Capillary: 119 mg/dL — ABNORMAL HIGH (ref 70–99)
Glucose-Capillary: 133 mg/dL — ABNORMAL HIGH (ref 70–99)
Glucose-Capillary: 139 mg/dL — ABNORMAL HIGH (ref 70–99)
Glucose-Capillary: 149 mg/dL — ABNORMAL HIGH (ref 70–99)
Glucose-Capillary: 165 mg/dL — ABNORMAL HIGH (ref 70–99)

## 2020-07-21 LAB — PROTIME-INR
INR: 0.9 (ref 0.8–1.2)
Prothrombin Time: 12.2 seconds (ref 11.4–15.2)

## 2020-07-21 LAB — MAGNESIUM: Magnesium: 2 mg/dL (ref 1.7–2.4)

## 2020-07-21 MED ORDER — CIPROFLOXACIN IN D5W 400 MG/200ML IV SOLN
400.0000 mg | Freq: Two times a day (BID) | INTRAVENOUS | Status: DC
  Administered 2020-07-21 – 2020-07-27 (×12): 400 mg via INTRAVENOUS
  Filled 2020-07-21 (×13): qty 200

## 2020-07-21 NOTE — Progress Notes (Cosign Needed)
NAME:  Nicholas Valdez, MRN:  [id number], DOB:  [birth year], LOS: 32 ADMISSION DATE:  06/19/2020,   Brief History   39 year old male with past medical history significant for EtOH and polysubstance abuse admitted with acute metabolic encephalopathy secondary to alcohol withdrawal and acute hypoxic respiratory failure due to encephalopathy and aspiration pneumonia.  Status post tracheostomy.  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 Despite decreasing versed to 7 and completely stopping Propofol patient is only partially arousable. Will continue to wean sedation to RASS-0 with plan for SBT. 06/24/20- patient is being weaned off sedation as possible.  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  12/1- patient became severely aggitated, tachypneic, tachycardic and with heavy endotracheal secretions. He is febrile 12/2-Patient with extremely high tolerance to sedation have been unable to bring down dosing. Reviewed care plan with mother of patient Clydie Braun - plan for trache - ENT consult placed. 07/07/2020:Tracheostomy planned for 12/6, remains encephalopathic, does not follow commands, agitation when sedation is lightened. 07/08/2020:Moves all fours, still on vent, less agitated 12/6TRACH placed by ENT 12/8wean sedation, assess for PS trials 12/8 ELIZABETHKINGIA SPECIES in TA started ABX 12/13 severe agitation 12/14  severe agitation 12/16: Weaning sedation as able, will plan on PS/trach collar trials 12/17: Repeat Tracheal aspirate w/ ELIZABETHKINGIA SPECIES, ID Consulted, General Surgery consulted for PEG  Consults:  PCCM Psychiatry IR Infectious disease General surgery  Procedures:  11/16: ETT 11/16: CVC 3L>>will need PICC line 12/6: Tracheostomy placed by ENT  Significant Diagnostic Tests:  11/15: CT Head>> negative 11/16: US Abdomen>>. Mild hepatic steatosis 11/17: Echocardiogram>> LVEF 65-70%, no wall motion abnormalities, no other abnormalities noted 12/16: CT Abdomen/Pelvis>>Bibasilar pulmonary infiltrates, likely infectious or inflammatory and suggestive of atypical infection or aspiration. Mild hepatosplenomegaly. Nasogastric tube extends into the mid to distal body of the stomach. In this region, there is an appropriate tract for potential percutaneous access of the stomach. 12/17: CT head>>1. No intracranial abnormality. 2. Bilateral mastoid effusions and fluid in the middle ears. 3. Mucosal inflammatory changes of the paranasal sinuses. 12/17: CT chest>>  Micro Data:  11/15: COVID-19 PCR>> negative 11/15: Influenza PCR>> negative 11/16: MRSA PCR>> negative 11/16: Tracheal aspirate>>negative 11/16: HIV screen>>nonreactive 11/18: Sputum>> no growth 11/21: Sputum>>rare normal respiratory flora 12/1: Sputum >> Normal respiratory flora 12/6: Tracheal aspirate>>ELIZABETHKINGIA MENINGOSEPTICA  12/6: MRSA PCR>>negative 12/14: Tracheal aspirate>> FEW ELIZABETHKINGIA MIRICOLA (susceptiblities to follow)  Antimicrobials:  Unasyn 11/17>>11/23; 11/29>>12/3 Vancomycin 11/21 x1 dose Fluconazole 12/1>>12/8 Minocycline 12/8>>12/14   CC  follow up resp failure  INTERVAL HISTORY No events reported overnight On propofol drip, Dilaudid drip currently weaned off Tracheal aspirate from 12/14 with ELIZABETHKINGIA MIRICOLA (susceptibilities pending) Consult infectious  disease General surgery consulted for PEG tube placement     Objective   Blood pressure 130/80, pulse 99, temperature 99.2 F (37.3 C), temperature source Axillary, resp. rate (!) 22, height 5\' 10"  (1.778 m), weight (!) 144.6 kg, SpO2 99 %.    Vent Mode: PCV FiO2 (%):  [35 %] 35 %  Set Rate:  [12 bmp-20 bmp] 20 bmp Vt Set:  [550 mL] 550 mL PEEP:  [5 cmH20] 5 cmH20 Pressure Support:  [10 cmH20] 10 cmH20   Intake/Output Summary (Last 24 hours) at 07/21/2020 0843 Last data filed at 07/21/2020 0800 Gross per 24 hour  Intake 2693.36 ml  Output 1560 ml  Net 1133.36 ml   Filed Weights   07/19/20 0500 07/20/20 0411 07/21/20 0349  Weight: (!) 142 kg (!) 145 kg (!) 144.6 kg    REVIEW OF SYSTEMS  PATIENT IS UNABLE TO PROVIDE COMPLETE REVIEW OF SYSTEM S DUE TO SEVERE CRITICAL ILLNESS AND ENCEPHALOPATHY  PHYSICAL EXAMINATION:  GENERAL: Acutely ill appearing male, laying in bed, currently sedated, in NAD  HEAD: Atraumatic, normocephalic EYES: Pupils PERRL, No scleral icterus  MOUTH: Moist mucus membranes NECK: Supple, no Thyromegaly or JVD. Tracheotomy midline clean and dry  PULMONARY: Coarse breath sounds bilaterally, no wheezing or rhonchi, vent assisted (synchronous with vent), even CARDIOVASCULAR: regular rate and rhythm, s1s2, no M/R/G, 2+ distal pulses GASTROINTESTINAL: Obese, soft, nontender, nondistended, no guarding or rebound tenderness, BS+ x4 MUSCULOSKELETAL: Generalized weakness, no deformities, no edema NEUROLOGIC: Currently sedated, withdraws from pain, pupils PERRL SKIN: Warm and dry.  No obvious rashes, lesions, or ulcerations   Assessment & Plan:    Acute Hypoxic Respiratory failure secondary to acute encephalopathy due to Alcohol withdrawal s/p trach +Acute Hypoxic Respiratory failure secondary to acute encephalopathy due to Alcohol withdrawal +ELIZABETHKINGIA SPECIES in TA started ABX Pneumonia, failure to we4an from vent and severe agitation from ETOH  abuse with signs and sympoms of brain damage at micro cellular level    Ventilator Dependent Respiratory Failure secondary to Acute Encephalopathy due to ETOH withdrawal and Aspiration Pneumonia -Full vent support -Wean FiO2 and PEEP as tolerated to maintain O2 sats greater than 92% -Status post tracheostomy -Support/trach collar trials as tolerated -VAP protocol -As needed bronchodilators   Aspiration Pneumonia>>treated with Unasyn Ventilator Associated Pneumonia (12/6: Tracheal aspirate>>ELIZABETHKINGIA MENINGOSEPTICA)>>treated with Minocycline -Monitor fever curve -Trend WBC's -Follow cultures as above -Repeat tracheal aspirate from 12/14 with ELIZABETHKINGIA (previously completed course of minocycline on 12/14) -Consult infectious disease, appreciate input, will defer antibiotics to infectious disease recommendations   Acute Toxic Metabolic Encephalopathy -Provide supportive care -Propofol and Dilaudid drips ~wean as tolerated -Continue scheduled oxycodone and Valium, fentanyl patch, Seroquel increased to 100 mg twice daily -Psych following, appreciate input -Obtain CT head today 12/17>>negative for any acute intracranial abnormality   Anemia without s/sx of bleeding -Monitor for S/Sx of bleeding -Trend CBC -Lovenox for VTE Prophylaxis  -Transfuse for Hgb <7   Hyperglycemia -CBGs -Sliding scale insulin -Follow ICU hypo/hyperglycemia protocol   GI -GI PROPHYLAXIS as indicated   NUTRITIONAL STATUS/DIET -Dietician following, TF's as tolerated -Constipation protocol as indicated -General surgery consulted for PEG tube placement, tentative plan for PEG on 12/18        BEST [employer]ACT[employer] DISPOSITION: ICU GOALS OF CARE: Full Code VTE PROPHYLAXIS: Lovenox SUP: Protonix UPDATES: Attempted to reach pt's mother Bryann Mcnealy via telephone 07/21/20, no answer, left message to call back for update.   Harlon Ditty, AGACNP-BC [facility] Pager: [phone]

## 2020-07-21 NOTE — Consult Note (Signed)
NAME: Nicholas Valdez  DOB: 10-19-1980  MRN: [id number]  Date/Time: 07/21/2020 1:34 PM  REQUESTING PROVIDER: Temple Pacini Subjective:  REASON FOR CONSULT: Nicholas pneumonia No history from patient as he is sedated  P tis in the hospitall since 06/19/20.  ? Nicholas Valdez is a 39 y.o. male with a history of alcohol and polysubstance use presented to ED on 11/16 with altered mental status Following is taken fom ICU note  06/20/20 Admit to ICU, intubated 06/23/20-patient unable to have SBT today due to sedation. .06/25/20-issues with hypoxia and asynchrony, patient has been intermittently febrile. 11/25severe hypoxia, resp failure, failure to wean from vent. 11/26-, severe DT .11/27 failure to wean from ven. 11/29-patient is difficult to wean with sedation, slight decrease in dilaudid patient tried to leave while intubated. 12/2-Patient with extremely high tolerance to sedation have been unable to bring down dosing. .07/07/2020:Tracheostomy planned for 12/6, remains encephalopathic, does not follow commands, agitation when sedation is lightened . 12/6TRACH placed by ENT.  Marland Kitchen12/6 Nicholas.Valdez in [place] started ABX minocycline on 12/8  12/14 severe agitation  I am asked to see him as the repeat    11/15: COVID-19 PCR>> negative 11/15: Influenza PCR>> negative 11/16: MRSA PCR>> negative 11/16: Tracheal aspirate>>negative 11/16: HIV screen>>nonreactive 11/18: Tracheal aspirate>> no growth 11/21:Tracheal aspirate >rare normal respiratory flora 12/1: Tracheal aspirate  >> Normal respiratory flora 12/6: Tracheal aspirate>>Nicholas MENINGOSEPTICA  12/6: MRSA PCR>>negative 12/14: Tracheal aspirate>> [place]  Antimicrobials Unasyn 11/17>>11/23; 11/29>>12/3 Fluconazole 12/1>>12/8 Vancomycin 11/21 >.11/23 Minocycline 12/8>>12/14    Past Medical History:  Diagnosis Date  . Alcohol abuse   . Drug abuse ([place])   . Hepatitis C     Past Surgical  History:  Procedure Laterality Date  . TRACHEOSTOMY TUBE PLACEMENT N/A 07/10/2020   Procedure: TRACHEOSTOMY;  Surgeon: Carloyn Manner, MD;  Location: [facility] ORS;  Service: ENT;  Laterality: N/A;    Social History   Socioeconomic History  . Marital status: Single    Spouse name: Not on file  . Number of children: Not on file  . Years of education: Not on file  . Highest education level: Not on file  Occupational History  . Not on file  Tobacco Use  . Smoking status: Current Every Day Smoker  . Smokeless tobacco: Never Used  Vaping Use  . Vaping Use: Former  Substance and Sexual Activity  . Alcohol use: Yes    Comment: Pt states that he usually drinks a pint of whiskey per day but has had none today.   . Drug use: Yes    Types: Marijuana  . Sexual activity: Not on file  Other Topics Concern  . Not on file  Social History Narrative  . Not on file   Social Determinants of Health   Financial Resource Strain: Not on file  Food Insecurity: Not on file  Transportation Needs: Not on file  Physical Activity: Not on file  Stress: Not on file  Social Connections: Not on file  Intimate Partner Violence: Not on file    Family History  Problem Relation Age of Onset  . Hypertension Other    Allergies  Allergen Reactions  . Ondansetron Hives  . Promethazine Hives  . Penicillins Hives    Did it involve swelling of the face/tongue/throat, SOB, or low BP? Yes Did it involve sudden or severe rash/hives, skin peeling, or any reaction on the inside of your mouth or nose? Yes Did you need to seek medical attention at a hospital or doctor's office? Yes When  did it last happen?childhood If all above answers are "NO", may proceed with cephalosporin use.  Marland Kitchen Zofran [Ondansetron Hcl] Hives    ? Current Facility-Administered Medications  Medication Dose Route Frequency Provider Last Rate Last Admin  . 0.9 %  sodium chloride infusion  250 mL Intravenous Continuous Awilda Bill,  NP   Stopped at 07/19/20 1640  . acetaminophen (TYLENOL) tablet 650 mg  650 mg Per Tube Q6H PRN Rust-Chester, Huel Cote, NP   650 mg at 07/19/20 1936   Or  . acetaminophen (TYLENOL) suppository 650 mg  650 mg Rectal Q6H PRN Rust-Chester, Britton L, NP      . albuterol (PROVENTIL) (2.5 MG/3ML) 0.083% nebulizer solution 2.5 mg  2.5 mg Nebulization Q4H PRN Rosine Door, MD      . Henrietta Hoover lotion   Topical PRN Flora Lipps, MD   1 application at 58/09/98 1429  . chlorhexidine gluconate (MEDLINE KIT) (PERIDEX) 0.12 % solution 15 mL  15 mL Mouth Rinse BID Flora Lipps, MD   15 mL at 07/21/20 0803  . Chlorhexidine Gluconate Cloth 2 % PADS 6 each  6 each Topical Daily Mercy Riding, MD   6 each at 07/20/20 2018  . docusate (COLACE) 50 MG/5ML liquid 100 mg  100 mg Per Tube BID Flora Lipps, MD   100 mg at 07/19/20 2148  . enoxaparin (LOVENOX) injection 72.5 mg  0.5 mg/kg Subcutaneous Q24H Rust-Chester, Britton L, NP   72.5 mg at 07/21/20 1006  . feeding supplement (PROSource TF) liquid 90 mL  90 mL Per Tube TID Flora Lipps, MD   90 mL at 07/21/20 1007  . feeding supplement (VITAL 1.5 CAL) liquid 1,000 mL  1,000 mL Per Tube Continuous Flora Lipps, MD 50 mL/hr at 07/21/20 0600 Rate Change at 07/21/20 0600  . fentaNYL (DURAGESIC) 100 MCG/HR 1 patch  1 patch Transdermal Q72H Flora Lipps, MD   1 patch at 07/20/20 1523  . folic acid (FOLVITE) tablet 1 mg  1 mg Per Tube Daily Flora Lipps, MD   1 mg at 07/21/20 1016  . gabapentin (NEURONTIN) 250 MG/5ML solution 600 mg  600 mg Per Tube Q8H Aleskerov, Fuad, MD   600 mg at 07/21/20 1331  . haloperidol lactate (HALDOL) injection 2 mg  2 mg Intravenous Q6H Clapacs, John T, MD   2 mg at 07/21/20 1132  . HYDROmorphone (DILAUDID) bolus via infusion 0.5 mg  0.5 mg Intravenous Q30 min PRN Rust-Chester, Britton L, NP   0.5 mg at 07/20/20 2103  . HYDROmorphone (DILAUDID) injection 1 mg  1 mg Intravenous Q2H PRN Flora Lipps, MD      . insulin aspart (novoLOG) injection  0-15 Units  0-15 Units Subcutaneous Q4H Rust-Chester, Britton L, NP   2 Units at 07/21/20 1148  . LORazepam (ATIVAN) injection 1-4 mg  1-4 mg Intravenous Q2H PRN Rosine Door, MD   2 mg at 07/21/20 1109  . MEDLINE mouth rinse  15 mL Mouth Rinse 10 times per day Flora Lipps, MD   15 mL at 07/21/20 1331  . melatonin tablet 5 mg  5 mg Per Tube QHS Flora Lipps, MD   5 mg at 07/20/20 2103  . multivitamin with minerals tablet 1 tablet  1 tablet Per Tube Daily Flora Lipps, MD   1 tablet at 07/21/20 1108  . nystatin (MYCOSTATIN/NYSTOP) topical powder   Topical BID Rust-Chester, Huel Cote, NP   Given at 07/21/20 1007  . OLANZapine (ZYPREXA) tablet 5 mg  5 mg  Per Tube QID PRN Flora Lipps, MD      . oxyCODONE (Oxy IR/ROXICODONE) immediate release tablet 10 mg  10 mg Per Tube Q4H Lockie Mola B, RPH   10 mg at 07/21/20 1109  . pantoprazole sodium (PROTONIX) 40 mg/20 mL oral suspension 40 mg  40 mg Per Tube Daily Flora Lipps, MD   40 mg at 07/21/20 1005  . polyethylene glycol (MIRALAX / GLYCOLAX) packet 17 g  17 g Per Tube Daily Flora Lipps, MD   17 g at 07/21/20 1005  . polyvinyl alcohol (LIQUIFILM TEARS) 1.4 % ophthalmic solution 1 drop  1 drop Both Eyes BID Benita Gutter, RPH   1 drop at 07/21/20 1007  . propofol (DIPRIVAN) 1000 MG/100ML infusion  5-80 mcg/kg/min Intravenous Titrated Flora Lipps, MD 68.3 mL/hr at 07/21/20 1332 80 mcg/kg/min at 07/21/20 1332  . QUEtiapine (SEROQUEL) tablet 100 mg  100 mg Per Tube BID Flora Lipps, MD   100 mg at 07/21/20 1005  . thiamine tablet 100 mg  100 mg Per Tube Daily Benita Gutter, RPH   100 mg at 07/21/20 1005  . vitamin B-12 (CYANOCOBALAMIN) tablet 1,000 mcg  1,000 mcg Per Tube Daily Flora Lipps, MD   1,000 mcg at 07/21/20 1005     Abtx:  Anti-infectives (From admission, onward)   Start     Dose/Rate Route Frequency Ordered Stop   07/13/20 0800  minocycline (MINOCIN) capsule 100 mg       "Followed by" Linked Group Details   100 mg Per Tube 2  times daily 07/12/20 1305 07/18/20 1958   07/12/20 1400  minocycline (MINOCIN) capsule 200 mg       "Followed by" Linked Group Details   200 mg Per Tube  Once 07/12/20 1305 07/12/20 1447   07/06/20 2200  fluconazole (DIFLUCAN) tablet 100 mg  Status:  Discontinued        100 mg Per Tube Daily at bedtime 07/05/20 1525 07/12/20 0908   07/06/20 0400  anidulafungin (ERAXIS) 50 mg in sodium chloride 0.9 % 50 mL IVPB  Status:  Discontinued       "Followed by" Linked Group Details   50 mg 78 mL/hr over 50 Minutes Intravenous Every 24 hours 07/05/20 0313 07/05/20 1525   07/05/20 2200  fluconazole (DIFLUCAN) tablet 200 mg        200 mg Per Tube  Once 07/05/20 1525 07/05/20 2211   07/05/20 0400  anidulafungin (ERAXIS) 100 mg in sodium chloride 0.9 % 100 mL IVPB       "Followed by" Linked Group Details   100 mg 78 mL/hr over 100 Minutes Intravenous  Once 07/05/20 0313 07/05/20 0554   07/03/20 0500  Ampicillin-Sulbactam (UNASYN) 3 g in sodium chloride 0.9 % 100 mL IVPB  Status:  Discontinued        3 g 200 mL/hr over 30 Minutes Intravenous Every 6 hours 07/03/20 0349 07/07/20 1331   06/26/20 0000  vancomycin (VANCOREADY) IVPB 1750 mg/350 mL  Status:  Discontinued        1,750 mg 175 mL/hr over 120 Minutes Intravenous Every 24 hours 06/24/20 2330 06/27/20 1123   06/25/20 0015  vancomycin (VANCOREADY) IVPB 2000 mg/400 mL       "Followed by" Linked Group Details   2,000 mg 200 mL/hr over 120 Minutes Intravenous  Once 06/24/20 2325 06/25/20 0204   06/25/20 0015  vancomycin (VANCOREADY) IVPB 500 mg/100 mL       "Followed by" Linked Group Details  500 mg 100 mL/hr over 60 Minutes Intravenous  Once 06/24/20 2325 06/25/20 0105   06/21/20 1000  Ampicillin-Sulbactam (UNASYN) 3 g in sodium chloride 0.9 % 100 mL IVPB  Status:  Discontinued        3 g 200 mL/hr over 30 Minutes Intravenous Every 6 hours 06/21/20 0925 06/27/20 1123      REVIEW OF SYSTEMS:  NA Objective:  VITALS:  BP 137/71   Pulse (!)  125   Temp 99.2 F (37.3 C) (Axillary)   Resp 19   Ht _0  (1.778 m)   Wt (!) 144.6 kg   SpO2 100%   BMI 45.74 kg/m  PHYSICAL EXAM:  General: sedated,  Head: Normocephalic, without obvious abnormality, atraumatic. Eyes: Conjunctivae clear, anicteric sclerae. Pupils are equal trachoestomy ENTcannot examine NG tube rt nostril Neck: Supple, Back: No decub Lungs: b/l air entry. Heart: s1s2 Abdomen: Soft, non-tender,not distended. Bowel sounds normal. No masses Extremities: edematous  Skin: No rashes or lesions. Or bruising Lymph: Cervical, supraclavicular normal. Neurologic: cannot be assessed Pertinent Labs Lab Results CBC    Component Value Date/Time   WBC 10.5 07/21/2020 0608   RBC 2.71 (L) 07/21/2020 0608   HGB 9.2 (L) 07/21/2020 0608   HGB 16.1 02/12/2013 2028   HCT 28.8 (L) 07/21/2020 0608   HCT 47.2 02/12/2013 2028   PLT 315 07/21/2020 0608   PLT 278 02/12/2013 2028   MCV 106.3 (H) 07/21/2020 0608   MCV 95 02/12/2013 2028   MCH 33.9 07/21/2020 0608   MCHC 31.9 07/21/2020 0608   RDW 14.1 07/21/2020 0608   RDW 13.4 02/12/2013 2028   LYMPHSABS 1.2 07/21/2020 0608   MONOABS 1.1 (H) 07/21/2020 0608   EOSABS 1.0 (H) 07/21/2020 0608   BASOSABS 0.1 07/21/2020 0608    CMP Latest Ref Rng & Units 07/21/2020 07/20/2020 07/19/2020  Glucose 70 - 99 mg/dL 153(H) 151(H) 130(H)  BUN 6 - 20 mg/dL 15 12 21(H)  Creatinine 0.61 - 1.24 mg/dL 0.69 0.79 0.96  Sodium 135 - 145 mmol/L 139 137 138  Potassium 3.5 - 5.1 mmol/L 4.8 3.4(L) 3.9  Chloride 98 - 111 mmol/L 99 99 100  CO2 22 - 32 mmol/L _1 Calcium 8.9 - 10.3 mg/dL 9.3 8.7(L) 8.8(L)  Total Protein 6.5 - 8.1 g/dL 7.4 - -  Total Bilirubin 0.3 - 1.2 mg/dL 0.5 - -  Alkaline Phos 38 - 126 U/L 50 - -  AST 15 - 41 U/L 25 - -  ALT 0 - 44 U/L 24 - -      Microbiology: Recent Results (from the past 240 hour(s))  Culture, respiratory     Status: None (Preliminary result)   Collection Time: 07/18/20  3:48 PM    Specimen: Tracheal Aspirate  Result Value Ref Range Status   Specimen Description TRACHEAL ASPIRATE  Final   Special Requests NONE  Final   Gram Stain   Final    ABUNDANT WBC PRESENT, PREDOMINANTLY PMN RARE GRAM NEGATIVE RODS    Culture   Final    FEW Nicholas MIRICOLA SUSCEPTIBILITIES TO FOLLOW Performed at [facility], [place] [address], [place], [place] [address]    Report Status PENDING  Incomplete    IMAGING RESULTS: MRI No intracranial abnormality. 2. Bilateral mastoid effusions and fluid in the middle ears. 3. Mucosal inflammatory changes of the paranasal sinuses  I have personally reviewed the films CT chest B/l upper lobe infiltrates ? Impression/Recommendation ?39 yr male with etoh abuse, polysubstance  use presented on 06/19/20 to the ED with alcohol intoxication and altered mental status Pt was intubated on 11/16 and admitted to ICU He has been difficult to extubate because of metal status changes and underwent tracheostomy on 07/10/20?  Intermittent fever Nicholas bacteria in tracheal culture- this may  be colonization ,but can easily cause nosocomial pneumonia and will have to be treated as he has b/l upper lobe infiltrates and intermittent fever He had received IV minocycline with no improvement Will do IV cipro  B/l mastoid effusion and fluid in middle ear and paranasal sinus inflammation- need to remove the NG tube and assess  Delirium, agitation, DT requiring sedation   Anemia -chronic illness  long stay in hospital  Macrocytosis due to alcohol  Discussed the management with his nurses and pharmacist  ID will follow him peripherally this weekend- call if needed  Note:  This document was prepared using Dragon voice recognition software and may include unintentional dictation errors.

## 2020-07-21 NOTE — Progress Notes (Signed)
PHARMACY CONSULT NOTE  Pharmacy Consult for Electrolyte Monitoring and Replacement   Recent Labs: Potassium (mmol/L)  Date Value  07/21/2020 4.8  02/12/2013 3.9   Magnesium (mg/dL)  Date Value  01/75/1025 2.0   Calcium (mg/dL)  Date Value  85/27/7824 9.3   Calcium, Total (mg/dL)  Date Value  23/53/6144 8.7   Albumin (g/dL)  Date Value  31/54/0086 3.2 (L)  02/12/2013 3.6   Phosphorus (mg/dL)  Date Value  76/19/5093 5.3 (H)   Sodium (mmol/L)  Date Value  07/21/2020 139  02/12/2013 139   Assessment: 39 year old male here with encephalopathy s/t alcohol withdrawal. Patient has been intubated and sedated since 11/16. 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 Pharmacy to manage electrolytes.  Tube feeds: Vital 1.5 Cal at 50 mL/hr  + PROSource TF 90 mL 3 times daily per tube  Free water: 200 mL every 4 hours (d/c 12/2)  Diuretics: IV Lasix 40 mg daily (started 12/1, d/c 12/9)  Bowel regimen: Lactulose 20 g BID (d/c 12/2). Colace 100 mg BID + Miralax 17 g daily (started 12/14   Goal of Therapy:  Electrolytes WNL  Plan:   No replacement needed today  Will continue to follow electrolytes   Sharen Hones, PharmD, BCPS 07/21/2020 2:16 PM

## 2020-07-21 NOTE — Consult Note (Signed)
[facility] SURGICAL CONSULTATION NOTE (initial) - cpt: [phone]   HISTORY OF PRESENT ILLNESS (HPI):  Patient remains sedated, non-participatory in interview, and history obtained through chart review and discussion with members of the medical team.   39 y.o. male presented to [facility] ED initially on 11/15 for evaluation of AMS and EtOh intoxication. At the time of presentation, patient had reportedly gone to [facility] for rehab and was found to be altered and confused and was sent to the ED for evaluation. He subsequently admitted to drinking heavily prior to going to rehab. He was admitted to the step down unit for alcohol withdrawal and requiring Precedex. On hospital day 1, he did require emergent intubation for airway protection. Unfortunately, due to hypoxemia and agitation, he was unable to be weaned off the ventilator. He underwent tracheostomy on 12/06 with ENT (Dr Andee Poles). Attempts currently being made at weaning to tracheostomy collar. Over this time, he has been receiving tube feedings via NGT. He was evaluated by IR for PEG placement however felt he wasn't a candidate given his agitation and concern that he will pull out PEG tube.   Surgery is consulted by PCCM physician Dr. Erin Fulling, MD in this context for evaluation and management of surgically placed gastrostomy tube.   PAST MEDICAL HISTORY (PMH):  Past Medical History:  Diagnosis Date  . Alcohol abuse   . Drug abuse ([facility])   . Hepatitis C      PAST SURGICAL HISTORY (PSH):  Past Surgical History:  Procedure Laterality Date  . TRACHEOSTOMY TUBE PLACEMENT N/A 07/10/2020   Procedure: TRACHEOSTOMY;  Surgeon: Bud Face, MD;  Location: [facility] ORS;  Service: ENT;  Laterality: N/A;     MEDICATIONS:  Prior to Admission medications   Medication Sig Start Date End Date Taking? Authorizing Provider  gabapentin (NEURONTIN) 300 MG capsule Take 300 mg by mouth every 8 (eight) hours. 05/29/20  Yes [provider]   Sofosbuvir-Velpatasvir 400-100 MG TABS Take 1 tablet by mouth daily. 06/06/20  Yes [provider]  chlordiazePOXIDE (LIBRIUM) 25 MG capsule Day 1 - 50 mg every 6 hours Day 2 - 25 mg every 6 hours Day 3 - 25 mg twice a day Day 4- 25 mg at night 05/28/20   Jene Every, MD  folic acid (FOLVITE) 1 MG tablet Take by mouth. Patient not taking: Reported on 06/20/2020 11/25/19 11/24/20  [provider]     ALLERGIES:  Allergies  Allergen Reactions  . Ondansetron Hives  . Promethazine Hives  . Penicillins Hives    Did it involve swelling of the face/tongue/throat, SOB, or low BP? Yes Did it involve sudden or severe rash/hives, skin peeling, or any reaction on the inside of your mouth or nose? Yes Did you need to seek medical attention at a hospital or doctor's office? Yes When did it last happen?childhood If all above answers are "NO", may proceed with cephalosporin use.  Marland Kitchen Zofran [Ondansetron Hcl] Hives     SOCIAL HISTORY:  Social History   Socioeconomic History  . Marital status: Single    Spouse name: Not on file  . Number of children: Not on file  . Years of education: Not on file  . Highest education level: Not on file  Occupational History  . Not on file  Tobacco Use  . Smoking status: Current Every Day Smoker  . Smokeless tobacco: Never Used  Vaping Use  . Vaping Use: Former  Substance and Sexual Activity  . Alcohol use: Yes  Comment: Pt states that he usually drinks a pint of whiskey per day but has had none today.   . Drug use: Yes    Types: Marijuana  . Sexual activity: Not on file  Other Topics Concern  . Not on file  Social History Narrative  . Not on file   Social Determinants of Health   Financial Resource Strain: Not on file  Food Insecurity: Not on file  Transportation Needs: Not on file  Physical Activity: Not on file  Stress: Not on file  Social Connections: Not on file  Intimate Partner Violence: Not on file      FAMILY HISTORY:  Family History  Problem Relation Age of Onset  . Hypertension Other       REVIEW OF SYSTEMS:  Review of Systems  Unable to perform ROS: Mental status change    VITAL SIGNS:  Temp:  [97.3 F (36.3 C)-100.3 F (37.9 C)] 99.2 F (37.3 C) (12/17 0800) Pulse Rate:  [75-117] 117 (12/17 1100) Resp:  [11-40] 40 (12/17 1100) BP: (102-132)/(65-117) 132/117 (12/17 1100) SpO2:  [94 %-100 %] 100 % (12/17 1100) FiO2 (%):  [35 %] 35 % (12/17 0845) Weight:  [144.6 kg] 144.6 kg (12/17 0349)     Height: 5\' 10"  (177.8 cm) Weight: (!) 144.6 kg BMI (Calculated): 45.74   INTAKE/OUTPUT:  12/16 0701 - 12/17 0700 In: 2179.6 [I.V.:549.6; NG/GT:1630] Out: 1410 [Urine:1410]  PHYSICAL EXAM:  Physical Exam Constitutional:      General: He is not in acute distress.    Appearance: He is obese. He is not ill-appearing.     Comments: Patient sedated, on ventilator support  HENT:     Head: Normocephalic and atraumatic.  Neck:     Trachea: Tracheostomy present.     Comments: Tracheostomy present  Cardiovascular:     Rate and Rhythm: Regular rhythm. Tachycardia present.     Pulses: Normal pulses.  Pulmonary:     Effort: No respiratory distress.     Comments: On ventilator Abdominal:     General: There is no distension.     Tenderness: There is no abdominal tenderness. There is no guarding or rebound.     Comments: Abdomen is obese, soft, does not appear tender although difficult given mental status, non-distended, no previous abdominal surgeries.   Genitourinary:    Comments: Foley in place Musculoskeletal:     Comments: Restraints to upper extremities   Skin:    General: Skin is warm and dry.     Coloration: Skin is not pale.     Findings: No erythema.  Neurological:     Comments: Unable to reliably assess  Psychiatric:     Comments: Unable to reliably assess      Labs:  CBC Latest Ref Rng & Units 07/21/2020 07/20/2020 07/19/2020  WBC 4.0 - 10.5 K/uL 10.5 7.9  12.8(H)  Hemoglobin 13.0 - 17.0 g/dL 07/21/2020) 4.0(X) 7.5(L)  Hematocrit 39.0 - 52.0 % 28.8(L) 25.1(L) 21.5(L)  Platelets 150 - 400 K/uL 315 299 338   CMP Latest Ref Rng & Units 07/20/2020 07/19/2020 07/18/2020  Glucose 70 - 99 mg/dL 07/20/2020) 329(J) 242(A)  BUN 6 - 20 mg/dL 12 834(H) 96(Q)  Creatinine 0.61 - 1.24 mg/dL 22(L 7.98 9.21  Sodium 135 - 145 mmol/L 137 138 138  Potassium 3.5 - 5.1 mmol/L 3.4(L) 3.9 4.6  Chloride 98 - 111 mmol/L 99 100 101  CO2 22 - 32 mmol/L 30 27 28   Calcium 8.9 - 10.3 mg/dL 1.94) ) 8.9  Total Protein 6.5 - 8.1 g/dL - - -  Total Bilirubin 0.3 - 1.2 mg/dL - - -  Alkaline Phos 38 - 126 U/L - - -  AST 15 - 41 U/L - - -  ALT 0 - 44 U/L - - -     Imaging studies:   CT Abdomen/Pelvis (07/20/2020) personally reviewed without gross intra-abdominal abnormalities, and radiologist report reviewed below:  IMPRESSION: Bibasilar pulmonary infiltrates, likely infectious or inflammatory and suggestive of atypical infection or aspiration.  Mild hepatosplenomegaly.  Nasogastric tube extends into the mid to distal body of the stomach. In this region, there is an appropriate tract for potential percutaneous access of the stomach.   Assessment/Plan: (ICD-10's: G61.40) 39 y.o. male in need for gastrostomy tube placement for enteral feedings, admitted with ventilator dependent respiratory failure secondary to acute encephalopathy from EtOH withdrawal and aspiration PNA   - We will tentatively plan for XI Robotic Assisted Gastrostomy Tube placement tomorrow (12/18) with Dr Everlene Farrier pending OR/Anesthesia availability   - I have attempted to reach the patient's mother via phone number in chart x2 without success. I will continue to reach out in attempt to get consent for above procedure.   - Hold tube feedings at midnight  All of the above findings and recommendations were discussed with the medical team.   Thank you for the opportunity to participate in this patient's  care.   -- Lynden Oxford, PA-C [facility] 07/21/2020, 12:12 PM [phone] M-F: 7am - 4pm

## 2020-07-22 ENCOUNTER — Inpatient Hospital Stay: Payer: Medicaid Other | Admitting: Anesthesiology

## 2020-07-22 ENCOUNTER — Encounter
Admission: EM | Disposition: A | Discharge status: Rehabilitation Facility | Payer: Self-pay | Source: Home / Self Care | Attending: Internal Medicine

## 2020-07-22 DIAGNOSIS — E669 Obesity, unspecified: Secondary | ICD-10-CM | POA: Diagnosis not present

## 2020-07-22 DIAGNOSIS — E46 Unspecified protein-calorie malnutrition: Secondary | ICD-10-CM | POA: Diagnosis not present

## 2020-07-22 DIAGNOSIS — J9690 Respiratory failure, unspecified, unspecified whether with hypoxia or hypercapnia: Secondary | ICD-10-CM | POA: Diagnosis not present

## 2020-07-22 HISTORY — PX: GASTROSTOMY W/ FEEDING TUBE: SUR642

## 2020-07-22 LAB — BASIC METABOLIC PANEL
Anion gap: 10 (ref 5–15)
BUN: 16 mg/dL (ref 6–20)
CO2: 30 mmol/L (ref 22–32)
Calcium: 9.2 mg/dL (ref 8.9–10.3)
Chloride: 100 mmol/L (ref 98–111)
Creatinine, Ser: 0.79 mg/dL (ref 0.61–1.24)
GFR, Estimated: >60 mL/min (ref >60)
Glucose, Bld: 112 mg/dL — ABNORMAL HIGH (ref 70–99)
Potassium: 3.3 mmol/L — ABNORMAL LOW (ref 3.5–5.1)
Sodium: 140 mmol/L (ref 135–145)

## 2020-07-22 LAB — GLUCOSE, CAPILLARY
Glucose-Capillary: 105 mg/dL — ABNORMAL HIGH (ref 70–99)
Glucose-Capillary: 93 mg/dL (ref 70–99)
Glucose-Capillary: 154 mg/dL — ABNORMAL HIGH (ref 70–99)
Glucose-Capillary: 173 mg/dL — ABNORMAL HIGH (ref 70–99)
Glucose-Capillary: 124 mg/dL — ABNORMAL HIGH (ref 70–99)
Glucose-Capillary: 109 mg/dL — ABNORMAL HIGH (ref 70–99)

## 2020-07-22 LAB — CBC WITH DIFFERENTIAL/PLATELET
Abs Immature Granulocytes: 0.23 10*3/uL — ABNORMAL HIGH (ref 0.00–0.07)
Basophils Absolute: 0 10*3/uL (ref 0.0–0.1)
Basophils Relative: 0 %
Eosinophils Absolute: 1 10*3/uL — ABNORMAL HIGH (ref 0.0–0.5)
Eosinophils Relative: 11 %
HCT: 26.6 % — ABNORMAL LOW (ref 39.0–52.0)
Hemoglobin: 8.8 g/dL — ABNORMAL LOW (ref 13.0–17.0)
Immature Granulocytes: 3 %
Lymphocytes Relative: 18 %
Lymphs Abs: 1.6 10*3/uL (ref 0.7–4.0)
MCH: 34 pg (ref 26.0–34.0)
MCHC: 33.1 g/dL (ref 30.0–36.0)
MCV: 102.7 fL — ABNORMAL HIGH (ref 80.0–100.0)
Monocytes Absolute: 0.9 10*3/uL (ref 0.1–1.0)
Monocytes Relative: 10 %
Neutro Abs: 5.3 10*3/uL (ref 1.7–7.7)
Neutrophils Relative %: 58 %
Platelets: 323 10*3/uL (ref 150–400)
RBC: 2.59 MIL/uL — ABNORMAL LOW (ref 4.22–5.81)
RDW: 14 % (ref 11.5–15.5)
WBC: 9 10*3/uL (ref 4.0–10.5)
nRBC: 0.2 % (ref 0.0–0.2)

## 2020-07-22 LAB — MAGNESIUM: Magnesium: 1.9 mg/dL (ref 1.7–2.4)

## 2020-07-22 LAB — CULTURE, RESPIRATORY W GRAM STAIN

## 2020-07-22 LAB — PHOSPHORUS: Phosphorus: 4.5 mg/dL (ref 2.5–4.6)

## 2020-07-22 SURGERY — INSERTION, GASTROSTOMY TUBE, ROBOT-ASSISTED
Anesthesia: General

## 2020-07-22 MED ORDER — MIDAZOLAM HCL 2 MG/2ML IJ SOLN
INTRAMUSCULAR | Status: AC
  Filled 2020-07-22: qty 2

## 2020-07-22 MED ORDER — CEFAZOLIN SODIUM 1 G IJ SOLR
INTRAMUSCULAR | Status: AC
  Filled 2020-07-22: qty 10

## 2020-07-22 MED ORDER — DEXMEDETOMIDINE (PRECEDEX) IN NS 20 MCG/5ML (4 MCG/ML) IV SYRINGE
PREFILLED_SYRINGE | INTRAVENOUS | Status: AC
  Filled 2020-07-22: qty 5

## 2020-07-22 MED ORDER — DEXMEDETOMIDINE (PRECEDEX) IN NS 20 MCG/5ML (4 MCG/ML) IV SYRINGE
PREFILLED_SYRINGE | INTRAVENOUS | Status: DC | PRN
  Administered 2020-07-22 (×2): 20 ug via INTRAVENOUS

## 2020-07-22 MED ORDER — FENTANYL CITRATE (PF) 100 MCG/2ML IJ SOLN
INTRAMUSCULAR | Status: AC
  Filled 2020-07-22: qty 2

## 2020-07-22 MED ORDER — SEVOFLURANE IN SOLN
RESPIRATORY_TRACT | Status: AC
  Filled 2020-07-22: qty 250

## 2020-07-22 MED ORDER — PHENYLEPHRINE HCL (PRESSORS) 10 MG/ML IV SOLN
INTRAVENOUS | Status: DC | PRN
  Administered 2020-07-22: 100 ug via INTRAVENOUS
  Administered 2020-07-22: 200 ug via INTRAVENOUS
  Administered 2020-07-22: 100 ug via INTRAVENOUS

## 2020-07-22 MED ORDER — KETOROLAC TROMETHAMINE 30 MG/ML IJ SOLN
INTRAMUSCULAR | Status: AC
  Filled 2020-07-22: qty 1

## 2020-07-22 MED ORDER — ROCURONIUM BROMIDE 100 MG/10ML IV SOLN
INTRAVENOUS | Status: DC | PRN
  Administered 2020-07-22: 100 mg via INTRAVENOUS
  Administered 2020-07-22 (×4): 50 mg via INTRAVENOUS

## 2020-07-22 MED ORDER — FENTANYL CITRATE (PF) 100 MCG/2ML IJ SOLN
INTRAMUSCULAR | Status: DC | PRN
  Administered 2020-07-22 (×2): 50 ug via INTRAVENOUS

## 2020-07-22 MED ORDER — LACTATED RINGERS IV SOLN: INTRAVENOUS | Status: DC | PRN

## 2020-07-22 MED ORDER — ACETAMINOPHEN 10 MG/ML IV SOLN
INTRAVENOUS | Status: DC | PRN
  Administered 2020-07-22: 1000 mg via INTRAVENOUS

## 2020-07-22 MED ORDER — ONDANSETRON HCL 4 MG/2ML IJ SOLN
INTRAMUSCULAR | Status: AC
  Filled 2020-07-22: qty 2

## 2020-07-22 MED ORDER — ROCURONIUM BROMIDE 10 MG/ML (PF) SYRINGE
PREFILLED_SYRINGE | INTRAVENOUS | Status: AC
  Filled 2020-07-22: qty 10

## 2020-07-22 MED ORDER — PROPOFOL 10 MG/ML IV BOLUS
INTRAVENOUS | Status: AC
  Filled 2020-07-22: qty 20

## 2020-07-22 MED ORDER — CEFAZOLIN SODIUM-DEXTROSE 2-3 GM-%(50ML) IV SOLR
INTRAVENOUS | Status: DC | PRN
  Administered 2020-07-22: 2 g via INTRAVENOUS

## 2020-07-22 MED ORDER — DEXAMETHASONE SODIUM PHOSPHATE 10 MG/ML IJ SOLN
INTRAMUSCULAR | Status: AC
  Filled 2020-07-22: qty 1

## 2020-07-22 MED ORDER — POTASSIUM CHLORIDE 10 MEQ/100ML IV SOLN
10.0000 meq | INTRAVENOUS | Status: AC
  Administered 2020-07-22 (×6): 10 meq via INTRAVENOUS
  Filled 2020-07-22 (×6): qty 100

## 2020-07-22 MED ORDER — BUPIVACAINE LIPOSOME 1.3 % IJ SUSP
INTRAMUSCULAR | Status: DC | PRN
  Administered 2020-07-22: 20 mL

## 2020-07-22 MED ORDER — POTASSIUM CHLORIDE 10 MEQ/50ML IV SOLN: 10.0000 meq | INTRAVENOUS | Status: DC

## 2020-07-22 MED ORDER — MIDAZOLAM HCL 2 MG/2ML IJ SOLN
INTRAMUSCULAR | Status: DC | PRN
  Administered 2020-07-22 (×2): 2 mg via INTRAVENOUS

## 2020-07-22 MED ORDER — BUPIVACAINE-EPINEPHRINE 0.25% -1:200000 IJ SOLN
INTRAMUSCULAR | Status: DC | PRN
  Administered 2020-07-22: 30 mL

## 2020-07-22 MED ORDER — DEXAMETHASONE SODIUM PHOSPHATE 10 MG/ML IJ SOLN
INTRAMUSCULAR | Status: DC | PRN
  Administered 2020-07-22: 10 mg via INTRAVENOUS

## 2020-07-22 MED ORDER — ACETAMINOPHEN 10 MG/ML IV SOLN
INTRAVENOUS | Status: AC
  Filled 2020-07-22: qty 100

## 2020-07-22 SURGICAL SUPPLY — 64 items
ADH SKN CLS APL DERMABOND .7 (GAUZE/BANDAGES/DRESSINGS) ×1
APL PRP STRL LF DISP 70% ISPRP (MISCELLANEOUS) ×1
CANISTER SUCT 1200ML W/VALVE (MISCELLANEOUS) ×2 IMPLANT
CANNULA REDUC XI 12-8 STAPL (CANNULA) ×2
CANNULA REDUCER 12-8 DVNC XI (CANNULA) ×1 IMPLANT
CHLORAPREP W/TINT 26 (MISCELLANEOUS) ×2 IMPLANT
COVER TIP SHEARS 8 DVNC (MISCELLANEOUS) ×1 IMPLANT
COVER TIP SHEARS 8MM DA VINCI (MISCELLANEOUS) ×2
COVER WAND RF STERILE (DRAPES) ×4 IMPLANT
DEFOGGER SCOPE WARMER CLEARIFY (MISCELLANEOUS) ×2 IMPLANT
DERMABOND ADVANCED (GAUZE/BANDAGES/DRESSINGS) ×1
DERMABOND ADVANCED .7 DNX12 (GAUZE/BANDAGES/DRESSINGS) ×1 IMPLANT
DRAPE ARM DVNC X/XI (DISPOSABLE) ×3 IMPLANT
DRAPE COLUMN DVNC XI (DISPOSABLE) ×1 IMPLANT
DRAPE DA VINCI XI ARM (DISPOSABLE) ×8
DRAPE DA VINCI XI COLUMN (DISPOSABLE) ×2
ELECT CAUTERY BLADE 6.4 (BLADE) ×2 IMPLANT
ELECT REM PT RETURN 9FT ADLT (ELECTROSURGICAL) ×2
ELECTRODE REM PT RTRN 9FT ADLT (ELECTROSURGICAL) ×1 IMPLANT
GLOVE BIO SURGEON STRL SZ7 (GLOVE) ×4 IMPLANT
GOWN STRL REUS W/ TWL LRG LVL3 (GOWN DISPOSABLE) ×3 IMPLANT
GOWN STRL REUS W/TWL LRG LVL3 (GOWN DISPOSABLE) ×6
IRRIGATION STRYKERFLOW (MISCELLANEOUS) IMPLANT
IRRIGATOR STRYKERFLOW (MISCELLANEOUS)
KIT PINK PAD W/HEAD ARE REST (MISCELLANEOUS) ×2
KIT PINK PAD W/HEAD ARM REST (MISCELLANEOUS) ×1 IMPLANT
LABEL OR SOLS (LABEL) ×2 IMPLANT
MANIFOLD NEPTUNE II (INSTRUMENTS) ×2 IMPLANT
NDL INSUFFLATION 14GA 120MM (NEEDLE) ×1 IMPLANT
NEEDLE HYPO 22GX1.5 SAFETY (NEEDLE) ×2 IMPLANT
NEEDLE INSUFFLATION 14GA 120MM (NEEDLE) ×2 IMPLANT
OBTURATOR OPTICAL STANDARD 8MM (TROCAR) ×2
OBTURATOR OPTICAL STND 8 DVNC (TROCAR) ×1
OBTURATOR OPTICALSTD 8 DVNC (TROCAR) ×1 IMPLANT
PACK LAP CHOLECYSTECTOMY (MISCELLANEOUS) ×2 IMPLANT
PENCIL ELECTRO HAND CTR (MISCELLANEOUS) ×2 IMPLANT
SEAL CANN UNIV 5-8 DVNC XI (MISCELLANEOUS) ×3 IMPLANT
SEAL XI 5MM-8MM UNIVERSAL (MISCELLANEOUS) ×8
SEALER VESSEL DA VINCI XI (MISCELLANEOUS)
SEALER VESSEL EXT DVNC XI (MISCELLANEOUS) IMPLANT
SET TUBE SMOKE EVAC HIGH FLOW (TUBING) ×2 IMPLANT
SOLUTION ELECTROLUBE (MISCELLANEOUS) ×2 IMPLANT
SPONGE DRAIN TRACH 4X4 STRL 2S (GAUZE/BANDAGES/DRESSINGS) ×2 IMPLANT
SPONGE LAP 18X18 RF (DISPOSABLE) ×2 IMPLANT
SPONGE LAP 4X18 RFD (DISPOSABLE) ×2 IMPLANT
STAPLER CANNULA SEAL DVNC XI (STAPLE) ×1 IMPLANT
STAPLER CANNULA SEAL XI (STAPLE) ×2
SUT ETHILON 3-0 FS-10 30 BLK (SUTURE) ×2
SUT MNCRL 4-0 (SUTURE) ×2
SUT MNCRL 4-0 27XMFL (SUTURE) ×1
SUT V-LOC 90 ABS 3-0 VLT  V-20 (SUTURE) ×2
SUT V-LOC 90 ABS 3-0 VLT V-20 (SUTURE) ×1 IMPLANT
SUT VICRYL 0 AB UR-6 (SUTURE) ×4 IMPLANT
SUT VLOC 180 2-0 9IN GS21 (SUTURE) ×2 IMPLANT
SUT VLOC 90 S/L VL9 GS22 (SUTURE) ×2 IMPLANT
SUTURE EHLN 3-0 FS-10 30 BLK (SUTURE) ×1 IMPLANT
SUTURE MNCRL 4-0 27XMF (SUTURE) ×1 IMPLANT
SYR 20ML LL LF (SYRINGE) ×2 IMPLANT
SYR 30ML LL (SYRINGE) ×2 IMPLANT
TROCAR BALLN GELPORT 12X130M (ENDOMECHANICALS) ×1 IMPLANT
TUBE GASTRO 14F 5C (TUBING) IMPLANT
TUBE JEJUNO 16X14 (TUBING) ×1 IMPLANT
TUBE JEJUNO 18X14 (TUBING) IMPLANT
TUBE JEJUNO 22X14 (TUBING) IMPLANT

## 2020-07-22 NOTE — Anesthesia Preprocedure Evaluation (Signed)
Anesthesia Evaluation  Patient identified by MRN, date of birth, ID band Patient confused    Reviewed: Allergy & Precautions, H&P , NPO status , Patient's Chart, lab work & pertinent test results  History of Anesthesia Complications Negative for: history of anesthetic complications  Airway Mallampati: Trach  TM Distance: >3 FB Neck ROM: limited    Dental  (+) Chipped   Pulmonary neg shortness of breath, pneumonia, unresolved, Current Smoker,    Pulmonary exam normal        Cardiovascular negative cardio ROS Normal cardiovascular exam     Neuro/Psych PSYCHIATRIC DISORDERS negative neurological ROS  negative psych ROS   GI/Hepatic negative GI ROS, (+) Hepatitis -, C  Endo/Other  negative endocrine ROS  Renal/GU      Musculoskeletal   Abdominal   Peds  Hematology negative hematology ROS (+)   Anesthesia Other Findings Past Medical History: No date: Alcohol abuse No date: Drug abuse (HCC) No date: Hepatitis C  History reviewed. No pertinent surgical history.  BMI    Body Mass Index: 47.48 kg/m      Reproductive/Obstetrics negative OB ROS                             Anesthesia Physical  Anesthesia Plan  ASA: IV  Anesthesia Plan: General ETT   Post-op Pain Management:    Induction: Intravenous  PONV Risk Score and Plan: Ondansetron, Dexamethasone, Midazolam and Treatment may vary due to age or medical condition  Airway Management Planned: Tracheostomy  Additional Equipment:   Intra-op Plan:   Post-operative Plan: Extubation in OR  Informed Consent: I have reviewed the patients History and Physical, chart, labs and discussed the procedure including the risks, benefits and alternatives for the proposed anesthesia with the patient or authorized representative who has indicated his/her understanding and acceptance.     Dental Advisory Given  Plan Discussed with:  Anesthesiologist, CRNA and Surgeon  Anesthesia Plan Comments: (History and phone consent from the patients mother Malosi Hemstreet at [phone]   Mother consented for risks of anesthesia including but not limited to:  - adverse reactions to medications - damage to eyes, teeth, lips or other oral mucosa - nerve damage due to positioning  - sore throat or hoarseness - Damage to heart, brain, nerves, lungs, other parts of body or loss of life  She voiced understanding.)        Anesthesia Quick Evaluation

## 2020-07-22 NOTE — Transfer of Care (Signed)
Immediate Anesthesia Transfer of Care Note  Patient: Nicholas Valdez  Procedure(s) Performed: Procedure(s): XI ROBOT ASSISTED GASTROSTOMY TUBE PLACEMENT (N/A)  Patient Location: ICU 15  Anesthesia Type:General  Level of Consciousness: sedated  Airway & Oxygen Therapy: Patient Spontanous Breathing and Patient connected to face mask oxygen  Post-op Assessment: Report given to RN and Post -op Vital signs reviewed and stable  Post vital signs: Reviewed and stable  Last Vitals:  Vitals:   07/22/20 0815 07/22/20 1033  BP:  126/85  Pulse: 72   Resp: (!) 22 20  Temp:    SpO2: 100% 100%    Complications: No apparent anesthesia complications

## 2020-07-22 NOTE — Progress Notes (Signed)
PHARMACY CONSULT NOTE  Pharmacy Consult for Electrolyte Monitoring and Replacement   Recent Labs: Potassium (mmol/L)  Date Value  07/22/2020 3.3 (L)  02/12/2013 3.9   Magnesium (mg/dL)  Date Value  42/87/6811 1.9   Calcium (mg/dL)  Date Value  57/26/2035 9.2   Calcium, Total (mg/dL)  Date Value  59/74/1638 8.7   Albumin (g/dL)  Date Value  45/36/4680 3.2 (L)  02/12/2013 3.6   Phosphorus (mg/dL)  Date Value  32/07/2481 4.5   Sodium (mmol/L)  Date Value  07/22/2020 140  02/12/2013 139   Assessment: 39 year old male here with encephalopathy s/t alcohol withdrawal. Patient has been intubated and sedated since 11/16. 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 Pharmacy to manage electrolytes.  Tube feeds: Vital 1.5 Cal at 50 mL/hr  + PROSource TF 90 mL 3 times daily per tube  Free water: 200 mL every 4 hours (d/c 12/2)  Diuretics: IV Lasix 40 mg daily (started 12/1, d/c 12/9)  Bowel regimen: Lactulose 20 g BID (d/c 12/2). Colace 100 mg BID + Miralax 17 g daily (started 12/14   Goal of Therapy:  Electrolytes WNL  Plan:   KCl 10 mEq x 6 ordered by medical team.   Will continue to follow electrolytes   Ronnald Ramp, PharmD, BCPS 07/22/2020 7:47 AM

## 2020-07-22 NOTE — Plan of Care (Signed)
Patient off of unit for OR for PEG placement at 0815 hrs with CRNA and Anesthesiologist.    Problem: Education: Goal: Knowledge of General Education information will improve Description: Including pain rating scale, medication(s)/side effects and non-pharmacologic comfort measures Outcome: Not Progressing   Problem: Health Behavior/Discharge Planning: Goal: Ability to manage health-related needs will improve Outcome: Not Progressing   Problem: Clinical Measurements: Goal: Ability to maintain clinical measurements within normal limits will improve Outcome: Not Progressing Goal: Will remain free from infection Outcome: Not Progressing Goal: Diagnostic test results will improve Outcome: Not Progressing Goal: Respiratory complications will improve Outcome: Not Progressing Goal: Cardiovascular complication will be avoided Outcome: Not Progressing   Problem: Activity: Goal: Risk for activity intolerance will decrease Outcome: Not Progressing   Problem: Nutrition: Goal: Adequate nutrition will be maintained Outcome: Not Progressing   Problem: Coping: Goal: Level of anxiety will decrease Outcome: Not Progressing   Problem: Elimination: Goal: Will not experience complications related to bowel motility Outcome: Not Progressing Goal: Will not experience complications related to urinary retention Outcome: Not Progressing   Problem: Pain Managment: Goal: General experience of comfort will improve Outcome: Not Progressing   Problem: Safety: Goal: Ability to remain free from injury will improve Outcome: Not Progressing   Problem: Skin Integrity: Goal: Risk for impaired skin integrity will decrease Outcome: Not Progressing

## 2020-07-22 NOTE — Anesthesia Procedure Notes (Signed)
Date/Time: 07/22/2020 8:20 AM Performed by: Stormy Fabian, CRNA Pre-anesthesia Checklist: Patient identified, Patient being monitored, Timeout performed, Emergency Drugs available and Suction available Patient Re-evaluated:Patient Re-evaluated prior to induction Oxygen Delivery Method: Circle system utilized Preoxygenation: Pre-oxygenation with 100% oxygen Induction Type: Inhalational induction and Tracheostomy Ventilation: Mask ventilation without difficulty Number of attempts: 1 Placement Confirmation: positive ETCO2 and breath sounds checked- equal and bilateral Tube secured with: Tape (Trach secured with trach ties) Dental Injury: Teeth and Oropharynx as per pre-operative assessment

## 2020-07-22 NOTE — Op Note (Addendum)
Robotic assisted laparoscopic gastrostomy tube   Pre-operative Diagnosis: malnutrition, resp failure   Post-operative Diagnosis: same   Procedure:  Robotic assisted laparoscopic G tube # 16 FR   Surgeon: Sterling Big, MD FACS   Anesthesia: Gen. with endotracheal tube   Findings: Morbid obesity Fatty and large liver. I needed to place extra port to retract Left lobe of the liver for exposure.    Estimated Blood Loss:5 cc             Complications: none     Procedure Details  The patient was seen again in the ICU. The benefits, complications, treatment options, and expected outcomes were discussed with the Family. The risks of bleeding, infection, recurrence of symptoms, failure to resolve symptoms, bowel injury, any of which could require further surgery were reviewed .   The  family concurred with the proposed plan, giving informed consent.  The patient was taken to Operating Room, identified  and the procedure verified. A Time Out was held and the above information confirmed.   Prior to the induction of general anesthesia, antibiotic prophylaxis was administered. VTE prophylaxis was in place. General endotracheal anesthesia was then administered and tolerated well. After the induction, the abdomen was prepped with Chloraprep and draped in the sterile fashion. The patient was positioned in the supine position. Palmer's point was used to insert the veres needle, drop test performed with good results, we obtained pneumoperitoneum w/o HD changes.   Three 8 mm ports were placed under direct visualization. We used optiview technique for left lateral port visualizing the anterior and posterior sheaths.  Inspection revealed large fatty liver, left lobe between stomach and abdominal wall. An additional robotic port was placed RLQ to used as a liver retractor. I grasped the body  stomach and tented toward the abdominal wall, small left subcostal incision created to placed Gastrostomy tube under  direct visualization.     The patient was positioned  in reverse Trendelenburg, robot was brought to the surgical field and docked in the standard fashion.  We made sure all the instrumentation was kept indirect view at all times and that there were no collision between the arms. I scrubbed out and went to the console. Using the scissors I performed a gastrotomy and confirmed that I was intraluminally. A partial pursestring suture was placed using 3 0 V-Loc suture around the G-tube in an inner fashion. I placed the G-tube through the gastrotomy in direct fashion.  I was able to get my scrub to flush saline via the G-tube confirming the proper position of the tube intraluminally.  The balloon was inflated and pulled back.  We then were able to tied the first pursestring in the standard fashion and completing our circumferential outer pursestring with the 2 V-Loc sutures Inspection of the  upper quadrant was performed. No bleeding, bile duct injury or leak, or bowel injury was noted.  All the needles were removed under direct visualization. Robotic instruments and robotic arms were undocked in the standard fashion.  I scrubbed back in. Liposomal Marcaine was used to infiltrate the abdominal wall at all incision sites. 3-0 nylon used to secure the G tube bolster to the abdominal wall.   Pneumoperitoneum was released.   4-0 subcuticular Monocryl was used to close the skin. Dermabond was  applied.  The patient was then extubated and brought to the recovery room in stable condition. Sponge, lap, and needle counts were correct at closure and at the conclusion of the case.  May start using G tube tomorrow AM      Sterling Big, MD, FACS

## 2020-07-22 NOTE — Anesthesia Postprocedure Evaluation (Signed)
Anesthesia Post Note  Patient: Nicholas Valdez  Procedure(s) Performed: XI ROBOT ASSISTED GASTROSTOMY TUBE PLACEMENT (N/A )  Patient location during evaluation: SICU Anesthesia Type: General Level of consciousness: sedated Pain management: pain level controlled Vital Signs Assessment: post-procedure vital signs reviewed and stable Respiratory status: patient on ventilator - see flowsheet for VS (trach) Cardiovascular status: stable Postop Assessment: no apparent nausea or vomiting Anesthetic complications: no   No complications documented.   Last Vitals:  Vitals:   07/22/20 1100 07/22/20 1200  BP: 119/80 133/88  Pulse: 67 69  Resp: (!) 22 (!) 22  Temp:  (!) 36.4 C  SpO2: 97% 100%    Last Pain:  Vitals:   07/22/20 0400  TempSrc: Axillary  PainSc:                  Cleda Mccreedy Charlise Giovanetti

## 2020-07-22 NOTE — Progress Notes (Signed)
Spoke with patients mother, Abrahm Mancia, concerning PEG tube placement scheduled for tomorrow morning. Clydie Braun confirms that Dr Everlene Farrier has explained the procedure and patients mother verbalized understanding, and then gave consent over the telephone for a PEG tube to be placed. Witnessed by 2nd RN Shela Leff.

## 2020-07-22 NOTE — Progress Notes (Addendum)
NAME:  Nicholas Valdez, MRN:  [id number], DOB:  1981-04-23, LOS: 33 ADMISSION DATE:  06/19/2020,   Brief History   39 year old male with past medical history significant for EtOH and polysubstance abuse admitted with acute metabolic encephalopathy secondary to alcohol withdrawal and acute hypoxic respiratory failure due to encephalopathy and aspiration pneumonia.  Status post tracheostomy.  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 Despite decreasing versed to 7 and completely stopping Propofol patient is only partially arousable. Will continue to wean sedation to RASS-0 with plan for SBT. 06/24/20- patient is being weaned off sedation as possible.  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  12/1- patient became severely aggitated, tachypneic, tachycardic and with heavy endotracheal secretions. He is febrile 12/2-Patient with extremely high tolerance to sedation have been unable to bring down dosing. Reviewed care plan with mother of patient Clydie Braun - plan for trache - ENT consult placed. 07/07/2020:Tracheostomy planned for 12/6, remains encephalopathic, does not follow commands, agitation when sedation is lightened. 07/08/2020:Moves all fours, still on vent, less agitated 12/6TRACH placed by ENT 12/8wean sedation, assess for PS trials 12/8 ELIZABETHKINGIA SPECIES in TA started ABX 12/13 severe agitation 12/14  severe agitation 12/16: Weaning sedation as able, will plan on PS/trach collar trials 12/17: Repeat Tracheal aspirate w/ ELIZABETHKINGIA SPECIES, ID Consulted, General Surgery consulted for PEG 12/18: PEG tube placement  Consults:  PCCM Psychiatry IR Infectious disease General surgery  Procedures:  11/16: ETT 11/16: CVC 3L>>will need PICC line 12/6: Tracheostomy placed by ENT  Significant Diagnostic Tests:  11/15: CT Head>> negative 11/16: US Abdomen>>. Mild hepatic steatosis 11/17: Echocardiogram>> LVEF 65-70%, no wall motion abnormalities, no other abnormalities noted 12/16: CT Abdomen/Pelvis>>Bibasilar pulmonary infiltrates, likely infectious or inflammatory and suggestive of atypical infection or aspiration. Mild hepatosplenomegaly. Nasogastric tube extends into the mid to distal body of the stomach. In this region, there is an appropriate tract for potential percutaneous access of the stomach. 12/17: CT head>>1. No intracranial abnormality. 2. Bilateral mastoid effusions and fluid in the middle ears. 3. Mucosal inflammatory changes of the paranasal sinuses. 12/17: CT chest>> multifocal airspace disease, tracheostomy in stable position.  Micro Data:  11/15: COVID-19 PCR>> negative 11/15: Influenza PCR>> negative 11/16: MRSA PCR>> negative 11/16: Tracheal aspirate>>negative 11/16: HIV screen>>nonreactive 11/18: Sputum>> no growth 11/21: Sputum>>rare normal respiratory flora 12/1: Sputum >> Normal respiratory flora 12/6: Tracheal aspirate>>ELIZABETHKINGIA MENINGOSEPTICA  12/6: MRSA PCR>>negative 12/14: Tracheal aspirate>> FEW ELIZABETHKINGIA MIRICOLA (susceptiblities to follow)  Antimicrobials:  Unasyn 11/17>>11/23; 11/29>>12/3 Vancomycin 11/21 x1 dose Fluconazole 12/1>>12/8 Minocycline 12/8>>12/14  Ciprofloxacin 12/17 >>   CC  follow up resp failure  INTERVAL HISTORY No acute events.  Continues on propofol drip  Objective   Blood pressure (!) 131/93,  pulse 72, temperature 98.7 F (37.1 C), resp. rate (!) 22, height 5\' 10"  (1.778 m), weight (!) 144.3 kg, SpO2 100 %.    Vent Mode: PRVC FiO2 (%):  [35 %] 35 % Set Rate:  [22 bmp] 22 bmp Vt Set:  [550 mL] 550  mL PEEP:  [5 cmH20] 5 cmH20 Pressure Support:  [14 cmH20] 14 cmH20 Plateau Pressure:  [16 cmH20] 16 cmH20   Intake/Output Summary (Last 24 hours) at 07/22/2020 1015 Last data filed at 07/22/2020 0950 Gross per 24 hour  Intake 2204.3 ml  Output 1965 ml  Net 239.3 ml   Filed Weights   07/20/20 0411 07/21/20 0349 07/22/20 0500  Weight: (!) 145 kg (!) 144.6 kg (!) 144.3 kg    REVIEW OF SYSTEMS  PATIENT IS UNABLE TO PROVIDE COMPLETE REVIEW OF SYSTEM S DUE TO SEVERE CRITICAL ILLNESS AND ENCEPHALOPATHY  PHYSICAL EXAMINATION: Gen:      Chronically ill-appearing HEENT:  EOMI, sclera anicteric Neck:     No masses; no thyromegaly, trach Lungs:    Clear to auscultation bilaterally; normal respiratory effort CV:         Regular rate and rhythm; no murmurs Abd:      + bowel sounds; soft, non-tender; no palpable masses, no distension Ext:    No edema; adequate peripheral perfusion Skin:      Warm and dry; no rash Neuro: Dated  Labs/imaging personally reviewed Significant for potassium 3.3, BUN/creatinine 16/0.79, WBC 9, hemoglobin 8.8 No new imaging  Assessment & Plan:    Acute Hypoxic Respiratory failure secondary to acute encephalopathy due to Alcohol withdrawal s/p trach +Acute Hypoxic Respiratory failure secondary to acute encephalopathy due to Alcohol withdrawal +ELIZABETHKINGIA SPECIES in TA started ABX Pneumonia, failure to wean from vent and severe agitation from ETOH abuse with signs and sympoms of brain damage at micro cellular level   Ventilator Dependent Respiratory Failure secondary to Acute Encephalopathy due to ETOH withdrawal and Aspiration Pneumonia Continue vent support Wean PEEP/FiO2 Intermittent chest x-ray Bronchodilators as needed   Aspiration  Pneumonia>>treated with Unasyn Ventilator Associated Pneumonia (12/6: Tracheal aspirate>>ELIZABETHKINGIA MENINGOSEPTICA)>>treated with Minocycline Appreciate ID consult Started on ciprofloxacin  Acute Toxic Metabolic Encephalopathy -Provide supportive care Off Dilaudid drip.  Wean down propofol as tolerated. -Continue scheduled oxycodone and Valium, fentanyl patch, Seroquel increased to 100 mg twice daily -Psych following, appreciate input CT head with no acute abnormality.   Anemia without s/sx of bleeding Monitor labs, transfuse for hemoglobin less than seven  Hyperglycemia SSI   GI -GI PROPHYLAXIS as indicated   NUTRITIONAL STATUS/DIET -Dietician following, TF's as tolerated -Constipation protocol as indicated -General surgery consulted for PEG tube placement, tentative plan for PEG on 12/18   BEST PRACTICES DISPOSITION: ICU GOALS OF CARE: Full Code VTE PROPHYLAXIS: Lovenox SUP: Protonix UPDATES: Updated Mom on 12/18  The patient is critically ill with multiple organ system failure and requires high complexity decision making for assessment and support, frequent evaluation and titration of therapies, advanced monitoring, review of radiographic studies and interpretation of complex data.   Critical Care Time devoted to patient care services, exclusive of separately billable procedures, described in this note is 35 minutes.   Chilton Greathouse MD [facility] Please see [email or web address] for pager details.  07/22/2020, 10:23 AM

## 2020-07-23 ENCOUNTER — Inpatient Hospital Stay: Payer: Medicaid Other

## 2020-07-23 LAB — GLUCOSE, CAPILLARY
Glucose-Capillary: 126 mg/dL — ABNORMAL HIGH (ref 70–99)
Glucose-Capillary: 91 mg/dL (ref 70–99)
Glucose-Capillary: 106 mg/dL — ABNORMAL HIGH (ref 70–99)
Glucose-Capillary: 115 mg/dL — ABNORMAL HIGH (ref 70–99)
Glucose-Capillary: 107 mg/dL — ABNORMAL HIGH (ref 70–99)

## 2020-07-23 LAB — BASIC METABOLIC PANEL
Anion gap: 12 (ref 5–15)
BUN: 18 mg/dL (ref 6–20)
CO2: 26 mmol/L (ref 22–32)
Calcium: 9 mg/dL (ref 8.9–10.3)
Chloride: 100 mmol/L (ref 98–111)
Creatinine, Ser: 0.74 mg/dL (ref 0.61–1.24)
GFR, Estimated: >60 mL/min (ref >60)
Glucose, Bld: 126 mg/dL — ABNORMAL HIGH (ref 70–99)
Potassium: 3.7 mmol/L (ref 3.5–5.1)
Sodium: 138 mmol/L (ref 135–145)

## 2020-07-23 LAB — MAGNESIUM: Magnesium: 2 mg/dL (ref 1.7–2.4)

## 2020-07-23 LAB — PHOSPHORUS: Phosphorus: 6.4 mg/dL — ABNORMAL HIGH (ref 2.5–4.6)

## 2020-07-23 MED ORDER — SCOPOLAMINE 1 MG/3DAYS TD PT72
1.0000 | MEDICATED_PATCH | TRANSDERMAL | Status: DC
  Administered 2020-07-23 – 2020-08-01 (×4): 1.5 mg via TRANSDERMAL
  Filled 2020-07-23 (×6): qty 1

## 2020-07-23 MED ORDER — ZIPRASIDONE MESYLATE 20 MG IM SOLR
20.0000 mg | Freq: Two times a day (BID) | INTRAMUSCULAR | Status: DC
  Administered 2020-07-23 – 2020-07-27 (×10): 20 mg via INTRAMUSCULAR
  Filled 2020-07-23 (×10): qty 20

## 2020-07-23 MED ORDER — GLYCOPYRROLATE 0.2 MG/ML IJ SOLN
0.3000 mg | Freq: Three times a day (TID) | INTRAMUSCULAR | Status: DC
  Administered 2020-07-23: 0.3 mg via INTRAVENOUS

## 2020-07-23 MED ORDER — GLYCOPYRROLATE 0.2 MG/ML IJ SOLN
0.2000 mg | Freq: Three times a day (TID) | INTRAMUSCULAR | Status: DC
  Administered 2020-07-23 – 2020-07-27 (×12): 0.2 mg via INTRAVENOUS
  Filled 2020-07-23 (×12): qty 1

## 2020-07-23 NOTE — Plan of Care (Addendum)
RN collaborated with MD and initiated TCTs today. Trach collar trial from 1115 hrs until 1315 hrs, patient presented with severe agitation and copious secretions but overall tolerated well. Plan for TCT again tomorrow. Will try to avoid restarting Propofol infusion if possible.  Okay to use PEG per Dr. Everlene Farrier, so NGT removed.   Patient's family member, Quentin Mulling, updated and we discussed the next steps and plan of care for the patient.    Problem: Education: Goal: Knowledge of General Education information will improve Description: Including pain rating scale, medication(s)/side effects and non-pharmacologic comfort measures Outcome: Not Progressing   Problem: Health Behavior/Discharge Planning: Goal: Ability to manage health-related needs will improve Outcome: Not Progressing   Problem: Clinical Measurements: Goal: Ability to maintain clinical measurements within normal limits will improve Outcome: Not Progressing Goal: Will remain free from infection Outcome: Not Progressing Goal: Diagnostic test results will improve Outcome: Not Progressing Goal: Respiratory complications will improve Outcome: Not Progressing Goal: Cardiovascular complication will be avoided Outcome: Not Progressing   Problem: Activity: Goal: Risk for activity intolerance will decrease Outcome: Not Progressing   Problem: Nutrition: Goal: Adequate nutrition will be maintained Outcome: Not Progressing   Problem: Coping: Goal: Level of anxiety will decrease Outcome: Not Progressing   Problem: Elimination: Goal: Will not experience complications related to bowel motility Outcome: Not Progressing Goal: Will not experience complications related to urinary retention Outcome: Not Progressing   Problem: Pain Managment: Goal: General experience of comfort will improve Outcome: Not Progressing   Problem: Safety: Goal: Ability to remain free from injury will improve Outcome: Not Progressing   Problem: Skin  Integrity: Goal: Risk for impaired skin integrity will decrease Outcome: Not Progressing

## 2020-07-23 NOTE — Progress Notes (Signed)
NAME:  Nicholas Valdez, MRN:  [id number], DOB:  1981-04-26, LOS: 34 ADMISSION DATE:  06/19/2020,   Brief History   39 year old male with past medical history significant for EtOH and polysubstance abuse admitted with acute metabolic encephalopathy secondary to alcohol withdrawal and acute hypoxic respiratory failure due to encephalopathy and aspiration pneumonia.  Status post tracheostomy.  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 Despite decreasing versed to 7 and completely stopping Propofol patient is only partially arousable. Will continue to wean sedation to RASS-0 with plan for SBT. 06/24/20- patient is being weaned off sedation as possible.  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  12/1- patient became severely aggitated, tachypneic, tachycardic and with heavy endotracheal secretions. He is febrile 12/2-Patient with extremely high tolerance to sedation have been unable to bring down dosing. Reviewed care plan with mother of patient Clydie Braun - plan for trache - ENT consult placed. 07/07/2020:Tracheostomy planned for 12/6, remains encephalopathic, does not follow commands, agitation when sedation is lightened. 07/08/2020:Moves all fours, still on vent, less agitated 12/6TRACH placed by ENT 12/8 ELIZABETHKINGIA SPECIES in [place] started ABX 12/17: Repeat Tracheal aspirate w/ ELIZABETHKINGIA SPECIES, ID  Consulted, General Surgery consulted for PEG 12/18: PEG tube placement  Consults:  PCCM Psychiatry IR Infectious disease General surgery  Procedures:  11/16: ETT 11/16: CVC 3L>>will need PICC line 12/6: Tracheostomy placed by ENT 12/18 PEG tube  Significant Diagnostic Tests:  11/15: CT Head>> negative 11/16: US Abdomen>>. Mild hepatic steatosis 11/17: Echocardiogram>> LVEF 65-70%, no wall motion abnormalities, no other abnormalities noted 12/16: CT Abdomen/Pelvis>>Bibasilar pulmonary infiltrates, likely infectious or inflammatory and suggestive of atypical infection or aspiration. Mild hepatosplenomegaly. Nasogastric tube extends into the mid to distal body of the stomach. In this region, there is an appropriate tract for potential percutaneous access of the stomach. 12/17: CT head>>1. No intracranial abnormality. 2. Bilateral mastoid effusions and fluid in the middle ears. 3. Mucosal inflammatory changes of the paranasal sinuses. 12/17: CT chest>> multifocal airspace disease, tracheostomy in stable position.  Micro Data:  11/15: COVID-19 PCR>> negative 11/15: Influenza PCR>> negative 11/16: MRSA PCR>> negative 11/16: Tracheal aspirate>>negative 11/16: HIV screen>>nonreactive 11/18: Sputum>> no growth 11/21: Sputum>>rare normal respiratory flora 12/1: Sputum >> Normal respiratory flora 12/6: Tracheal aspirate>>ELIZABETHKINGIA MENINGOSEPTICA  12/6: MRSA PCR>>negative 12/14: Tracheal aspirate>> FEW ELIZABETHKINGIA MIRICOLA (susceptiblities to follow)  Antimicrobials:  Unasyn 11/17>>11/23; 11/29>>12/3 Vancomycin 11/21 x1 dose Fluconazole 12/1>>12/8 Minocycline 12/8>>12/14  Ciprofloxacin 12/17 >>   CC  follow up resp failure  INTERVAL HISTORY No acute events.  Continue sedation and propofol Has significant agitation when sedation is being  Objective   Blood pressure (!) 94/55, pulse 85, temperature 98.4 F (36.9 C), temperature source Axillary, resp. rate (!)  22, height 5\' 10"  (1.778 m), weight (!) 144.3 kg, SpO2 99 %.    Vent Mode: PRVC FiO2 (%):  [35 %] 35 % Set Rate:  [22 bmp] 22 bmp Vt Set:  [550 mL] 550 mL PEEP:  [5 cmH20] 5 cmH20 Plateau Pressure:  [  16 cmH20-20 cmH20] 20 cmH20   Intake/Output Summary (Last 24 hours) at 07/23/2020 1008 Last data filed at 07/23/2020 0900 Gross per 24 hour  Intake 3337.42 ml  Output 3170 ml  Net 167.42 ml   Filed Weights   07/20/20 0411 07/21/20 0349 07/22/20 0500  Weight: (!) 145 kg (!) 144.6 kg (!) 144.3 kg    REVIEW OF SYSTEMS  PATIENT IS UNABLE TO PROVIDE COMPLETE REVIEW OF SYSTEM S DUE TO SEVERE CRITICAL ILLNESS AND ENCEPHALOPATHY  PHYSICAL EXAMINATION: Gen:      No acute distress, chronically ill-appearing HEENT:  EOMI, sclera anicteric Neck:     No masses; no thyromegaly, trach Lungs:    Clear to auscultation bilaterally; normal respiratory effort CV:         Regular rate and rhythm; no murmurs Abd:      + bowel sounds; soft, non-tender; no palpable masses, no distension Ext:    No edema; adequate peripheral perfusion Skin:      Warm and dry; no rash Neuro: Sedated  Labs/imaging personally reviewed Metabolic panel stable, hemoglobin slightly reduced to 8.8   Assessment & Plan:    Acute Hypoxic Respiratory failure secondary to acute encephalopathy due to Alcohol withdrawal s/p trach +Acute Hypoxic Respiratory failure secondary to acute encephalopathy due to Alcohol withdrawal +ELIZABETHKINGIA SPECIES in [place] started ABX Pneumonia, failure to wean from vent and severe agitation from ETOH abuse with signs and sympoms of brain damage at micro cellular level   Ventilator Dependent Respiratory Failure secondary to Acute Encephalopathy due to ETOH withdrawal and Aspiration Pneumonia Continue vent support Wean PEEP/FiO2 Intermittent chest x-ray Bronchodilators as needed   Aspiration Pneumonia>>treated with Unasyn Ventilator Associated Pneumonia (12/6: Tracheal  aspirate>>ELIZABETHKINGIA MENINGOSEPTICA)>>treated with Minocycline Appreciate ID consult Started on ciprofloxacin  Acute Toxic Metabolic Encephalopathy -Provide supportive care Off Dilaudid drip.  Wean down propofol as tolerated. -Continue scheduled oxycodone and Valium, fentanyl patch, Seroquel increased to 100 mg twice daily -Psych following, appreciate input CT head with no acute abnormality.   Anemia without s/sx of bleeding Monitor labs, transfuse for hemoglobin less than seven  Hyperglycemia SSI   GI -GI PROPHYLAXIS as indicated   NUTRITIONAL STATUS/DIET -Dietician following, TF's as tolerated -Constipation protocol as indicated -General surgery consulted for PEG tube placement, tentative plan for PEG on 12/18   BEST PRACTICES DISPOSITION: ICU GOALS OF CARE: Full Code VTE PROPHYLAXIS: Lovenox SUP: Protonix UPDATES: Updated Mom on 12/19  The patient is critically ill with multiple organ system failure and requires high complexity decision making for assessment and support, frequent evaluation and titration of therapies, advanced monitoring, review of radiographic studies and interpretation of complex data.   Critical Care Time devoted to patient care services, exclusive of separately billable procedures, described in this note is 35 minutes.   Chilton Greathouse MD [facility] Please see [email or web address] for pager details.  07/23/2020, 10:08 AM

## 2020-07-23 NOTE — Progress Notes (Signed)
PHARMACY CONSULT NOTE  Pharmacy Consult for Electrolyte Monitoring and Replacement   Recent Labs: Potassium (mmol/L)  Date Value  07/23/2020 3.7  02/12/2013 3.9   Magnesium (mg/dL)  Date Value  09/73/5329 2.0   Calcium (mg/dL)  Date Value  92/42/6834 9.0   Calcium, Total (mg/dL)  Date Value  19/62/2297 8.7   Albumin (g/dL)  Date Value  98/92/1194 3.2 (L)  02/12/2013 3.6   Phosphorus (mg/dL)  Date Value  17/40/8144 6.4 (H)   Sodium (mmol/L)  Date Value  07/23/2020 138  02/12/2013 139   Assessment: 39 year old male here with encephalopathy s/t alcohol withdrawal. Patient has been intubated and sedated since 11/16. 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 Pharmacy to manage electrolytes.  Tube feeds: Vital 1.5 Cal at 50 mL/hr  + PROSource TF 90 mL 3 times daily per tube  Free water: 200 mL every 4 hours (d/c 12/2)  Diuretics: IV Lasix 40 mg daily (started 12/1, d/c 12/9)  Bowel regimen: Lactulose 20 g BID (d/c 12/2). Colace 100 mg BID + Miralax 17 g daily (started 12/14   Goal of Therapy:  Electrolytes WNL  Plan:   No replacement needed at this time.    Will continue to follow electrolytes   Ronnald Ramp, PharmD, BCPS 07/23/2020 8:15 AM

## 2020-07-23 NOTE — Progress Notes (Signed)
POD # 1  Doing well from G tube Abd benign, G tube in place no infection or leak May start TF today and DC NGT Ok to use elixir meds via g tube We will be available

## 2020-07-24 ENCOUNTER — Encounter: Payer: Self-pay | Admitting: Internal Medicine

## 2020-07-24 DIAGNOSIS — R509 Fever, unspecified: Secondary | ICD-10-CM | POA: Diagnosis not present

## 2020-07-24 DIAGNOSIS — H748X3 Other specified disorders of middle ear and mastoid, bilateral: Secondary | ICD-10-CM

## 2020-07-24 DIAGNOSIS — A488 Other specified bacterial diseases: Secondary | ICD-10-CM | POA: Diagnosis not present

## 2020-07-24 DIAGNOSIS — F10231 Alcohol dependence with withdrawal delirium: Secondary | ICD-10-CM | POA: Diagnosis not present

## 2020-07-24 DIAGNOSIS — J189 Pneumonia, unspecified organism: Secondary | ICD-10-CM | POA: Diagnosis not present

## 2020-07-24 DIAGNOSIS — Z930 Tracheostomy status: Secondary | ICD-10-CM

## 2020-07-24 DIAGNOSIS — Y95 Nosocomial condition: Secondary | ICD-10-CM

## 2020-07-24 LAB — CBC
HCT: 25 % — ABNORMAL LOW (ref 39.0–52.0)
Hemoglobin: 8.5 g/dL — ABNORMAL LOW (ref 13.0–17.0)
MCH: 33.7 pg (ref 26.0–34.0)
MCHC: 34 g/dL (ref 30.0–36.0)
MCV: 99.2 fL (ref 80.0–100.0)
Platelets: 300 10*3/uL (ref 150–400)
RBC: 2.52 MIL/uL — ABNORMAL LOW (ref 4.22–5.81)
RDW: 14.2 % (ref 11.5–15.5)
WBC: 11.4 10*3/uL — ABNORMAL HIGH (ref 4.0–10.5)
nRBC: 0.3 % — ABNORMAL HIGH (ref 0.0–0.2)

## 2020-07-24 LAB — GLUCOSE, CAPILLARY
Glucose-Capillary: 102 mg/dL — ABNORMAL HIGH (ref 70–99)
Glucose-Capillary: 103 mg/dL — ABNORMAL HIGH (ref 70–99)
Glucose-Capillary: 108 mg/dL — ABNORMAL HIGH (ref 70–99)
Glucose-Capillary: 110 mg/dL — ABNORMAL HIGH (ref 70–99)
Glucose-Capillary: 115 mg/dL — ABNORMAL HIGH (ref 70–99)
Glucose-Capillary: 118 mg/dL — ABNORMAL HIGH (ref 70–99)
Glucose-Capillary: 124 mg/dL — ABNORMAL HIGH (ref 70–99)

## 2020-07-24 LAB — BASIC METABOLIC PANEL
Anion gap: 9 (ref 5–15)
BUN: 16 mg/dL (ref 6–20)
CO2: 29 mmol/L (ref 22–32)
Calcium: 8.8 mg/dL — ABNORMAL LOW (ref 8.9–10.3)
Chloride: 101 mmol/L (ref 98–111)
Creatinine, Ser: 0.75 mg/dL (ref 0.61–1.24)
GFR, Estimated: >60 mL/min (ref >60)
Glucose, Bld: 110 mg/dL — ABNORMAL HIGH (ref 70–99)
Potassium: 3.3 mmol/L — ABNORMAL LOW (ref 3.5–5.1)
Sodium: 139 mmol/L (ref 135–145)

## 2020-07-24 LAB — PHOSPHORUS: Phosphorus: 4.6 mg/dL (ref 2.5–4.6)

## 2020-07-24 LAB — MAGNESIUM: Magnesium: 1.7 mg/dL (ref 1.7–2.4)

## 2020-07-24 LAB — TRIGLYCERIDES: Triglycerides: 173 mg/dL — ABNORMAL HIGH (ref <150)

## 2020-07-24 MED ORDER — VALPROIC ACID 250 MG/5ML PO SOLN
250.0000 mg | Freq: Two times a day (BID) | ORAL | Status: DC
  Administered 2020-07-24 – 2020-07-25 (×3): 250 mg
  Filled 2020-07-24 (×5): qty 5

## 2020-07-24 MED ORDER — POTASSIUM CHLORIDE 20 MEQ PO PACK
40.0000 meq | PACK | Freq: Once | ORAL | Status: AC
  Administered 2020-07-24: 40 meq
  Filled 2020-07-24: qty 2

## 2020-07-24 MED ORDER — PROSOURCE TF PO LIQD
45.0000 mL | Freq: Three times a day (TID) | ORAL | Status: DC
  Administered 2020-07-24 – 2020-07-25 (×3): 45 mL
  Filled 2020-07-24 (×5): qty 45

## 2020-07-24 MED ORDER — HALOPERIDOL LACTATE 5 MG/ML IJ SOLN
2.0000 mg | Freq: Once | INTRAMUSCULAR | Status: AC
  Administered 2020-07-24: 2 mg via INTRAVENOUS
  Filled 2020-07-24: qty 1

## 2020-07-24 MED ORDER — VITAL 1.5 CAL PO LIQD
1000.0000 mL | ORAL | Status: DC
  Administered 2020-07-24: 1000 mL

## 2020-07-24 NOTE — Progress Notes (Signed)
Nutrition Follow-up  DOCUMENTATION CODES:   Morbid obesity  INTERVENTION:   Initiate Vital 1.5 Cal at 70 mL/hr + PROSource TF 45 mL 3 times daily per tube.   Free water flushes 68m q4 hours to maintain tube patency   Goal regimen provides 2640 kcal, 147 grams of protein, 1464 mL H2O daily.  NUTRITION DIAGNOSIS:   Inadequate oral intake related to inability to eat as evidenced by NPO status. Ongoing.  GOAL:   Patient will meet greater than or equal to 90% of their needs Met with TF regimen.  MONITOR:   Vent status,Labs,Weight trends,TF tolerance,I & O's  ASSESSMENT:   39year old male with PMHx of EtOH abuse, polysubstance abuse, hepatitis C admitted with AMS, acute encephalopathy, EtOH withdrawal, suspected aspiration PNA, AKI, sepsis.  Pt s/p tracheostomy tube placement 12/6 Pt s/p surgical placed G-tube 12/18  Pt remains sedated and ventilated; pt agitated at times. Pt NPO for G-tube placement 12/18; per surgery, ok to use tube today. Pt with pressure injury to L heel. Per chart, pt down 32lbs but appears to be back at his UBW.   Medications reviewed and include: colace, lovenox, folic acid, melatonin, insulin, MVI, oxycodone, protonix, miralax, KCl, thiamine, B12, ciprofloxacin  Labs reviewed: K 3.3(L), P 4.6 wnl, Mg 1.7 wnl Wbc- 11.4(H), Hgb 8.5(L), Hct 25.0(L)  Patient is currently intubated on ventilator support MV: 18.8 L/min Temp (24hrs), Avg:100 F (37.8 C), Min:99 F (37.2 C), Max:100.3 F (37.9 C)  Propofol: none   MAP- >666mg  UOP- 2,33011m Diet Order:   Diet Order            Diet NPO time specified  Diet effective midnight                EDUCATION NEEDS:   No education needs have been identified at this time  Skin:  closed incision to neck, L heel wound   Last BM:  12/17- type 7  Height:   Ht Readings from Last 1 Encounters:  07/19/20 5' 10" (1.778 m)   Weight:   Wt Readings from Last 1 Encounters:  07/22/20 (!) 144.3 kg    Ideal Body Weight:  75.5 kg  BMI:  Body mass index is 45.65 kg/m.  Estimated Nutritional Needs:   Kcal:  2653  Protein:  145-155 grams  Fluid:  >/= 2.2 L/day  CasKoleen Distance, RD, LDN Please refer to [facility] and/or RD on-call/weekend/after hours pager

## 2020-07-24 NOTE — Progress Notes (Signed)
PHARMACY CONSULT NOTE  Pharmacy Consult for Electrolyte Monitoring and Replacement   Recent Labs: Potassium (mmol/L)  Date Value  07/24/2020 3.3 (L)  02/12/2013 3.9   Magnesium (mg/dL)  Date Value  40/98/1191 1.7   Calcium (mg/dL)  Date Value  47/82/9562 8.8 (L)   Calcium, Total (mg/dL)  Date Value  13/03/6577 8.7   Albumin (g/dL)  Date Value  46/96/2952 3.2 (L)  02/12/2013 3.6   Phosphorus (mg/dL)  Date Value  84/13/2440 4.6   Sodium (mmol/L)  Date Value  07/24/2020 139  02/12/2013 139   Assessment: 39 year old male here with encephalopathy s/t alcohol withdrawal. Patient has been intubated and sedated since 11/16. 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 Pharmacy to manage electrolytes.    Goal of Therapy:  Electrolytes WNL  Plan:   Potassium 40 mEq per tube for one dose.   Will continue to follow electrolytes   Pricilla Riffle, PharmD 07/24/2020 2:59 PM

## 2020-07-24 NOTE — Progress Notes (Addendum)
Date of Admission:  06/19/2020      ID: Nicholas Valdez is a 39 y.o. male Principal Problem:   Alcohol withdrawal delirium ([place]) Active Problems:   Increased ammonia level   Acute metabolic encephalopathy   Elevated LFTs   Tobacco abuse   Elevated lactic acid level   Acute respiratory failure with hypoxia ([place])  Nicholas Valdez is a 39 y.o. male with a history of alcohol and polysubstance use presented to ED on 11/16 with altered mental status Following is taken fom ICU note  06/20/20 Admit to ICU, intubated 06/23/20-patient unable to have SBT today due to sedation. .06/25/20-issues with hypoxia and asynchrony, patient has been intermittently febrile. 11/25severe hypoxia, resp failure, failure to wean from vent. 11/26-, severe DT .11/27 failure to wean from ven. 11/29-patient is difficult to wean with sedation, slight decrease in dilaudid patient tried to leave while intubated. 12/2-Patient with extremely high tolerance to sedation have been unable to bring down dosing. .07/07/2020:Tracheostomy planned for 12/6, remains encephalopathic, does not follow commands, agitation when sedation is lightened . 12/6TRACH placed by ENT.  Marland Kitchen12/6 ELIZABETHKINGIA.SPECIES in [place] started ABX minocycline on 12/8  12/14 severe agitation  I am asked to see him as the repeat    11/15: COVID-19 PCR>> negative 11/15: Influenza PCR>> negative 11/16: MRSA PCR>> negative 11/16: Tracheal aspirate>>negative 11/16: HIV screen>>nonreactive 11/18: Tracheal aspirate>> no growth 11/21:Tracheal aspirate >rare normal respiratory flora 12/1: Tracheal aspirate  >> Normal respiratory flora 12/6: Tracheal aspirate>>ELIZABETHKINGIA MENINGOSEPTICA 12/6: MRSA PCR>>negative 12/14: Tracheal aspirate>>Elizabethia  Antimicrobials Unasyn 11/17>>11/23; 11/29>>12/3 Fluconazole 12/1>>12/8 Vancomycin 11/21 >.11/23 Minocycline 12/8>>12/14 cipro 12/17>>  Subjective: Unresponsive but agitated  since he was taken off sedation Had PEG placement on 07/22/20 As per his nurse lot of resp secretions- tube feeds on hold  Medications:  . chlorhexidine gluconate (MEDLINE KIT)  15 mL Mouth Rinse BID  . Chlorhexidine Gluconate Cloth  6 each Topical Daily  . docusate  100 mg Per Tube BID  . enoxaparin (LOVENOX) injection  0.5 mg/kg Subcutaneous Q24H  . feeding supplement (PROSource TF)  45 mL Per Tube TID  . fentaNYL  1 patch Transdermal Q72H  . folic acid  1 mg Per Tube Daily  . gabapentin  600 mg Per Tube Q8H  . glycopyrrolate  0.2 mg Intravenous TID  . haloperidol lactate  2 mg Intravenous Q6H  . haloperidol lactate  2 mg Intravenous Once  . insulin aspart  0-15 Units Subcutaneous Q4H  . mouth rinse  15 mL Mouth Rinse 10 times per day  . melatonin  5 mg Per Tube QHS  . multivitamin with minerals  1 tablet Per Tube Daily  . nystatin   Topical BID  . oxyCODONE  10 mg Per Tube Q4H  . pantoprazole sodium  40 mg Per Tube Daily  . polyethylene glycol  17 g Per Tube Daily  . polyvinyl alcohol  1 drop Both Eyes BID  . QUEtiapine  100 mg Per Tube BID  . scopolamine  1 patch Transdermal Q72H  . thiamine  100 mg Per Tube Daily  . valproic acid  250 mg Per Tube BID  . vitamin B-12  1,000 mcg Per Tube Daily  . ziprasidone  20 mg Intramuscular BID    Objective: Vital signs in last 24 hours: Temp:  [98.9 F (37.2 C)-100.3 F (37.9 C)] 100.1 F (37.8 C) (12/20 1700) Pulse Rate:  [98-123] 116 (12/20 1800) Resp:  [18-34] 27 (12/20 1800) BP: (101-160)/(64-115) 101/85 (12/20 1800) SpO2:  [  96 %-100 %] 98 % (12/20 1800) FiO2 (%):  [35 %] 35 % (12/20 1200)  PHYSICAL EXAM:  General: unresponsive  Head: Normocephalic, without obvious abnormality, atraumatic. Eyes: Conjunctivae clear, anicteric sclerae. Pupils are equal Lungs: Clear to auscultation bilaterally. No Wheezing or Rhonchi. No rales. Heart: Regular rate and rhythm, no murmur, rub or gallop. Abdomen: Soft,peg in  place Extremities: edema  Skin: No rashes or lesions. Or bruising Lymph: Cervical, supraclavicular normal. Neurologic:cannot examine  Lab Results Recent Labs    07/22/20 0418 07/23/20 0504 07/24/20 0510  WBC 9.0  --  11.4*  HGB 8.8*  --  8.5*  HCT 26.6*  --  25.0*  NA 140 138 139  K 3.3* 3.7 3.3*  CL 100 100 101  CO2 _0 BUN _1 CREATININE 0.79 0.74 0.75   Liver Panel No results for input(s): PROT, ALBUMIN, AST, ALT, ALKPHOS, BILITOT, BILIDIR, IBILI in the last 72 hours. Sedimentation Rate No results for input(s): ESRSEDRATE in the last 72 hours. C-Reactive Protein No results for input(s): CRP in the last 72 hours.  Microbiology:  Studies/Results: DG Chest Port 1 View  Result Date: 07/23/2020 CLINICAL DATA:  Acute respiratory failure EXAM: PORTABLE CHEST 1 VIEW COMPARISON:  July 18, 2020 FINDINGS: The tracheostomy tube is in good position. The NG tube terminates below today's film. No pneumothorax. The cardiomediastinal silhouette is stable. No overt edema. No nodules or masses. No focal infiltrates. IMPRESSION: 1. Support apparatus as above. 2. No acute abnormalities are identified. Electronically Signed   By: Dorise Bullion III M.D   On: 07/23/2020 12:54     Assessment/Plan: 39 yr male with etoh abuse, polysubstance use presented on 06/19/20 to the ED with alcohol intoxication and altered mental status Pt was intubated on 11/16 and admitted to ICU He has been difficult to extubate because of metal status changes and underwent tracheostomy on 07/10/20?  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

## 2020-07-24 NOTE — Progress Notes (Addendum)
Patient acted out all day slinging his feet out of the bed. Father in to visit. Talked with Dr.Aleskerov about prognosis of son. Legs remain restrained bilaterally. Mitts on hands bilaterally. Tube feeding through peg site stopped- unable to keep patients head 30 degrees to prevent aspiration.

## 2020-07-24 NOTE — Progress Notes (Signed)
NAME:  Nicholas Valdez, MRN:  [id number], DOB:  15-Nov-1980, LOS: 35 ADMISSION DATE:  06/19/2020,   Brief History   39 year old male with past medical history significant for EtOH and polysubstance abuse admitted with acute metabolic encephalopathy secondary to alcohol withdrawal and acute hypoxic respiratory failure due to encephalopathy and aspiration pneumonia.  Status post tracheostomy.  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 Despite decreasing versed to 7 and completely stopping Propofol patient is only partially arousable. Will continue to wean sedation to RASS-0 with plan for SBT. 06/24/20- patient is being weaned off sedation as possible.  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  12/1- patient became severely aggitated, tachypneic, tachycardic and with heavy endotracheal secretions. He is febrile 12/2-Patient with extremely high tolerance to sedation have been unable to bring down dosing. Reviewed care plan with mother of patient Clydie Braun - plan for trache - ENT consult placed. 07/07/2020:Tracheostomy planned for 12/6, remains encephalopathic, does not follow commands, agitation when sedation is lightened. 07/08/2020:Moves all fours, still on vent, less agitated 12/6TRACH placed by ENT 12/8 ELIZABETHKINGIA SPECIES in [place] started ABX 12/17: Repeat Tracheal aspirate w/ ELIZABETHKINGIA SPECIES, ID  Consulted, General Surgery consulted for PEG 12/18: PEG tube placement  12/20- patient with agitation, multiple times requiring re-positioning. 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   Consults:  PCCM Psychiatry IR Infectious disease General surgery  Procedures:  11/16: ETT 11/16: CVC 3L>>will need PICC line 12/6: Tracheostomy placed by ENT 12/18 PEG tube  Significant Diagnostic Tests:  11/15: CT Head>> negative 11/16: US Abdomen>>. Mild hepatic steatosis 11/17: Echocardiogram>> LVEF 65-70%, no wall motion abnormalities, no other abnormalities noted 12/16: CT Abdomen/Pelvis>>Bibasilar pulmonary infiltrates, likely infectious or inflammatory and suggestive of atypical infection or aspiration. Mild hepatosplenomegaly. Nasogastric tube extends into the mid to distal body of the stomach. In this region, there is an appropriate tract for potential percutaneous access of the stomach. 12/17: CT head>>1. No intracranial abnormality. 2. Bilateral mastoid effusions and fluid in the middle ears. 3. Mucosal inflammatory changes of the paranasal sinuses. 12/17: CT chest>> multifocal airspace disease, tracheostomy in stable position.  Micro Data:  11/15: COVID-19 PCR>> negative 11/15: Influenza PCR>> negative 11/16: MRSA PCR>> negative 11/16: Tracheal aspirate>>negative 11/16: HIV screen>>nonreactive 11/18: Sputum>> no growth 11/21: Sputum>>rare normal respiratory flora 12/1: Sputum >> Normal respiratory flora 12/6: Tracheal aspirate>>ELIZABETHKINGIA MENINGOSEPTICA  12/6: MRSA PCR>>negative 12/14: Tracheal aspirate>> FEW ELIZABETHKINGIA MIRICOLA (susceptiblities to follow) 07/22/20  FEW FLAVOBACTERIUM MENINGOSEPTICUM   Report Status 07/22/2020 FINAL   Organism ID, Bacteria FLAVOBACTERIUM MENINGOSEPTICUM   Resulting Agency [facility]      Susceptibility   Flavobacterium meningosepticum    MIC    CEFAZOLIN >=64  RESIST... Resistant    CEFTAZIDIME >=64 RESIST... Resistant    CIPROFLOXACIN 0.5 SENSITIVE  Sensitive    GENTAMICIN >=16 RESIST... Resistant    IMIPENEM >=16 RESIST... Resistant    PIP/TAZO >=128 RESIS... Resistant    TRIMETH/SULFA 40 SENSITIVE  Sensitive  Antimicrobials:  Unasyn 11/17>>11/23; 11/29>>12/3 Vancomycin 11/21 x1 dose Fluconazole 12/1>>12/8 Minocycline 12/8>>12/14  Ciprofloxacin 12/17 >>   CC  follow up resp failure  INTERVAL HISTORY No acute events.  Continue sedation and propofol Has significant agitation when sedation is being  Objective   Blood pressure 130/77, pulse 98, temperature 99 F (37.2 C), temperature source Oral, resp. rate 20, height 5\' 10"  (1.778 m), weight (!) 144.3 kg, SpO2 100 %.    Vent Mode: PRVC FiO2 (%):  [35 %] 35 % Set Rate:  [22 bmp] 22 bmp Vt Set:  [550 mL] 550 mL PEEP:  [5 cmH20] 5 cmH20 Pressure Support:  [10 cmH20] 10 cmH20 Plateau Pressure:  [21 cmH20-22 cmH20] 22 cmH20   Intake/Output Summary (Last 24 hours) at 07/24/2020 1053 Last data filed at 07/24/2020 1000 Gross per 24 hour  Intake 638.13 ml  Output 3680 ml  Net -3041.87 ml   Filed Weights   07/20/20 0411 07/21/20 0349 07/22/20 0500  Weight: (!) 145 kg (!) 144.6 kg (!) 144.3 kg    REVIEW OF SYSTEMS  PATIENT IS UNABLE TO PROVIDE COMPLETE REVIEW OF SYSTEM S DUE TO SEVERE CRITICAL ILLNESS AND ENCEPHALOPATHY  PHYSICAL EXAMINATION: Gen:      No acute distress, chronically ill-appearing HEENT:  EOMI, sclera anicteric Neck:     No masses; no thyromegaly, trach Lungs:    Clear to auscultation bilaterally; normal respiratory effort CV:         Regular rate and rhythm; no murmurs Abd:      + bowel sounds; soft, non-tender; no palpable masses, no distension Ext:    No edema; adequate peripheral perfusion Skin:      Warm and dry; no rash Neuro: Sedated  Labs/imaging personally reviewed Metabolic panel stable, hemoglobin slightly reduced to  8.8   Assessment & Plan:    Acute Hypoxic Respiratory failure secondary to acute encephalopathy due to Alcohol withdrawal s/p trach +Acute Hypoxic Respiratory failure secondary to acute encephalopathy due to Alcohol withdrawal +ELIZABETHKINGIA SPECIES in [place] started ABX Pneumonia, failure to wean from vent and severe agitation from ETOH abuse with signs and sympoms of brain damage at micro cellular level   Ventilator Dependent Respiratory Failure secondary to Acute Encephalopathy due to ETOH withdrawal and Aspiration Pneumonia Continue vent support Wean PEEP/FiO2 Intermittent chest x-ray Bronchodilators as needed   Aspiration Pneumonia>>treated with Unasyn Ventilator Associated Pneumonia (12/6: Tracheal aspirate>>ELIZABETHKINGIA MENINGOSEPTICA)>>treated with Minocycline Appreciate ID consult Started on ciprofloxacin  Acute Toxic Metabolic Encephalopathy -Provide supportive care Off Dilaudid drip.  Wean down propofol as tolerated. -Continue scheduled oxycodone and Valium, fentanyl patch, Seroquel increased to 100 mg twice daily -Psych following, appreciate input CT head with no acute abnormality.   Anemia without s/sx of bleeding Monitor labs, transfuse for hemoglobin less than seven  Hyperglycemia SSI   GI -GI PROPHYLAXIS as indicated   NUTRITIONAL STATUS/DIET -Dietician following, TF's as tolerated -Constipation protocol as indicated -General surgery consulted for PEG tube placement, tentative plan for PEG on 12/18   BEST PRACTICES DISPOSITION: ICU GOALS OF CARE: Full Code VTE PROPHYLAXIS: Lovenox SUP: Protonix UPDATES: Updated Mom on 12/19  The patient is critically ill with multiple organ system failure and requires high complexity decision making for assessment and support, frequent evaluation and titration of therapies, advanced monitoring, review of radiographic studies and interpretation of complex data.   Critical Care Time devoted to patient care  services, exclusive of separately billable procedures, described in this note is 35 minutes.    1/20, M.D.  Pulmonary & Critical Care Medicine  [facility]

## 2020-07-24 NOTE — TOC Progression Note (Signed)
Transition of Care [facility]) - Progression Note    Patient Details  Name: Nicholas Valdez MRN: [id number] Date of Birth: 03-17-1981  Transition of Care [facility] CM/SW Contact  Marina Goodell Phone Number: [phone] 07/24/2020, 3:32 PM  Clinical Narrative:     Patient is still very agitated and unable to follow commands.  Patient remains sedated and ventilated.  PEG tube placed. Patient on feeds. Small pressure injury on heel.  Expected Discharge Plan: Home/Self Care Barriers to Discharge: Continued Medical Work up,Active Substance Use - Placement  Expected Discharge Plan and Services Expected Discharge Plan: Home/Self Care In-house Referral: Clinical Social Work   Post Acute Care Choice: IP Rehab Living arrangements for the past 2 months: Single Family Home                                       Social Determinants of Health (SDOH) Interventions    Readmission Risk Interventions Readmission Risk Prevention Plan 12/16/2018  Transportation Screening Complete  PCP or Specialist Appt within 5-7 Days Complete  Home Care Screening Complete  Medication Review (RN CM) Complete  Some recent data might be hidden

## 2020-07-25 DIAGNOSIS — F10231 Alcohol dependence with withdrawal delirium: Secondary | ICD-10-CM | POA: Diagnosis not present

## 2020-07-25 DIAGNOSIS — L8990 Pressure ulcer of unspecified site, unspecified stage: Secondary | ICD-10-CM | POA: Insufficient documentation

## 2020-07-25 DIAGNOSIS — R509 Fever, unspecified: Secondary | ICD-10-CM | POA: Diagnosis not present

## 2020-07-25 DIAGNOSIS — A488 Other specified bacterial diseases: Secondary | ICD-10-CM | POA: Diagnosis not present

## 2020-07-25 DIAGNOSIS — J189 Pneumonia, unspecified organism: Secondary | ICD-10-CM | POA: Diagnosis not present

## 2020-07-25 LAB — CBC
HCT: 27.3 % — ABNORMAL LOW (ref 39.0–52.0)
Hemoglobin: 9.3 g/dL — ABNORMAL LOW (ref 13.0–17.0)
MCH: 33.7 pg (ref 26.0–34.0)
MCHC: 34.1 g/dL (ref 30.0–36.0)
MCV: 98.9 fL (ref 80.0–100.0)
Platelets: 342 10*3/uL (ref 150–400)
RBC: 2.76 MIL/uL — ABNORMAL LOW (ref 4.22–5.81)
RDW: 14.6 % (ref 11.5–15.5)
WBC: 12.5 10*3/uL — ABNORMAL HIGH (ref 4.0–10.5)
nRBC: 0 % (ref 0.0–0.2)

## 2020-07-25 LAB — GLUCOSE, CAPILLARY
Glucose-Capillary: 113 mg/dL — ABNORMAL HIGH (ref 70–99)
Glucose-Capillary: 111 mg/dL — ABNORMAL HIGH (ref 70–99)
Glucose-Capillary: 95 mg/dL (ref 70–99)
Glucose-Capillary: 86 mg/dL (ref 70–99)
Glucose-Capillary: 106 mg/dL — ABNORMAL HIGH (ref 70–99)
Glucose-Capillary: 120 mg/dL — ABNORMAL HIGH (ref 70–99)

## 2020-07-25 LAB — BASIC METABOLIC PANEL
Anion gap: 11 (ref 5–15)
BUN: 14 mg/dL (ref 6–20)
CO2: 27 mmol/L (ref 22–32)
Calcium: 9.3 mg/dL (ref 8.9–10.3)
Chloride: 101 mmol/L (ref 98–111)
Creatinine, Ser: 0.76 mg/dL (ref 0.61–1.24)
GFR, Estimated: >60 mL/min (ref >60)
Glucose, Bld: 115 mg/dL — ABNORMAL HIGH (ref 70–99)
Potassium: 3.7 mmol/L (ref 3.5–5.1)
Sodium: 139 mmol/L (ref 135–145)

## 2020-07-25 LAB — MAGNESIUM: Magnesium: 2.1 mg/dL (ref 1.7–2.4)

## 2020-07-25 LAB — PHOSPHORUS: Phosphorus: 5.2 mg/dL — ABNORMAL HIGH (ref 2.5–4.6)

## 2020-07-25 MED ORDER — PROSOURCE TF PO LIQD
90.0000 mL | Freq: Every day | ORAL | Status: DC
  Administered 2020-07-25 – 2020-07-28 (×14): 90 mL
  Filled 2020-07-25: qty 90

## 2020-07-25 MED ORDER — PIVOT 1.5 CAL PO LIQD
1000.0000 mL | ORAL | Status: DC
  Administered 2020-07-25 – 2020-07-27 (×3): 1000 mL
  Filled 2020-07-25: qty 1000

## 2020-07-25 MED ORDER — HALOPERIDOL LACTATE 5 MG/ML IJ SOLN
5.0000 mg | Freq: Once | INTRAMUSCULAR | Status: AC
  Administered 2020-07-25: 5 mg via INTRAVENOUS

## 2020-07-25 MED ORDER — HALOPERIDOL LACTATE 5 MG/ML IJ SOLN
INTRAMUSCULAR | Status: AC
  Filled 2020-07-25: qty 1

## 2020-07-25 MED ORDER — VALPROIC ACID 250 MG/5ML PO SOLN
500.0000 mg | Freq: Two times a day (BID) | ORAL | Status: DC
  Administered 2020-07-25 – 2020-07-30 (×10): 500 mg
  Filled 2020-07-25 (×13): qty 10

## 2020-07-25 MED ORDER — OXYCODONE HCL 5 MG PO TABS
5.0000 mg | ORAL_TABLET | ORAL | Status: DC
  Administered 2020-07-25 – 2020-07-30 (×28): 5 mg
  Filled 2020-07-25 (×29): qty 1

## 2020-07-25 NOTE — Progress Notes (Signed)
Nutrition Follow-up  DOCUMENTATION CODES:   Morbid obesity  INTERVENTION:   Initiate Pivot 1.5 @ 15 mL/hr + PROSource TF 90 mL 5 times daily per tube.   Free water flushes 30m q4 hours to maintain tube patency   Regimen provides 940 kcal, 144 grams of protein, 453 mL H2O daily.  Once pt is able to keep head above 30 degrees, recommend:  Initiate Vital 1.5 Cal at 70 mL/hr + PROSource TF 45 mL 3 times daily per tube.   Free water flushes 344mq4 hours to maintain tube patency   Goal regimen provides 2640 kcal, 147 grams of protein, 1464 mL H2O daily.  NUTRITION DIAGNOSIS:   Inadequate oral intake related to inability to eat as evidenced by NPO status. Ongoing.  GOAL:   Patient will meet greater than or equal to 90% of their needs -not met   MONITOR:   Vent status,Labs,Weight trends,TF tolerance,I & O's  ASSESSMENT:   [age]ear old male with PMHx of EtOH abuse, polysubstance abuse, hepatitis C admitted with AMS, acute encephalopathy, EtOH withdrawal, suspected aspiration PNA, AKI, sepsis.  Pt s/p tracheostomy tube placement 12/6 Pt s/p surgical placed G-tube 12/18  Pt remains sedated and ventilated; pt agitated at times. Pt restarted on tube feeds yesterday; tube feeds were ultimately stopped as pt was unable to remain in a sitting position with his head above 30 degrees and RN reports concern over aspiration. Will plan to start trickle feeds today as using the GI system for enteral feeding helps prevent gut mucosal atrophy, reduces septic complications by decreasing bacterial translocation, stimulates gut motility therefore reducing the risk of ileus, and enhances the intestinal immune system. Will add Pro-Source to help pt meet his estimated needs until he is able to keep his head elevated above 30 degrees.   Medications reviewed and include: colace, lovenox, folic acid, melatonin, insulin, MVI, oxycodone, protonix, miralax, thiamine, B12, ciprofloxacin  Labs reviewed:  K 3.7 wnl, P 5.2(H), Mg 2.1 wnl Wbc- 12.5(H), Hgb 9.3(L), Hct 27.3(L)  Patient is currently intubated on ventilator support MV: 12.1 L/min Temp (24hrs), Avg:99.8 F (37.7 C), Min:98.9 F (37.2 C), Max:100.6 F (38.1 C)  Propofol: none  MAP- >6551m  UOP- [id number] Order:   Diet Order            Diet NPO time specified  Diet effective midnight                EDUCATION NEEDS:   No education needs have been identified at this time  Skin:  closed incision to neck, L heel wound   Last BM:  12/17- type 7  Height:   Ht Readings from Last 1 Encounters:  07/19/20 5' 10"  (1.778 m)   Weight:   Wt Readings from Last 1 Encounters:  07/25/20 (!) 141.5 kg   Ideal Body Weight:  75.5 kg  BMI:  Body mass index is 44.76 kg/m.  Estimated Nutritional Needs:   Kcal:  2653  Protein:  145-155 grams  Fluid:  >/= 2.2 L/day  CaseKoleen Distance RD, LDN Please refer to [facility] RD and/or RD on-call/weekend/after hours pager

## 2020-07-25 NOTE — Progress Notes (Signed)
ID Pt a bit more alert  Trying to communicate with the nurse by writing Still restless and moving all over in bed As per nurse resp secretions better Only getting ativan PRN    11/15: COVID-19 PCR>> negative 11/15: Influenza PCR>> negative 11/16: MRSA PCR>> negative 11/16: Tracheal aspirate>>negative 11/16: HIV screen>>nonreactive 11/18:Tracheal aspirate>> no growth 11/21:Tracheal aspirate>rare normal respiratory flora 12/1:Tracheal aspirate>>Normal respiratory flora 12/6: Tracheal aspirate>>ELIZABETHKINGIA MENINGOSEPTICA 12/6: MRSA PCR>>negative 12/14: Tracheal aspirate>>Elizabethia  Antimicrobials Unasyn 11/17>>11/23; 11/29>>12/3 Fluconazole 12/1>>12/8 Vancomycin 11/21>.11/23 Minocycline 12/8>>12/14 cipro 12/17>> Patient Vitals for the past 24 hrs:  BP Temp Temp src Pulse Resp SpO2 Weight  07/25/20 2030 -- 100 F (37.8 C) Axillary -- -- -- --  07/25/20 2000 109/69 -- -- (!) 115 17 100 % --  07/25/20 1900 129/70 -- -- -- 17 -- --  07/25/20 1800 (!) 153/83 -- -- (!) 127 (!) 27 98 % --  07/25/20 1700 116/74 -- -- 99 (!) 23 98 % --  07/25/20 1600 133/89 99.2 F (37.3 C) Oral (!) 114 (!) 31 100 % --  07/25/20 1500 133/90 -- -- (!) 101 19 100 % --  07/25/20 1400 (!) 128/92 -- -- 89 19 100 % --  07/25/20 1300 132/83 -- -- (!) 103 (!) 23 100 % --  07/25/20 1200 132/90 98.4 F (36.9 C) Oral 93 14 100 % --  07/25/20 1100 112/67 -- -- 88 15 100 % --  07/25/20 1000 (!) 129/93 -- -- (!) 121 20 100 % --  07/25/20 0900 120/73 -- -- 78 (!) 22 100 % --  07/25/20 0800 125/78 -- -- 84 (!) 22 100 % --  07/25/20 0750 -- 99.1 F (37.3 C) Oral 92 17 100 % --  07/25/20 0700 114/74 -- -- (!) 103 17 99 % --  07/25/20 0600 118/85 -- -- 94 15 100 % --  07/25/20 0500 112/65 -- -- (!) 110 -- 100 % (!) 141.5 kg  07/25/20 0400 121/77 99.8 F (37.7 C) Axillary (!) 104 14 100 % --  07/25/20 0300 (!) 106/59 -- -- 100 -- 97 % --  07/25/20 0200 109/64 -- -- (!) 101 -- 98 % --   07/25/20 0100 96/61 -- -- 100 -- 96 % --  07/25/20 0000 (!) 95/50 -- -- (!) 101 (!) 22 94 % --  07/24/20 2330 -- (!) 100.6 F (38.1 C) Axillary -- -- -- --  07/24/20 2300 (!) 101/55 -- -- (!) 112 (!) 31 98 % --  07/24/20 2200 (!) 128/106 -- -- (!) 115 16 99 % --  07/24/20 2100 130/75 -- -- (!) 109 17 99 % --   Awake, trying to talk softly Still confused and restless Left arm in restraint Tracheostomy Foley Chest b/l air entry Tachycardia Moving all limbs  Labs CBC Latest Ref Rng & Units 07/25/2020 07/24/2020 07/22/2020  WBC 4.0 - 10.5 K/uL 12.5(H) 11.4(H) 9.0  Hemoglobin 13.0 - 17.0 g/dL 4.0(G) 8.6(P) 6.1(P)  Hematocrit 39.0 - 52.0 % 27.3(L) 25.0(L) 26.6(L)  Platelets 150 - 400 K/uL 342 300 323   CMP Latest Ref Rng & Units 07/25/2020 07/24/2020 07/23/2020  Glucose 70 - 99 mg/dL 509(T) 267(T) 245(Y)  BUN 6 - 20 mg/dL 14 16 18   Creatinine 0.61 - 1.24 mg/dL 0.99 8.33  Sodium 135 - 145 mmol/L 139 139 138  Potassium 3.5 - 5.1 mmol/L 3.7 3.3(L) 3.7  Chloride 98 - 111 mmol/L 101 101 100  CO2 22 - 32 mmol/L 27 29 26   Calcium 8.9 -  10.3 mg/dL 9.3 8.2(C) 9.0  Total Protein 6.5 - 8.1 g/dL - - -  Total Bilirubin 0.3 - 1.2 mg/dL - - -  Alkaline Phos 38 - 126 U/L - - -  AST 15 - 41 U/L - - -  ALT 0 - 44 U/L - - -    Impression/recommendation 39 year old male with EtOH abuse, polysubstance use presented on 06/19/2020 to the ED with alcohol intoxication and altered mental status.  He was intubated on 1116 admitted to ICU.  He underwent tracheostomy on 07/10/2020.  Intermittent low-grade fever Elizabeth kinky have bacteria in tracheal culture.  This may be colonizing the trachea but because of concern for nosocomial pneumonia especially with bilateral upper lobe infiltrates and intermittent fever is being treated with ciprofloxacin since 07/21/2020 Also aspiration pneumonitis is a concern. If his fever increases or white count goes up then would recommend changing the Cipro to  Unasyn  Delirium, agitation.   looks like there is some improvement today  Hepatitis C infection with high viral load.  Will need treatment as outpatient.  Anemia of chronic illness..  Macrocytosis due to alcohol  Discussed the management with the nurse ID will follow him remotely tomorrow.  Call if needed [phone]

## 2020-07-25 NOTE — Progress Notes (Signed)
NAME:  Nicholas Valdez, MRN:  [id number], DOB:  [birth year], LOS: 36 ADMISSION DATE:  06/19/2020,   Brief History   39 year old male with past medical history significant for EtOH and polysubstance abuse admitted with acute metabolic encephalopathy secondary to alcohol withdrawal and acute hypoxic respiratory failure due to encephalopathy and aspiration pneumonia.  Status post tracheostomy.   12/21- patient continues to have withdrawal symptoms with aggitation. We have added depakene and dcd haldol.  RD reviewed nourishment and adding prostat. Plan to transition to BIPAP with trache when able.    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 Despite decreasing versed to 7 and completely stopping Propofol patient is only partially arousable. Will continue to wean sedation to RASS-0 with plan for SBT. 06/24/20- patient is being weaned off sedation as possible.  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  12/1- patient became severely aggitated, tachypneic, tachycardic and with heavy endotracheal secretions. He is febrile 12/2-Patient with extremely high tolerance to sedation have been unable to bring down dosing. Reviewed care plan with mother of patient Nicholas Braun - plan for trache - ENT consult placed. 07/07/2020:Tracheostomy planned for 12/6, remains encephalopathic, does not follow commands, agitation when sedation is  lightened. 07/08/2020:Moves all fours, still on vent, less agitated 12/6TRACH placed by ENT 12/8 Nicholas Valdez in [place] started ABX 12/17: Repeat Tracheal aspirate w/ Nicholas Valdez, ID Consulted, General Surgery consulted for PEG 12/18: PEG tube placement  12/20- patient with agitation, multiple times requiring re-positioning. Nicholas Valdez is reproducing in tracheobronchial tree resistant to most antimicrobials.   Consults:  PCCM Psychiatry IR Infectious disease General surgery  Procedures:  11/16: ETT 11/16: CVC 3L>>will need PICC line 12/6: Tracheostomy placed by ENT 12/18 PEG tube  Significant Diagnostic Tests:  11/15: CT Head>> negative 11/16: US Abdomen>>. Mild hepatic steatosis 11/17: Echocardiogram>> LVEF 65-70%, no wall motion abnormalities, no other abnormalities noted 12/16: CT Abdomen/Pelvis>>Bibasilar pulmonary infiltrates, likely infectious or inflammatory and suggestive of atypical infection or aspiration. Mild hepatosplenomegaly. Nasogastric tube extends into the mid to distal body of the stomach. In this region, there is an appropriate tract for potential percutaneous access of the stomach. 12/17: CT head>>1. No intracranial abnormality. 2. Bilateral mastoid effusions and fluid in the middle ears. 3. Mucosal inflammatory changes of the paranasal sinuses. 12/17: CT chest>> multifocal airspace disease, tracheostomy in stable position.  Micro Data:  11/15: COVID-19 PCR>> negative 11/15: Influenza PCR>> negative 11/16: MRSA PCR>> negative 11/16: Tracheal aspirate>>negative 11/16: HIV screen>>nonreactive 11/18: Sputum>> no growth 11/21: Sputum>>rare normal respiratory flora 12/1: Sputum >> Normal respiratory flora 12/6: Tracheal aspirate>>Nicholas MENINGOSEPTICA  12/6: MRSA PCR>>negative 12/14: Tracheal aspirate>> FEW Nicholas MIRICOLA (susceptiblities to follow) 07/22/20  FEW Nicholas Valdez    Report Status 07/22/2020 FINAL   Organism ID, Bacteria Nicholas Valdez   Resulting Agency [facility]      Susceptibility   Nicholas Valdez    MIC    CEFAZOLIN >=64 RESIST... Resistant    CEFTAZIDIME >=64 RESIST... Resistant    CIPROFLOXACIN  0.5 SENSITIVE  Sensitive    GENTAMICIN >=16 RESIST... Resistant    IMIPENEM >=16 RESIST... Resistant    PIP/TAZO >=128 RESIS... Resistant    TRIMETH/SULFA 40 SENSITIVE  Sensitive            Antimicrobials:  Unasyn 11/17>>11/23; 11/29>>12/3 Vancomycin 11/21 x1 dose Fluconazole 12/1>>12/8 Minocycline 12/8>>12/14  Ciprofloxacin 12/17 >>   CC  follow up resp failure  INTERVAL HISTORY No acute events.  Continue sedation and propofol Has significant agitation when sedation is being  Objective   Blood pressure 125/78, pulse 84, temperature 99.1 F (37.3 C), temperature source Oral, resp. rate (!) 22, height 5\' 10"  (1.778 m), weight (!) 141.5 kg, SpO2 100 %.    Vent Mode: PRVC FiO2 (%):  [35 %] 35 % Set Rate:  [22 bmp] 22 bmp Vt Set:  [550 mL] 550 mL PEEP:  [5 cmH20] 5 cmH20 Plateau Pressure:  [18 cmH20] 18 cmH20   Intake/Output Summary (Last 24 hours) at 07/25/2020 0955 Last data filed at 07/25/2020 0750 Gross per 24 hour  Intake 460.75 ml  Output 5950 ml  Net -5489.25 ml   Filed Weights   07/21/20 0349 07/22/20 0500 07/25/20 0500  Weight: (!) 144.6 kg (!) 144.3 kg (!) 141.5 kg    REVIEW OF SYSTEMS  PATIENT IS UNABLE TO PROVIDE COMPLETE REVIEW OF SYSTEM S DUE TO SEVERE CRITICAL ILLNESS AND ENCEPHALOPATHY  PHYSICAL EXAMINATION: Gen:      No acute distress, chronically ill-appearing HEENT:  EOMI, sclera anicteric Neck:     No masses; no thyromegaly, trach Lungs:    Clear to auscultation bilaterally; normal respiratory effort CV:         Regular rate and rhythm; no murmurs Abd:      + bowel sounds; soft, non-tender; no palpable masses, no distension Ext:    No edema; adequate  peripheral perfusion Skin:      Warm and dry; no rash Neuro: Sedated     Assessment & Plan:    Acute Hypoxic Respiratory failure secondary to acute encephalopathy due to Alcohol withdrawal s/p trach +Acute Hypoxic Respiratory failure secondary to acute encephalopathy due to Alcohol withdrawal +Nicholas Valdez in [place] started ABX Pneumonia, failure to wean from vent and severe agitation from ETOH abuse with signs and sympoms of brain damage at micro cellular level   Ventilator Dependent Respiratory Failure secondary to Acute Encephalopathy due to ETOH withdrawal and Aspiration Pneumonia Continue vent support Wean PEEP/FiO2 Intermittent chest x-ray Bronchodilators as needed   Aspiration Pneumonia>>treated with Unasyn Ventilator Associated Pneumonia (12/6: Tracheal aspirate>>Nicholas MENINGOSEPTICA)>>treated with Minocycline Appreciate ID consult Started on ciprofloxacin  Acute Toxic Metabolic Encephalopathy -Provide supportive care Off Dilaudid drip.  Wean down propofol as tolerated. -Continue scheduled oxycodone and Valium, fentanyl patch, Seroquel increased to 100 mg twice daily -Psych following, appreciate input CT head with no acute abnormality.   Anemia without s/sx of bleeding Monitor labs, transfuse for hemoglobin less than seven  Hyperglycemia SSI   GI -GI PROPHYLAXIS as indicated   NUTRITIONAL STATUS/DIET -Dietician following, TF's as tolerated -Constipation protocol as indicated -General surgery consulted for PEG tube placement, tentative plan for PEG on 12/18   BEST PRACTICES DISPOSITION: ICU GOALS OF CARE: Full Code VTE PROPHYLAXIS: Lovenox SUP: Protonix UPDATES: Updated Mom on 12/19  The patient is critically ill with multiple organ system failure and requires high complexity decision making for assessment and support, frequent evaluation and titration of therapies, advanced monitoring, review of radiographic studies and  interpretation of complex data.  Critical Care Time devoted to patient care services, exclusive of separately billable procedures, described in this note is 35 minutes.    Vida Rigger, M.D.  Pulmonary & Critical Care Medicine  [facility]

## 2020-07-25 NOTE — Progress Notes (Addendum)
Assisted tele visit to patient with Reece Agar.  Vena Austria, RN

## 2020-07-25 NOTE — Progress Notes (Signed)
PHARMACY CONSULT NOTE  Pharmacy Consult for Electrolyte Monitoring and Replacement   Recent Labs: Potassium (mmol/L)  Date Value  07/25/2020 3.7  02/12/2013 3.9   Magnesium (mg/dL)  Date Value  64/33/2951 2.1   Calcium (mg/dL)  Date Value  88/41/6606 9.3   Calcium, Total (mg/dL)  Date Value  30/16/0109 8.7   Albumin (g/dL)  Date Value  32/35/5732 3.2 (L)  02/12/2013 3.6   Phosphorus (mg/dL)  Date Value  20/25/4270 5.2 (H)   Sodium (mmol/L)  Date Value  07/25/2020 139  02/12/2013 139   Assessment: 39 year old male here with encephalopathy s/t alcohol withdrawal. Patient has been intubated and sedated since 11/16. 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 Pharmacy to manage electrolytes.    Goal of Therapy:  Electrolytes WNL  Plan:   No replacement indicated today.  Will continue to follow electrolytes   Pricilla Riffle, PharmD 07/25/2020 2:01 PM

## 2020-07-26 ENCOUNTER — Inpatient Hospital Stay: Payer: Medicaid Other

## 2020-07-26 LAB — GLUCOSE, CAPILLARY
Glucose-Capillary: 111 mg/dL — ABNORMAL HIGH (ref 70–99)
Glucose-Capillary: 101 mg/dL — ABNORMAL HIGH (ref 70–99)
Glucose-Capillary: 95 mg/dL (ref 70–99)
Glucose-Capillary: 113 mg/dL — ABNORMAL HIGH (ref 70–99)
Glucose-Capillary: 108 mg/dL — ABNORMAL HIGH (ref 70–99)
Glucose-Capillary: 107 mg/dL — ABNORMAL HIGH (ref 70–99)
Glucose-Capillary: 105 mg/dL — ABNORMAL HIGH (ref 70–99)

## 2020-07-26 LAB — CBC
HCT: 27.4 % — ABNORMAL LOW (ref 39.0–52.0)
Hemoglobin: 9.6 g/dL — ABNORMAL LOW (ref 13.0–17.0)
MCH: 34 pg (ref 26.0–34.0)
MCHC: 35 g/dL (ref 30.0–36.0)
MCV: 97.2 fL (ref 80.0–100.0)
Platelets: 315 10*3/uL (ref 150–400)
RBC: 2.82 MIL/uL — ABNORMAL LOW (ref 4.22–5.81)
RDW: 14.4 % (ref 11.5–15.5)
WBC: 11 10*3/uL — ABNORMAL HIGH (ref 4.0–10.5)
nRBC: 0 % (ref 0.0–0.2)

## 2020-07-26 LAB — BASIC METABOLIC PANEL
Anion gap: 9 (ref 5–15)
BUN: 14 mg/dL (ref 6–20)
CO2: 27 mmol/L (ref 22–32)
Calcium: 9.2 mg/dL (ref 8.9–10.3)
Chloride: 99 mmol/L (ref 98–111)
Creatinine, Ser: 0.8 mg/dL (ref 0.61–1.24)
GFR, Estimated: >60 mL/min (ref >60)
Glucose, Bld: 119 mg/dL — ABNORMAL HIGH (ref 70–99)
Potassium: 3.5 mmol/L (ref 3.5–5.1)
Sodium: 135 mmol/L (ref 135–145)

## 2020-07-26 LAB — MAGNESIUM: Magnesium: 2 mg/dL (ref 1.7–2.4)

## 2020-07-26 LAB — PHOSPHORUS: Phosphorus: 4.3 mg/dL (ref 2.5–4.6)

## 2020-07-26 MED ORDER — LORAZEPAM 2 MG/ML IJ SOLN: 2.0000 mg | Freq: Once | INTRAMUSCULAR | Status: DC

## 2020-07-26 NOTE — Progress Notes (Signed)
Remains on trach collar. OK to remain on trach collar as long as tolerates well per NP. Vent on sb

## 2020-07-26 NOTE — Progress Notes (Signed)
PHARMACY CONSULT NOTE  Pharmacy Consult for Electrolyte Monitoring and Replacement   Recent Labs: Potassium (mmol/L)  Date Value  07/26/2020 3.5  02/12/2013 3.9   Magnesium (mg/dL)  Date Value  49/44/9675 2.0   Calcium (mg/dL)  Date Value  91/63/8466 9.2   Calcium, Total (mg/dL)  Date Value  59/93/5701 8.7   Albumin (g/dL)  Date Value  77/93/9030 3.2 (L)  02/12/2013 3.6   Phosphorus (mg/dL)  Date Value  04/27/3006 4.3   Sodium (mmol/L)  Date Value  07/26/2020 135  02/12/2013 139   Assessment: 39 year old male here with encephalopathy s/t alcohol withdrawal. Patient has been intubated and sedated since 11/16. 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 Pharmacy to manage electrolytes.    Goal of Therapy:  Electrolytes WNL  Plan:   No replacement indicated today.  Will continue to follow electrolytes   Pricilla Riffle, PharmD 07/26/2020 12:35 PM

## 2020-07-26 NOTE — Progress Notes (Signed)
NAME:  Nicholas Valdez, MRN:  [id number], DOB:  1980/12/03, LOS: 37 ADMISSION DATE:  06/19/2020,   Brief History   39 year old male with past medical history significant for EtOH and polysubstance abuse admitted with acute metabolic encephalopathy secondary to alcohol withdrawal and acute hypoxic respiratory failure due to encephalopathy and aspiration pneumonia.  Status post tracheostomy.   12/21-22/2021 patient continues to have withdrawal symptoms with aggitation. We have added depakene and dcd haldol.  RD reviewed nourishment and adding prostat. Plan to transition to BIPAP with trache when able.    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 Despite decreasing versed to 7 and completely stopping Propofol patient is only partially arousable. Will continue to wean sedation to RASS-0 with plan for SBT. 06/24/20- patient is being weaned off sedation as possible.  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  12/1- patient became severely aggitated, tachypneic, tachycardic and with heavy endotracheal secretions. He is febrile 12/2-Patient with extremely high tolerance to sedation have been unable to bring down dosing. Reviewed care plan with mother of patient Clydie Braun - plan for trache - ENT consult placed. 07/07/2020:Tracheostomy planned for 12/6, remains encephalopathic, does not follow commands, agitation when  sedation is lightened. 07/08/2020:Moves all fours, still on vent, less agitated 12/6TRACH placed by ENT 12/8 ELIZABETHKINGIA SPECIES in TA started ABX 12/17: Repeat Tracheal aspirate w/ ELIZABETHKINGIA SPECIES, ID Consulted, General Surgery consulted for PEG 12/18: PEG tube placement  12/20- patient with agitation, multiple times requiring re-positioning. 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   Consults:  PCCM Psychiatry IR Infectious disease General surgery  Procedures:  11/16: ETT 11/16: CVC 3L>>will need PICC line 12/6: Tracheostomy placed by ENT 12/18 PEG tube  Significant Diagnostic Tests:  11/15: CT Head>> negative 11/16: US Abdomen>>. Mild hepatic steatosis 11/17: Echocardiogram>> LVEF 65-70%, no wall motion abnormalities, no other abnormalities noted 12/16: CT Abdomen/Pelvis>>Bibasilar pulmonary infiltrates, likely infectious or inflammatory and suggestive of atypical infection or aspiration. Mild hepatosplenomegaly. Nasogastric tube extends into the mid to distal body of the stomach. In this region, there is an appropriate tract for potential percutaneous access of the stomach. 12/17: CT head>>1. No intracranial abnormality. 2. Bilateral mastoid effusions and fluid in the middle ears. 3. Mucosal inflammatory changes of the paranasal sinuses. 12/17: CT chest>> multifocal airspace disease, tracheostomy in stable position.  Micro Data:  11/15: COVID-19 PCR>> negative 11/15: Influenza PCR>> negative 11/16: MRSA PCR>> negative 11/16: Tracheal aspirate>>negative 11/16: HIV screen>>nonreactive 11/18: Sputum>> no growth 11/21: Sputum>>rare normal respiratory flora 12/1: Sputum >> Normal respiratory flora 12/6: Tracheal aspirate>>ELIZABETHKINGIA MENINGOSEPTICA  12/6: MRSA PCR>>negative 12/14: Tracheal aspirate>> FEW ELIZABETHKINGIA MIRICOLA (susceptiblities to follow) 07/22/20  FEW FLAVOBACTERIUM  MENINGOSEPTICUM   Report Status 07/22/2020 FINAL   Organism ID, Bacteria FLAVOBACTERIUM MENINGOSEPTICUM   Resulting Agency [facility]      Susceptibility   Flavobacterium meningosepticum    MIC    CEFAZOLIN >=64 RESIST... Resistant    CEFTAZIDIME >=64 RESIST... Resistant    CIPROFLOXACIN  0.5 SENSITIVE  Sensitive    GENTAMICIN >=16 RESIST... Resistant    IMIPENEM >=16 RESIST... Resistant    PIP/TAZO >=128 RESIS... Resistant    TRIMETH/SULFA 40 SENSITIVE  Sensitive            Antimicrobials:  Unasyn 11/17>>11/23; 11/29>>12/3 Vancomycin 11/21 x1 dose Fluconazole 12/1>>12/8 Minocycline 12/8>>12/14  Ciprofloxacin 12/17 >>   CC  follow up resp failure  INTERVAL HISTORY No acute events.  Continue sedation and propofol Has significant agitation when sedation is being  Objective   Blood pressure (!) 132/91, pulse 86, temperature (!) 100.5 F (38.1 C), temperature source Axillary, resp. rate 14, height 5\' 10"  (1.778 m), weight (!) 141.5 kg, SpO2 100 %.    Vent Mode: PRVC FiO2 (%):  [35 %] 35 % Set Rate:  [22 bmp] 22 bmp Vt Set:  [550 mL] 550 mL PEEP:  [5 cmH20] 5 cmH20   Intake/Output Summary (Last 24 hours) at 07/26/2020 1821 Last data filed at 07/26/2020 1754 Gross per 24 hour  Intake 1012.22 ml  Output 3250 ml  Net -2237.78 ml   Filed Weights   07/21/20 0349 07/22/20 0500 07/25/20 0500  Weight: (!) 144.6 kg (!) 144.3 kg (!) 141.5 kg    REVIEW OF SYSTEMS  PATIENT IS UNABLE TO PROVIDE COMPLETE REVIEW OF SYSTEM S DUE TO SEVERE CRITICAL ILLNESS AND ENCEPHALOPATHY  PHYSICAL EXAMINATION: Gen:      No acute distress, chronically ill-appearing HEENT:  EOMI, sclera anicteric Neck:     No masses; no thyromegaly, trach Lungs:    Clear to auscultation bilaterally; normal respiratory effort CV:         Regular rate and rhythm; no murmurs Abd:      + bowel sounds; soft, non-tender; no palpable masses, no distension Ext:    No edema; adequate peripheral  perfusion Skin:      Warm and dry; no rash Neuro: Sedated     Assessment & Plan:    Acute Hypoxic Respiratory failure secondary to acute encephalopathy due to Alcohol withdrawal s/p trach +Acute Hypoxic Respiratory failure secondary to acute encephalopathy due to Alcohol withdrawal +ELIZABETHKINGIA SPECIES in TA started ABX Pneumonia, failure to wean from vent and severe agitation from ETOH abuse with signs and sympoms of brain damage at micro cellular level   Ventilator Dependent Respiratory Failure secondary to Acute Encephalopathy due to ETOH withdrawal and Aspiration Pneumonia Continue vent support Wean PEEP/FiO2 Intermittent chest x-ray Bronchodilators as needed   Aspiration Pneumonia>>treated with Unasyn Ventilator Associated Pneumonia (12/6: Tracheal aspirate>>ELIZABETHKINGIA MENINGOSEPTICA)>>treated with Minocycline Appreciate ID consult Started on ciprofloxacin  Acute Toxic Metabolic Encephalopathy -Provide supportive care Off Dilaudid drip.  Wean down propofol as tolerated. -Continue scheduled oxycodone and Valium, fentanyl patch, Seroquel increased to 100 mg twice daily -Psych following, appreciate input CT head with no acute abnormality.   Anemia without s/sx of bleeding Monitor labs, transfuse for hemoglobin less than seven  Hyperglycemia SSI   GI -GI PROPHYLAXIS as indicated   NUTRITIONAL STATUS/DIET -Dietician following, TF's as tolerated -Constipation protocol as indicated -General surgery consulted for PEG tube placement, tentative plan for PEG on 12/18   BEST PRACTICES DISPOSITION: ICU GOALS OF CARE: Full Code VTE PROPHYLAXIS: Lovenox SUP: Protonix UPDATES: Updated Mom on 12/19  The patient is critically ill with multiple organ system failure and requires high complexity decision making for assessment and support, frequent evaluation and titration of therapies, advanced monitoring, review of radiographic studies and interpretation of  complex data.   Critical Care Time devoted to  patient care services, exclusive of separately billable procedures, described in this note is 35 minutes.    Vida Rigger, M.D.  Pulmonary & Critical Care Medicine  [facility]

## 2020-07-26 NOTE — Progress Notes (Addendum)
Pt very restless and agitated most of shift despite ativan and other phychiatric drugs. Pt does follow commands at times. Placed in 4 point restraints to protect pt safety as pt tries to repeatedly pull at lines and get out of bed. Has became combative with staff. Pt removed a PIV and now has 1 access. Multiple loose stools and flexiseal placed to protect skin integrity. Pressure injury documented on L heel and foam applied. Difficult to reposition pt due to agitation and restlessness. Pt was placed on tracheostomy collar early this AM and is tolerating well. HR 120's-130's when agitated but regular. Peg, trach, and foley intact.

## 2020-07-27 DIAGNOSIS — G9341 Metabolic encephalopathy: Secondary | ICD-10-CM | POA: Diagnosis not present

## 2020-07-27 DIAGNOSIS — A488 Other specified bacterial diseases: Secondary | ICD-10-CM | POA: Diagnosis not present

## 2020-07-27 DIAGNOSIS — F10231 Alcohol dependence with withdrawal delirium: Secondary | ICD-10-CM | POA: Diagnosis not present

## 2020-07-27 DIAGNOSIS — D638 Anemia in other chronic diseases classified elsewhere: Secondary | ICD-10-CM

## 2020-07-27 DIAGNOSIS — J189 Pneumonia, unspecified organism: Secondary | ICD-10-CM | POA: Diagnosis not present

## 2020-07-27 DIAGNOSIS — B1920 Unspecified viral hepatitis C without hepatic coma: Secondary | ICD-10-CM

## 2020-07-27 LAB — CBC
HCT: 32.2 % — ABNORMAL LOW (ref 39.0–52.0)
Hemoglobin: 10.8 g/dL — ABNORMAL LOW (ref 13.0–17.0)
MCH: 33.2 pg (ref 26.0–34.0)
MCHC: 33.5 g/dL (ref 30.0–36.0)
MCV: 99.1 fL (ref 80.0–100.0)
Platelets: 325 10*3/uL (ref 150–400)
RBC: 3.25 MIL/uL — ABNORMAL LOW (ref 4.22–5.81)
RDW: 14.6 % (ref 11.5–15.5)
WBC: 9.5 10*3/uL (ref 4.0–10.5)
nRBC: 0 % (ref 0.0–0.2)

## 2020-07-27 LAB — MAGNESIUM: Magnesium: 2.1 mg/dL (ref 1.7–2.4)

## 2020-07-27 LAB — GLUCOSE, CAPILLARY
Glucose-Capillary: 104 mg/dL — ABNORMAL HIGH (ref 70–99)
Glucose-Capillary: 102 mg/dL — ABNORMAL HIGH (ref 70–99)
Glucose-Capillary: 107 mg/dL — ABNORMAL HIGH (ref 70–99)
Glucose-Capillary: 117 mg/dL — ABNORMAL HIGH (ref 70–99)
Glucose-Capillary: 111 mg/dL — ABNORMAL HIGH (ref 70–99)
Glucose-Capillary: 124 mg/dL — ABNORMAL HIGH (ref 70–99)

## 2020-07-27 LAB — PHOSPHORUS: Phosphorus: 5 mg/dL — ABNORMAL HIGH (ref 2.5–4.6)

## 2020-07-27 LAB — TRIGLYCERIDES: Triglycerides: 164 mg/dL — ABNORMAL HIGH (ref <150)

## 2020-07-27 MED ORDER — CHLORHEXIDINE GLUCONATE CLOTH 2 % EX PADS
6.0000 | MEDICATED_PAD | Freq: Every day | CUTANEOUS | Status: DC
  Administered 2020-07-29 – 2020-07-31 (×3): 6 via TOPICAL

## 2020-07-27 MED ORDER — ORAL CARE MOUTH RINSE
15.0000 mL | Freq: Two times a day (BID) | OROMUCOSAL | Status: DC
  Administered 2020-07-28 – 2020-08-02 (×8): 15 mL via OROMUCOSAL

## 2020-07-27 MED ORDER — CHLORHEXIDINE GLUCONATE 0.12 % MT SOLN
15.0000 mL | Freq: Two times a day (BID) | OROMUCOSAL | Status: DC
  Administered 2020-07-28 – 2020-08-04 (×12): 15 mL via OROMUCOSAL
  Filled 2020-07-27 (×13): qty 15

## 2020-07-27 NOTE — Progress Notes (Signed)
Date of Admission:  06/19/2020     ID: Nicholas Valdez is a 39 y.o. male Principal Problem:   Alcohol withdrawal delirium ([name]) Active Problems:   Increased ammonia level   Acute metabolic encephalopathy   Elevated LFTs   Tobacco abuse   Elevated lactic acid level   Acute respiratory failure with hypoxia (HCC)   Pressure injury of skin   11/15: COVID-19 PCR>> negative 11/15: Influenza PCR>> negative 11/16: MRSA PCR>> negative 11/16: Tracheal aspirate>>negative 11/16: HIV screen>>nonreactive 11/18:Tracheal aspirate>> no growth 11/21:Tracheal aspirate>rare normal respiratory flora 12/1:Tracheal aspirate>>Normal respiratory flora 12/6: Tracheal aspirate>>ELIZABETHKINGIA MENINGOSEPTICA 12/6: MRSA PCR>>negative 12/14: Tracheal aspirate>>Elizabethia  Antimicrobials Unasyn 11/17>>11/23; 11/29>>12/3 Fluconazole 12/1>>12/8 Vancomycin 11/21>.11/23 Minocycline 12/8>>12/14  Subjective: Pt is more awake and conversing Not agitated  Medications:   chlorhexidine gluconate (MEDLINE KIT)  15 mL Mouth Rinse BID   Chlorhexidine Gluconate Cloth  6 each Topical Daily   docusate  100 mg Per Tube BID   enoxaparin (LOVENOX) injection  0.5 mg/kg Subcutaneous Q24H   feeding supplement (PIVOT 1.5 CAL)  1,000 mL Per Tube Q24H   feeding supplement (PROSource TF)  90 mL Per Tube 5 X Daily   fentaNYL  1 patch Transdermal V94I   folic acid  1 mg Per Tube Daily   gabapentin  600 mg Per Tube Q8H   insulin aspart  0-15 Units Subcutaneous Q4H   mouth rinse  15 mL Mouth Rinse 10 times per day   melatonin  5 mg Per Tube QHS   multivitamin with minerals  1 tablet Per Tube Daily   nystatin   Topical BID   oxyCODONE  5 mg Per Tube Q4H   pantoprazole sodium  40 mg Per Tube Daily   polyethylene glycol  17 g Per Tube Daily   polyvinyl alcohol  1 drop Both Eyes BID   QUEtiapine  100 mg Per Tube BID   scopolamine  1 patch Transdermal Q72H   thiamine  100 mg Per Tube  Daily   valproic acid  500 mg Per Tube BID   vitamin B-12  1,000 mcg Per Tube Daily   ziprasidone  20 mg Intramuscular BID    Objective: Vital signs in last 24 hours: Temp:  [98 F (36.7 C)-99.2 F (37.3 C)] 98 F (36.7 C) (12/23 2000) Pulse Rate:  [83-114] 109 (12/23 2000) Resp:  [10-58] 15 (12/23 2000) BP: (105-153)/(68-103) 125/82 (12/23 2000) SpO2:  [96 %-100 %] 100 % (12/23 2001) FiO2 (%):  [35 %] 35 % (12/23 2001) Weight:  [136.4 kg] 136.4 kg (12/23 0356)  PHYSICAL EXAM:  General: awake  Alert, cooperative, no distress, Head: Normocephalic, without obvious abnormality, atraumatic. Eyes: Conjunctivae clear, anicteric sclerae. Pupils are equal ENT Nares normal. No drainage or sinus tenderness.  Neck: Tracheostomy Lungs: b/l air entry. Heart: s1s2 Abdomen: Soft, gastrostomy  Bowel sounds normal. No masses foley Extremities: atraumatic, no cyanosis. No edema. No clubbing Skin: No rashes or lesions. Or bruising Neurologic: moves all limbs  Lab Results Recent Labs    07/25/20 0555 07/26/20 0403 07/27/20 0606  WBC 12.5* 11.0* 9.5  HGB 9.3* 9.6* 10.8*  HCT 27.3* 27.4* 32.2*  NA 139 135  --   K 3.7 3.5  --   CL 101 99  --   CO2 27 27  --   BUN 14 14  --   CREATININE 0.76 0.80  --    Liver Panel No results for input(s): PROT, ALBUMIN, AST, ALT, ALKPHOS, BILITOT, BILIDIR, IBILI in the  last 72 hours. Sedimentation Rate No results for input(s): ESRSEDRATE in the last 72 hours. C-Reactive Protein No results for input(s): CRP in the last 72 hours.  Microbiology:  Studies/Results: DG Chest Port 1 View  Result Date: 07/26/2020 CLINICAL DATA:  39 year old male with respiratory failure. EXAM: PORTABLE CHEST 1 VIEW COMPARISON:  Chest radiograph dated 07/23/2020. FINDINGS: Tracheostomy above the carina. Right perihilar linear atelectasis. Developing infiltrate is not excluded. No pleural effusion pneumothorax. Stable cardiac silhouette. Atherosclerotic calcification  of the aorta. IMPRESSION: Right perihilar linear atelectasis. Developing infiltrate is not excluded. Electronically Signed   By: Anner Crete M.D.   On: 07/26/2020 21:12     Assessment/Plan: 39 year old male with EtOH abuse, polysubstance use presented on 06/19/2020 with alcohol intoxication and altered mental status.  He was intubated on 06/20/2020 and admitted to ICU.  He underwent tracheostomy on 07/10/2020. Intermittent low-grade fever: Resolved ElizabethKingia in tracheal culture.  This may be colonizing the trachea but because of concern for nosocomial pneumonia especially with l bilateral infiltrates and fever he was started on ciprofloxacin on 07/21/2020.  He completed 7 days of treatment it has been discontinued today.  Delirium agitation: Much improved.  Patient is conversing today.  Hepatitis C infection with high viral load.  Will need treatment as outpatient.  Anemia of chronic illness.  ID will sign off.  Call if needed.

## 2020-07-27 NOTE — Evaluation (Signed)
[facility] - Evaluation Patient Details  Name: Nicholas Valdez MRN: [id number] Date of Birth: 1981-03-04  Today's Date: 07/27/2020 Time: 1550-1640 SLP Time Calculation (min) (ACUTE ONLY): 50 min  Past Medical History:  Past Medical History:  Diagnosis Date  . Alcohol abuse   . Anxiety   . Depression   . Drug abuse (HCC)   . GERD (gastroesophageal reflux disease)   . Hepatitis C   . Hypertension   . Pneumonia   . Pre-diabetes   . Sleep apnea    Past Surgical History:  Past Surgical History:  Procedure Laterality Date  . GASTROSTOMY W/ FEEDING TUBE Left 07/22/2020  . TRACHEOSTOMY TUBE PLACEMENT N/A 07/10/2020   Procedure: TRACHEOSTOMY;  Surgeon: Bud Face, MD;  Location: [facility] ORS;  Service: ENT;  Laterality: N/A;   HPI:  Nicholas Valdez "Nicholas Valdez" is a 39 year old male with past medical history significant for EtOH and polysubstance abuse admitted with acute metabolic encephalopathy secondary to alcohol withdrawal and acute hypoxic respiratory failure due to encephalopathy and aspiration pneumonia.  Status post tracheostomy and PEG placement.  Pt was intubated at admit on 06/20/2020; trach placed 12/6; PEG placed 12/18. Pt weaned to trach collar O2 support 12/22.  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   Assessment / Plan / Recommendation Clinical Impression  Pt was seen for PMV assessment and toleration of wear for verbal communication. Pt admitted for significant for EtOH and polysubstance abuse admitted with acute metabolic encephalopathy secondary to alcohol withdrawal and acute hypoxic respiratory failure. Pt received tracheostomy on 07/10/2020 s/p oral intubation since 06/20/2020. MD has noted decline in Cognitive functioning d/t reasons for admit. Pt has been on trach collar O2 support for ~1 days weaning from full vent support. He is awake, alert to follow through w/ tasks given Min-Mod cues at times. Easily distracted.  Pt's cuff is deflated at Baseline. He exhibited adequate vocalizations w/ finger occlusion redirecting airflow superiorly w/out discomfort. He tolerated PMV placement for ~30+ mins w/out desat or discomfort reported or noted by this SLP. He verbally communicated wants/needs w/ others including NSG present in room. During pt's PMV use, pt exhibited adequate vocal quality and volume of speech when he increased his effort during tasks. Pt denied any deficits or discomfort when breathing but low volume/effort noted. Unsure if related to pt's decreased level of awareness of self and his verbal communication w/ others. Speech clear and intelligible when he was not "mumbling". No changes in his O2 sats, HR/RR were noted during the sessions. Will discuss w/ MD the opportunity to f/u w/ ENT to downsize trach when appropriate. Discussed wearing the PMV during other tx sessions in order to verbally communicate w/ therapists, NSG. Also encouraged pt to use an Facilities manager for "exercising" his Pulmonary support/strength for verbal communication and overall improvement of status/health. Noted min Impulsivity and distraction in his verbal responses and behaviors. He requires full assist w/ placement of PMV and follow through w/ instructions on wear and care. PMV left in place for communication w/ NSG and calling family, NSG will monitor toleration of wear and remove when sleepy, or indicated. Precautions posted in room, chart. SLP Visit Diagnosis: Aphonia (R49.1) (tracheostomy)    SLP Assessment  Patient needs continued Speech Lanaguage Pathology Services    Follow Up Recommendations   (TBD)    Frequency and Duration min 2x/week  2 weeks    PMSV Trial PMSV was placed for: 30+ mins Able to redirect subglottic  air through upper airway: Yes Able to Attain Phonation: Yes Voice Quality: Low vocal intensity (min; noted min mumbled speech(dec. awareness)) Able to Expectorate Secretions: No attempts Breath  Support for Phonation: Adequate (appeared but spoke softly/mumbled some) Intelligibility: Intelligible (w/ effort) Respirations During Trial: 21 SpO2 During Trial: 99 % Pulse During Trial: 105 Behavior: Alert;Confused;Responsive to questions (decreased awareness; needed cues to improve effort)   Tracheostomy Tube  Additional Tracheostomy Tube Assessment Trach Collar Period: on trach collar for 8+ hours Secretion Description: min Frequency of Tracheal Suctioning: prn Level of Secretion Expectoration: Tracheal    Vent Dependency  Vent Dependent: No FiO2 (%): 35 %    Cuff Deflation Trial  GO Tolerated Cuff Deflation: Yes Length of Time for Cuff Deflation Trial: baseline status Behavior: Alert;Confused;Cooperative;Responsive to questions;Restless (Cognitive decline/insight noted in some responses) Cuff Deflation Trial - Comments: baseline status        Nicholas Valdez 07/27/2020, 4:43 PM Jerilynn Som, MS, CCC-SLP Speech Language Pathologist Rehab Services [phone]

## 2020-07-27 NOTE — Progress Notes (Signed)
Patient continues to do well on trach collar. No distress noted. Able to verbalize. States he is feeling much better. Will continue to monitor

## 2020-07-27 NOTE — Progress Notes (Signed)
NAME:  Nicholas Valdez, MRN:  [id number], DOB:  06-19-1981, LOS: 38 ADMISSION DATE:  06/19/2020,   Brief History   39 year old male with past medical history significant for EtOH and polysubstance abuse admitted with acute metabolic encephalopathy secondary to alcohol withdrawal and acute hypoxic respiratory failure due to encephalopathy and aspiration pneumonia.  Status post tracheostomy.   12/21-22/2021 patient continues to have withdrawal symptoms with aggitation. We have added depakene and dcd haldol.  RD reviewed nourishment and adding prostat. Plan to transition to BIPAP with trache when able.   07/27/20- patient is up working with PT.  He is on trache collar.  He had OT today.  There is recommendation for inpatient rehab on DC. I have signed patient out to Dr Lucianne Muss with [facility] for pick up on medical floor in am.    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 Despite decreasing versed to 7 and completely stopping Propofol patient is only partially arousable. Will continue to wean sedation to RASS-0 with plan for SBT. 06/24/20- patient is being weaned off sedation as possible.  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  12/1- patient became severely aggitated, tachypneic, tachycardic and with heavy endotracheal secretions. He is febrile 12/2-Patient with extremely high tolerance to sedation have been unable to  bring down dosing. Reviewed care plan with mother of patient Nicholas Braun - plan for trache - ENT consult placed. 07/07/2020:Tracheostomy planned for 12/6, remains encephalopathic, does not follow commands, agitation when sedation is lightened. 07/08/2020:Moves all fours, still on vent, less agitated 12/6TRACH placed by ENT 12/8 Nicholas Valdez SPECIES in TA started ABX 12/17: Repeat Tracheal aspirate w/ Nicholas Valdez SPECIES, ID Consulted, General Surgery consulted for PEG 12/18: PEG tube placement  12/20- patient with agitation, multiple times requiring re-positioning. 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   Consults:  PCCM Psychiatry IR Infectious disease General surgery  Procedures:  11/16: ETT 11/16: CVC 3L>>will need PICC line 12/6: Tracheostomy placed by ENT 12/18 PEG tube  Significant Diagnostic Tests:  11/15: CT Head>> negative 11/16: US Abdomen>>. Mild hepatic steatosis 11/17: Echocardiogram>> LVEF 65-70%, no wall motion abnormalities, no other abnormalities noted 12/16: CT Abdomen/Pelvis>>Bibasilar pulmonary infiltrates, likely infectious or inflammatory and suggestive of atypical infection or aspiration. Mild hepatosplenomegaly. Nasogastric tube extends into the mid to distal body of the stomach. In this region, there is an appropriate tract for potential percutaneous access of the stomach. 12/17: CT head>>1. No intracranial abnormality. 2. Bilateral mastoid effusions and fluid in the middle ears. 3. Mucosal inflammatory changes of the paranasal sinuses. 12/17: CT chest>> multifocal airspace disease, tracheostomy in stable position.  Micro Data:  11/15: COVID-19 PCR>> negative 11/15: Influenza PCR>> negative 11/16: MRSA PCR>> negative 11/16: Tracheal aspirate>>negative 11/16: HIV screen>>nonreactive 11/18: Sputum>> no growth 11/21: Sputum>>rare normal respiratory flora 12/1: Sputum >> Normal  respiratory flora 12/6: Tracheal aspirate>>Nicholas Valdez MENINGOSEPTICA  12/6: MRSA PCR>>negative 12/14: Tracheal aspirate>> FEW Nicholas Valdez MIRICOLA (susceptiblities to follow) 07/22/20  FEW FLAVOBACTERIUM MENINGOSEPTICUM   Report Status 07/22/2020  FINAL   Organism ID, Bacteria FLAVOBACTERIUM MENINGOSEPTICUM   Resulting Agency [facility]      Susceptibility   Flavobacterium meningosepticum    MIC    CEFAZOLIN >=64 RESIST... Resistant    CEFTAZIDIME >=64 RESIST... Resistant    CIPROFLOXACIN 0.5 SENSITIVE  Sensitive    GENTAMICIN >=16 RESIST... Resistant    IMIPENEM >=16 RESIST... Resistant    PIP/TAZO >=128 RESIS... Resistant    TRIMETH/SULFA 40 SENSITIVE  Sensitive            Antimicrobials:  Unasyn 11/17>>11/23; 11/29>>12/3 Vancomycin 11/21 x1 dose Fluconazole 12/1>>12/8 Minocycline 12/8>>12/14  Ciprofloxacin 12/17 >>   CC  follow up resp failure  INTERVAL HISTORY No acute events.  Continue sedation and propofol Has significant agitation when sedation is being  Objective   Blood pressure (!) 139/103, pulse 84, temperature 98.1 F (36.7 C), temperature source Axillary, resp. rate 16, height 5\' 10"  (1.778 m), weight (!) 136.4 kg, SpO2 100 %.    FiO2 (%):  [35 %] 35 %   Intake/Output Summary (Last 24 hours) at 07/27/2020 0834 Last data filed at 07/27/2020 0400 Gross per 24 hour  Intake 854.61 ml  Output 2475 ml  Net -1620.39 ml   Filed Weights   07/22/20 0500 07/25/20 0500 07/27/20 0356  Weight: (!) 144.3 kg (!) 141.5 kg (!) 136.4 kg    REVIEW OF SYSTEMS  PATIENT IS UNABLE TO PROVIDE COMPLETE REVIEW OF SYSTEM S DUE TO SEVERE CRITICAL ILLNESS AND ENCEPHALOPATHY  PHYSICAL EXAMINATION: Gen:      No acute distress, chronically ill-appearing HEENT:  EOMI, sclera anicteric-  Neck:     No masses; no thyromegaly, trach Lungs:    Clear to auscultation bilaterally; normal respiratory effort CV:         Regular rate and rhythm; no murmurs Abd:       + bowel sounds; soft, non-tender; no palpable masses, no distension Ext:    No edema; adequate peripheral perfusion Skin:      Warm and dry; no rash Neuro: Sedated     Assessment & Plan:    Acute Hypoxic Respiratory failure secondary to acute encephalopathy due to Alcohol withdrawal s/p trach +Acute Hypoxic Respiratory failure secondary to acute encephalopathy due to Alcohol withdrawal +Nicholas Valdez SPECIES in TA started ABX Pneumonia, failure to wean from vent and severe agitation from ETOH abuse with signs and sympoms of brain damage at micro cellular level   Ventilator Dependent Respiratory Failure secondary to Acute Encephalopathy due to ETOH withdrawal and Aspiration Pneumonia Continue vent support Wean PEEP/FiO2 Intermittent chest x-ray Bronchodilators as needed   Aspiration Pneumonia>>treated with Unasyn Ventilator Associated Pneumonia (12/6: Tracheal aspirate>>Nicholas Valdez MENINGOSEPTICA)>>treated with Minocycline Appreciate ID consult Started on ciprofloxacin  Acute Toxic Metabolic Encephalopathy -Provide supportive care Off Dilaudid drip.  Wean down propofol as tolerated. -Continue scheduled oxycodone and Valium, fentanyl patch, Seroquel increased to 100 mg twice daily -Psych following, appreciate input CT head with no acute abnormality.   Anemia without s/sx of bleeding Monitor labs, transfuse for hemoglobin less than seven  Hyperglycemia SSI   GI -GI PROPHYLAXIS as indicated   NUTRITIONAL STATUS/DIET -Dietician following, TF's as tolerated -Constipation protocol as indicated -General surgery consulted for PEG tube placement, tentative plan for PEG on 12/18   BEST PRACTICES DISPOSITION: ICU GOALS OF CARE: Full Code VTE PROPHYLAXIS: Lovenox SUP: Protonix UPDATES: Updated Mom on 12/19  The patient is critically ill with multiple organ system failure and requires high complexity decision making for assessment and support, frequent  evaluation  and titration of therapies, advanced monitoring, review of radiographic studies and interpretation of complex data.   Critical Care Time devoted to patient care services, exclusive of separately billable procedures, described in this note is 35 minutes.    Vida Rigger, M.D.  Pulmonary & Critical Care Medicine  [facility]

## 2020-07-27 NOTE — Progress Notes (Signed)
Patient resting well on trach collar in no distress. Vent remains on standby

## 2020-07-27 NOTE — Progress Notes (Signed)
Rehab Admissions Coordinator Note:  Patient was screened by Clois Dupes for appropriateness for an Inpatient Acute Rehab Consult per therapy recommendation. .Bed mobility assessments thus far. I await further progression with therapy tolerance before pursuing a CIR rehab consult. I will follow.  Clois Dupes RN MSN 07/27/2020, 6:28 PM  I can be reached at [phone].

## 2020-07-27 NOTE — Progress Notes (Signed)
PHARMACY CONSULT NOTE  Pharmacy Consult for Electrolyte Monitoring and Replacement   Recent Labs: Potassium (mmol/L)  Date Value  07/26/2020 3.5  02/12/2013 3.9   Magnesium (mg/dL)  Date Value  38/05/1750 2.1   Calcium (mg/dL)  Date Value  02/58/5277 9.2   Calcium, Total (mg/dL)  Date Value  82/42/3536 8.7   Albumin (g/dL)  Date Value  14/43/1540 3.2 (L)  02/12/2013 3.6   Phosphorus (mg/dL)  Date Value  08/67/6195 5.0 (H)   Sodium (mmol/L)  Date Value  07/26/2020 135  02/12/2013 139   Assessment: 39 year old male here with encephalopathy s/t alcohol withdrawal. Patient has been intubated and sedated since 11/16. 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 Pharmacy to manage electrolytes.  Goal of Therapy:  Electrolytes WNL  Plan:   No replacement indicated today.  Will continue to follow electrolytes   Tressie Ellis 07/27/2020 8:40 AM

## 2020-07-27 NOTE — Evaluation (Addendum)
Occupational Therapy Evaluation Patient Details Name: Nicholas Valdez MRN: [id number] DOB: 1981/07/25 Today's Date: 07/27/2020    History of Present Illness Nicholas Valdez "Nicholas Valdez" is a 39 year old male with past medical history significant for EtOH and polysubstance abuse admitted with acute metabolic encephalopathy secondary to alcohol withdrawal and acute hypoxic respiratory failure due to encephalopathy and aspiration pneumonia.  Status post tracheostomy.   Clinical Impression   Nicholas Valdez" Valdez was seen for OT/PT co-evaluation this date. Pt received in CCU, on trach collar. He is limited in functional communication/cognition so information regarding PLOF/home set-up primarily obtained from secure phone conversation with mother. Pt is able to finger spell some words using the ASL alphabet and writes with some difficulty during session. Per pt/mother pt was active and independent at baseline. He does not require assist for BADL tasks and spends time caring for his 75 y.o. daughter. Pt reports he has worked intermittently, generally active jobs. Pt lives with his mother, father, and 36 y.o. daughter in a 1 level home with 2 steps to enter.  Currently pt demonstrates impairments as described below (See OT problem list) which functionally limit his ability to perform ADL/self-care tasks. Pt currently requires MOD A for LB ADL management in a seated position, MIN A for bed mobility, and anticipate MOD A with +2 for safety during functional mobility.  Pt would benefit from skilled OT services to address noted impairments and functional limitations (see below for any additional details) in order to maximize safety and independence while minimizing falls risk and caregiver burden. Pt is motivated to return to PLOF with improved independence Upon hospital discharge, recommend acute inpatient rehabilitative services to maximize pt safety and return to PLOF.      Follow Up Recommendations  CIR     Equipment Recommendations  3 in 1 bedside commode    Recommendations for Other Services       Precautions / Restrictions Precautions Precautions: Fall Restrictions Weight Bearing Restrictions: No      Mobility Bed Mobility Overal bed mobility: Needs Assistance Bed Mobility: Supine to Sit;Sit to Supine     Supine to sit: HOB elevated;Min guard Sit to supine: Min assist;HOB elevated   General bed mobility comments: Increased time/effort to perform with MIN A for BLE mgt for sit>sup.    Transfers                 General transfer comment: Deferred. Pt declines functional transfers at this time. Eager to work with SLP. Will continue to assess.    Balance Overall balance assessment: Needs assistance Sitting-balance support: Feet supported;Bilateral upper extremity supported;Single extremity supported Sitting balance-Nicholas Valdez Scale: Fair Sitting balance - Comments: Variable during session, and requires +2 to maintain sitting balance upon initial sup>sit at EOB.  Ultimately pt able to maintain static sitting with 1 UE support with supervision for safety. Postural control: Posterior lean;Right lateral lean     Standing balance comment: Not tested.                           ADL either performed or assessed with clinical judgement   ADL Overall ADL's : Needs assistance/impaired   Eating/Feeding Details (indicate cue type and reason): NPO at this time. will continue to assess. Grooming: Sitting;Min guard   Upper Body Bathing: Sitting;Cueing for safety;Minimal assistance   Lower Body Bathing: Moderate assistance;Sitting/lateral leans;Cueing for safety       Lower Body Dressing: Moderate assistance;Cueing for safety;Sit to/from stand  Toilet Transfer: Moderate assistance;BSC;Stand-pivot   Toileting- Clothing Manipulation and Hygiene: Sit to/from stand;Cueing for sequencing;Cueing for safety;Moderate assistance         General ADL Comments: Functional  mobility assessment limited, pt declines STS transfers at this time. Performs bed mobility with min A for BLE mgt. Variable assist to maintain seated balance at EOB, occasionally requiring +2 to maintain sitting balance.  Anticipate MOD A with +2 for safety/chair follow for functional mobility.     Vision Baseline Vision/History: Wears glasses Wears Glasses: At all times Patient Visual Report: No change from baseline       Perception     Praxis      Pertinent Vitals/Pain Pain Assessment: Faces Faces Pain Scale: Hurts a little bit Pain Location: Neck, near trach collar and abdomen near G-tube. Pain Descriptors / Indicators: Discomfort;Guarding;Grimacing Pain Intervention(s): Limited activity within patient's tolerance;Monitored during session;Repositioned     Hand Dominance Right   Extremity/Trunk Assessment Upper Extremity Assessment Upper Extremity Assessment: Generalized weakness (Globally weak/deconditioned grossly 3/5 BUE with no focal deficits appreciated. Decreased FMC/GMC appreciated with functional tasks including writing and seated balance activities.)   Lower Extremity Assessment Lower Extremity Assessment: Generalized weakness;Defer to PT evaluation   Cervical / Trunk Assessment Cervical / Trunk Assessment: Normal   Communication Communication Communication: Tracheostomy;Passy-Muir valve   Cognition Arousal/Alertness: Awake/alert Behavior During Therapy: WFL for tasks assessed/performed Overall Cognitive Status: Difficult to assess Area of Impairment: Rancho level;Safety/judgement;Following commands               Rancho Levels of Cognitive Functioning Rancho Los Amigos Scales of Cognitive Functioning: Confused/inappropriate/non-agitated       Following Commands: Follows one step commands with increased time Safety/Judgement: Decreased awareness of safety;Decreased awareness of deficits     General Comments: Variable Rancho 5/6 during session, at  times appropriate and follows simple VCs, other times more agitated/tearful. Disoriented to date/situation. Able to state place as IC15 (Current room number). [place] difficult to assess 2/2 impaired communication. Will continue to monitor. Of note, pt has been confused/agitated with nsg staff recently on 4-point restraints.   General Comments       Exercises Other Exercises Other Exercises: Pt educated on role of OT in acute setting, importance of mobility during hospitalization, extensive time taken to assist pt with functional communication via writing and sign language (pt able to finger spell using ASL alphabet). Consistent cueing for safety during bed mobility.   Shoulder Instructions      Home Living Family/patient expects to be discharged to:: Private residence Living Arrangements: Parent;Children Available Help at Discharge: Family;Available 24 hours/day Type of Home: House Home Access: Stairs to enter [employer] of Steps: 2 Entrance Stairs-Rails: Left;Right;Can reach both Home Layout: One level     Bathroom Shower/Tub: IT trainer: Standard     Home Equipment: None   Additional Comments: Pt awaiting SLP evaluation for [employer], becomes frustrated attempting to provide information regarding PLOF/home set-up. Information regarding home-set up/PLOF obtained from mother via phone call.      Prior Functioning/Environment Level of Independence: Independent        Comments: Per pt/mother, pt is totally independent at baseline. Living with his parents and 82 y.o. daughter. Does not using AE for functional mobility.        OT Problem List: Decreased strength;Decreased coordination;Decreased range of motion;Decreased cognition;Decreased activity tolerance;Decreased safety awareness;Impaired balance (sitting and/or standing);Decreased knowledge of use of DME or AE      OT Treatment/Interventions: Self-care/ADL training;Therapeutic  exercise;Therapeutic activities;DME and/or AE instruction;Patient/family education;Balance training;Cognitive remediation/compensation    OT Goals(Current goals can be found in the care plan section) Acute Rehab OT Goals Patient Stated Goal: Pt unable to state, family would like him to go to inpatient rehabilitation OT Goal Formulation: With family Time For Goal Achievement: 08/10/20 Potential to Achieve Goals: Good ADL Goals Pt Will Perform Grooming: sitting;with modified independence (c LRAD PRN for improved safety and functional indep.) Pt Will Perform Lower Body Dressing: sit to/from stand;with set-up;with supervision (c LRAD PRN for improved safety and functional indep.) Pt Will Transfer to Toilet: bedside commode;with set-up;with supervision (c LRAD PRN for improved safety and functional indep.) Pt Will Perform Toileting - Clothing Manipulation and hygiene: sit to/from stand;with modified independence;with adaptive equipment (c LRAD PRN for improved safety and functional indep.)  OT Frequency: Min 3X/week   Barriers to D/C: Inaccessible home environment          Co-evaluation              AM-PAC OT "6 Clicks" Daily Activity     Outcome Measure Help from another person eating meals?: A Little (based on function only, currently NPO) Help from another person taking care of personal grooming?: A Little Help from another person toileting, which includes using toliet, bedpan, or urinal?: A Lot Help from another person bathing (including washing, rinsing, drying)?: A Lot Help from another person to put on and taking off regular upper body clothing?: A Little Help from another person to put on and taking off regular lower body clothing?: A Lot 6 Click Score: 15   End of Session    Activity Tolerance: Patient tolerated treatment well Patient left: in bed;with call bell/phone within reach;Other (comment) (With PT in room to finish session.)  OT Visit Diagnosis: Other  abnormalities of gait and mobility (R26.89);Muscle weakness (generalized) (M62.81)                Time: 2876-8115 OT Time Calculation (min): 44 min Charges:  OT General Charges $OT Visit: 1 Visit OT Evaluation $OT Eval High Complexity: 1 High OT Treatments $Self Care/Home Management : 8-22 mins  Rockney Ghee, M.S., OTR/L Ascom: [phone] 07/27/20, 4:19 PM

## 2020-07-27 NOTE — Evaluation (Signed)
Physical Therapy Evaluation Patient Details Name: Nicholas Valdez MRN: [id number] DOB: 08-Aug-1980 Today's Date: 07/27/2020   History of Present Illness  Nicholas Valdez "Nicholas Valdez" is a 39 year old male with past medical history significant for EtOH and polysubstance abuse admitted with acute metabolic encephalopathy secondary to alcohol withdrawal and acute hypoxic respiratory failure due to encephalopathy and aspiration pneumonia.  Status post tracheostomy.  Clinical Impression  Pt still with some confusion/issues related to >1 month on vent, and is functionally weak and limited, however he was  able to participate with some EOB weight shifting activities and showed surprisingly good U&LEs strength given his prolonged inactivity.  Pt has had elevated HR, in getting to sitting HR rose to 120s and slowly over a few minutes of sitting with modest activity/just maintaining balance it crept into the mid 130 and we finally had to lay back down.  Pt fatigued with the effort/eval, but again did surprisingly well given the course of this hospitalization thus far.  Follow Up Recommendations CIR    Equipment Recommendations   (per progress, will likely need at least a RW and 3-in-1)    Recommendations for Other Services       Precautions / Restrictions Precautions Precautions: Fall Restrictions Weight Bearing Restrictions: No      Mobility  Bed Mobility Overal bed mobility: Needs Assistance Bed Mobility: Supine to Sit;Sit to Supine     Supine to sit: HOB elevated;Min guard Sit to supine: Min assist;HOB elevated   General bed mobility comments: Extra cuing/encouragement/effort to rise to sitting, but did surprisingly well with elevating trunk and getting LEs toward EOB.  Did need some assist with getting LEs back into bed after sitting    Transfers                 General transfer comment: Deferred despite encouragement, affirms that he does need to start increasing activity as  he is able but is feeling too frustrated with his situation and generally too weak to try.He was more eager to work with SLP. Will continue to assess.  Ambulation/Gait                Stairs            Wheelchair Mobility    Modified Rankin (Stroke Patients Only)       Balance Overall balance assessment: Needs assistance Sitting-balance support: Feet supported;Bilateral upper extremity supported;Single extremity supported Sitting balance-Leahy Scale: Fair Sitting balance - Comments: Variable during session, and requires +2 to maintain sitting balance upon initial sup>sit at EOB.  Ultimately pt able to maintain static sitting with 1 UE support with supervision for safety. Postural control: Posterior lean;Right lateral lean     Standing balance comment: unsafe to attempt                             Pertinent Vitals/Pain Pain Assessment: Faces Faces Pain Scale: Hurts a little bit Pain Location: back, stomach Pain Descriptors / Indicators: Aching;Constant Pain Intervention(s): Repositioned;Relaxation (NSG aware)    Home Living Family/patient expects to be discharged to:: Private residence Living Arrangements: Parent;Children Available Help at Discharge: Family;Available 24 hours/day Type of Home: House Home Access: Stairs to enter Entrance Stairs-Rails: Left;Right;Can reach both Entrance Stairs-Number of Steps: 2 Home Layout: One level Home Equipment: None Additional Comments: gathered from OT assessment, pt communicating mostly via writting    Prior Function Level of Independence: Independent  Comments: Per pt/mother, pt is totally independent at baseline. Living with his parents and 31 y.o. daughter. Does not using AE for functional mobility.     Hand Dominance   Dominant Hand: Right    Extremity/Trunk Assessment   Upper Extremity Assessment Upper Extremity Assessment: Generalized weakness    Lower Extremity Assessment Lower  Extremity Assessment: Generalized weakness    Cervical / Trunk Assessment Cervical / Trunk Assessment: Normal  Communication   Communication: Tracheostomy  Cognition Arousal/Alertness: Awake/alert Behavior During Therapy: WFL for tasks assessed/performed Overall Cognitive Status: Difficult to assess Area of Impairment: Rancho level;Safety/judgement;Following commands               Rancho Levels of Cognitive Functioning Rancho Los Amigos Scales of Cognitive Functioning: Confused/inappropriate/non-agitated       Following Commands: Follows one step commands with increased time Safety/Judgement: Decreased awareness of safety;Decreased awareness of deficits     General Comments: coppied from same day OT assessment/co-treat: Variable Rancho 5/6 during session, at times appropriate and follows simple VCs, other times more agitated/tearful. Disoriented to date/situation. Able to state place as IC15 (Current room number). [place] difficult to assess 2/2 impaired communication. Will continue to monitor. Of note, pt has been confused/agitated with nsg staff recently on 4-point restraints.      General Comments      Exercises Other Exercises Other Exercises: performed a few reps of simple b/l LE exercises with light resistance per pt tolerance, deferred extensive exercises 2/2 fatigued from sitting and eager to work with speech (motivated to start eating).   Assessment/Plan    PT Assessment    PT Problem List Decreased strength;Decreased range of motion;Decreased activity tolerance;Decreased balance;Decreased mobility;Decreased coordination;Decreased cognition;Decreased knowledge of use of DME;Decreased safety awareness;Cardiopulmonary status limiting activity;Impaired sensation       PT Treatment Interventions DME instruction;Gait training;Stair training;Functional mobility training;Therapeutic activities;Therapeutic exercise;Balance training;Neuromuscular re-education;Cognitive  remediation;Patient/family education    PT Goals (Current goals can be found in the Care Plan section)  Acute Rehab PT Goals Patient Stated Goal: Pt unable to state, family would like him to go to inpatient rehabilitation PT Goal Formulation: With family Time For Goal Achievement: 08/10/20 Potential to Achieve Goals: Good    Frequency Min 2X/week   Barriers to discharge        Co-evaluation PT/OT/SLP Co-Evaluation/Treatment: Yes Reason for Co-Treatment: Necessary to address cognition/behavior during functional activity;Complexity of the patient's impairments (multi-system involvement);For patient/therapist safety;To address functional/ADL transfers PT goals addressed during session: Balance;Mobility/safety with mobility;Strengthening/ROM         AM-PAC PT "6 Clicks" Mobility  Outcome Measure Help needed turning from your back to your side while in a flat bed without using bedrails?: A Little Help needed moving from lying on your back to sitting on the side of a flat bed without using bedrails?: A Little Help needed moving to and from a bed to a chair (including a wheelchair)?: Total Help needed standing up from a chair using your arms (e.g., wheelchair or bedside chair)?: Total Help needed to walk in hospital room?: Total Help needed climbing 3-5 steps with a railing? : Total 6 Click Score: 10    End of Session   Activity Tolerance: Patient limited by fatigue Patient left: in bed;with call bell/phone within reach;with nursing/sitter in room Nurse Communication: Mobility status PT Visit Diagnosis: Muscle weakness (generalized) (M62.81);Difficulty in walking, not elsewhere classified (R26.2);Unsteadiness on feet (R26.81)    Time: 1430-1500 PT Time Calculation (min) (ACUTE ONLY): 30 min   Charges:   PT  Evaluation $PT Eval Moderate Complexity: 1 Mod          Malachi Pro, DPT 07/27/2020, 4:55 PM

## 2020-07-28 DIAGNOSIS — F10231 Alcohol dependence with withdrawal delirium: Secondary | ICD-10-CM | POA: Diagnosis not present

## 2020-07-28 LAB — GLUCOSE, CAPILLARY
Glucose-Capillary: 110 mg/dL — ABNORMAL HIGH (ref 70–99)
Glucose-Capillary: 103 mg/dL — ABNORMAL HIGH (ref 70–99)
Glucose-Capillary: 111 mg/dL — ABNORMAL HIGH (ref 70–99)
Glucose-Capillary: 105 mg/dL — ABNORMAL HIGH (ref 70–99)
Glucose-Capillary: 118 mg/dL — ABNORMAL HIGH (ref 70–99)

## 2020-07-28 LAB — CBC
HCT: 29.9 % — ABNORMAL LOW (ref 39.0–52.0)
Hemoglobin: 9.8 g/dL — ABNORMAL LOW (ref 13.0–17.0)
MCH: 33.1 pg (ref 26.0–34.0)
MCHC: 32.8 g/dL (ref 30.0–36.0)
MCV: 101 fL — ABNORMAL HIGH (ref 80.0–100.0)
Platelets: 325 10*3/uL (ref 150–400)
RBC: 2.96 MIL/uL — ABNORMAL LOW (ref 4.22–5.81)
RDW: 14.1 % (ref 11.5–15.5)
WBC: 9.5 10*3/uL (ref 4.0–10.5)
nRBC: 0 % (ref 0.0–0.2)

## 2020-07-28 LAB — BASIC METABOLIC PANEL
Anion gap: 9 (ref 5–15)
BUN: 24 mg/dL — ABNORMAL HIGH (ref 6–20)
CO2: 28 mmol/L (ref 22–32)
Calcium: 8.9 mg/dL (ref 8.9–10.3)
Chloride: 100 mmol/L (ref 98–111)
Creatinine, Ser: 0.71 mg/dL (ref 0.61–1.24)
GFR, Estimated: >60 mL/min (ref >60)
Glucose, Bld: 113 mg/dL — ABNORMAL HIGH (ref 70–99)
Potassium: 3.4 mmol/L — ABNORMAL LOW (ref 3.5–5.1)
Sodium: 137 mmol/L (ref 135–145)

## 2020-07-28 LAB — MAGNESIUM: Magnesium: 2.1 mg/dL (ref 1.7–2.4)

## 2020-07-28 LAB — PHOSPHORUS: Phosphorus: 4.9 mg/dL — ABNORMAL HIGH (ref 2.5–4.6)

## 2020-07-28 MED ORDER — AMIODARONE HCL IN DEXTROSE 360-4.14 MG/200ML-% IV SOLN
INTRAVENOUS | Status: AC
  Filled 2020-07-28: qty 200

## 2020-07-28 MED ORDER — POTASSIUM CHLORIDE 20 MEQ PO PACK
40.0000 meq | PACK | Freq: Once | ORAL | Status: AC
  Administered 2020-07-28: 40 meq via ORAL
  Filled 2020-07-28: qty 2

## 2020-07-28 MED ORDER — ENSURE ENLIVE PO LIQD
237.0000 mL | Freq: Three times a day (TID) | ORAL | Status: DC
  Administered 2020-07-28 – 2020-08-04 (×17): 237 mL via ORAL

## 2020-07-28 MED ORDER — FREE WATER: 30.0000 mL | Status: DC

## 2020-07-28 MED ORDER — FREE WATER
60.0000 mL | Status: DC
  Administered 2020-07-28 – 2020-08-03 (×6): 60 mL

## 2020-07-28 MED ORDER — AMIODARONE IV BOLUS ONLY 150 MG/100ML
INTRAVENOUS | Status: AC
  Filled 2020-07-28: qty 100

## 2020-07-28 MED ORDER — PROSOURCE TF PO LIQD
45.0000 mL | Freq: Three times a day (TID) | ORAL | Status: DC
  Administered 2020-07-28 – 2020-08-04 (×11): 45 mL
  Filled 2020-07-28 (×23): qty 45

## 2020-07-28 MED ORDER — OXYCODONE HCL 5 MG PO TABS
5.0000 mg | ORAL_TABLET | Freq: Once | ORAL | Status: AC
  Administered 2020-07-28: 5 mg
  Filled 2020-07-28: qty 1

## 2020-07-28 NOTE — Progress Notes (Addendum)
PHARMACY CONSULT NOTE  Pharmacy Consult for Electrolyte Monitoring and Replacement   Recent Labs: Potassium (mmol/L)  Date Value  07/28/2020 3.4 (L)  02/12/2013 3.9   Magnesium (mg/dL)  Date Value  35/46/5681 2.1   Calcium (mg/dL)  Date Value  27/51/7001 8.9   Calcium, Total (mg/dL)  Date Value  74/94/4967 8.7   Albumin (g/dL)  Date Value  59/16/3846 3.2 (L)  02/12/2013 3.6   Phosphorus (mg/dL)  Date Value  65/99/3570 4.9 (H)   Sodium (mmol/L)  Date Value  07/28/2020 137  02/12/2013 139   Assessment: 39 year old male here with encephalopathy s/t alcohol withdrawal. Patient has been intubated and sedated since 11/16. 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 Pharmacy to manage electrolytes.  Goal of Therapy:  Electrolytes WNL  Plan:   K 3.4, will order KCl 40 mEq x 1 PO  Will continue to follow electrolytes   Tressie Ellis 07/28/2020 9:35 AM

## 2020-07-28 NOTE — Progress Notes (Signed)
PROGRESS NOTE    Nicholas Valdez  JHE:[id number] DOB: 1980-10-10 DOA: 06/19/2020 PCP: Oswaldo Conroy, MD   Brief Narrative:  This 39 years old male with PMH significant for EtOH and polysubstance abuse admitted with acute metabolic encephalopathy secondary to alcohol withdrawal and acute hypoxic respiratory failure due to aspiration pneumonia and encephalopathy.  Patient is s/p tracheostomy. Patient has prolonged ICU course, continues to have withdrawal symptoms with agitation requiring Precedex drip.  Patient is much improved alert and oriented, participated in conversation.  PCCM pickup 12/24.  Patient has participated with physical therapy PT recommended CIR.  Assessment & Plan:   Principal Problem:   Alcohol withdrawal delirium (HCC) Active Problems:   Increased ammonia level   Acute metabolic encephalopathy   Elevated LFTs   Tobacco abuse   Elevated lactic acid level   Acute respiratory failure with hypoxia (HCC)   Pressure injury of skin   Acute hypoxic respiratory failure secondary to acute encephalopathy due to alcohol withdrawal. Patient was intubated on arrival,  admitted in the ICU and had a prolonged ICU course. Patient remained intubated, was tried to extubate several times but was not successful. Patient had tracheostomy tube placed by ENT. Patient has episodes of agitation,  requiring Precedex drip. Patient is stable, PCCM pickup 12/24. Patient also found to have pneumonia, tracheal aspirate grew ElizabethKingia species. Infectious disease consulted, continued on antibiotics. Patient has completed 7 days of treatment for ElizabethKingia  In endotracheal culture with concern for nosocomial pneumonia Continue bronchodilators as ordered. Aspiration pneumonia: Completed antibiotic course. Speech and swallow evaluation completed   Acute Toxic Metabolic Encephalopathy: Continue supportive care Continue scheduled oxycodone and Valium, fentanyl patch, Seroquel  increased to 100 mg twice daily -Psych following, appreciate input CT head with no acute abnormality.   Hepatitis C: Continue outpatient treatment  Chronic anemia without s/sx of bleeding: Hemoglobin remains stable 9.8. We will recheck CBC. Consider transfusion if hemoglobin drops below 7.   Hyperglycemia: Regular insulin sliding scale.   DVT prophylaxis: Lovenox Code Status: Full code Family Communication: No family at bedside Disposition Plan:  Status is: Inpatient  Remains inpatient appropriate because:Inpatient level of care appropriate due to severity of illness   Dispo: The patient is from: Home              Anticipated d/c is to: CIR              Anticipated d/c date is: > 3 days              Patient currently is not medically stable to d/c.    Consultants:   PCCM, ENT, psychiatry  Procedures: S/p tracheostomy, status post PEG tube. Antimicrobials:  Anti-infectives (From admission, onward)   Start     Dose/Rate Route Frequency Ordered Stop   07/21/20 1700  ciprofloxacin (CIPRO) IVPB 400 mg  Status:  Discontinued        400 mg 200 mL/hr over 60 Minutes Intravenous Every 12 hours 07/21/20 1543 07/27/20 1056   07/13/20 0800  minocycline (MINOCIN) capsule 100 mg       "Followed by" Linked Group Details   100 mg Per Tube 2 times daily 07/12/20 1305 07/18/20 1958   07/12/20 1400  minocycline (MINOCIN) capsule 200 mg       "Followed by" Linked Group Details   200 mg Per Tube  Once 07/12/20 1305 07/12/20 1447   07/06/20 2200  fluconazole (DIFLUCAN) tablet 100 mg  Status:  Discontinued  100 mg Per Tube Daily at bedtime 07/05/20 1525 07/12/20 0908   07/06/20 0400  anidulafungin (ERAXIS) 50 mg in sodium chloride 0.9 % 50 mL IVPB  Status:  Discontinued       "Followed by" Linked Group Details   50 mg 78 mL/hr over 50 Minutes Intravenous Every 24 hours 07/05/20 0313 07/05/20 1525   07/05/20 2200  fluconazole (DIFLUCAN) tablet 200 mg        200 mg Per Tube   Once 07/05/20 1525 07/05/20 2211   07/05/20 0400  anidulafungin (ERAXIS) 100 mg in sodium chloride 0.9 % 100 mL IVPB       "Followed by" Linked Group Details   100 mg 78 mL/hr over 100 Minutes Intravenous  Once 07/05/20 0313 07/05/20 0554   07/03/20 0500  Ampicillin-Sulbactam (UNASYN) 3 g in sodium chloride 0.9 % 100 mL IVPB  Status:  Discontinued        3 g 200 mL/hr over 30 Minutes Intravenous Every 6 hours 07/03/20 0349 07/07/20 1331   06/26/20 0000  vancomycin (VANCOREADY) IVPB 1750 mg/350 mL  Status:  Discontinued        1,750 mg 175 mL/hr over 120 Minutes Intravenous Every 24 hours 06/24/20 2330 06/27/20 1123   06/25/20 0015  vancomycin (VANCOREADY) IVPB 2000 mg/400 mL       "Followed by" Linked Group Details   2,000 mg 200 mL/hr over 120 Minutes Intravenous  Once 06/24/20 2325 06/25/20 0204   06/25/20 0015  vancomycin (VANCOREADY) IVPB 500 mg/100 mL       "Followed by" Linked Group Details   500 mg 100 mL/hr over 60 Minutes Intravenous  Once 06/24/20 2325 06/25/20 0105   06/21/20 1000  Ampicillin-Sulbactam (UNASYN) 3 g in sodium chloride 0.9 % 100 mL IVPB  Status:  Discontinued        3 g 200 mL/hr over 30 Minutes Intravenous Every 6 hours 06/21/20 0925 06/27/20 1123      Subjective: Patient was seen and examined at bedside.  Overnight events noted.  Patient is alert and oriented x 3,  participated in full conversation.  Patient has a tracheostomy tube and PEG tube.  Objective: Vitals:   07/28/20 0814 07/28/20 1000 07/28/20 1100 07/28/20 1200  BP:  119/80  (!) 105/57  Pulse: (!) 106 (!) 102  (!) 105  Resp: 15 12  16   Temp:    97.9 F (36.6 C)  TempSrc:    Oral  SpO2: 98% 100% 99% 97%  Weight:      Height:        Intake/Output Summary (Last 24 hours) at 07/28/2020 1545 Last data filed at 07/28/2020 1200 Gross per 24 hour  Intake 100 ml  Output 1235 ml  Net -1135 ml   Filed Weights   07/25/20 0500 07/27/20 0356 07/28/20 0330  Weight: (!) 141.5 kg (!) 136.4 kg  (!) 137.2 kg    Examination:  General exam: Appears calm and comfortable , not in any distress, tracheostomy tube noted Respiratory system: Clear to auscultation. Respiratory effort normal. Cardiovascular system: S1 & S2 heard, RRR. No JVD, murmurs, rubs, gallops or clicks. No pedal edema. Gastrointestinal system: Abdomen is nondistended, soft and nontender. No organomegaly or masses felt.  PEG tube noted.  No signs of infection.  Normal bowel sounds heard. Central nervous system: Alert and oriented. No focal neurological deficits. Extremities: No edema, no cyanosis, no clubbing. Skin: No rashes, lesions or ulcers Psychiatry: Judgement and insight appear normal. Mood & affect appropriate.  Data Reviewed: I have personally reviewed following labs and imaging studies  CBC: Recent Labs  Lab 07/22/20 0418 07/24/20 0510 07/25/20 0555 07/26/20 0403 07/27/20 0606 07/28/20 0423  WBC 9.0 11.4* 12.5* 11.0* 9.5 9.5  NEUTROABS 5.3  --   --   --   --   --   HGB 8.8* 8.5* 9.3* 9.6* 10.8* 9.8*  HCT 26.6* 25.0* 27.3* 27.4* 32.2* 29.9*  MCV 102.7* 99.2 98.9 97.2 99.1 101.0*  PLT 323 300 342 315 325 325   Basic Metabolic Panel: Recent Labs  Lab 07/23/20 0504 07/24/20 0510 07/25/20 0555 07/26/20 0403 07/27/20 0606 07/28/20 0423  NA 138 139 139 135  --  137  K 3.7 3.3* 3.7 3.5  --  3.4*  CL 100 101 101 99  --  100  CO2 26 29 27 27   --  28  GLUCOSE 126* 110* 115* 119*  --  113*  BUN 18 16 14 14   --  24*  CREATININE 0.74 0.75 0.76 0.80  --  0.71  CALCIUM 9.0 8.8* 9.3 9.2  --  8.9  MG 2.0 1.7 2.1 2.0 2.1 2.1  PHOS 6.4* 4.6 5.2* 4.3 5.0* 4.9*   GFR: Estimated Creatinine Clearance: 176.5 mL/min (by C-G formula based on SCr of 0.71 mg/dL). Liver Function Tests: No results for input(s): AST, ALT, ALKPHOS, BILITOT, PROT, ALBUMIN in the last 168 hours. No results for input(s): LIPASE, AMYLASE in the last 168 hours. No results for input(s): AMMONIA in the last 168 hours. Coagulation  Profile: No results for input(s): INR, PROTIME in the last 168 hours. Cardiac Enzymes: No results for input(s): CKTOTAL, CKMB, CKMBINDEX, TROPONINI in the last 168 hours. BNP (last 3 results) No results for input(s): PROBNP in the last 8760 hours. HbA1C: No results for input(s): HGBA1C in the last 72 hours. CBG: Recent Labs  Lab 07/27/20 1937 07/27/20 2319 07/28/20 0328 07/28/20 0725 07/28/20 1153  GLUCAP 111* 124* 110* 103* 111*   Lipid Profile: Recent Labs    07/27/20 0606  TRIG 164*   Thyroid Function Tests: No results for input(s): TSH, T4TOTAL, FREET4, T3FREE, THYROIDAB in the last 72 hours. Anemia Panel: No results for input(s): VITAMINB12, FOLATE, FERRITIN, TIBC, IRON, RETICCTPCT in the last 72 hours. Sepsis Labs: No results for input(s): PROCALCITON, LATICACIDVEN in the last 168 hours.  Recent Results (from the past 240 hour(s))  Culture, respiratory     Status: None   Collection Time: 07/18/20  3:48 PM   Specimen: Tracheal Aspirate  Result Value Ref Range Status   Specimen Description TRACHEAL ASPIRATE  Final   Special Requests NONE  Final   Gram Stain   Final    ABUNDANT WBC PRESENT, PREDOMINANTLY PMN RARE GRAM NEGATIVE RODS Performed at [facility], [address], [place], [place] [address]    Culture FEW FLAVOBACTERIUM MENINGOSEPTICUM  Final   Report Status 07/22/2020 FINAL  Final   Organism ID, Bacteria FLAVOBACTERIUM MENINGOSEPTICUM  Final      Susceptibility   Flavobacterium meningosepticum - MIC*    CEFAZOLIN >=64 RESISTANT Resistant     CEFTAZIDIME >=64 RESISTANT Resistant     CIPROFLOXACIN 0.5 SENSITIVE Sensitive     GENTAMICIN >=16 RESISTANT Resistant     IMIPENEM >=16 RESISTANT Resistant     TRIMETH/SULFA 40 SENSITIVE Sensitive     PIP/TAZO >=128 RESISTANT Resistant     * FEW FLAVOBACTERIUM MENINGOSEPTICUM         Radiology Studies: DG Chest Port 1 View  Result  Date: 07/26/2020 CLINICAL DATA:  39 year old male with  respiratory failure. EXAM: PORTABLE CHEST 1 VIEW COMPARISON:  Chest radiograph dated 07/23/2020. FINDINGS: Tracheostomy above the carina. Right perihilar linear atelectasis. Developing infiltrate is not excluded. No pleural effusion pneumothorax. Stable cardiac silhouette. Atherosclerotic calcification of the aorta. IMPRESSION: Right perihilar linear atelectasis. Developing infiltrate is not excluded. Electronically Signed   By: Elgie Collard M.D.   On: 07/26/2020 21:12    Scheduled Meds: . chlorhexidine  15 mL Mouth Rinse BID  . Chlorhexidine Gluconate Cloth  6 each Topical Daily  . docusate  100 mg Per Tube BID  . enoxaparin (LOVENOX) injection  0.5 mg/kg Subcutaneous Q24H  . feeding supplement  237 mL Oral TID BM  . feeding supplement (PROSource TF)  45 mL Per Tube TID  . fentaNYL  1 patch Transdermal Q72H  . folic acid  1 mg Per Tube Daily  . [START ON 07/29/2020] free water  60 mL Per Tube Q24H  . gabapentin  600 mg Per Tube Q8H  . insulin aspart  0-15 Units Subcutaneous Q4H  . mouth rinse  15 mL Mouth Rinse q12n4p  . melatonin  5 mg Per Tube QHS  . multivitamin with minerals  1 tablet Per Tube Daily  . nystatin   Topical BID  . oxyCODONE  5 mg Per Tube Q4H  . pantoprazole sodium  40 mg Per Tube Daily  . polyethylene glycol  17 g Per Tube Daily  . polyvinyl alcohol  1 drop Both Eyes BID  . QUEtiapine  100 mg Per Tube BID  . scopolamine  1 patch Transdermal Q72H  . thiamine  100 mg Per Tube Daily  . valproic acid  500 mg Per Tube BID  . vitamin B-12  1,000 mcg Per Tube Daily   Continuous Infusions: . sodium chloride Stopped (07/27/20 0929)     LOS: 39 days    Time spent: 35 mins.    Cipriano Bunker, MD [facility]   If 7PM-7AM, please contact night-coverage

## 2020-07-28 NOTE — Progress Notes (Signed)
Patient has had a good night. He is A+Ox4, following commands, and is no longer on the ventilator (only using trach collar - 8L/min, 35% FiO2 the past two nights). He has not needed safety mitts or soft wrist restraints. He is also using a Passy Muir Speaking Valve on his trach to talk for brief periods and is able to state needs. He has been requesting that PT come back to work with him today as he wishes he can get out of bed. He received a one-time additional dose of oxycodone 5 mg this shift due to headache. His FlexiSeal was removed due to decreased output and discomfort. The patient spoke with his significant other, Quentin Mulling, and his father, Ace Gins, last night over the phone. All vitals are currently WDL and the patient is resting. Will continue to monitor.  Carmel Sacramento, RN

## 2020-07-28 NOTE — Evaluation (Signed)
Clinical/Bedside Swallow Evaluation Patient Details  Name: Nicholas Valdez MRN: [id number] Date of Birth: November 02, 1980  Today's Date: 07/28/2020 Time: SLP Start Time (ACUTE ONLY): 0815 SLP Stop Time (ACUTE ONLY): 0930 SLP Time Calculation (min) (ACUTE ONLY): 75 min  Past Medical History:  Past Medical History:  Diagnosis Date  . Alcohol abuse   . Anxiety   . Depression   . Drug abuse (HCC)   . GERD (gastroesophageal reflux disease)   . Hepatitis C   . Hypertension   . Pneumonia   . Pre-diabetes   . Sleep apnea    Past Surgical History:  Past Surgical History:  Procedure Laterality Date  . GASTROSTOMY W/ FEEDING TUBE Left 07/22/2020  . TRACHEOSTOMY TUBE PLACEMENT N/A 07/10/2020   Procedure: TRACHEOSTOMY;  Surgeon: Bud Face, MD;  Location: [facility] ORS;  Service: ENT;  Laterality: N/A;   HPI:  Nicholas Spence "[name]" is a 39 year old male with past medical history significant for EtOH and polysubstance abuse admitted with acute metabolic encephalopathy secondary to alcohol withdrawal and acute hypoxic respiratory failure due to encephalopathy and aspiration pneumonia.  Status post tracheostomy and PEG placement.  Pt was intubated at admit on 06/20/2020; trach placed 12/6; PEG placed 12/18. Pt weaned to trach collar O2 support 12/22.  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   Assessment / Plan / Recommendation Clinical Impression  Pt was seen for BSE assessment and toleration of PMV wear for verbal communication. Pt admitted for significant for EtOH and polysubstance abuse admitted with acute metabolic encephalopathy secondary to alcohol withdrawal and acute hypoxic respiratory failure. Pt received tracheostomy on 07/10/2020 s/p oral intubation since 06/20/2020. MD has noted decline in Cognitive functioning d/t reasons for admit. Pt has been on trach collar O2 support weaning from full vent support. He is awake, alert to follow through w/ tasks given  Min-Mod cues at times. Easily distracted. Pt's cuff is deflated at Baseline. He is eager to eat/drink.  Pt exhibited adequate vocalizations/verbalizations w/ PMV placed (cuff deflated at Baseline) redirecting airflow superiorly w/out discomfort. He exhibits min decreased volume of speech and a gravely vocal quality -- will ask MD to f/u w/ ENT for potential downsize of trach from a Shiley #8 to a Shiley #6 for better airflow around the trach for communication ease. He tolerated PMV placement for ~30 mins, then during BSE, w/out desat or discomfort reported or noted by this SLP(O2 sats 98%). He verbally communicated wants/needs w/ SLP and NSG in room.  During BSE w/ PMV placed, pt appears to present w/ adequate oropharyngeal phase swallow w/ No oropharyngeal phase dysphagia noted, No neuromuscular deficits noted. Pt consumed po trials w/ No overt, clinical s/s of aspiration during po trials. Pt appears at reduced risk for aspiration following general aspiration precautions. During po trials, pt consumed all consistencies w/ no immediate, overt coughing, decline in vocal quality, or change in respiratory presentation during/post trials. An intermittent dry, nonproductive cough x2-3 in between all trials intermittently - did not increase in frequency or intensity w/ po's. Oral phase appeared grossly [facility] w/ timely bolus management, mastication, and control of bolus propulsion for A-P transfer for swallowing. Oral clearing achieved w/ all trial consistencies w/ min more time and attention needed to masticate solids d/t missing Dentition; min increased respiratory effort for mastication and clearing but able to maintain good tolerance. OM Exam appeared [facility] w/ no unilateral weakness noted. Speech Clear. Pt fed self w/ setup support.  Recommend a Mech  Soft consistency diet for cut, moist foods sec. to missing/poor quality Dentition and declined Pulmonary status; Thin liquids VIA CUP. General aspiration precautions. Tray  setup and positioning at meals. Reduce distractions at meals.  Pills WHOLE in Puree for safer, easier swallowing as pt described Larger pills causing difficulty to swallow. Education given on Pills in Puree; food consistencies and easy to eat options; general aspiration precautions. NSG/MD updated, agreed SLP Visit Diagnosis: Dysphagia, unspecified (R13.10);Aphonia (R49.1) (sec. to tracheostomy - PMV wear/care)    Aspiration Risk  Mild aspiration risk;Risk for inadequate nutrition/hydration (reduced following precautions)    Diet Recommendation  Mech Soft consistency diet for cut, moist foods sec. to missing/poor quality Dentition and declined Pulmonary status; Thin liquids VIA CUP. General aspiration precautions. Tray setup and positioning at meals. Reduce distractions at meals.   Medication Administration: Whole meds with puree (for safer swallowing)    Other  Recommendations Recommended Consults: Consider ENT evaluation (Dietician f/u; ENT f/u for trach downsizing) Oral Care Recommendations: Oral care BID;Oral care before and after PO;Patient independent with oral care (support) Other Recommendations: Place PMSV during PO intake   Follow up Recommendations None (TBD)      Frequency and Duration min 1 x/week  1 week       Prognosis Prognosis for Safe Diet Advancement: Good Barriers to Reach Goals: Cognitive deficits;Time post onset;Severity of deficits;Behavior Barriers/Prognosis Comment: s/p lengthy illness and polysubstance abuse      Swallow Study   General Date of Onset: 06/20/20 HPI: Nicholas Figley "[name]" is a 39 year old male with past medical history significant for EtOH and polysubstance abuse admitted with acute metabolic encephalopathy secondary to alcohol withdrawal and acute hypoxic respiratory failure due to encephalopathy and aspiration pneumonia.  Status post tracheostomy and PEG placement.  Pt was intubated at admit on 06/20/2020; trach placed 12/6; PEG placed  12/18. Pt weaned to trach collar O2 support 12/22.  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 Type of Study: Bedside Swallow Evaluation Previous Swallow Assessment: none Diet Prior to this Study: NPO;PEG tube (placed 07/22/2020) Temperature Spikes Noted: No (wbc 9.5) Respiratory Status: Trach Collar (8L) History of Recent Intubation: Yes Length of Intubations (days): 21 days Date extubated: 07/10/20 Behavior/Cognition: Alert;Cooperative;Pleasant mood;Distractible;Requires cueing;Impulsive Oral Cavity Assessment: Within Functional Limits;Dry (slight) Oral Care Completed by SLP: Yes Oral Cavity - Dentition: Missing dentition;Poor condition Vision: Functional for self-feeding Self-Feeding Abilities: Able to feed self;Needs assist;Needs set up Patient Positioning: Upright in bed (needed positioning cues/support) Baseline Vocal Quality: Low vocal intensity (min gravely; PMV placed) Volitional Cough: Strong Volitional Swallow: Able to elicit    Oral/Motor/Sensory Function Overall Oral Motor/Sensory Function: Within functional limits   Ice Chips Ice chips: Within functional limits Presentation: Spoon (fed; 3 trials)   Thin Liquid Thin Liquid: Within functional limits Presentation: Cup;Self Fed (~10+ ozs) Other Comments: dry, nonproductive cough x2-3 in between trials intermittently - did not increase in frequency or intensity w/ po's    Nectar Thick Nectar Thick Liquid: Not tested   Honey Thick Honey Thick Liquid: Not tested   Puree Puree: Within functional limits Presentation: Self Fed;Spoon (4 ozs)   Solid     Solid: Within functional limits (grossly) Other Comments: needed min more time and attention to masticating solids d/t missing Dentition; min increased respiratory effort for masticaiton and clearing         .Jerilynn Som, MS, CCC-SLP Speech Language Pathologist Rehab Services [phone] Tasheba Henson 07/28/2020,10:21 AM

## 2020-07-28 NOTE — Progress Notes (Signed)
Physical Therapy Treatment Patient Details Name: RYSHAWN SANZONE MRN: [id number] DOB: 17-May-1981 Today's Date: 07/28/2020    History of Present Illness [name] is a 39 year old male with past medical history significant for EtOH and polysubstance abuse admitted with acute metabolic encephalopathy secondary to alcohol withdrawal and acute hypoxic respiratory failure due to encephalopathy and aspiration pneumonia.  Status post tracheostomy.    PT Comments    PT/OT co treat 2/2 to pt's complexity and requiring of +2 assistance. He is currently on trach collar with passy muir speaking valve placed this AM. Was able to converse throughout session however did present with cognition deficits. Very pleasant during session but limited by fatigue/ dizziness. He was able to progress from supine(HOB elevated) to short sit EOB with CGA only for safety. Did have one episode of vomiting however did not impact session progression. Stood one time EOB to RW with +2 assist for safety. Only able to tolerate standing ~ 20 sec before requesting to sit from being dizzy. Unwilling to trial standing a secound time. Did endorse fatigue. Highly recommend DC to CIR to improve safe functional abilities while assisting pt to PLOF.    Follow Up Recommendations  CIR     Equipment Recommendations  Other (comment) (on going assessment)    Recommendations for Other Services       Precautions / Restrictions Precautions Precautions: Fall Precaution Comments: trach collar/Passy muir Restrictions Weight Bearing Restrictions: No    Mobility  Bed Mobility Overal bed mobility: Needs Assistance Bed Mobility: Supine to Sit;Sit to Supine     Supine to sit: HOB elevated;Min guard Sit to supine: HOB elevated;Min guard   General bed mobility comments: CGA for safety moreso due to impulsivity  Transfers Overall transfer level: Needs assistance Equipment used: Rolling walker (2 wheeled) Transfers:  Sit to/from Stand Sit to Stand: +2 safety/equipment;Min assist;From elevated surface         General transfer comment: Pt stood 1 x ~ 20 sec before c/o dizziness and requiring seated rest. +2 for safety however demonstrates strength requirements to be able to perform  Ambulation/Gait    General Gait Details: unable due to dizziness       Balance Overall balance assessment: Needs assistance Sitting-balance support: Feet supported Sitting balance-Leahy Scale: Fair Sitting balance - Comments: Initial posterior lean noted, however improved from past session. Generally supervision for safety with seated tasks. Postural control: Posterior lean;Right lateral lean   Standing balance-Leahy Scale: Poor Standing balance comment: Requires +2 to maintain static standing balance. Pt with increased dizziness upon standing. Heavy reliance on RW.       Cognition Arousal/Alertness: Awake/alert Behavior During Therapy: Impulsive;WFL for tasks assessed/performed Overall Cognitive Status: Difficult to assess (assumed different from baseline cognition) Area of Impairment: Following commands;Safety/judgement;Rancho level               Rancho Levels of Cognitive Functioning [place]ant Scales of Cognitive Functioning: Confused/appropriate       Following Commands: Follows one step commands with increased time Safety/Judgement: Decreased awareness of safety;Decreased awareness of deficits     General Comments: Pt is alert and conversational however does have cognition deficits present. increased time required to process however did fairly well performing desired task requested      Exercises Other Exercises Other Exercises: OT/PT facilitate safe bed mobility, LB dressing while seated EOB, STS x2 (total attempts), with increased time to monitor vitals and safety cueing t/o session. Other Exercises: Pt education provided on importance of  continued functional activity during hospital  stay and role of therapy services in acute care vs. acute inpatient rehab.    General Comments        Pertinent Vitals/Pain Pain Assessment: No/denies pain Pain Score: 0-No pain Faces Pain Scale: Hurts little more Pain Location: BLE (quads and tops of feet) Pain Descriptors / Indicators: Sore;Tightness Pain Intervention(s): Limited activity within patient's tolerance;Monitored during session;Repositioned           PT Goals (current goals can now be found in the care plan section) Acute Rehab PT Goals Patient Stated Goal: "I want to get my strength back, so I could work out again if I wanted to" Progress towards PT goals: Progressing toward goals    Frequency    Min 2X/week      PT Plan Current plan remains appropriate    Co-evaluation   Reason for Co-Treatment: Complexity of the patient's impairments (multi-system involvement);Necessary to address cognition/behavior during functional activity;For patient/therapist safety;To address functional/ADL transfers;Other (comment) (will be +1 in future sessions) PT goals addressed during session: Balance;Mobility/safety with mobility;Strengthening/ROM;Proper use of DME        AM-PAC PT "6 Clicks" Mobility   Outcome Measure  Help needed turning from your back to your side while in a flat bed without using bedrails?: A Little Help needed moving from lying on your back to sitting on the side of a flat bed without using bedrails?: A Little Help needed moving to and from a bed to a chair (including a wheelchair)?: Total Help needed standing up from a chair using your arms (e.g., wheelchair or bedside chair)?: Total Help needed to walk in hospital room?: Total Help needed climbing 3-5 steps with a railing? : Total 6 Click Score: 10    End of Session Equipment Utilized During Treatment: Gait belt Activity Tolerance: Patient limited by fatigue Patient left: in bed;with call bell/phone within reach;with nursing/sitter in  room Nurse Communication: Mobility status PT Visit Diagnosis: Muscle weakness (generalized) (M62.81);Difficulty in walking, not elsewhere classified (R26.2);Unsteadiness on feet (R26.81)     Time: 1110-1150 PT Time Calculation (min) (ACUTE ONLY): 40 min  Charges:  $Therapeutic Activity: 8-22 mins                     Jetta Lout PTA 07/28/20, 1:54 PM

## 2020-07-28 NOTE — Progress Notes (Signed)
Occupational Therapy Treatment Patient Details Name: Nicholas Valdez MRN: [id number] DOB: 1981-04-19 Today's Date: 07/28/2020    History of present illness Nicholas Valdez "Nicholas Valdez" is a 39 year old male with past medical history significant for EtOH and polysubstance abuse admitted with acute metabolic encephalopathy secondary to alcohol withdrawal and acute hypoxic respiratory failure due to encephalopathy and aspiration pneumonia.  Status post tracheostomy.   OT comments  Pt seen for OT/PT co-tx this date. Units split according to protocols. Pt received semi supine in bed with passy-muir and trach collar in place. RN clears for participation in therapy session and states pt eager to attempt mobility. Pt greets therapist upon arrival and states "I'm horizontal, I want to get vertical". Pt demos improved bed mobility with marginally improved safety awareness and control this date, however, he endorses dizziness upon initial sup>sit, BP noted to be 138/100 once read, increased time required to assess BP as pt moves arms frequently during BP check. Pt remains fairly impulsive t/o session and requires consistent cueing for safety/sequencing with functional tasks. OT facilitates functional ADL tasks as described below. (See ADL section for additional detail regarding occupational performance). Pt eager to participate in functional mobility. He performs STS t/f attempts with +2 min/mod A and RW, however, upon coming to full stand he endorses 10/10 dizziness which does not resolve with seated rest break. Pt assisted with return to semi-supine in bed. RN updated on session. Pt making good progress toward goals and continues to benefit from skilled OT services to maximize return to PLOF and minimize risk of future falls, injury, caregiver burden, and readmission. Will continue to follow POC. Discharge recommendation for acute inpatient rehabilitation services remains most appropriate discharge.       Follow Up Recommendations  CIR    Equipment Recommendations  3 in 1 bedside commode    Recommendations for Other Services      Precautions / Restrictions Precautions Precautions: Fall Restrictions Weight Bearing Restrictions: No       Mobility Bed Mobility Overal bed mobility: Needs Assistance Bed Mobility: Supine to Sit;Sit to Supine     Supine to sit: HOB elevated;Min guard Sit to supine: HOB elevated;Min guard   General bed mobility comments: Pt demonstrates improved bed mobility requiring min guard for sup<>sit this date. MOD +2 for bed boost with HOB lowered.  Transfers Overall transfer level: Needs assistance   Transfers: Sit to/from Stand Sit to Stand: +2 physical assistance;+2 safety/equipment;Min assist;Mod assist         General transfer comment: Pt attempts STS x2 this date. He is initially unable to come to stand with bed in lowest position. He demos improved confidence/control with STS from elevated bed. He requires +2 to clear hips from EOB and stands briefly before c/o dizzines and requesting to sit back down.    Balance Overall balance assessment: Needs assistance Sitting-balance support: Feet supported Sitting balance-Leahy Scale: Fair Sitting balance - Comments: Initial posterior lean noted, however improved from past session. Generally supervision for safety with seated tasks. Postural control: Posterior lean;Right lateral lean   Standing balance-Leahy Scale: Poor Standing balance comment: Requires +2 to maintain static standing balance. Pt with increased dizziness upon standing. Heavy reliance on RW.                           ADL either performed or assessed with clinical judgement   ADL Overall ADL's : Needs assistance/impaired Eating/Feeding: Supervision/ safety;Set up Eating/Feeding Details (indicate cue type  and reason): Pt now has diet ordered per SLP, he performs drinking from open cup with supervision and min cueing for  safety.                 Lower Body Dressing: Sitting/lateral leans;Moderate assistance;Cueing for compensatory techniques;Cueing for sequencing;Cueing for safety Lower Body Dressing Details (indicate cue type and reason): Pt contines to be functionally limited by generalized weakness and decreased activity tolerance. He dons bilateral hospital socks with min guard for safety, however demonstrates limited tolerance for STS tasks. Continue to anticipate MOD A for STS LB dressing. Toilet Transfer: Moderate assistance;BSC;Stand-pivot;+2 for safety/equipment   Toileting- Clothing Manipulation and Hygiene: Sit to/from stand;Cueing for sequencing;Cueing for safety;Moderate assistance               Vision Baseline Vision/History: Wears glasses Wears Glasses: At all times Patient Visual Report: No change from baseline     Perception     Praxis      Cognition Arousal/Alertness: Awake/alert Behavior During Therapy: Impulsive;WFL for tasks assessed/performed   Area of Impairment: Rancho level;Safety/judgement;Following commands               Rancho Levels of Cognitive Functioning [place]igos Scales of Cognitive Functioning: Confused/appropriate       Following Commands: Follows one step commands with increased time Safety/Judgement: Decreased awareness of safety;Decreased awareness of deficits     General Comments: Pt remains Rancho 5/6 t/o session. He is A&Ox 4 however requires significant increased time to answer orientation questions. At times increased impulsivity noted with verbal outbursts/inappropriate comments. Pt is apologetic after outbursts and demonstrates improved awareness of deficits this date.        Exercises Other Exercises Other Exercises: OT/PT facilitate safe bed mobility, LB dressing while seated EOB, STS x2 (total attempts), with increased time to monitor vitals and safety cueing t/o session. Other Exercises: Pt education provided on  importance of continued functional activity during hospital stay and role of therapy services in acute care vs. acute inpatient rehab.   Shoulder Instructions       General Comments      Pertinent Vitals/ Pain       Faces Pain Scale: Hurts little more Pain Location: BLE (quads and tops of feet) Pain Descriptors / Indicators: Sore;Tightness Pain Intervention(s): Limited activity within patient's tolerance;Monitored during session;Repositioned  Home Living                                          Prior Functioning/Environment              Frequency  Min 3X/week        Progress Toward Goals  OT Goals(current goals can now be found in the care plan section)  Progress towards OT goals: Progressing toward goals  Acute Rehab OT Goals Patient Stated Goal: "I want to get my strength back, so I could work out again if I wanted to" OT Goal Formulation: With family Time For Goal Achievement: 08/10/20 Potential to Achieve Goals: Good  Plan Discharge plan remains appropriate;Frequency remains appropriate    Co-evaluation                 AM-PAC OT "6 Clicks" Daily Activity     Outcome Measure   Help from another person eating meals?: A Little Help from another person taking care of personal grooming?: A Little Help from another person toileting,  which includes using toliet, bedpan, or urinal?: A Lot Help from another person bathing (including washing, rinsing, drying)?: A Lot Help from another person to put on and taking off regular upper body clothing?: A Little Help from another person to put on and taking off regular lower body clothing?: A Lot 6 Click Score: 15    End of Session Equipment Utilized During Treatment: Gait belt;Rolling walker;Oxygen  OT Visit Diagnosis: Other abnormalities of gait and mobility (R26.89);Muscle weakness (generalized) (M62.81)   Activity Tolerance Patient tolerated treatment well   Patient Left in bed;with call  bell/phone within reach;with bed alarm set   Nurse Communication Mobility status        Time: 1110-1150 OT Time Calculation (min): 40 min  Charges: OT Treatments $Self Care/Home Management : 8-22 mins $Therapeutic Activity: 8-22 mins  Rockney Ghee, M.S., OTR/L Ascom: [phone] 07/28/20, 12:37 PM

## 2020-07-28 NOTE — Progress Notes (Signed)
Nutrition Follow-up  DOCUMENTATION CODES:   Morbid obesity  INTERVENTION:   Ensure Enlive po TID, each supplement provides 350 kcal and 20 grams of protein  Pro-Source 80m TID via tube, provides 40kcal and 11g of protein per serving   MVI daily   Free water flushes via G-tube- [id number] hours to maintain tube patency   NUTRITION DIAGNOSIS:   Inadequate oral intake related to inability to eat as evidenced by NPO status. -resolved  GOAL:   Patient will meet greater than or equal to 90% of their needs -progressing   MONITOR:   PO intake,Supplement acceptance,Labs,Weight trends,Skin,I & O's  ASSESSMENT:   [age]ear old male with PMHx of EtOH abuse, polysubstance abuse, hepatitis C admitted with AMS, acute encephalopathy, EtOH withdrawal, suspected aspiration PNA, AKI, sepsis.  Pt s/p tracheostomy tube placement 12/6 Pt s/p surgical placed G-tube 12/18  Pt able weaned from vent. Pt seen by SLP today and placed on a mechanical soft diet. Per RN report, pt ate 100% of his breakfast this morning. Met with pt at bedside. Pt reports that he is feeling better today. Pt reports that he feels hungry today. Pt is wanting to know when he can have his G-tube removed. Spoke with pt regarding the importance of adequate nutrition needed to preserve lean muscle and support recovery. Explained that tube will need to stay in place until we are sure he is able to eat enough to meet his needs. Pt is willing to drink vanilla Ensure in hospital. RD will continue some Prostat to help pt meet his estimated needs. Will hold on tube feeds for now. Per chart, pt down 18lbs since admit. Plan is for CIR at discharge.   Medications reviewed and include: colace, lovenox, folic acid, melatonin, insulin, MVI, oxycodone, protonix, miralax, thiamine, B12  Labs reviewed: K 3.4(L), BUN 24(H), P 4.9(H), Mg 2.1 wnl Hgb 9.8(L), Hct 29.9(L)  Diet Order:   Diet Order            DIET DYS 3 Room service appropriate?  Yes with Assist; Fluid consistency: Thin  Diet effective now                EDUCATION NEEDS:   No education needs have been identified at this time  Skin:  closed incision to neck, L heel wound   Last BM:  12/24- type 6  Height:   Ht Readings from Last 1 Encounters:  07/19/20 5' 10"  (1.778 m)   Weight:   Wt Readings from Last 1 Encounters:  07/28/20 (!) 137.2 kg   Ideal Body Weight:  75.5 kg  BMI:  Body mass index is 43.4 kg/m.  Estimated Nutritional Needs:   Kcal:  2700-3000kcal/day  Protein:  >135g/day  Fluid:  >/= 2.2 L/day  CaKoleen DistanceS, RD, LDN Please refer to [facility] and/or RD on-call/weekend/after hours pager

## 2020-07-28 NOTE — Progress Notes (Signed)
   Inpatient Rehabilitation Admissions Coordinator  I will place order for rehab consult per protocol.  Ottie Glazier, RN, MSN Rehab Admissions Coordinator [phone] 07/28/2020 3:47 PM

## 2020-07-29 DIAGNOSIS — F10231 Alcohol dependence with withdrawal delirium: Secondary | ICD-10-CM | POA: Diagnosis not present

## 2020-07-29 LAB — BASIC METABOLIC PANEL
Anion gap: 9 (ref 5–15)
BUN: 16 mg/dL (ref 6–20)
CO2: 27 mmol/L (ref 22–32)
Calcium: 9 mg/dL (ref 8.9–10.3)
Chloride: 100 mmol/L (ref 98–111)
Creatinine, Ser: 0.63 mg/dL (ref 0.61–1.24)
GFR, Estimated: >60 mL/min (ref >60)
Glucose, Bld: 110 mg/dL — ABNORMAL HIGH (ref 70–99)
Potassium: 3.8 mmol/L (ref 3.5–5.1)
Sodium: 136 mmol/L (ref 135–145)

## 2020-07-29 LAB — GLUCOSE, CAPILLARY
Glucose-Capillary: 103 mg/dL — ABNORMAL HIGH (ref 70–99)
Glucose-Capillary: 111 mg/dL — ABNORMAL HIGH (ref 70–99)
Glucose-Capillary: 118 mg/dL — ABNORMAL HIGH (ref 70–99)
Glucose-Capillary: 127 mg/dL — ABNORMAL HIGH (ref 70–99)
Glucose-Capillary: 128 mg/dL — ABNORMAL HIGH (ref 70–99)
Glucose-Capillary: 131 mg/dL — ABNORMAL HIGH (ref 70–99)
Glucose-Capillary: 142 mg/dL — ABNORMAL HIGH (ref 70–99)

## 2020-07-29 LAB — CBC
HCT: 30.6 % — ABNORMAL LOW (ref 39.0–52.0)
Hemoglobin: 10.5 g/dL — ABNORMAL LOW (ref 13.0–17.0)
MCH: 33.7 pg (ref 26.0–34.0)
MCHC: 34.3 g/dL (ref 30.0–36.0)
MCV: 98.1 fL (ref 80.0–100.0)
Platelets: 311 10*3/uL (ref 150–400)
RBC: 3.12 MIL/uL — ABNORMAL LOW (ref 4.22–5.81)
RDW: 14.2 % (ref 11.5–15.5)
WBC: 8.5 10*3/uL (ref 4.0–10.5)
nRBC: 0 % (ref 0.0–0.2)

## 2020-07-29 LAB — PHOSPHORUS: Phosphorus: 4.7 mg/dL — ABNORMAL HIGH (ref 2.5–4.6)

## 2020-07-29 LAB — MAGNESIUM: Magnesium: 2.1 mg/dL (ref 1.7–2.4)

## 2020-07-29 MED ORDER — LORAZEPAM 2 MG/ML IJ SOLN
2.0000 mg | INTRAMUSCULAR | Status: DC | PRN
  Administered 2020-07-30 – 2020-08-01 (×9): 2 mg via INTRAVENOUS
  Filled 2020-07-29 (×12): qty 1

## 2020-07-29 NOTE — Plan of Care (Signed)
Continuing with plan of care. 

## 2020-07-29 NOTE — Progress Notes (Signed)
PHARMACY CONSULT NOTE  Pharmacy Consult for Electrolyte Monitoring and Replacement   Recent Labs: Potassium (mmol/L)  Date Value  07/29/2020 3.8  02/12/2013 3.9   Magnesium (mg/dL)  Date Value  09/81/1914 2.1   Calcium (mg/dL)  Date Value  78/29/5621 9.0   Calcium, Total (mg/dL)  Date Value  30/86/5784 8.7   Albumin (g/dL)  Date Value  69/62/9528 3.2 (L)  02/12/2013 3.6   Phosphorus (mg/dL)  Date Value  41/32/4401 4.7 (H)   Sodium (mmol/L)  Date Value  07/29/2020 136  02/12/2013 139   Assessment: 39 year old male here with encephalopathy s/t alcohol withdrawal.  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 Pharmacy to manage electrolytes.  Goal of Therapy:  Electrolytes WNL  Plan:   No electrolyte replacement warranted today  Will continue to follow electrolytes   Nicholas Valdez 07/29/2020 7:14 AM

## 2020-07-29 NOTE — Progress Notes (Signed)
PROGRESS NOTE    Nicholas Valdez  PVX:[id number] DOB: May 19, 1981 DOA: 06/19/2020 PCP: Oswaldo Conroy, MD   Brief Narrative:  This 39 years old male with PMH significant for EtOH and polysubstance abuse admitted with acute metabolic encephalopathy secondary to alcohol withdrawal and acute hypoxic respiratory failure due to aspiration pneumonia and encephalopathy.  Patient is s/p tracheostomy. Patient has prolonged ICU course, continues to have withdrawal symptoms with agitation requiring Precedex drip. Patient is much improved, alert and oriented, participated in conversation. PCCM pickup 12/24. Patient has participated with physical therapy.  PT recommended CIR.  Assessment & Plan:   Principal Problem:   Alcohol withdrawal delirium (HCC) Active Problems:   Increased ammonia level   Acute metabolic encephalopathy   Elevated LFTs   Tobacco abuse   Elevated lactic acid level   Acute respiratory failure with hypoxia (HCC)   Pressure injury of skin   Acute hypoxic respiratory failure secondary to acute encephalopathy due to alcohol withdrawal. Patient was intubated on arrival,  admitted in the ICU and had a prolonged ICU course. Patient remained intubated, was tried to extubate several times but was not successful. Patient had tracheostomy tube placed by ENT. Patient has episodes of agitation,  requiring Precedex drip. Patient is stable, PCCM pickup 12/24. Patient also found to have pneumonia, tracheal aspirate grew ElizabethKingia species. Infectious disease consulted, continued on antibiotics. Patient has completed 7 days of treatment for ElizabethKingia in endotracheal culture with concern for nosocomial pneumonia Continue bronchodilators as ordered. Aspiration pneumonia: Completed antibiotic course. Speech and swallow evaluation completed, recommended dysphagia 3 diet. Patient has been tolerating diet,  more comfortable.  Acute Toxic Metabolic Encephalopathy: Continue  supportive care. Continue scheduled oxycodone and Valium, fentanyl patch,  Seroquel increased to 100 mg twice daily Psych following, appreciate input CT head with no acute abnormality.   Hepatitis C: Continue outpatient treatment  Chronic anemia without s/sx of bleeding: Hemoglobin remains stable 9.8. We will recheck CBC. Consider transfusion if hemoglobin drops below 7.  Hyperglycemia: Regular insulin sliding scale.   DVT prophylaxis: Lovenox Code Status: Full code Family Communication: No family at bedside Disposition Plan:  Status is: Inpatient  Remains inpatient appropriate because:Inpatient level of care appropriate due to severity of illness   Dispo: The patient is from: Home              Anticipated d/c is to: CIR              Anticipated d/c date is: > 3 days              Patient currently is not medically stable to d/c.    Consultants:   PCCM, ENT, psychiatry  Procedures: S/p tracheostomy, status post PEG tube. Antimicrobials:  Anti-infectives (From admission, onward)   Start     Dose/Rate Route Frequency Ordered Stop   07/21/20 1700  ciprofloxacin (CIPRO) IVPB 400 mg  Status:  Discontinued        400 mg 200 mL/hr over 60 Minutes Intravenous Every 12 hours 07/21/20 1543 07/27/20 1056   07/13/20 0800  minocycline (MINOCIN) capsule 100 mg       "Followed by" Linked Group Details   100 mg Per Tube 2 times daily 07/12/20 1305 07/18/20 1958   07/12/20 1400  minocycline (MINOCIN) capsule 200 mg       "Followed by" Linked Group Details   200 mg Per Tube  Once 07/12/20 1305 07/12/20 1447   07/06/20 2200  fluconazole (DIFLUCAN) tablet 100 mg  Status:  Discontinued        100 mg Per Tube Daily at bedtime 07/05/20 1525 07/12/20 0908   07/06/20 0400  anidulafungin (ERAXIS) 50 mg in sodium chloride 0.9 % 50 mL IVPB  Status:  Discontinued       "Followed by" Linked Group Details   50 mg 78 mL/hr over 50 Minutes Intravenous Every 24 hours 07/05/20 0313 07/05/20  1525   07/05/20 2200  fluconazole (DIFLUCAN) tablet 200 mg        200 mg Per Tube  Once 07/05/20 1525 07/05/20 2211   07/05/20 0400  anidulafungin (ERAXIS) 100 mg in sodium chloride 0.9 % 100 mL IVPB       "Followed by" Linked Group Details   100 mg 78 mL/hr over 100 Minutes Intravenous  Once 07/05/20 0313 07/05/20 0554   07/03/20 0500  Ampicillin-Sulbactam (UNASYN) 3 g in sodium chloride 0.9 % 100 mL IVPB  Status:  Discontinued        3 g 200 mL/hr over 30 Minutes Intravenous Every 6 hours 07/03/20 0349 07/07/20 1331   06/26/20 0000  vancomycin (VANCOREADY) IVPB 1750 mg/350 mL  Status:  Discontinued        1,750 mg 175 mL/hr over 120 Minutes Intravenous Every 24 hours 06/24/20 2330 06/27/20 1123   06/25/20 0015  vancomycin (VANCOREADY) IVPB 2000 mg/400 mL       "Followed by" Linked Group Details   2,000 mg 200 mL/hr over 120 Minutes Intravenous  Once 06/24/20 2325 06/25/20 0204   06/25/20 0015  vancomycin (VANCOREADY) IVPB 500 mg/100 mL       "Followed by" Linked Group Details   500 mg 100 mL/hr over 60 Minutes Intravenous  Once 06/24/20 2325 06/25/20 0105   06/21/20 1000  Ampicillin-Sulbactam (UNASYN) 3 g in sodium chloride 0.9 % 100 mL IVPB  Status:  Discontinued        3 g 200 mL/hr over 30 Minutes Intravenous Every 6 hours 06/21/20 0925 06/27/20 1123      Subjective: Patient was seen and examined at bedside.  Overnight events noted.  Patient has participated in full conversation.  Patient has a tracheostomy tube and PEG tube.  Objective: Vitals:   07/28/20 2347 07/29/20 0526 07/29/20 0600 07/29/20 1300  BP: 117/70 125/80  114/69  Pulse: 94 (!) 106  100  Resp: 20 20  16   Temp: 98.3 F (36.8 C) 98.1 F (36.7 C)  99.1 F (37.3 C)  TempSrc:    Oral  SpO2: 97% 98%  99%  Weight:   (!) 139.8 kg   Height:        Intake/Output Summary (Last 24 hours) at 07/29/2020 1417 Last data filed at 07/29/2020 1034 Gross per 24 hour  Intake 0 ml  Output 701 ml  Net -701 ml    Filed Weights   07/27/20 0356 07/28/20 0330 07/29/20 0600  Weight: (!) 136.4 kg (!) 137.2 kg (!) 139.8 kg    Examination:  General exam: Appears calm and comfortable , not in any distress, tracheostomy tube noted Respiratory system: Clear to auscultation. Respiratory effort normal. Cardiovascular system: S1 & S2 heard, RRR. No JVD, murmurs, rubs, gallops or clicks. No pedal edema. Gastrointestinal system: Abdomen is nondistended, soft and nontender. No organomegaly or masses felt.  PEG tube noted.  No signs of infection.  Normal bowel sounds heard. Central nervous system: Alert and oriented. No focal neurological deficits. Extremities: No edema, no cyanosis, no clubbing. Skin: No rashes, lesions or ulcers Psychiatry: Judgement and insight  appear normal. Mood & affect appropriate.     Data Reviewed: I have personally reviewed following labs and imaging studies  CBC: Recent Labs  Lab 07/25/20 0555 07/26/20 0403 07/27/20 0606 07/28/20 0423 07/29/20 0444  WBC 12.5* 11.0* 9.5 9.5 8.5  HGB 9.3* 9.6* 10.8* 9.8* 10.5*  HCT 27.3* 27.4* 32.2* 29.9* 30.6*  MCV 98.9 97.2 99.1 101.0* 98.1  PLT 342 315 325 325 311   Basic Metabolic Panel: Recent Labs  Lab 07/24/20 0510 07/25/20 0555 07/26/20 0403 07/27/20 0606 07/28/20 0423 07/29/20 0444  NA 139 139 135  --  137 136  K 3.3* 3.7 3.5  --  3.4* 3.8  CL 101 101 99  --  100 100  CO2 29 27 27   --  28 27  GLUCOSE 110* 115* 119*  --  113* 110*  BUN 16 14 14   --  24* 16  CREATININE 0.75 0.76 0.80  --  0.71 0.63  CALCIUM 8.8* 9.3 9.2  --  8.9 9.0  MG 1.7 2.1 2.0 2.1 2.1 2.1  PHOS 4.6 5.2* 4.3 5.0* 4.9* 4.7*   GFR: Estimated Creatinine Clearance: 178.3 mL/min (by C-G formula based on SCr of 0.63 mg/dL). Liver Function Tests: No results for input(s): AST, ALT, ALKPHOS, BILITOT, PROT, ALBUMIN in the last 168 hours. No results for input(s): LIPASE, AMYLASE in the last 168 hours. No results for input(s): AMMONIA in the last 168  hours. Coagulation Profile: No results for input(s): INR, PROTIME in the last 168 hours. Cardiac Enzymes: No results for input(s): CKTOTAL, CKMB, CKMBINDEX, TROPONINI in the last 168 hours. BNP (last 3 results) No results for input(s): PROBNP in the last 8760 hours. HbA1C: No results for input(s): HGBA1C in the last 72 hours. CBG: Recent Labs  Lab 07/28/20 1952 07/29/20 0016 07/29/20 0434 07/29/20 0815 07/29/20 1258  GLUCAP 118* 111* 103* 128* 142*   Lipid Profile: Recent Labs    07/27/20 0606  TRIG 164*   Thyroid Function Tests: No results for input(s): TSH, T4TOTAL, FREET4, T3FREE, THYROIDAB in the last 72 hours. Anemia Panel: No results for input(s): VITAMINB12, FOLATE, FERRITIN, TIBC, IRON, RETICCTPCT in the last 72 hours. Sepsis Labs: No results for input(s): PROCALCITON, LATICACIDVEN in the last 168 hours.  No results found for this or any previous visit (from the past 240 hour(s)).   Radiology Studies: No results found.  Scheduled Meds: . chlorhexidine  15 mL Mouth Rinse BID  . Chlorhexidine Gluconate Cloth  6 each Topical Daily  . docusate  100 mg Per Tube BID  . enoxaparin (LOVENOX) injection  0.5 mg/kg Subcutaneous Q24H  . feeding supplement  237 mL Oral TID BM  . feeding supplement (PROSource TF)  45 mL Per Tube TID  . fentaNYL  1 patch Transdermal Q72H  . folic acid  1 mg Per Tube Daily  . free water  60 mL Per Tube Q24H  . gabapentin  600 mg Per Tube Q8H  . insulin aspart  0-15 Units Subcutaneous Q4H  . mouth rinse  15 mL Mouth Rinse q12n4p  . melatonin  5 mg Per Tube QHS  . multivitamin with minerals  1 tablet Per Tube Daily  . nystatin   Topical BID  . oxyCODONE  5 mg Per Tube Q4H  . pantoprazole sodium  40 mg Per Tube Daily  . polyethylene glycol  17 g Per Tube Daily  . polyvinyl alcohol  1 drop Both Eyes BID  . QUEtiapine  100 mg Per Tube BID  .  scopolamine  1 patch Transdermal Q72H  . thiamine  100 mg Per Tube Daily  . valproic acid  500  mg Per Tube BID  . vitamin B-12  1,000 mcg Per Tube Daily   Continuous Infusions: . sodium chloride Stopped (07/27/20 0929)     LOS: 40 days    Time spent: 25 mins.    Cipriano Bunker, MD [facility]   If 7PM-7AM, please contact night-coverage

## 2020-07-30 DIAGNOSIS — F10231 Alcohol dependence with withdrawal delirium: Secondary | ICD-10-CM | POA: Diagnosis not present

## 2020-07-30 LAB — CBC
HCT: 28.3 % — ABNORMAL LOW (ref 39.0–52.0)
Hemoglobin: 9.4 g/dL — ABNORMAL LOW (ref 13.0–17.0)
MCH: 32.6 pg (ref 26.0–34.0)
MCHC: 33.2 g/dL (ref 30.0–36.0)
MCV: 98.3 fL (ref 80.0–100.0)
Platelets: 313 10*3/uL (ref 150–400)
RBC: 2.88 MIL/uL — ABNORMAL LOW (ref 4.22–5.81)
RDW: 14 % (ref 11.5–15.5)
WBC: 8.5 10*3/uL (ref 4.0–10.5)
nRBC: 0 % (ref 0.0–0.2)

## 2020-07-30 LAB — GLUCOSE, CAPILLARY
Glucose-Capillary: 104 mg/dL — ABNORMAL HIGH (ref 70–99)
Glucose-Capillary: 115 mg/dL — ABNORMAL HIGH (ref 70–99)
Glucose-Capillary: 116 mg/dL — ABNORMAL HIGH (ref 70–99)
Glucose-Capillary: 120 mg/dL — ABNORMAL HIGH (ref 70–99)
Glucose-Capillary: 124 mg/dL — ABNORMAL HIGH (ref 70–99)
Glucose-Capillary: 137 mg/dL — ABNORMAL HIGH (ref 70–99)

## 2020-07-30 LAB — MAGNESIUM: Magnesium: 2.1 mg/dL (ref 1.7–2.4)

## 2020-07-30 LAB — BASIC METABOLIC PANEL
Anion gap: 9 (ref 5–15)
BUN: 12 mg/dL (ref 6–20)
CO2: 27 mmol/L (ref 22–32)
Calcium: 9.3 mg/dL (ref 8.9–10.3)
Chloride: 97 mmol/L — ABNORMAL LOW (ref 98–111)
Creatinine, Ser: 0.65 mg/dL (ref 0.61–1.24)
GFR, Estimated: >60 mL/min (ref >60)
Glucose, Bld: 122 mg/dL — ABNORMAL HIGH (ref 70–99)
Potassium: 3.7 mmol/L (ref 3.5–5.1)
Sodium: 133 mmol/L — ABNORMAL LOW (ref 135–145)

## 2020-07-30 LAB — PHOSPHORUS: Phosphorus: 4.8 mg/dL — ABNORMAL HIGH (ref 2.5–4.6)

## 2020-07-30 MED ORDER — THIAMINE HCL 100 MG PO TABS
100.0000 mg | ORAL_TABLET | Freq: Every day | ORAL | Status: DC
  Administered 2020-07-31 – 2020-08-04 (×5): 100 mg via ORAL
  Filled 2020-07-30 (×5): qty 1

## 2020-07-30 MED ORDER — HALOPERIDOL 1 MG PO TABS
1.0000 mg | ORAL_TABLET | Freq: Once | ORAL | Status: DC
  Filled 2020-07-30: qty 1

## 2020-07-30 MED ORDER — MELATONIN 5 MG PO TABS
5.0000 mg | ORAL_TABLET | Freq: Every day | ORAL | Status: DC
  Administered 2020-07-30 – 2020-08-03 (×5): 5 mg via ORAL
  Filled 2020-07-30 (×5): qty 1

## 2020-07-30 MED ORDER — ACETAMINOPHEN 325 MG PO TABS
650.0000 mg | ORAL_TABLET | Freq: Four times a day (QID) | ORAL | Status: DC | PRN
  Administered 2020-08-04: 650 mg via ORAL
  Filled 2020-07-30: qty 2

## 2020-07-30 MED ORDER — QUETIAPINE FUMARATE 100 MG PO TABS
100.0000 mg | ORAL_TABLET | Freq: Two times a day (BID) | ORAL | Status: DC
  Administered 2020-07-30 – 2020-08-04 (×9): 100 mg via ORAL
  Filled 2020-07-30 (×9): qty 1

## 2020-07-30 MED ORDER — OLANZAPINE 5 MG PO TABS
5.0000 mg | ORAL_TABLET | Freq: Four times a day (QID) | ORAL | Status: DC | PRN
  Administered 2020-07-30 – 2020-08-03 (×6): 5 mg via ORAL
  Filled 2020-07-30 (×7): qty 1

## 2020-07-30 MED ORDER — ADULT MULTIVITAMIN W/MINERALS CH
1.0000 | ORAL_TABLET | Freq: Every day | ORAL | Status: DC
  Administered 2020-07-31: 1 via ORAL
  Filled 2020-07-30: qty 1

## 2020-07-30 MED ORDER — HALOPERIDOL 1 MG PO TABS
1.0000 mg | ORAL_TABLET | Freq: Four times a day (QID) | ORAL | Status: DC | PRN
  Administered 2020-07-31 – 2020-08-02 (×3): 1 mg via ORAL
  Filled 2020-07-30 (×4): qty 1

## 2020-07-30 MED ORDER — FOLIC ACID 1 MG PO TABS
1.0000 mg | ORAL_TABLET | Freq: Every day | ORAL | Status: DC
  Administered 2020-07-31 – 2020-08-04 (×5): 1 mg via ORAL
  Filled 2020-07-30 (×5): qty 1

## 2020-07-30 MED ORDER — DOCUSATE SODIUM 50 MG/5ML PO LIQD
100.0000 mg | Freq: Two times a day (BID) | ORAL | Status: DC
  Administered 2020-07-30 – 2020-08-04 (×10): 100 mg via ORAL
  Filled 2020-07-30 (×11): qty 10

## 2020-07-30 MED ORDER — HALOPERIDOL LACTATE 5 MG/ML IJ SOLN
1.0000 mg | Freq: Four times a day (QID) | INTRAMUSCULAR | Status: DC | PRN
  Administered 2020-07-30 – 2020-07-31 (×2): 1 mg via INTRAMUSCULAR
  Filled 2020-07-30 (×2): qty 1

## 2020-07-30 MED ORDER — POLYETHYLENE GLYCOL 3350 17 G PO PACK
17.0000 g | PACK | Freq: Every day | ORAL | Status: DC
  Administered 2020-07-31 – 2020-08-03 (×4): 17 g via ORAL
  Filled 2020-07-30 (×3): qty 1

## 2020-07-30 MED ORDER — ACETAMINOPHEN 650 MG RE SUPP: 650.0000 mg | Freq: Four times a day (QID) | RECTAL | Status: DC | PRN

## 2020-07-30 MED ORDER — HALOPERIDOL 1 MG PO TABS
1.0000 mg | ORAL_TABLET | Freq: Four times a day (QID) | ORAL | Status: DC | PRN
  Filled 2020-07-30 (×2): qty 1

## 2020-07-30 MED ORDER — GABAPENTIN 250 MG/5ML PO SOLN
600.0000 mg | Freq: Three times a day (TID) | ORAL | Status: DC
Start: 2020-07-30 — End: 2020-08-02
  Administered 2020-07-31 – 2020-08-02 (×7): 600 mg via ORAL
  Filled 2020-07-30 (×13): qty 12

## 2020-07-30 MED ORDER — OXYCODONE HCL 5 MG PO TABS
5.0000 mg | ORAL_TABLET | ORAL | Status: DC
  Administered 2020-07-30 – 2020-08-02 (×15): 5 mg via ORAL
  Filled 2020-07-30 (×15): qty 1

## 2020-07-30 MED ORDER — VITAMIN B-12 1000 MCG PO TABS
1000.0000 ug | ORAL_TABLET | Freq: Every day | ORAL | Status: DC
  Administered 2020-07-31 – 2020-08-04 (×5): 1000 ug via ORAL
  Filled 2020-07-30 (×5): qty 1

## 2020-07-30 MED ORDER — VALPROIC ACID 250 MG/5ML PO SOLN
500.0000 mg | Freq: Two times a day (BID) | ORAL | Status: DC
  Administered 2020-07-30 – 2020-08-02 (×6): 500 mg via ORAL
  Filled 2020-07-30 (×7): qty 10

## 2020-07-30 MED ORDER — PANTOPRAZOLE SODIUM 40 MG PO PACK
40.0000 mg | PACK | Freq: Every day | ORAL | Status: DC
  Administered 2020-07-31 – 2020-08-04 (×3): 40 mg via ORAL
  Filled 2020-07-30 (×9): qty 20

## 2020-07-30 NOTE — Progress Notes (Signed)
PROGRESS NOTE    Nicholas Valdez  [id number]:[id number] DOB: [birth year] DOA: 06/19/2020 PCP: Oswaldo Conroy, MD   Brief Narrative:  This 39 years old male with PMH significant for EtOH and polysubstance abuse admitted with acute metabolic encephalopathy secondary to alcohol withdrawal and acute hypoxic respiratory failure due to aspiration pneumonia and encephalopathy.  Patient is s/p tracheostomy. Patient has prolonged ICU course, continues to have withdrawal symptoms with agitation requiring Precedex drip. Patient is much improved, alert and oriented x 2 , participated in conversation. PCCM pickup 12/24. Patient has participated with physical therapy.  PT recommended CIR.  Assessment & Plan:   Principal Problem:   Alcohol withdrawal delirium (HCC) Active Problems:   Increased ammonia level   Acute metabolic encephalopathy   Elevated LFTs   Tobacco abuse   Elevated lactic acid level   Acute respiratory failure with hypoxia (HCC)   Pressure injury of skin   Acute hypoxic respiratory failure secondary to acute encephalopathy due to alcohol withdrawal. Patient was intubated on arrival,  admitted in the ICU and had a prolonged ICU course. Patient remained intubated, was tried to extubate several times but was not successful. Patient had tracheostomy tube placed by ENT. Patient has episodes of agitation,  requiring Precedex drip. Patient is stable, PCCM pickup 12/24. Patient also found to have pneumonia, tracheal aspirate grew ElizabethKingia species. Infectious disease consulted, continued on antibiotics. Patient has completed 7 days of treatment for ElizabethKingia in endotracheal culture with concern for nosocomial pneumonia Continue bronchodilators as ordered. Aspiration pneumonia: Completed antibiotic course. Speech and swallow evaluation completed, recommended dysphagia 3 diet. Patient has been tolerating diet,  more comfortable.  Acute Toxic Metabolic  Encephalopathy: Continue supportive care. Continue scheduled oxycodone and Valium, fentanyl patch,  Seroquel increased to 100 mg twice daily Psych following, appreciate input CT head with no acute abnormality.   Hepatitis C: Continue outpatient treatment  Chronic anemia without s/sx of bleeding: Hemoglobin remains stable 9.8. We will recheck CBC. Consider transfusion if hemoglobin drops below 7.  Hyperglycemia: Regular insulin sliding scale.   DVT prophylaxis: Lovenox Code Status: Full code. Family Communication: No family at bedside. Disposition Plan:  Status is: Inpatient  Remains inpatient appropriate because:Inpatient level of care appropriate due to severity of illness   Dispo: The patient is from: Home              Anticipated d/c is to: CIR              Anticipated d/c date is: > 3 days              Patient currently is not medically stable to d/c.    Consultants:   PCCM, ENT, Psychiatry  Procedures: S/p tracheostomy, status post PEG tube. Antimicrobials:  Anti-infectives (From admission, onward)   Start     Dose/Rate Route Frequency Ordered Stop   07/21/20 1700  ciprofloxacin (CIPRO) IVPB 400 mg  Status:  Discontinued        400 mg 200 mL/hr over 60 Minutes Intravenous Every 12 hours 07/21/20 1543 07/27/20 1056   07/13/20 0800  minocycline (MINOCIN) capsule 100 mg       "Followed by" Linked Group Details   100 mg Per Tube 2 times daily 07/12/20 1305 07/18/20 1958   07/12/20 1400  minocycline (MINOCIN) capsule 200 mg       "Followed by" Linked Group Details   200 mg Per Tube  Once 07/12/20 1305 07/12/20 1447   07/06/20 2200  fluconazole (DIFLUCAN) tablet 100  mg  Status:  Discontinued        100 mg Per Tube Daily at bedtime 07/05/20 1525 07/12/20 0908   07/06/20 0400  anidulafungin (ERAXIS) 50 mg in sodium chloride 0.9 % 50 mL IVPB  Status:  Discontinued       "Followed by" Linked Group Details   50 mg 78 mL/hr over 50 Minutes Intravenous Every 24  hours 07/05/20 0313 07/05/20 1525   07/05/20 2200  fluconazole (DIFLUCAN) tablet 200 mg        200 mg Per Tube  Once 07/05/20 1525 07/05/20 2211   07/05/20 0400  anidulafungin (ERAXIS) 100 mg in sodium chloride 0.9 % 100 mL IVPB       "Followed by" Linked Group Details   100 mg 78 mL/hr over 100 Minutes Intravenous  Once 07/05/20 0313 07/05/20 0554   07/03/20 0500  Ampicillin-Sulbactam (UNASYN) 3 g in sodium chloride 0.9 % 100 mL IVPB  Status:  Discontinued        3 g 200 mL/hr over 30 Minutes Intravenous Every 6 hours 07/03/20 0349 07/07/20 1331   06/26/20 0000  vancomycin (VANCOREADY) IVPB 1750 mg/350 mL  Status:  Discontinued        1,750 mg 175 mL/hr over 120 Minutes Intravenous Every 24 hours 06/24/20 2330 06/27/20 1123   06/25/20 0015  vancomycin (VANCOREADY) IVPB 2000 mg/400 mL       "Followed by" Linked Group Details   2,000 mg 200 mL/hr over 120 Minutes Intravenous  Once 06/24/20 2325 06/25/20 0204   06/25/20 0015  vancomycin (VANCOREADY) IVPB 500 mg/100 mL       "Followed by" Linked Group Details   500 mg 100 mL/hr over 60 Minutes Intravenous  Once 06/24/20 2325 06/25/20 0105   06/21/20 1000  Ampicillin-Sulbactam (UNASYN) 3 g in sodium chloride 0.9 % 100 mL IVPB  Status:  Discontinued        3 g 200 mL/hr over 30 Minutes Intravenous Every 6 hours 06/21/20 0925 06/27/20 1123      Subjective: Patient was seen and examined at bedside.  Overnight events noted.  Patient has participated in full conversation.   Patient wants to be discharged.  Patient has tracheostomy tube and PEG tube.  Objective: Vitals:   07/29/20 2045 07/30/20 0029 07/30/20 0423 07/30/20 1100  BP: 115/84 108/81 114/68 134/69  Pulse: (!) 110 (!) 109 (!) 104 (!) 105  Resp: 20 20 20 16   Temp: 98.8 F (37.1 C) 99.3 F (37.4 C) 98.4 F (36.9 C) 98.1 F (36.7 C)  TempSrc: Oral Oral Oral Oral  SpO2: 98% 99% 100% 100%  Weight:      Height:        Intake/Output Summary (Last 24 hours) at 07/30/2020  1305 Last data filed at 07/30/2020 1034 Gross per 24 hour  Intake 120 ml  Output --  Net 120 ml   Filed Weights   07/27/20 0356 07/28/20 0330 07/29/20 0600  Weight: (!) 136.4 kg (!) 137.2 kg (!) 139.8 kg    Examination:  General exam: Appears calm and comfortable , not in any distress, tracheostomy tube noted Respiratory system: Clear to auscultation. Respiratory effort normal. Cardiovascular system: S1 & S2 heard, RRR. No JVD, murmurs, rubs, gallops or clicks. No pedal edema. Gastrointestinal system: Abdomen is nondistended, soft and nontender. No organomegaly or masses felt.  PEG tube noted.  No signs of infection.  Normal bowel sounds heard. Central nervous system: Alert and oriented. No focal neurological deficits. Extremities: No edema, no  cyanosis, no clubbing. Skin: No rashes, lesions or ulcers Psychiatry: Judgement and insight appear normal. Mood & affect appropriate.     Data Reviewed: I have personally reviewed following labs and imaging studies  CBC: Recent Labs  Lab 07/26/20 0403 07/27/20 0606 07/28/20 0423 07/29/20 0444 07/30/20 0557  WBC 11.0* 9.5 9.5 8.5 8.5  HGB 9.6* 10.8* 9.8* 10.5* 9.4*  HCT 27.4* 32.2* 29.9* 30.6* 28.3*  MCV 97.2 99.1 101.0* 98.1 98.3  PLT 315 325 325 311 313   Basic Metabolic Panel: Recent Labs  Lab 07/25/20 0555 07/26/20 0403 07/27/20 0606 07/28/20 0423 07/29/20 0444 07/30/20 0557  NA 139 135  --  137 136 133*  K 3.7 3.5  --  3.4* 3.8 3.7  CL 101 99  --  100 100 97*  CO2 27 27  --  28 27 27   GLUCOSE 115* 119*  --  113* 110* 122*  BUN 14 14  --  24* 16 12  CREATININE 0.76 0.80  --  0.71 0.63 0.65  CALCIUM 9.3 9.2  --  8.9 9.0 9.3  MG 2.1 2.0 2.1 2.1 2.1 2.1  PHOS 5.2* 4.3 5.0* 4.9* 4.7* 4.8*   GFR: Estimated Creatinine Clearance: 178.3 mL/min (by C-G formula based on SCr of 0.65 mg/dL). Liver Function Tests: No results for input(s): AST, ALT, ALKPHOS, BILITOT, PROT, ALBUMIN in the last 168 hours. No results for  input(s): LIPASE, AMYLASE in the last 168 hours. No results for input(s): AMMONIA in the last 168 hours. Coagulation Profile: No results for input(s): INR, PROTIME in the last 168 hours. Cardiac Enzymes: No results for input(s): CKTOTAL, CKMB, CKMBINDEX, TROPONINI in the last 168 hours. BNP (last 3 results) No results for input(s): PROBNP in the last 8760 hours. HbA1C: No results for input(s): HGBA1C in the last 72 hours. CBG: Recent Labs  Lab 07/29/20 2339 07/30/20 0020 07/30/20 0050 07/30/20 0426 07/30/20 1116  GLUCAP 131* 115* 124* 120* 116*   Lipid Profile: No results for input(s): CHOL, HDL, LDLCALC, TRIG, CHOLHDL, LDLDIRECT in the last 72 hours. Thyroid Function Tests: No results for input(s): TSH, T4TOTAL, FREET4, T3FREE, THYROIDAB in the last 72 hours. Anemia Panel: No results for input(s): VITAMINB12, FOLATE, FERRITIN, TIBC, IRON, RETICCTPCT in the last 72 hours. Sepsis Labs: No results for input(s): PROCALCITON, LATICACIDVEN in the last 168 hours.  No results found for this or any previous visit (from the past 240 hour(s)).   Radiology Studies: No results found.  Scheduled Meds: . chlorhexidine  15 mL Mouth Rinse BID  . Chlorhexidine Gluconate Cloth  6 each Topical Daily  . docusate  100 mg Per Tube BID  . enoxaparin (LOVENOX) injection  0.5 mg/kg Subcutaneous Q24H  . feeding supplement  237 mL Oral TID BM  . feeding supplement (PROSource TF)  45 mL Per Tube TID  . fentaNYL  1 patch Transdermal Q72H  . folic acid  1 mg Per Tube Daily  . free water  60 mL Per Tube Q24H  . gabapentin  600 mg Per Tube Q8H  . insulin aspart  0-15 Units Subcutaneous Q4H  . mouth rinse  15 mL Mouth Rinse q12n4p  . melatonin  5 mg Per Tube QHS  . multivitamin with minerals  1 tablet Per Tube Daily  . nystatin   Topical BID  . oxyCODONE  5 mg Per Tube Q4H  . pantoprazole sodium  40 mg Per Tube Daily  . polyethylene glycol  17 g Per Tube Daily  . polyvinyl alcohol  1 drop Both  Eyes BID  . QUEtiapine  100 mg Per Tube BID  . scopolamine  1 patch Transdermal Q72H  . thiamine  100 mg Per Tube Daily  . valproic acid  500 mg Per Tube BID  . vitamin B-12  1,000 mcg Per Tube Daily   Continuous Infusions: . sodium chloride Stopped (07/27/20 0929)     LOS: 41 days    Time spent: 25 mins.    Cipriano Bunker, MD [facility]   If 7PM-7AM, please contact night-coverage

## 2020-07-30 NOTE — Progress Notes (Signed)
CH visited pt. per RN suggestion earlier this shift and as follow-up from visit on ICU earlier this week.  Pt lying in bed, somewhat drowsy, able to speak through valve of trach collar.  Pt. seemed slightly confused or perhaps drowsy from medication, but was able to carry on conversation.  Pt. closed his eyes to sleep midway through visit, so CH excused himself to allow pt. to rest.  CH remains available as needed.

## 2020-07-30 NOTE — Progress Notes (Signed)
°   07/30/20 1644  Assess: MEWS Score  Temp 98.5 F (36.9 C)  BP (!) 123/94  Pulse Rate (!) 127  Resp 20  Level of Consciousness Alert  SpO2 98 %  O2 Device Room Air  Patient Activity (if Appropriate) In bed  Assess: if the MEWS score is Yellow or Red  Were vital signs taken at a resting state? Yes  Focused Assessment Change from prior assessment (see assessment flowsheet)  Early Detection of Sepsis Score *See Row Information* Low  MEWS guidelines implemented *See Row Information* Yes  Treat  MEWS Interventions Administered scheduled meds/treatments;Administered prn meds/treatments  Pain Scale 0-10  Pain Score 0  Faces Pain Scale 0  Take Vital Signs  Increase Vital Sign Frequency  Yellow: Q 2hr X 2 then Q 4hr X 2, if remains yellow, continue Q 4hrs  Escalate  MEWS: Escalate Yellow: discuss with charge nurse/RN and consider discussing with provider and RRT  Notify: Charge Nurse/RN  Name of Charge Nurse/RN Notified Stefano Gaul, RN  Date Charge Nurse/RN Notified 07/30/20  Time Charge Nurse/RN Notified 1648  Notify: Provider  Provider Name/Title Dr. Lucianne Muss  Date Provider Notified 07/30/20  Time Provider Notified 1648  Notification Type Page  Notification Reason Change in status  Response See new orders  Date of Provider Response 07/30/20  Time of Provider Response [phone]

## 2020-07-30 NOTE — Progress Notes (Signed)
PHARMACY CONSULT NOTE  Pharmacy Consult for Electrolyte Monitoring and Replacement   Recent Labs: Potassium (mmol/L)  Date Value  07/30/2020 3.7  02/12/2013 3.9   Magnesium (mg/dL)  Date Value  33/29/5188 2.1   Calcium (mg/dL)  Date Value  41/66/0630 9.3   Calcium, Total (mg/dL)  Date Value  16/08/930 8.7   Albumin (g/dL)  Date Value  35/57/3220 3.2 (L)  02/12/2013 3.6   Phosphorus (mg/dL)  Date Value  25/42/7062 4.8 (H)   Sodium (mmol/L)  Date Value  07/30/2020 133 (L)  02/12/2013 139   Assessment: 39 year old male here with encephalopathy s/t alcohol withdrawal.  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 Pharmacy to manage electrolytes.  Goal of Therapy:  Electrolytes WNL  Plan:   No electrolyte replacement warranted today  Will continue to follow electrolytes   Lowella Bandy 07/30/2020 6:58 AM

## 2020-07-30 NOTE — Progress Notes (Addendum)
Patient remains confused, agitated but not combative. He is pulling out medical equipments including multiple IVs in the past few days. Unable to re-orient or redirect at this time despite being on multiple psychotropics and sedatives. He has a PEG tube and Trach. Will attempt another IV, however if unsuccessful will leave IV out in the mean time. In the event that urgent need for IV access is required, IV team will be consulted and have agreed to place IV STAT. He currently does not meet criteria for mechanical restrainst. We will attempt to secure IV site and use mittens if appropriate.     Webb Silversmith, BSN, MSN, DNP, CCRN,FNP-C, AGACNP-BC [facility] Nurse Practitioner   [facility]

## 2020-07-30 NOTE — Plan of Care (Signed)
Continuing with plan of care. 

## 2020-07-30 NOTE — Progress Notes (Signed)
Consult placed for IV start. Pt has had multiple IVs that he has removed including an IV placed earlier today by IV Team. He has no continuous ivfs ordered. Bedside RN to notify MD and to get an order to leave IV out at this time. IV Team will remain available as needed.

## 2020-07-30 NOTE — Progress Notes (Signed)
A consult was placed to the IV Therapist for a new iv site;  Pt receiving IV ativan and Dilaudid PRN;  Upon entering the pt's room, there was food and toilet paper all over the floor;  pt had a suture removal kit "to trim his nails" that was taken away from him by the NT3;  Pt was very inappropriate, exposing himself twice, and using foul language;  IV was restarted on the 2nd attempt as the catheters would not thread due to scar tissue;  RN made aware of new IV access.

## 2020-07-31 DIAGNOSIS — F10231 Alcohol dependence with withdrawal delirium: Secondary | ICD-10-CM | POA: Diagnosis not present

## 2020-07-31 LAB — CBC
HCT: 27.4 % — ABNORMAL LOW (ref 39.0–52.0)
Hemoglobin: 9.4 g/dL — ABNORMAL LOW (ref 13.0–17.0)
MCH: 33.8 pg (ref 26.0–34.0)
MCHC: 34.3 g/dL (ref 30.0–36.0)
MCV: 98.6 fL (ref 80.0–100.0)
Platelets: 311 10*3/uL (ref 150–400)
RBC: 2.78 MIL/uL — ABNORMAL LOW (ref 4.22–5.81)
RDW: 14 % (ref 11.5–15.5)
WBC: 7.5 10*3/uL (ref 4.0–10.5)
nRBC: 0 % (ref 0.0–0.2)

## 2020-07-31 LAB — GLUCOSE, CAPILLARY
Glucose-Capillary: 118 mg/dL — ABNORMAL HIGH (ref 70–99)
Glucose-Capillary: 103 mg/dL — ABNORMAL HIGH (ref 70–99)
Glucose-Capillary: 118 mg/dL — ABNORMAL HIGH (ref 70–99)
Glucose-Capillary: 112 mg/dL — ABNORMAL HIGH (ref 70–99)
Glucose-Capillary: 125 mg/dL — ABNORMAL HIGH (ref 70–99)
Glucose-Capillary: 114 mg/dL — ABNORMAL HIGH (ref 70–99)

## 2020-07-31 LAB — COMPREHENSIVE METABOLIC PANEL
ALT: 24 U/L (ref 0–44)
AST: 23 U/L (ref 15–41)
Albumin: 3.2 g/dL — ABNORMAL LOW (ref 3.5–5.0)
Alkaline Phosphatase: 47 U/L (ref 38–126)
Anion gap: 8 (ref 5–15)
BUN: 10 mg/dL (ref 6–20)
CO2: 28 mmol/L (ref 22–32)
Calcium: 8.9 mg/dL (ref 8.9–10.3)
Chloride: 98 mmol/L (ref 98–111)
Creatinine, Ser: 0.67 mg/dL (ref 0.61–1.24)
GFR, Estimated: >60 mL/min (ref >60)
Glucose, Bld: 121 mg/dL — ABNORMAL HIGH (ref 70–99)
Potassium: 3.2 mmol/L — ABNORMAL LOW (ref 3.5–5.1)
Sodium: 134 mmol/L — ABNORMAL LOW (ref 135–145)
Total Bilirubin: 0.3 mg/dL (ref 0.3–1.2)
Total Protein: 6.7 g/dL (ref 6.5–8.1)

## 2020-07-31 LAB — PHOSPHORUS: Phosphorus: 5 mg/dL — ABNORMAL HIGH (ref 2.5–4.6)

## 2020-07-31 LAB — MAGNESIUM: Magnesium: 2 mg/dL (ref 1.7–2.4)

## 2020-07-31 MED ORDER — POTASSIUM CHLORIDE 20 MEQ PO PACK
40.0000 meq | PACK | Freq: Once | ORAL | Status: AC
  Administered 2020-07-31: 40 meq
  Filled 2020-07-31: qty 2

## 2020-07-31 MED ORDER — POTASSIUM CHLORIDE 20 MEQ PO PACK: 40.0000 meq | PACK | Freq: Once | ORAL | Status: DC

## 2020-07-31 NOTE — Progress Notes (Addendum)
Speech Language Pathology Treatment: Dysphagia  Patient Details Name: Nicholas Valdez MRN: [id number] DOB: Apr 15, 1981 Today's Date: 07/31/2020 Time: 1330-1430 SLP Time Calculation (min) (ACUTE ONLY): 60 min  Assessment / Plan / Recommendation Clinical Impression  Pt was seen for f/u assessment and toleration of PMV wear for verbal communication and toleration of oral diet(mech soft w/ thin liquids). Pt admitted for significant for EtOH and polysubstance abuse admitted with acute metabolic encephalopathy secondary to alcohol withdrawal and acute hypoxic respiratory failure. Pt received tracheostomy on 07/10/2020 s/p oral intubation since 06/20/2020. MD has noted decline in Cognitive functioning d/t reasons for admit. Pt weaned from trach collar O2 support and from full vent support initially. He is awake, alert to follow through w/ tasks given Min-Mod cues at times. Easily distracted. Pt's cuff is deflated at Baseline, and he is wearing the PMV during waking hours to eat/drink and engage w/ Staff. He has a Actuary present in room d/t declined Cognitive awareness and poor insight and decision making.   Pt exhibited adequate vocalizations/verbalizations w/ PMV placed (cuff deflated at Baseline; PMV in place during the day) redirecting airflow superiorly w/out discomfort. He exhibits min decreased volume of speech and a gravely vocal quality -- MD requested to f/u w/ ENT for potential downsize of trach from a Shiley #8 to a Shiley #6 for better airflow around the trach for communication ease. He tolerated PMV placement for 30+ mins, along w/ Lunch meal w/out desat or discomfort reported by pt or noted by this SLP(O2 sats 98%). [facility] [facility] staff instructed on removing the Piatt if he is sleeping d/t his poor insight/decision making. He verbally communicated wants/needs w/ SLP. He often verbalized random thoughts asking "can I get a reference for a job when I get out of here; do bears sh-t in the woods all over the  place and how do you know it". Pt also quoted Al Pacino. During po trials at meal w/ PMV placed, pt presented w/ adequate oropharyngeal phase swallow w/ No oropharyngeal phase dysphagia noted. Pt consumed po trials w/ No overt, clinical s/s of aspiration during po trials. Pt appears at reduced risk for aspiration following general aspiration precautions and wearing his PMV. During po's, pt consumed all consistencies w/ no immediate, overt coughing, decline in vocal quality, or change in respiratory presentation during/post trials. Oral phase appeared grossly [facility] w/ timely bolus management, mastication, and control of bolus propulsion for A-P transfer for swallowing. Oral clearing achieved w/ all trial consistencies w/ min more time and attention needed to masticate solids d/t missing Dentition. Pt fed self w/ setup support. Noted pt ate w/ min+ Impulsivity taking Large bites. Education was given on Slowing down and using Small bites/sips to lessen risk for choking/aspiration. Pt agreed verbally and exhibited min follow through w/ instructions -- suspect impact from Cognitive status/decline (poor insight, reduced awareness of consequences). Sitter present; NSG to monitor during meals.  Recommend a Mech Soft consistency diet for cut, moist foods sec. to missing/poor quality Dentition and declined Pulmonary status; Thin liquids. General aspiration precautions. Tray setup and positioning at meals. Reduce distractions at meals.  Pills WHOLE in Puree for safer, easier swallowing as pt described Larger pills causing difficulty to swallow. Pt MUST WEAR PMV FOR ALL ORAL INTAKE including Pill Swallowing. Education given on Pills in Puree; food consistencies and easy to eat options; general aspiration precautions posted; PMV instructions posted. NSG/MD updated, agreed. Pt would benefit from formal Cognitive assessment and therapy at next venue of care (structured setting)  d/t current presentation of poor insight/awarness.  Pt must have 100% Supervision for safety concerns at D/C. Support in caring for the PMV also. MD/SW updated.    HPI HPI: Nicholas Valdez "Nicholas Valdez" is a 39 year old male with past medical history significant for EtOH and polysubstance abuse admitted with acute metabolic encephalopathy secondary to alcohol withdrawal and acute hypoxic respiratory failure due to encephalopathy and aspiration pneumonia.  Status post tracheostomy and PEG placement.  Pt was intubated at admit on 06/20/2020; trach placed 12/6; PEG placed 12/18. Pt weaned to trach collar O2 support 12/22.  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      SLP Plan  All goals met; NSG to monitor the wear/care of the PMV d/t pt's declined Cognitive functioning at this time       Recommendations  Diet recommendations: Dysphagia 3 (mechanical soft);Thin liquid (impulsive eating at times; decreased insight) Liquids provided via: Cup;Straw (monitor) Medication Administration: Whole meds with puree (as needed for safer swallowing d/t Cognitive decline) Supervision: Patient able to self feed;Intermittent supervision to cue for compensatory strategies Compensations: Minimize environmental distractions;Slow rate;Small sips/bites;Lingual sweep for clearance of pocketing;Follow solids with liquid Postural Changes and/or Swallow Maneuvers: Seated upright 90 degrees;Upright 30-60 min after meal (general Reflux precautions)      Patient may use Passy-Muir Speech Valve: During all therapies with supervision;During all waking hours (remove during sleep);During PO intake/meals;Caregiver trained to provide supervision PMSV Supervision: Intermittent MD: Please consider changing trach tube to : Smaller size (for improved phonation/talking)         General recommendations: OT consult;PT consult (formal Cognitive assessment post D/C) Oral Care Recommendations: Oral care BID;Oral care before and after PO;Staff/trained caregiver  to provide oral care Follow up Recommendations: None (except Cognitive evalution at next venue of care) SLP Visit Diagnosis: Dysphagia, unspecified (R13.10) (Tracheostomy; aphonia) Plan: All goals met       [place], Farmer, CCC-SLP Speech Language Pathologist Rehab Services [phone] [facility] 07/31/2020, 3:59 PM

## 2020-07-31 NOTE — PMR Pre-admission (Signed)
PMR Admission Coordinator Pre-Admission Assessment  Patient: Nicholas Valdez is an 39 y.o., male MRN: [id number] DOB: Oct 16, 1980 Height: 5' 10"  (177.8 cm) Weight: (!) 142.2 kg  Insurance Information HMO:     PPO:      PCP:      IPA:      80/20:      OTHER:  PRIMARY: Medicaid of [place]     Policy#: [id number]      Subscriber: pt CM Name: n/a      Phone#:      Fax#:  Pre-Cert#: verified online and via phone, MAF-CN coverage code      Employer:  Benefits:  Phone #:      Name:  Eff. Date:      Deduct:       Out of Pocket Max:       Life Max:  CIR: 100%     SNF: no benefits Outpatient:      Co-Pay:  Home Health:       Co-Pay:  DME:      Co-Pay:  Providers:  SECONDARY:       Policy#:      Phone#:   Development worker, community:       Phone#:   The "Data Collection Information Summary" for patients in Inpatient Rehabilitation Facilities with attached "Privacy Act [place] Records" was provided and verbally reviewed with: N/A  Emergency Contact Information Contact Information    Name Relation Home Work Mobile   Nicholas Valdez, Nicholas Valdez Mother [phone]     Nicholas Valdez, Nicholas Valdez Father   [phone]   Nicholas Valdez,Nicholas Valdez Friend [phone]  [phone]   Nicholas Valdez Friend [phone]        Current Medical History  Patient Admitting Diagnosis: debility  History of Present Illness:  Nicholas Valdez is a 39 year old right-handed male with history significant for obesity with BMI 44.98, hepatitis C as well as alcohol/polysubstance /tobacco abuse.  Per chart review patient lives with parent and 56 year old daughter.  Independent prior to admission.  1 level home 2 steps to entry.  Independent at baseline.  Presented 06/19/2020 to [facility] for inpatient detox but was found to be confused with nausea and was sent to the emergency department.  Patient was seen in the emergency department approxi-2 weeks prior for alcohol withdrawal started on Librium protocol.  Cranial CT scan negative.  Admission  chemistries glucose 137 calcium 7.8, hemoglobin 13.3, alcohol 15, ammonia level 76, lactic acid 3.3, CK 138.  He was emergently intubated for airway protection.  Patient with prolonged ICU course continued to have alcohol withdrawal agitation required Precedex drip.  Required tracheostomy tube 07/10/2020 per Dr. Carloyn Manner with #8 cuffed Shiley placed as well as placement of gastrostomy tube for nutritional support 07/22/2020 per Dr. Caroleen Hamman.  Tracheal aspirate Elizabethkingia Meningoseptica and placed on broad-spectrum antibiotics that have since been completed.  As of 08/01/2020 there was no current plan to downsize this patient remained with #8 Shiley trach as well as speech therapy working with Passy-Muir valve.  Patient has required sitters for agitation restlessness and remains on Seroquel as well as Zyprexa/valproic acid.  He is tolerating a mechanical soft thin liquid diet.  Maintained on Lovenox for DVT prophylaxis hospital course acute on chronic anemia latest hemoglobin 9.9 and chemistries unremarkable as of 08/02/2020. Patient's medical record from [facility] has been reviewed by the rehabilitation admission coordinator and physician.  Past Medical History  Past Medical History:  Diagnosis Date  . Alcohol abuse   . Anxiety   .  Depression   . Drug abuse ([place])   . GERD (gastroesophageal reflux disease)   . Hepatitis C   . Hypertension   . Pneumonia   . Pre-diabetes   . Sleep apnea     Family History   family history includes Hypertension in an other family member.  Prior Rehab/Hospitalizations Has the patient had prior rehab or hospitalizations prior to admission? No  Has the patient had major surgery during 100 days prior to admission? Yes   Current Medications  Current Facility-Administered Medications:  .  0.9 %  sodium chloride infusion, 250 mL, Intravenous, Continuous, Pabon, Marjory Lies, MD, Stopped at 07/27/20 [phone] .  acetaminophen (TYLENOL)  tablet 650 mg, 650 mg, Oral, Q6H PRN **OR** acetaminophen (TYLENOL) suppository 650 mg, 650 mg, Rectal, Q6H PRN, Chappell, Alex B, RPH .  albuterol (PROVENTIL) (2.5 MG/3ML) 0.083% nebulizer solution 2.5 mg, 2.5 mg, Nebulization, Q4H PRN, Pabon, Diego F, MD .  calamine lotion, , Topical, PRN, Jules Husbands, MD, 1 application at 02/58/52 1429 .  chlorhexidine (PERIDEX) 0.12 % solution 15 mL, 15 mL, Mouth Rinse, BID, Darel Hong D, NP, 15 mL at 08/03/20 2144 .  docusate (COLACE) 50 MG/5ML liquid 100 mg, 100 mg, Oral, BID, Benita Gutter, RPH, 100 mg at 08/03/20 2145 .  enoxaparin (LOVENOX) injection 70 mg, 0.5 mg/kg, Subcutaneous, Q24H, Shawna Clamp, MD, 70 mg at 08/03/20 1424 .  feeding supplement (ENSURE ENLIVE / ENSURE PLUS) liquid 237 mL, 237 mL, Oral, TID BM, Aleskerov, Fuad, MD, 237 mL at 08/03/20 2000 .  feeding supplement (PROSource TF) liquid 45 mL, 45 mL, Per Tube, TID, Aleskerov, Fuad, MD, 45 mL at 08/03/20 1720 .  fentaNYL (DURAGESIC) 100 MCG/HR 1 patch, 1 patch, Transdermal, Q72H, Pabon, Diego F, MD, 1 patch at 08/01/20 1616 .  folic acid (FOLVITE) tablet 1 mg, 1 mg, Oral, Daily, Benita Gutter, RPH, 1 mg at 08/03/20 1056 .  gabapentin (NEURONTIN) capsule 600 mg, 600 mg, Oral, Q8H, Dallie Piles, RPH, 600 mg at 08/04/20 0501 .  haloperidol (HALDOL) tablet 1 mg, 1 mg, Oral, Q6H PRN, 1 mg at 08/02/20 0225 **OR** haloperidol lactate (HALDOL) injection 1 mg, 1 mg, Intramuscular, Q6H PRN, Benita Gutter, RPH, 1 mg at 07/31/20 0127 .  HYDROmorphone (DILAUDID) injection 1 mg, 1 mg, Intravenous, Q2H PRN, Pabon, Diego F, MD, 1 mg at 08/04/20 0132 .  HYDROmorphone (DILAUDID) tablet 4 mg, 4 mg, Oral, Once, Shalhoub, Sherryll Burger, MD .  LORazepam (ATIVAN) tablet 0.5 mg, 0.5 mg, Oral, Q4H PRN, Shelly Coss, MD, 0.5 mg at 08/03/20 2144 .  magnesium oxide (MAG-OX) tablet 400 mg, 400 mg, Oral, BID, Dallie Piles, RPH, 400 mg at 08/03/20 2144 .  melatonin tablet 5 mg, 5 mg, Oral, QHS,  Benita Gutter, RPH, 5 mg at 08/03/20 2144 .  multivitamin with minerals tablet 1 tablet, 1 tablet, Oral, Daily, Dallie Piles, RPH, 1 tablet at 08/03/20 1056 .  nystatin (MYCOSTATIN/NYSTOP) topical powder, , Topical, BID, Pabon, Marjory Lies, MD, Given at 08/03/20 1056 .  OLANZapine (ZYPREXA) tablet 5 mg, 5 mg, Oral, QID PRN, Benita Gutter, RPH, 5 mg at 08/03/20 1055 .  oxyCODONE (Oxy IR/ROXICODONE) immediate release tablet 5 mg, 5 mg, Oral, Q6H PRN, Shelly Coss, MD, 5 mg at 08/04/20 0501 .  pantoprazole sodium (PROTONIX) 40 mg/20 mL oral suspension 40 mg, 40 mg, Oral, Daily, Benita Gutter, RPH, 40 mg at 08/01/20 7782 .  polyethylene glycol (MIRALAX / GLYCOLAX) packet 17 g,  17 g, Oral, Daily, Benita Gutter, RPH, 17 g at 08/03/20 1057 .  polyvinyl alcohol (LIQUIFILM TEARS) 1.4 % ophthalmic solution 1 drop, 1 drop, Both Eyes, BID, Pabon, Diego F, MD, 1 drop at 08/03/20 2144 .  QUEtiapine (SEROQUEL) tablet 100 mg, 100 mg, Oral, BID, Benita Gutter, RPH, 100 mg at 08/03/20 2144 .  scopolamine (TRANSDERM-SCOP) 1 MG/3DAYS 1.5 mg, 1 patch, Transdermal, Q72H, Kasa, Kurian, MD, 1.5 mg at 08/01/20 1229 .  thiamine tablet 100 mg, 100 mg, Oral, Daily, Benita Gutter, RPH, 100 mg at 08/03/20 1057 .  valproic acid (DEPAKENE) 250 MG/5ML solution 500 mg, 500 mg, Oral, BID, Dallie Piles, RPH, 500 mg at 08/03/20 2144 .  vitamin B-12 (CYANOCOBALAMIN) tablet 1,000 mcg, 1,000 mcg, Oral, Daily, Benita Gutter, RPH, 1,000 mcg at 08/03/20 1056  Patients Current Diet:  Diet Order            DIET DYS 3 Room service appropriate? Yes with Assist; Fluid consistency: Thin  Diet effective now                 Precautions / Restrictions Precautions Precautions: Fall Precaution Comments: trach collar/Passy muir Restrictions Weight Bearing Restrictions: No   Has the patient had 2 or more falls or a fall with injury in the past year? Yes  Prior Activity Level Community (5-7x/wk): fully  independent prior to admit, driving, no DME at baseline  Prior Functional Level Self Care: Did the patient need help bathing, dressing, using the toilet or eating? Independent  Indoor Mobility: Did the patient need assistance with walking from room to room (with or without device)? Independent  Stairs: Did the patient need assistance with internal or external stairs (with or without device)? Independent  Functional Cognition: Did the patient need help planning regular tasks such as shopping or remembering to take medications? Independent  Home Assistive Devices / Equipment Home Assistive Devices/Equipment: None Home Equipment: None  Prior Device Use: Indicate devices/aids used by the patient prior to current illness, exacerbation or injury? None of the above  Current Functional Level Cognition  Overall Cognitive Status: Impaired/Different from baseline Difficult to assess due to: Tracheostomy Orientation Level: Oriented to person,Oriented to place,Disoriented to time,Disoriented to situation Following Commands: Follows one step commands with increased time Safety/Judgement: Decreased awareness of safety,Decreased awareness of deficits General Comments: Pt is alert but easily distracted. Word salad in conversations. Requires re-direction throughout to attend to task. [employer] Scales of Cognitive Functioning: Confused/appropriate    Extremity Assessment (includes Sensation/Coordination)  Upper Extremity Assessment: Generalized weakness  Lower Extremity Assessment: Generalized weakness    ADLs  Overall ADL's : Needs assistance/impaired Eating/Feeding: Set up,Sitting Eating/Feeding Details (indicate cue type and reason): to take drink from cup with straw/lid Grooming: Wash/dry face,Supervision/safety,Set up,Min guard,Standing,Cueing for sequencing Grooming Details (indicate cue type and reason): sink-side with OT cueing for sequence of task and PT cueing pt for  balance/safety. Upper Body Bathing: Sitting,Cueing for safety,Minimal assistance Lower Body Bathing: Moderate assistance,Sitting/lateral leans,Cueing for safety Upper Body Dressing : Set up,Minimal assistance,Sitting Lower Body Dressing: Minimal assistance,Sitting/lateral leans,Cueing for sequencing Lower Body Dressing Details (indicate cue type and reason): to don socks while seated EOB, needs FWD chaining to successfully complete task with OT completing first step and pt able to then better conceptualize subsequent steps of ADL task. Toilet Transfer: Moderate assistance,BSC,Stand-pivot,+2 for safety/equipment Toileting- Clothing Manipulation and Hygiene: Sit to/from stand,Cueing for sequencing,Cueing for safety,Moderate assistance Functional mobility during ADLs: Min guard,Rolling walker (to complete lap  around the unit. OT cueing for posture/negotiating pathway while PT cueing pt for safe use of RW and balance.) General ADL Comments: Functional mobility assessment limited, pt declines STS transfers at this time. Performs bed mobility with min A for BLE mgt. Variable assist to maintain seated balance at EOB, occasionally requiring +2 to maintain sitting balance.  Anticipate MOD A with +2 for safety/chair follow for functional mobility.    Mobility  Overal bed mobility: Modified Independent Bed Mobility: Supine to Sit,Sit to Supine Supine to sit: Min guard,HOB elevated Sit to supine: Min guard General bed mobility comments: Needs close supervision/CGA at times due to impulsivity    Transfers  Overall transfer level: Needs assistance Equipment used: None,Rolling walker (2 wheeled) Transfers: Sit to/from Stand Sit to Stand: +2 safety/equipment,Min guard,Supervision General transfer comment: +2 person for safety. lifting assistance required for standing.    Ambulation / Gait / Stairs / Wheelchair Mobility  Ambulation/Gait Ambulation/Gait assistance: Counsellor (Feet): 160  Feet Assistive device: Rolling walker (2 wheeled),None Gait Pattern/deviations: Staggering left,Staggering right General Gait Details: Pt ambulated ~ 50 ft without AD with very unsteady gait kinematics. Ambulated ~ 120 ft ith RW without LOB however still has impulsivity that makes him fall risk. Gait velocity: WNL    Posture / Balance Dynamic Sitting Balance Sitting balance - Comments: no gross loss of balance. close stand by assistance to Min guard provided for safety due to impulsive behavior Balance Overall balance assessment: Needs assistance Sitting-balance support: Feet supported Sitting balance-Leahy Scale: Fair Sitting balance - Comments: no gross loss of balance. close stand by assistance to [place] guard provided for safety due to impulsive behavior Postural control: Posterior lean,Right lateral lean Standing balance support: During functional activity,No upper extremity supported,Bilateral upper extremity supported Standing balance-Leahy Scale: Poor Standing balance comment: Poor balance without BUE support. has staggering L/R with ambulation. Improved to F with B UE support of RW    Special needs/care consideration Trach size 8, Diabetic management yes and Behavioral consideration encephalopathy, CIWA       Trach #8 cuffed use of PMSV on room air      1:1 safety sitter  Previous Home Environment (from acute therapy documentation) Living Arrangements: Parent,Children Available Help at Discharge: Family,Available 24 hours/day Type of Home: House Home Layout: One level Home Access: Stairs to enter Entrance Stairs-Rails: Left,Right,Can reach both Entrance Stairs-Number of Steps: 2 Bathroom Shower/Tub: Engineer, petroleum: Standard Home Care Services: No Additional Comments: gathered from OT assessment, pt communicating mostly via writting  Discharge Living Setting Plans for Discharge Living Setting: Lives with (comment) (mom and 75 y/o daughter) Type of  Home at Discharge: House Discharge Home Layout: One level Discharge Home Access: Stairs to enter Entrance Stairs-Rails: Field seismologist of Steps: 2-3 Discharge Bathroom Shower/Tub: Tub/shower unit Discharge Bathroom Toilet: Standard Discharge Bathroom Accessibility: Yes How Accessible: Accessible via walker Does the patient have any problems obtaining your medications?: No  Social/Family/Support Systems Patient Roles: Parent (daughter is 79) Anticipated Caregiver: mom, Nicholas Valdez Anticipated Caregiver's Contact Information: [phone] Ability/Limitations of Caregiver: n/a Caregiver Availability: 24/7 Discharge Plan Discussed with Primary Caregiver: Yes Is Caregiver In Agreement with Plan?: Yes Does Caregiver/Family have Issues with Lodging/Transportation while Pt is in Rehab?: No  Goals Patient/Family Goal for Rehab: PT/OT supervision to mod I, SLP mod I Expected length of stay: 12-16 days Additional Information: MAF, no SNF benefits Pt/Family Agrees to Admission and willing to participate: Yes Program Orientation Provided & Reviewed with Pt/Caregiver Including Roles  &  Responsibilities: Yes  Barriers to Discharge: Insurance for SNF coverage  Decrease burden of Care through IP rehab admission: n/a  Possible need for SNF placement upon discharge: not anticipated, no SNF benefits  Patient Condition: I have reviewed medical records from [facility], spoken with CSW, and family member. I discussed via phone for inpatient rehabilitation assessment.  Patient will benefit from ongoing PT, OT and SLP, can actively participate in 3 hours of therapy a day 5 days of the week, and can make measurable gains during the admission.  Patient will also benefit from the coordinated team approach during an Inpatient Acute Rehabilitation admission.  The patient will receive intensive therapy as well as Rehabilitation physician, nursing, social worker, and care management interventions.   Due to bladder management, bowel management, safety, skin/wound care, disease management, medication administration, pain management and patient education the patient requires 24 hour a day rehabilitation nursing.  The patient is currently min assist to min guard with mobility and basic ADLs.  Discharge setting and therapy post discharge at home with home health is anticipated.  Patient has agreed to participate in the Acute Inpatient Rehabilitation Program and will admit today.  Preadmission Screen Completed By: Shann Medal, PT, DPT, 9:57 AM , 08/04/20 ______________________________________________________________________   Discussed status with Dr. Posey Pronto on 08/04/20  at 9:57 AM  and received approval for admission today.  Admission Coordinator: Michel Santee, PT, time 9:57 AM Sudie Grumbling 08/04/20    Assessment/Plan: Diagnosis: Debility 1. Does the need for close, 24 hr/day Medical supervision in concert with the patient's rehab needs make it unreasonable for this patient to be served in a less intensive setting? Potentially 2. Co-Morbidities requiring supervision/potential complications: morbid obesity (encourage weight loss), hepatitis C (order LFTs, avoid hepatotoxic meds), alcohol/polysubstance /tobacco abuse (counsel when appropriate) 3. Due to safety, disease management, medication administration and patient education, does the patient require 24 hr/day rehab nursing? Yes 4. Does the patient require coordinated care of a physician, rehab nurse, PT, OT, and SLP to address physical and functional deficits in the context of the above medical diagnosis(es)? Potentially Addressing deficits in the following areas: balance, endurance, locomotion, strength, transferring, bathing, dressing, toileting, cognition and psychosocial support 5. Can the patient actively participate in an intensive therapy program of at least 3 hrs of therapy 5 days a week? Yes 6. The potential for patient to make measurable  gains while on inpatient rehab is excellent 7. Anticipated functional outcomes upon discharge from inpatient rehab: modified independent and supervision PT, modified independent and supervision OT, supervision SLP 8. Estimated rehab length of stay to reach the above functional goals is: 3-6 days. 9. Anticipated discharge destination: Home 10. Overall Rehab/Functional Prognosis: good   MD Signature: Delice Lesch, MD, ABPMR

## 2020-07-31 NOTE — Progress Notes (Signed)
Pt asleep. Removed PMV and placed on 28% ATC. Sitter at bedside.

## 2020-07-31 NOTE — Progress Notes (Signed)
Patient attempted to pull PEG tube and mittens were put on. He kept them on for most of night and were removed when he needed to go to the bathroom. He received Ativan 2 mg IV Qhr, Zyprexa, and Haldol 1 mg IM. Ativan effective 2 hours and then patient is agitated and trying to get out of bed. Telesitter was implemented, but patient continues to want to get up multiple times. NP Ouma ok with having 1:1 sitter.    Updated Santina Evans on patient status by phone and patient also called her this morning.

## 2020-07-31 NOTE — Progress Notes (Signed)
PROGRESS NOTE    Nicholas Valdez  LFY:[id number] DOB: 03/12/1981 DOA: 06/19/2020 PCP: Oswaldo Conroy, MD   Brief Narrative:  This 39 years old male with PMH significant for EtOH and polysubstance abuse admitted with acute metabolic encephalopathy secondary to alcohol withdrawal and acute hypoxic respiratory failure due to aspiration pneumonia and encephalopathy.  Patient is s/p tracheostomy. Patient has prolonged ICU course, continues to have withdrawal symptoms with agitation requiring Precedex drip. Patient is much improved, alert and oriented x 2 , participated in conversation. PCCM pickup 12/24. Patient has participated with physical therapy.  PT recommended CIR.  Assessment & Plan:   Principal Problem:   Alcohol withdrawal delirium (HCC) Active Problems:   Increased ammonia level   Acute metabolic encephalopathy   Elevated LFTs   Tobacco abuse   Elevated lactic acid level   Acute respiratory failure with hypoxia (HCC)   Pressure injury of skin   Acute hypoxic respiratory failure secondary to acute encephalopathy due to alcohol withdrawal. Patient was intubated on arrival,  admitted in the ICU and had a prolonged ICU course. Patient remained intubated, was tried to extubate several times but was not successful. Patient had tracheostomy tube placed by ENT. Patient has episodes of agitation,  requiring Precedex drip. Patient is stable, PCCM pickup 12/24. Patient also found to have pneumonia, tracheal aspirate grew ElizabethKingia species. Infectious disease consulted, continued on antibiotics. Patient has completed 7 days of treatment for ElizabethKingia in endotracheal culture with concern for nosocomial pneumonia Continue bronchodilators as ordered. Aspiration pneumonia: Completed antibiotic course. Speech and swallow evaluation completed, recommended dysphagia 3 diet. Patient has been tolerating diet,  more comfortable.  Acute Toxic Metabolic  Encephalopathy: Continue supportive care. Continue scheduled oxycodone and Valium, fentanyl patch,  Seroquel increased to 100 mg twice daily Psych following, appreciate input CT head with no acute abnormality. Patient was agitated and restless yesterday requiring Haldol and Ativan.   Hepatitis C: Continue outpatient treatment  Chronic anemia without s/sx of bleeding: Hemoglobin remains stable 9.8. We will recheck CBC. Consider transfusion if hemoglobin drops below 7.  Hyperglycemia: Regular insulin sliding scale.   DVT prophylaxis: Lovenox Code Status: Full code. Family Communication: No family at bedside. Disposition Plan:  Status is: Inpatient  Remains inpatient appropriate because:Inpatient level of care appropriate due to severity of illness   Dispo: The patient is from: Home              Anticipated d/c is to: CIR              Anticipated d/c date is: > 3 days              Patient currently is not medically stable to d/c.    Consultants:   PCCM, ENT, Psychiatry  Procedures: S/p tracheostomy, status post PEG tube. Antimicrobials:  Anti-infectives (From admission, onward)   Start     Dose/Rate Route Frequency Ordered Stop   07/21/20 1700  ciprofloxacin (CIPRO) IVPB 400 mg  Status:  Discontinued        400 mg 200 mL/hr over 60 Minutes Intravenous Every 12 hours 07/21/20 1543 07/27/20 1056   07/13/20 0800  minocycline (MINOCIN) capsule 100 mg       "Followed by" Linked Group Details   100 mg Per Tube 2 times daily 07/12/20 1305 07/18/20 1958   07/12/20 1400  minocycline (MINOCIN) capsule 200 mg       "Followed by" Linked Group Details   200 mg Per Tube  Once 07/12/20 1305 07/12/20  1447   07/06/20 2200  fluconazole (DIFLUCAN) tablet 100 mg  Status:  Discontinued        100 mg Per Tube Daily at bedtime 07/05/20 1525 07/12/20 0908   07/06/20 0400  anidulafungin (ERAXIS) 50 mg in sodium chloride 0.9 % 50 mL IVPB  Status:  Discontinued       "Followed by" Linked  Group Details   50 mg 78 mL/hr over 50 Minutes Intravenous Every 24 hours 07/05/20 0313 07/05/20 1525   07/05/20 2200  fluconazole (DIFLUCAN) tablet 200 mg        200 mg Per Tube  Once 07/05/20 1525 07/05/20 2211   07/05/20 0400  anidulafungin (ERAXIS) 100 mg in sodium chloride 0.9 % 100 mL IVPB       "Followed by" Linked Group Details   100 mg 78 mL/hr over 100 Minutes Intravenous  Once 07/05/20 0313 07/05/20 0554   07/03/20 0500  Ampicillin-Sulbactam (UNASYN) 3 g in sodium chloride 0.9 % 100 mL IVPB  Status:  Discontinued        3 g 200 mL/hr over 30 Minutes Intravenous Every 6 hours 07/03/20 0349 07/07/20 1331   06/26/20 0000  vancomycin (VANCOREADY) IVPB 1750 mg/350 mL  Status:  Discontinued        1,750 mg 175 mL/hr over 120 Minutes Intravenous Every 24 hours 06/24/20 2330 06/27/20 1123   06/25/20 0015  vancomycin (VANCOREADY) IVPB 2000 mg/400 mL       "Followed by" Linked Group Details   2,000 mg 200 mL/hr over 120 Minutes Intravenous  Once 06/24/20 2325 06/25/20 0204   06/25/20 0015  vancomycin (VANCOREADY) IVPB 500 mg/100 mL       "Followed by" Linked Group Details   500 mg 100 mL/hr over 60 Minutes Intravenous  Once 06/24/20 2325 06/25/20 0105   06/21/20 1000  Ampicillin-Sulbactam (UNASYN) 3 g in sodium chloride 0.9 % 100 mL IVPB  Status:  Discontinued        3 g 200 mL/hr over 30 Minutes Intravenous Every 6 hours 06/21/20 0925 06/27/20 1123      Subjective: Patient was seen and examined at bedside.  Overnight events noted.  Patient was agitated and restless requiring Haldol and Ativan yesterday.  Patient wants to be discharged.  Patient has tracheostomy tube and PEG tube.  Objective: Vitals:   07/30/20 2035 07/31/20 0425 07/31/20 0500 07/31/20 0919  BP: (!) 130/96 114/68  132/86  Pulse: (!) 107 (!) 109  (!) 116  Resp: 19 19  18   Temp: 99.9 F (37.7 C) 98.2 F (36.8 C)  98.3 F (36.8 C)  TempSrc: Oral Oral  Oral  SpO2: 100% 97%  97%  Weight:   (!) 139.6 kg    Height:        Intake/Output Summary (Last 24 hours) at 07/31/2020 1404 Last data filed at 07/31/2020 0500 Gross per 24 hour  Intake 900 ml  Output --  Net 900 ml   Filed Weights   07/28/20 0330 07/29/20 0600 07/31/20 0500  Weight: (!) 137.2 kg (!) 139.8 kg (!) 139.6 kg    Examination:  General exam: Appears calm and comfortable , not in any distress, tracheostomy tube noted Respiratory system: Clear to auscultation. Respiratory effort normal. Cardiovascular system: S1 & S2 heard, RRR. No JVD, murmurs, rubs, gallops or clicks. No pedal edema. Gastrointestinal system: Abdomen is nondistended, soft and nontender. No organomegaly or masses felt.  PEG tube noted.  No signs of infection.  Normal bowel sounds heard. Central nervous  system: Alert and oriented. No focal neurological deficits. Extremities: No edema, no cyanosis, no clubbing. Skin: No rashes, lesions or ulcers Psychiatry: Judgement and insight appear normal. Mood & affect appropriate.     Data Reviewed: I have personally reviewed following labs and imaging studies  CBC: Recent Labs  Lab 07/27/20 0606 07/28/20 0423 07/29/20 0444 07/30/20 0557 07/31/20 0535  WBC 9.5 9.5 8.5 8.5 7.5  HGB 10.8* 9.8* 10.5* 9.4* 9.4*  HCT 32.2* 29.9* 30.6* 28.3* 27.4*  MCV 99.1 101.0* 98.1 98.3 98.6  PLT 325 325 311 313 311   Basic Metabolic Panel: Recent Labs  Lab 07/26/20 0403 07/27/20 0606 07/28/20 0423 07/29/20 0444 07/30/20 0557 07/31/20 0535  NA 135  --  137 136 133* 134*  K 3.5  --  3.4* 3.8 3.7 3.2*  CL 99  --  100 100 97* 98  CO2 27  --  28 27 27 28   GLUCOSE 119*  --  113* 110* 122* 121*  BUN 14  --  24* 16 12 10   CREATININE 0.80  --  0.71 0.63 0.65 0.67  CALCIUM 9.2  --  8.9 9.0 9.3 8.9  MG 2.0 2.1 2.1 2.1 2.1 2.0  PHOS 4.3 5.0* 4.9* 4.7* 4.8* 5.0*   GFR: Estimated Creatinine Clearance: 178.1 mL/min (by C-G formula based on SCr of 0.67 mg/dL). Liver Function Tests: Recent Labs  Lab 07/31/20 0535  AST  23  ALT 24  ALKPHOS 47  BILITOT 0.3  PROT 6.7  ALBUMIN 3.2*   No results for input(s): LIPASE, AMYLASE in the last 168 hours. No results for input(s): AMMONIA in the last 168 hours. Coagulation Profile: No results for input(s): INR, PROTIME in the last 168 hours. Cardiac Enzymes: No results for input(s): CKTOTAL, CKMB, CKMBINDEX, TROPONINI in the last 168 hours. BNP (last 3 results) No results for input(s): PROBNP in the last 8760 hours. HbA1C: No results for input(s): HGBA1C in the last 72 hours. CBG: Recent Labs  Lab 07/30/20 1647 07/30/20 2038 07/31/20 0420 07/31/20 0811 07/31/20 1219  GLUCAP 137* 104* 118* 103* 118*   Lipid Profile: No results for input(s): CHOL, HDL, LDLCALC, TRIG, CHOLHDL, LDLDIRECT in the last 72 hours. Thyroid Function Tests: No results for input(s): TSH, T4TOTAL, FREET4, T3FREE, THYROIDAB in the last 72 hours. Anemia Panel: No results for input(s): VITAMINB12, FOLATE, FERRITIN, TIBC, IRON, RETICCTPCT in the last 72 hours. Sepsis Labs: No results for input(s): PROCALCITON, LATICACIDVEN in the last 168 hours.  No results found for this or any previous visit (from the past 240 hour(s)).   Radiology Studies: No results found.  Scheduled Meds: . chlorhexidine  15 mL Mouth Rinse BID  . Chlorhexidine Gluconate Cloth  6 each Topical Daily  . docusate  100 mg Oral BID  . enoxaparin (LOVENOX) injection  0.5 mg/kg Subcutaneous Q24H  . feeding supplement  237 mL Oral TID BM  . feeding supplement (PROSource TF)  45 mL Per Tube TID  . fentaNYL  1 patch Transdermal Q72H  . folic acid  1 mg Oral Daily  . free water  60 mL Per Tube Q24H  . gabapentin  600 mg Oral Q8H  . insulin aspart  0-15 Units Subcutaneous Q4H  . mouth rinse  15 mL Mouth Rinse q12n4p  . melatonin  5 mg Oral QHS  . multivitamin with minerals  1 tablet Oral Daily  . nystatin   Topical BID  . oxyCODONE  5 mg Oral Q4H  . pantoprazole sodium  40 mg  Oral Daily  . polyethylene glycol   17 g Oral Daily  . polyvinyl alcohol  1 drop Both Eyes BID  . QUEtiapine  100 mg Oral BID  . scopolamine  1 patch Transdermal Q72H  . thiamine  100 mg Oral Daily  . valproic acid  500 mg Oral BID  . vitamin B-12  1,000 mcg Oral Daily   Continuous Infusions: . sodium chloride Stopped (07/27/20 0929)     LOS: 42 days    Time spent: 25 mins.    Cipriano Bunker, MD [facility]   If 7PM-7AM, please contact night-coverage

## 2020-07-31 NOTE — Progress Notes (Signed)
PHARMACY CONSULT NOTE  Pharmacy Consult for Electrolyte Monitoring and Replacement   Recent Labs: Potassium (mmol/L)  Date Value  07/31/2020 3.2 (L)  02/12/2013 3.9   Magnesium (mg/dL)  Date Value  97/41/6384 2.0   Calcium (mg/dL)  Date Value  53/64/6803 8.9   Calcium, Total (mg/dL)  Date Value  21/22/4825 8.7   Albumin (g/dL)  Date Value  00/37/0488 3.2 (L)  02/12/2013 3.6   Phosphorus (mg/dL)  Date Value  89/16/9450 5.0 (H)   Sodium (mmol/L)  Date Value  07/31/2020 134 (L)  02/12/2013 139   Assessment: 40 year old male here with encephalopathy s/t alcohol withdrawal.  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 Pharmacy to manage electrolytes. PEG tube/Trach  Goal of Therapy:  Electrolytes WNL  Plan:  K 3.2  Mag 2.0  Phos 5.0  Scr 0.67  Will order KCL po packet per tube 40 meq x 1 dose  Will continue to follow electrolytes   Gryffin Altice A 07/31/2020 7:42 AM

## 2020-07-31 NOTE — Progress Notes (Signed)
Inpatient Rehab Admissions Coordinator:   Spoke to pt's mother to discuss goals and expectations of CIR stay. We discussed supervision level goals (initial 24/7 supervision, which she can provide), and estimated length of stay to be about 2 weeks.  I did review insurance coverage for CIR with her, and let her know that pt's medicaid would not likely cover SNF.  She verbalized understanding.  I do not have a bed available for pt to admit to CIR today, but will continue to follow for potential admit pending bed availability and medical stability.   Estill Dooms, PT, DPT Admissions Coordinator [phone] 07/31/20  11:09 AM

## 2020-07-31 NOTE — Progress Notes (Signed)
Physical Therapy Treatment Patient Details Name: Nicholas Valdez MRN: [id number] DOB: 23-May-1981 Today's Date: 07/31/2020    History of Present Illness Nicholas Valdez "Nicholas Valdez" is a 39 year old male with past medical history significant for EtOH and polysubstance abuse admitted with acute metabolic encephalopathy secondary to alcohol withdrawal and acute hypoxic respiratory failure due to encephalopathy and aspiration pneumonia.  Status post tracheostomy.    PT Comments    Patient is making progress towards meeting functional goals. With extra time for motor planning and processing, patient able to stand with Min A +2 for safety and take several steps close to bed. Patient has decreased awareness of limitations and needs cues for attention to task. Patient has increased standing tolerance this session with no reported dizziness or nausea with activity. Heart rate does notably increase to 130's consistently with exertion, however decreases to 110's with rest. CIR remains appropriate discharge plan at this time. Recommend to continue PT to maximize independence and address remaining functional limitations.     Follow Up Recommendations  CIR     Equipment Recommendations   (to be determined at next level of care)    Recommendations for Other Services       Precautions / Restrictions Precautions Precautions: Fall Precaution Comments: trach collar/Passy muir Restrictions Weight Bearing Restrictions: No    Mobility  Bed Mobility Overal bed mobility: Needs Assistance Bed Mobility: Supine to Sit;Sit to Supine     Supine to sit: Min guard;HOB elevated Sit to supine: Min guard   General bed mobility comments: Min guard for safety.  Transfers Overall transfer level: Needs assistance   Transfers: Sit to/from Stand Sit to Stand: +2 safety/equipment;Min assist         General transfer comment: +2 person for safety. lifting assistance required for  standing.  Ambulation/Gait Ambulation/Gait assistance: Min assist;+2 safety/equipment     Gait Pattern/deviations: Wide base of support     General Gait Details: patient able to take one step forward, backward, and several side steps with cues for technique and to narrow base of support.steadying assistance provided for safety. no reported dizziness with upright activity, although patient appears fatigued with activity. Sp02 98% on room air, heart rate 130's.   Stairs             Wheelchair Mobility    Modified Rankin (Stroke Patients Only)       Balance Overall balance assessment: Needs assistance Sitting-balance support: Feet supported Sitting balance-Leahy Scale: Fair Sitting balance - Comments: no gross loss of balance. close stand by assistance to Min guard provided for safety due to impulsive behavior   Standing balance support: No upper extremity supported Standing balance-Leahy Scale: Poor Standing balance comment: Min A required for safety to maintain standing balance                            Cognition Arousal/Alertness: Lethargic Behavior During Therapy: Impulsive;Flat affect Overall Cognitive Status: No family/caregiver present to determine baseline cognitive functioning (presumably different from baseline) Area of Impairment: Following commands;Safety/judgement;Rancho level               Rancho Levels of Cognitive Functioning [place] Mirant Scales of Cognitive Functioning: Confused/appropriate       Following Commands: Follows one step commands with increased time Safety/Judgement: Decreased awareness of safety;Decreased awareness of deficits     General Comments: increased time required for following commands. patient lethargic at times and required cues to remain alert. patient  needs cues for attention to task      Exercises      General Comments        Pertinent Vitals/Pain Pain Assessment: No/denies pain    Home  Living                      Prior Function            PT Goals (current goals can now be found in the care plan section) Acute Rehab PT Goals Patient Stated Goal: to regain strength PT Goal Formulation: With patient Time For Goal Achievement: 08/10/20 Potential to Achieve Goals: Good Progress towards PT goals: Progressing toward goals    Frequency    Min 2X/week      PT Plan Current plan remains appropriate    Co-evaluation PT/OT/SLP Co-Evaluation/Treatment: Yes Reason for Co-Treatment: Necessary to address cognition/behavior during functional activity;To address functional/ADL transfers PT goals addressed during session: Mobility/safety with mobility;Strengthening/ROM        AM-PAC PT "6 Clicks" Mobility   Outcome Measure  Help needed turning from your back to your side while in a flat bed without using bedrails?: A Little Help needed moving from lying on your back to sitting on the side of a flat bed without using bedrails?: A Little Help needed moving to and from a bed to a chair (including a wheelchair)?: Total Help needed standing up from a chair using your arms (e.g., wheelchair or bedside chair)?: Total Help needed to walk in hospital room?: Total Help needed climbing 3-5 steps with a railing? : Total 6 Click Score: 10    End of Session   Activity Tolerance: Patient limited by fatigue;Patient tolerated treatment well Patient left: in bed;with call bell/phone within reach;with nursing/sitter in room Nurse Communication: Mobility status PT Visit Diagnosis: Muscle weakness (generalized) (M62.81);Difficulty in walking, not elsewhere classified (R26.2);Unsteadiness on feet (R26.81)     Time: 1330-1400 PT Time Calculation (min) (ACUTE ONLY): 30 min  Charges:  $Therapeutic Activity: 8-22 mins                     Donna Bernard, PT, MPT    Ina Homes 07/31/2020, 2:37 PM

## 2020-07-31 NOTE — Progress Notes (Signed)
Occupational Therapy Treatment Patient Details Name: Nicholas Valdez MRN: [id number] DOB: 1980/11/13 Today's Date: 07/31/2020    History of present illness Nicholas Valdez "Nicholas Valdez" is a 39 year old male with past medical history significant for EtOH and polysubstance abuse admitted with acute metabolic encephalopathy secondary to alcohol withdrawal and acute hypoxic respiratory failure due to encephalopathy and aspiration pneumonia.  Status post tracheostomy.   OT comments  Pt seen for OT/PT co-treatment today for safety in setting of decreased cognition/safety awareness. Pt is intermittently lethargic during session requiring cues to attend. Pt requires MIN A +2 for CTS with arm in arm technique and CGA/MIN A for static standing with b/l support. Pt able to take small step to sink with MIN A and MIN verbal/tactile cues for safety. Demos P static standing balance with MIN A required to sustain throughout. PT assists with balance and cues while OT engages pt in oral care including brushing and rinsing with MIN A/SUPV and SETUP with MIN verbal/tactile cueing for sequence of task. Pt tolerates moderately well. Left in bed with sitter present and call bell in reach. Pt with improving tolerance with therapy. Will continue to follow and continue to anticipate pt will require CIR to improve/restore tolerance for fxl mobility, ADL transfers and self care ADLs.    Follow Up Recommendations  CIR    Equipment Recommendations  3 in 1 bedside commode    Recommendations for Other Services      Precautions / Restrictions Precautions Precautions: Fall Precaution Comments: trach collar/Passy muir Restrictions Weight Bearing Restrictions: No       Mobility Bed Mobility Overal bed mobility: Needs Assistance Bed Mobility: Supine to Sit;Sit to Supine     Supine to sit: Min guard;HOB elevated Sit to supine: Min guard   General bed mobility comments: Min guard for safety.  Transfers Overall  transfer level: Needs assistance Equipment used: 2 person hand held assist Transfers: Sit to/from Stand Sit to Stand: +2 safety/equipment;Min assist         General transfer comment: +2 person for safety. lifting assistance required for standing.    Balance Overall balance assessment: Needs assistance Sitting-balance support: Feet supported Sitting balance-Leahy Scale: Fair Sitting balance - Comments: no gross loss of balance. close stand by assistance to Min guard provided for safety due to impulsive behavior Postural control: Posterior lean;Right lateral lean Standing balance support: Single extremity supported Standing balance-Leahy Scale: Poor Standing balance comment: Min A required for safety to maintain standing balance, pt alternates UE support on/off sink/counter top.                           ADL either performed or assessed with clinical judgement   ADL Overall ADL's : Needs assistance/impaired Eating/Feeding: Set up;Sitting Eating/Feeding Details (indicate cue type and reason): to take drink from cup with straw/lid Grooming: Supervision/safety;Set up;Min guard;Minimal assistance;Standing;Cueing for sequencing;Cueing for safety;Oral care Grooming Details (indicate cue type and reason): using counter for support with MIN A +1 for standing balance from PT while OT provides safety and sequencing cues for pt to brush teeth, rinse mouth, and wipe mouth.                             Functional mobility during ADLs: Minimal assistance;Min guard (MIN A +1/CGA +1 to perform 1 step FWD to sink, 1 step BCKWD to bed and 1 side step to his right. Pt requires safety and  sequencing cues throughout.)       Vision Baseline Vision/History: Wears glasses Wears Glasses: At all times Patient Visual Report: No change from baseline     Perception     Praxis      Cognition Arousal/Alertness: Lethargic Behavior During Therapy: Impulsive;Flat affect Overall  Cognitive Status: No family/caregiver present to determine baseline cognitive functioning Area of Impairment: Following commands;Safety/judgement;Rancho level               Rancho Levels of Cognitive Functioning [place] Mirant Scales of Cognitive Functioning: Confused/appropriate       Following Commands: Follows one step commands with increased time Safety/Judgement: Decreased awareness of safety;Decreased awareness of deficits     General Comments: intermittently lethargic, oriented to place and self, but not situation or time. Requires MIN/MOD verbal/tactile cues throughout for safety/sequencing tasks/attention to task.        Exercises Other Exercises Other Exercises: OT facilitates pt particpation in standing ADLs with PT providing assist for safety with mobility including cueing pt while OT cues pt through safety/sequencing of grooming tasks at sink-side with MIN A/SUPV and SETUP for ADL items to be in reach   Shoulder Instructions       General Comments      Pertinent Vitals/ Pain       Pain Assessment: No/denies pain  Home Living                                          Prior Functioning/Environment              Frequency  Min 3X/week        Progress Toward Goals  OT Goals(current goals can now be found in the care plan section)  Progress towards OT goals: Progressing toward goals  Acute Rehab OT Goals Patient Stated Goal: to regain strength OT Goal Formulation: With family Time For Goal Achievement: 08/10/20 Potential to Achieve Goals: Good  Plan Discharge plan remains appropriate;Frequency remains appropriate    Co-evaluation    PT/OT/SLP Co-Evaluation/Treatment: Yes Reason for Co-Treatment: Complexity of the patient's impairments (multi-system involvement);Necessary to address cognition/behavior during functional activity;For patient/therapist safety;To address functional/ADL transfers PT goals addressed during  session: Mobility/safety with mobility;Strengthening/ROM OT goals addressed during session: ADL's and self-care      AM-PAC OT "6 Clicks" Daily Activity     Outcome Measure   Help from another person eating meals?: A Little Help from another person taking care of personal grooming?: A Little Help from another person toileting, which includes using toliet, bedpan, or urinal?: A Lot Help from another person bathing (including washing, rinsing, drying)?: A Lot Help from another person to put on and taking off regular upper body clothing?: A Little Help from another person to put on and taking off regular lower body clothing?: A Lot 6 Click Score: 15    End of Session Equipment Utilized During Treatment: Gait belt;Rolling walker;Oxygen  OT Visit Diagnosis: Other abnormalities of gait and mobility (R26.89);Muscle weakness (generalized) (M62.81)   Activity Tolerance Patient tolerated treatment well   Patient Left in bed;with call bell/phone within reach;with bed alarm set   Nurse Communication Mobility status        Time: 2620-3559 OT Time Calculation (min): 28 min  Charges: OT General Charges $OT Visit: 1 Visit OT Treatments $Self Care/Home Management : 8-22 mins  Rejeana Brock, MS, OTR/L ascom [phone] 07/31/20, 3:22 PM

## 2020-07-31 NOTE — Progress Notes (Signed)
Inpatient Rehab Admissions:  Inpatient Rehab Consult received.  Left voicemail for pt's mother, Clydie Braun, to discuss CIR recommendations.  Will continue to follow.   Signed: Estill Dooms, PT, DPT Admissions Coordinator [phone] 07/31/20  10:56 AM

## 2020-08-01 DIAGNOSIS — F10231 Alcohol dependence with withdrawal delirium: Secondary | ICD-10-CM | POA: Diagnosis not present

## 2020-08-01 LAB — GLUCOSE, CAPILLARY
Glucose-Capillary: 138 mg/dL — ABNORMAL HIGH (ref 70–99)
Glucose-Capillary: 90 mg/dL (ref 70–99)
Glucose-Capillary: 133 mg/dL — ABNORMAL HIGH (ref 70–99)
Glucose-Capillary: 103 mg/dL — ABNORMAL HIGH (ref 70–99)
Glucose-Capillary: 130 mg/dL — ABNORMAL HIGH (ref 70–99)

## 2020-08-01 LAB — BASIC METABOLIC PANEL
Anion gap: 10 (ref 5–15)
BUN: 9 mg/dL (ref 6–20)
CO2: 25 mmol/L (ref 22–32)
Calcium: 9 mg/dL (ref 8.9–10.3)
Chloride: 101 mmol/L (ref 98–111)
Creatinine, Ser: 0.66 mg/dL (ref 0.61–1.24)
GFR, Estimated: >60 mL/min (ref >60)
Glucose, Bld: 112 mg/dL — ABNORMAL HIGH (ref 70–99)
Potassium: 3.8 mmol/L (ref 3.5–5.1)
Sodium: 136 mmol/L (ref 135–145)

## 2020-08-01 LAB — CBC
HCT: 27.4 % — ABNORMAL LOW (ref 39.0–52.0)
Hemoglobin: 9.3 g/dL — ABNORMAL LOW (ref 13.0–17.0)
MCH: 33.7 pg (ref 26.0–34.0)
MCHC: 33.9 g/dL (ref 30.0–36.0)
MCV: 99.3 fL (ref 80.0–100.0)
Platelets: 340 10*3/uL (ref 150–400)
RBC: 2.76 MIL/uL — ABNORMAL LOW (ref 4.22–5.81)
RDW: 14.2 % (ref 11.5–15.5)
WBC: 8.1 10*3/uL (ref 4.0–10.5)
nRBC: 0 % (ref 0.0–0.2)

## 2020-08-01 LAB — MAGNESIUM: Magnesium: 2 mg/dL (ref 1.7–2.4)

## 2020-08-01 LAB — PHOSPHORUS: Phosphorus: 5.6 mg/dL — ABNORMAL HIGH (ref 2.5–4.6)

## 2020-08-01 MED ORDER — ADULT MULTIVITAMIN LIQUID CH
15.0000 mL | Freq: Every day | ORAL | Status: DC
  Administered 2020-08-01: 15 mL
  Filled 2020-08-01 (×3): qty 15

## 2020-08-01 MED ORDER — INSULIN ASPART 100 UNIT/ML ~~LOC~~ SOLN: 0.0000 [IU] | Freq: Three times a day (TID) | SUBCUTANEOUS | Status: DC

## 2020-08-01 MED ORDER — ENOXAPARIN SODIUM 80 MG/0.8ML ~~LOC~~ SOLN
0.5000 mg/kg | SUBCUTANEOUS | Status: DC
  Administered 2020-08-01 – 2020-08-03 (×3): 70 mg via SUBCUTANEOUS
  Filled 2020-08-01 (×4): qty 0.8

## 2020-08-01 MED ORDER — INSULIN ASPART 100 UNIT/ML ~~LOC~~ SOLN: 0.0000 [IU] | Freq: Every day | SUBCUTANEOUS | Status: DC

## 2020-08-01 NOTE — Progress Notes (Signed)
PHARMACY CONSULT NOTE  Pharmacy Consult for Electrolyte Monitoring and Replacement   Recent Labs: Potassium (mmol/L)  Date Value  08/01/2020 3.8  02/12/2013 3.9   Magnesium (mg/dL)  Date Value  77/37/3668 2.0   Calcium (mg/dL)  Date Value  15/94/7076 9.0   Calcium, Total (mg/dL)  Date Value  15/18/3437 8.7   Albumin (g/dL)  Date Value  35/78/9784 3.2 (L)  02/12/2013 3.6   Phosphorus (mg/dL)  Date Value  78/41/2820 5.6 (H)   Sodium (mmol/L)  Date Value  08/01/2020 136  02/12/2013 139   Assessment: 39 year old male here with encephalopathy s/t alcohol withdrawal.  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 Pharmacy to manage electrolytes. PEG tube/Trach  Goal of Therapy:  Electrolytes WNL  Plan:  K 3.8  Mag 2.0  Phos 5.6  Scr 0.66  No replacement at this time  Will continue to follow electrolytes   Sibley Rolison A 08/01/2020 7:40 AM

## 2020-08-01 NOTE — Consult Note (Signed)
Primo, Innis [id number] 05-08-1981 Cipriano Bunker, MD  Reason for Consult: Possible downsize of trach  HPI: This is a patient who underwent tracheostomy for failed extubation following prolonged intubation for multisubstance abuse and alcohol withdrawal.  Patient has been followed while in the hospital by speech therapy who saw him recently and recommend considering downsize the trach.  Surgery was performed by Dr. Andee Poles as primary surgeon and primary consult Dr. With me as assistant.  Allergies:  Allergies  Allergen Reactions  . Ondansetron Hives  . Promethazine Hives  . Penicillins Hives    Did it involve swelling of the face/tongue/throat, SOB, or low BP? Yes Did it involve sudden or severe rash/hives, skin peeling, or any reaction on the inside of your mouth or nose? Yes Did you need to seek medical attention at a hospital or doctor's office? Yes When did it last happen?childhood If all above answers are "NO", may proceed with cephalosporin use.  Patient tolerated Ancef administration well  . Zofran [Ondansetron Hcl] Hives    ROS: Review of systems normal other than 12 systems except per HPI.  PMH:  Past Medical History:  Diagnosis Date  . Alcohol abuse   . Anxiety   . Depression   . Drug abuse (HCC)   . GERD (gastroesophageal reflux disease)   . Hepatitis C   . Hypertension   . Pneumonia   . Pre-diabetes   . Sleep apnea     FH:  Family History  Problem Relation Age of Onset  . Hypertension Other     SH:  Social History   Socioeconomic History  . Marital status: Single    Spouse name: Not on file  . Number of children: Not on file  . Years of education: Not on file  . Highest education level: Not on file  Occupational History  . Occupation: unemployed  Tobacco Use  . Smoking status: Current Every Day Smoker  . Smokeless tobacco: Never Used  Vaping Use  . Vaping Use: Former  Substance and Sexual Activity  . Alcohol use: Yes    Comment: Pt  states that he usually drinks a pint of whiskey per day but has had none today.   . Drug use: Yes    Types: Marijuana  . Sexual activity: Yes    Birth control/protection: Condom  Other Topics Concern  . Not on file  Social History Narrative  . Not on file   Social Determinants of Health   Financial Resource Strain: Not on file  Food Insecurity: Not on file  Transportation Needs: Not on file  Physical Activity: Not on file  Stress: Not on file  Social Connections: Not on file  Intimate Partner Violence: Not on file    PSH:  Past Surgical History:  Procedure Laterality Date  . GASTROSTOMY W/ FEEDING TUBE Left 07/22/2020  . TRACHEOSTOMY TUBE PLACEMENT N/A 07/10/2020   Procedure: TRACHEOSTOMY;  Surgeon: Bud Face, MD;  Location: [facility] ORS;  Service: ENT;  Laterality: N/A;    Physical  Exam: Patient is awake and alert sitting up in the bed with his shirt off, obvious G-tube in position, trach in position.  He has a Passy-Muir valve placed on the #8 Shiley which is a cuffed Shiley although the cuff is not inflated.  He is having no difficulty with inspiration and no difficulty with expiration with an excellent voice and excellent movement of air around the trach.  The trach site is clean and dry.  The anterior nose is clear the  external ears appear normal remainder the neck exam is unremarkable.   A/P: Status post tracheostomy now with a #8 cuffed Shiley in position which is deflated.  Patient is having no difficulty with breathing around this #8 he has excellent voice there is no stridor.  As a general rule, for patients like him who are at high risk for readmission and possible reintubation, we routinely leave these trachs in position for possible reuse.  I will discuss with Dr. Andee Poles about his wishes to downsize the trach, however I suspect since he is having no difficulty breathing around this one that we would leave this #8 in until he is discharged and he can follow-up as an  outpatient.  If he remains stable and does not require readmission we can discuss downsizing at that time.  I discussed this with the patient who had a little bit of pressured speech and slurring of words who was discussing leaving AMA and going to another facility for his care.  I have advised him not to do that and to continue with his care here I will discuss with Dr. Andee Poles about possibly downsizing the trach however we will certainly follow him as an outpatient should he be discharged home.  There is no reason with proper teaching and trach care that he cannot be discharged with this trach in position as many patients are with outpatient followup.     Davina Poke 08/01/2020 5:39 PM

## 2020-08-01 NOTE — Progress Notes (Signed)
PROGRESS NOTE    Nicholas Valdez  ZMO:[id number] DOB: 31-Jan-1981 DOA: 06/19/2020 PCP: Oswaldo Conroy, MD   Brief Narrative:  This 39 years old male with PMH significant for EtOH and polysubstance abuse admitted with acute metabolic encephalopathy secondary to alcohol withdrawal and acute hypoxic respiratory failure due to aspiration pneumonia and encephalopathy.  Patient is s/p tracheostomy/PEG tube. Patient has prolonged ICU course, continues to have withdrawal symptoms with agitation requiring Precedex drip. Patient is much improved, alert and oriented x 2 , participates in conversation. PCCM pickup 12/24. Patient has participated with physical therapy.  PT recommended CIR.  Assessment & Plan:   Principal Problem:   Alcohol withdrawal delirium (HCC) Active Problems:   Increased ammonia level   Acute metabolic encephalopathy   Elevated LFTs   Tobacco abuse   Elevated lactic acid level   Acute respiratory failure with hypoxia (HCC)   Pressure injury of skin   Acute hypoxic respiratory failure secondary to acute encephalopathy due to alcohol withdrawal. Patient was intubated on arrival,  admitted in the ICU and had a prolonged ICU course. Patient remained intubated, was tried to extubate several times but was not successful. Patient had tracheostomy tube placed by ENT. Patient has episodes of agitation,  requiring Precedex drip. Patient is stable, PCCM pickup 12/24. Patient also found to have pneumonia, tracheal aspirate grew ElizabethKingia species. Infectious disease consulted, continued on antibiotics. Patient has completed 7 days of treatment for ElizabethKingia in endotracheal culture with concern for nosocomial pneumonia Continue bronchodilators as ordered. Aspiration pneumonia: Completed antibiotic course. Speech and swallow evaluation completed, recommended dysphagia 3 diet. Patient has been tolerating diet,  more comfortable. Speech recommeded potential downsize  of trach from a Shiley #8 to a Shiley #6 for better airflow around the trach for communication ease.  ENT Dr. Jenne Campus notified.  Acute Toxic Metabolic Encephalopathy: Continue supportive care. Continue scheduled oxycodone and Valium, fentanyl patch,  Seroquel increased to 100 mg twice daily Psych following, appreciate input CT head with no acute abnormality. Patient has intermittent periods of agitation and restlessness requires Haldol and Ativan as needed. Continued one-to-one sitter.  Hepatitis C: Continue treatment as  outpatient  Chronic anemia without s/sx of bleeding: Hemoglobin remains stable above 9.4 Consider transfusion if hemoglobin drops below 7.  Hyperglycemia: Regular insulin sliding scale.   DVT prophylaxis: Lovenox Code Status: Full code. Family Communication: No family at bedside. Disposition Plan:  Status is: Inpatient  Remains inpatient appropriate because:Inpatient level of care appropriate due to severity of illness   Dispo: The patient is from: Home              Anticipated d/c is to: CIR              Anticipated d/c date is: 2 days.              Patient currently is not medically stable to d/c.    Consultants:   PCCM, ENT, Psychiatry  Procedures: S/p tracheostomy, status post PEG tube. Antimicrobials:  Anti-infectives (From admission, onward)   Start     Dose/Rate Route Frequency Ordered Stop   07/21/20 1700  ciprofloxacin (CIPRO) IVPB 400 mg  Status:  Discontinued        400 mg 200 mL/hr over 60 Minutes Intravenous Every 12 hours 07/21/20 1543 07/27/20 1056   07/13/20 0800  minocycline (MINOCIN) capsule 100 mg       "Followed by" Linked Group Details   100 mg Per Tube 2 times daily 07/12/20 1305 07/18/20  1958   07/12/20 1400  minocycline (MINOCIN) capsule 200 mg       "Followed by" Linked Group Details   200 mg Per Tube  Once 07/12/20 1305 07/12/20 1447   07/06/20 2200  fluconazole (DIFLUCAN) tablet 100 mg  Status:  Discontinued         100 mg Per Tube Daily at bedtime 07/05/20 1525 07/12/20 0908   07/06/20 0400  anidulafungin (ERAXIS) 50 mg in sodium chloride 0.9 % 50 mL IVPB  Status:  Discontinued       "Followed by" Linked Group Details   50 mg 78 mL/hr over 50 Minutes Intravenous Every 24 hours 07/05/20 0313 07/05/20 1525   07/05/20 2200  fluconazole (DIFLUCAN) tablet 200 mg        200 mg Per Tube  Once 07/05/20 1525 07/05/20 2211   07/05/20 0400  anidulafungin (ERAXIS) 100 mg in sodium chloride 0.9 % 100 mL IVPB       "Followed by" Linked Group Details   100 mg 78 mL/hr over 100 Minutes Intravenous  Once 07/05/20 0313 07/05/20 0554   07/03/20 0500  Ampicillin-Sulbactam (UNASYN) 3 g in sodium chloride 0.9 % 100 mL IVPB  Status:  Discontinued        3 g 200 mL/hr over 30 Minutes Intravenous Every 6 hours 07/03/20 0349 07/07/20 1331   06/26/20 0000  vancomycin (VANCOREADY) IVPB 1750 mg/350 mL  Status:  Discontinued        1,750 mg 175 mL/hr over 120 Minutes Intravenous Every 24 hours 06/24/20 2330 06/27/20 1123   06/25/20 0015  vancomycin (VANCOREADY) IVPB 2000 mg/400 mL       "Followed by" Linked Group Details   2,000 mg 200 mL/hr over 120 Minutes Intravenous  Once 06/24/20 2325 06/25/20 0204   06/25/20 0015  vancomycin (VANCOREADY) IVPB 500 mg/100 mL       "Followed by" Linked Group Details   500 mg 100 mL/hr over 60 Minutes Intravenous  Once 06/24/20 2325 06/25/20 0105   06/21/20 1000  Ampicillin-Sulbactam (UNASYN) 3 g in sodium chloride 0.9 % 100 mL IVPB  Status:  Discontinued        3 g 200 mL/hr over 30 Minutes Intravenous Every 6 hours 06/21/20 0925 06/27/20 1123      Subjective: Patient was seen and examined at bedside.  Overnight events noted.   Patient appears tired and sleepy but he is alert,  awake and oriented x 2 Patient wants to be discharged.  Patient has tracheostomy tube and PEG tube.  Objective: Vitals:   08/01/20 0230 08/01/20 0351 08/01/20 0500 08/01/20 0751  BP:  113/73  104/64   Pulse:  (!) 110  (!) 107  Resp:  18    Temp:  98.3 F (36.8 C)  98.3 F (36.8 C)  TempSrc:  Oral  Oral  SpO2: 97% 97%  100%  Weight:   (!) 142.2 kg   Height:        Intake/Output Summary (Last 24 hours) at 08/01/2020 1406 Last data filed at 08/01/2020 1350 Gross per 24 hour  Intake 360 ml  Output 0 ml  Net 360 ml   Filed Weights   07/29/20 0600 07/31/20 0500 08/01/20 0500  Weight: (!) 139.8 kg (!) 139.6 kg (!) 142.2 kg    Examination:  General exam: Appears calm and comfortable , not in any distress, tracheostomy tube noted Respiratory system: Clear to auscultation. Respiratory effort normal. Cardiovascular system: S1 & S2 heard, RRR. No JVD, murmurs, rubs, gallops  or clicks. No pedal edema. Gastrointestinal system: Abdomen is nondistended, soft and nontender. No organomegaly or masses felt.  PEG tube noted.  No signs of infection.  Normal bowel sounds heard. Central nervous system: Alert and oriented. No focal neurological deficits. Extremities: No edema, no cyanosis, no clubbing. Skin: No rashes, lesions or ulcers Psychiatry: Judgement and insight appear normal. Mood & affect appropriate.     Data Reviewed: I have personally reviewed following labs and imaging studies  CBC: Recent Labs  Lab 07/28/20 0423 07/29/20 0444 07/30/20 0557 07/31/20 0535 08/01/20 0342  WBC 9.5 8.5 8.5 7.5 8.1  HGB 9.8* 10.5* 9.4* 9.4* 9.3*  HCT 29.9* 30.6* 28.3* 27.4* 27.4*  MCV 101.0* 98.1 98.3 98.6 99.3  PLT 325 311 313 311 340   Basic Metabolic Panel: Recent Labs  Lab 07/28/20 0423 07/29/20 0444 07/30/20 0557 07/31/20 0535 08/01/20 0342  NA 137 136 133* 134* 136  K 3.4* 3.8 3.7 3.2* 3.8  CL 100 100 97* 98 101  CO2 28 27 27 28 25   GLUCOSE 113* 110* 122* 121* 112*  BUN 24* 16 12 10 9   CREATININE 0.71 0.63 0.65 0.67 0.66  CALCIUM 8.9 9.0 9.3 8.9 9.0  MG 2.1 2.1 2.1 2.0 2.0  PHOS 4.9* 4.7* 4.8* 5.0* 5.6*   GFR: Estimated Creatinine Clearance: 180.1 mL/min (by C-G  formula based on SCr of 0.66 mg/dL). Liver Function Tests: Recent Labs  Lab 07/31/20 0535  AST 23  ALT 24  ALKPHOS 47  BILITOT 0.3  PROT 6.7  ALBUMIN 3.2*   No results for input(s): LIPASE, AMYLASE in the last 168 hours. No results for input(s): AMMONIA in the last 168 hours. Coagulation Profile: No results for input(s): INR, PROTIME in the last 168 hours. Cardiac Enzymes: No results for input(s): CKTOTAL, CKMB, CKMBINDEX, TROPONINI in the last 168 hours. BNP (last 3 results) No results for input(s): PROBNP in the last 8760 hours. HbA1C: No results for input(s): HGBA1C in the last 72 hours. CBG: Recent Labs  Lab 07/31/20 1940 07/31/20 2327 08/01/20 0446 08/01/20 0746 08/01/20 1116  GLUCAP 125* 114* 138* 90 133*   Lipid Profile: No results for input(s): CHOL, HDL, LDLCALC, TRIG, CHOLHDL, LDLDIRECT in the last 72 hours. Thyroid Function Tests: No results for input(s): TSH, T4TOTAL, FREET4, T3FREE, THYROIDAB in the last 72 hours. Anemia Panel: No results for input(s): VITAMINB12, FOLATE, FERRITIN, TIBC, IRON, RETICCTPCT in the last 72 hours. Sepsis Labs: No results for input(s): PROCALCITON, LATICACIDVEN in the last 168 hours.  No results found for this or any previous visit (from the past 240 hour(s)).   Radiology Studies: No results found.  Scheduled Meds: . chlorhexidine  15 mL Mouth Rinse BID  . docusate  100 mg Oral BID  . enoxaparin (LOVENOX) injection  0.5 mg/kg Subcutaneous Q24H  . feeding supplement  237 mL Oral TID BM  . feeding supplement (PROSource TF)  45 mL Per Tube TID  . fentaNYL  1 patch Transdermal Q72H  . folic acid  1 mg Oral Daily  . free water  60 mL Per Tube Q24H  . gabapentin  600 mg Oral Q8H  . insulin aspart  0-15 Units Subcutaneous TID WC  . insulin aspart  0-5 Units Subcutaneous QHS  . mouth rinse  15 mL Mouth Rinse q12n4p  . melatonin  5 mg Oral QHS  . multivitamin  15 mL Per Tube Daily  . nystatin   Topical BID  . oxyCODONE  5  mg Oral Q4H  .  pantoprazole sodium  40 mg Oral Daily  . polyethylene glycol  17 g Oral Daily  . polyvinyl alcohol  1 drop Both Eyes BID  . QUEtiapine  100 mg Oral BID  . scopolamine  1 patch Transdermal Q72H  . thiamine  100 mg Oral Daily  . valproic acid  500 mg Oral BID  . vitamin B-12  1,000 mcg Oral Daily   Continuous Infusions: . sodium chloride Stopped (07/27/20 0929)     LOS: 43 days    Time spent: 25 mins.    Cipriano Bunker, MD [facility]   If 7PM-7AM, please contact night-coverage

## 2020-08-01 NOTE — Progress Notes (Signed)
Pt asleep. Removed PMV. Sitter at bedside.

## 2020-08-01 NOTE — Progress Notes (Signed)
Patient removed IV. Per Dr. Lucianne Muss ok to not reinsert IV.   Madie Reno, RN

## 2020-08-01 NOTE — Progress Notes (Signed)
Inpatient Rehabilitation Admissions Coordinator                                        I await medical readiness and Cir bed availability to admit to CIR at [facility] in [place].  Ottie Glazier, RN, MSN Rehab Admissions Coordinator [phone] 08/01/2020 2:39 PM

## 2020-08-02 DIAGNOSIS — F10231 Alcohol dependence with withdrawal delirium: Secondary | ICD-10-CM | POA: Diagnosis not present

## 2020-08-02 LAB — CBC
HCT: 29.3 % — ABNORMAL LOW (ref 39.0–52.0)
Hemoglobin: 9.9 g/dL — ABNORMAL LOW (ref 13.0–17.0)
MCH: 33.6 pg (ref 26.0–34.0)
MCHC: 33.8 g/dL (ref 30.0–36.0)
MCV: 99.3 fL (ref 80.0–100.0)
Platelets: 372 10*3/uL (ref 150–400)
RBC: 2.95 MIL/uL — ABNORMAL LOW (ref 4.22–5.81)
RDW: 14.1 % (ref 11.5–15.5)
WBC: 8.8 10*3/uL (ref 4.0–10.5)
nRBC: 0 % (ref 0.0–0.2)

## 2020-08-02 LAB — GLUCOSE, CAPILLARY
Glucose-Capillary: 118 mg/dL — ABNORMAL HIGH (ref 70–99)
Glucose-Capillary: 106 mg/dL — ABNORMAL HIGH (ref 70–99)
Glucose-Capillary: 115 mg/dL — ABNORMAL HIGH (ref 70–99)

## 2020-08-02 LAB — BASIC METABOLIC PANEL
Anion gap: 10 (ref 5–15)
BUN: 10 mg/dL (ref 6–20)
CO2: 26 mmol/L (ref 22–32)
Calcium: 9.1 mg/dL (ref 8.9–10.3)
Chloride: 100 mmol/L (ref 98–111)
Creatinine, Ser: 0.73 mg/dL (ref 0.61–1.24)
GFR, Estimated: >60 mL/min (ref >60)
Glucose, Bld: 104 mg/dL — ABNORMAL HIGH (ref 70–99)
Potassium: 4.1 mmol/L (ref 3.5–5.1)
Sodium: 136 mmol/L (ref 135–145)

## 2020-08-02 LAB — PHOSPHORUS: Phosphorus: 5 mg/dL — ABNORMAL HIGH (ref 2.5–4.6)

## 2020-08-02 LAB — MAGNESIUM: Magnesium: 2.1 mg/dL (ref 1.7–2.4)

## 2020-08-02 MED ORDER — OXYCODONE HCL 5 MG PO TABS
5.0000 mg | ORAL_TABLET | Freq: Four times a day (QID) | ORAL | Status: DC | PRN
  Administered 2020-08-02 – 2020-08-04 (×6): 5 mg via ORAL
  Filled 2020-08-02 (×6): qty 1

## 2020-08-02 MED ORDER — PROSIGHT PO TABS: 1.0000 | ORAL_TABLET | Freq: Every day | ORAL | Status: DC

## 2020-08-02 MED ORDER — VALPROIC ACID 250 MG/5ML PO SOLN
500.0000 mg | Freq: Two times a day (BID) | ORAL | Status: DC
  Administered 2020-08-02 – 2020-08-04 (×4): 500 mg via ORAL
  Filled 2020-08-02 (×5): qty 10

## 2020-08-02 MED ORDER — GABAPENTIN 300 MG PO CAPS
600.0000 mg | ORAL_CAPSULE | Freq: Three times a day (TID) | ORAL | Status: DC
  Administered 2020-08-02 – 2020-08-04 (×6): 600 mg via ORAL
  Filled 2020-08-02 (×6): qty 2

## 2020-08-02 MED ORDER — DIVALPROEX SODIUM 500 MG PO DR TAB
500.0000 mg | DELAYED_RELEASE_TABLET | Freq: Two times a day (BID) | ORAL | Status: DC
  Filled 2020-08-02: qty 1

## 2020-08-02 MED ORDER — ADULT MULTIVITAMIN W/MINERALS CH
1.0000 | ORAL_TABLET | Freq: Every day | ORAL | Status: DC
  Administered 2020-08-03 – 2020-08-04 (×2): 1 via ORAL
  Filled 2020-08-02 (×2): qty 1

## 2020-08-02 NOTE — Progress Notes (Signed)
Occupational Therapy Treatment Patient Details Name: Nicholas Valdez MRN: [id number] DOB: [birth year] Today's Date: 08/02/2020    History of present illness Nicholas Valdez "Nicholas Valdez" is a 39 year old male with past medical history significant for EtOH and polysubstance abuse admitted with acute metabolic encephalopathy secondary to alcohol withdrawal and acute hypoxic respiratory failure due to encephalopathy and aspiration pneumonia.  Status post tracheostomy.   OT comments  Pt seen for OT tx this date to f/u re: safety with ADLs/ADL mobility. OT facilitates pt participation in standing ADLs with SETUP/SUPV/CGA to stand sink-side and complete face washing. Pt requires MOD verbal cues and MIN A for FWD chaning to complete LB dressing to don socks in sitting. Pt requires CGA with RW for fxl mobility and MIN/CGA +2 for ADL transfers for safety. OT engages pt in straight arm raises combined with sit to/from stand with MOD cues to sequence exercise for 1 set x10 reps alternating arms. OT engages pt in straight arm raises combined with sit to/from stand with MOD cues to sequence exercise for 1 set x10 reps alternating arms. Pt tolerates well. Left seated EOB with sitter in room, eating chocolate ice cream. Will continue to follow acutely. Continue to anticipate CIR is best recommendation for pt.     Follow Up Recommendations  CIR    Equipment Recommendations  3 in 1 bedside commode    Recommendations for Other Services      Precautions / Restrictions Precautions Precautions: Fall Precaution Comments: trach collar/Passy muir Restrictions Weight Bearing Restrictions: No       Mobility Bed Mobility Overal bed mobility: Modified Independent Bed Mobility: Supine to Sit;Sit to Supine     Supine to sit: Min guard;HOB elevated Sit to supine: Min guard   General bed mobility comments: Needs close supervision/CGA at times due to impulsivity  Transfers Overall transfer level: Needs  assistance Equipment used: None;Rolling walker (2 wheeled) Transfers: Sit to/from Stand Sit to Stand: +2 safety/equipment;Min guard;Supervision         General transfer comment: +2 person for safety. lifting assistance required for standing.    Balance Overall balance assessment: Needs assistance Sitting-balance support: Feet supported Sitting balance-Leahy Scale: Fair Sitting balance - Comments: no gross loss of balance. close stand by assistance to Min guard provided for safety due to impulsive behavior   Standing balance support: During functional activity;No upper extremity supported;Bilateral upper extremity supported Standing balance-Leahy Scale: Poor Standing balance comment: Poor balance without BUE support. has staggering L/R with ambulation. Improved to F with B UE support of RW                           ADL either performed or assessed with clinical judgement   ADL Overall ADL's : Needs assistance/impaired     Grooming: Wash/dry face;Supervision/safety;Set up;Min guard;Standing;Cueing for sequencing Grooming Details (indicate cue type and reason): sink-side with OT cueing for sequence of task and PT cueing pt for balance/safety.         Upper Body Dressing : Set up;Minimal assistance;Sitting   Lower Body Dressing: Minimal assistance;Sitting/lateral leans;Cueing for sequencing Lower Body Dressing Details (indicate cue type and reason): to don socks while seated EOB, needs FWD chaining to successfully complete task with OT completing first step and pt able to then better conceptualize subsequent steps of ADL task.             Functional mobility during ADLs: Min guard;Rolling walker (to complete lap around the unit. OT  cueing for posture/negotiating pathway while PT cueing pt for safe use of RW and balance.)       Vision Baseline Vision/History: Wears glasses Wears Glasses: At all times Patient Visual Report: No change from baseline      Perception     Praxis      Cognition Arousal/Alertness: Awake/alert Behavior During Therapy: Impulsive;WFL for tasks assessed/performed Overall Cognitive Status: Impaired/Different from baseline Area of Impairment: Following commands;Safety/judgement;Awareness;Problem solving;Orientation               Rancho Levels of Cognitive Functioning Rancho [place] Amigos Scales of Cognitive Functioning: Confused/appropriate Orientation Level: Time;Situation;Place;Disoriented to     Following Commands: Follows one step commands with increased time Safety/Judgement: Decreased awareness of safety;Decreased awareness of deficits Awareness: Intellectual Problem Solving: Slow processing;Decreased initiation;Difficulty sequencing;Requires verbal cues;Requires tactile cues General Comments: Pt is alert but easily distracted. Word salad in conversations. Requires re-direction throughout to attend to task.        Exercises Other Exercises Other Exercises: OT facilitates pt participation in standing ADLs with SETUP/SUPV/CGA to stand sink-side and complete face washing. Pt requires MOD verbal cues and MIN A for FWD chaning to complete LB dressing to don socks in sitting. Pt requires CGA with RW for fxl mobility and MIN/CGA +2 for ADL transfers for safety. Other Exercises: OT engages pt in straight arm raises combined with sit to/from stand with MOD cues to sequence exercise for 1 set x10 reps alternating arms.   Shoulder Instructions       General Comments pt HR up to 130s with activity which has been consistent for him in recent sessions.    Pertinent Vitals/ Pain       Pain Assessment: No/denies pain Pain Score: 0-No pain Faces Pain Scale: No hurt  Home Living                                          Prior Functioning/Environment              Frequency  Min 2X/week        Progress Toward Goals  OT Goals(current goals can now be found in the care plan  section)  Progress towards OT goals: Progressing toward goals  Acute Rehab OT Goals Patient Stated Goal: to regain strength OT Goal Formulation: With family Time For Goal Achievement: 08/10/20 Potential to Achieve Goals: Good  Plan Discharge plan remains appropriate;Frequency remains appropriate    Co-evaluation    PT/OT/SLP Co-Evaluation/Treatment: Yes Reason for Co-Treatment: Complexity of the patient's impairments (multi-system involvement);Necessary to address cognition/behavior during functional activity;For patient/therapist safety;To address functional/ADL transfers PT goals addressed during session: Mobility/safety with mobility;Balance;Proper use of DME;Strengthening/ROM OT goals addressed during session: ADL's and self-care;Strengthening/ROM      AM-PAC OT "6 Clicks" Daily Activity     Outcome Measure   Help from another person eating meals?: A Little Help from another person taking care of personal grooming?: A Little Help from another person toileting, which includes using toliet, bedpan, or urinal?: A Lot Help from another person bathing (including washing, rinsing, drying)?: A Lot Help from another person to put on and taking off regular upper body clothing?: A Little Help from another person to put on and taking off regular lower body clothing?: A Little 6 Click Score: 16    End of Session Equipment Utilized During Treatment: Gait belt;Rolling walker  OT Visit Diagnosis: Other abnormalities  of gait and mobility (R26.89);Muscle weakness (generalized) (M62.81)   Activity Tolerance Patient tolerated treatment well   Patient Left in bed;with call bell/phone within reach;with bed alarm set   Nurse Communication Mobility status        Time: 1530-1601 OT Time Calculation (min): 31 min  Charges: OT General Charges $OT Visit: 1 Visit OT Treatments $Self Care/Home Management : 8-22 mins  Rejeana Brock, MS, OTR/L ascom [phone] 08/02/20, 5:23 PM

## 2020-08-02 NOTE — TOC Progression Note (Signed)
Transition of Care [facility]) - Progression Note    Patient Details  Name: Nicholas Valdez MRN: [id number] Date of Birth: 30-Mar-1981  [facility]) CM/SW Contact  Chapman Fitch, RN Phone Number: 08/02/2020, 1:36 PM  Clinical Narrative:     Per CIR no bed available today. They confirm they can provide 1:1 safety sitter at [facility]  Expected Discharge Plan: Home/Self Care Barriers to Discharge: Continued Medical Work up,Active Substance Use - Placement  Expected Discharge Plan and Services Expected Discharge Plan: Home/Self Care In-house Referral: Clinical Social Work   Post Acute Care Choice: IP Rehab Living arrangements for the past 2 months: Single Family Home                                       Social Determinants of Health (SDOH) Interventions    Readmission Risk Interventions Readmission Risk Prevention Plan 12/16/2018  Transportation Screening Complete  PCP or Specialist Appt within 5-7 Days Complete  Home Care Screening Complete  Medication Review (RN CM) Complete  Some recent data might be hidden

## 2020-08-02 NOTE — H&P (Signed)
Physical Medicine and Rehabilitation Admission H&P    Chief Complaint  Patient presents with  . Altered Mental Status  . Alcohol Intoxication  : HPI: Nicholas Valdez is a 39 year old right-handed male with history significant for obesity with BMI 44.98, hepatitis C as well as alcohol/polysubstance /tobacco abuse.  History taken from chart review and patient.  Patient lives with parent and 83 year old daughter.  Independent prior to admission.  1 level home 2 steps to entry.  Independent at baseline.  He presented on 06/19/2020 to [facility] for inpatient detox but was found to be confused with nausea and was sent to the emergency department.  Patient was seen in the emergency department approximately 2 weeks prior for alcohol withdrawal and started on Librium protocol.  Cranial CT unremarkable for acute intracranial process.  Admission chemistries glucose 137 calcium 7.8, hemoglobin 13.3, alcohol 15, ammonia 76, lactic acid 3.3, CK 138.  He was emergently intubated for airway protection.  Patient with prolonged ICU course continued to have alcohol withdrawal agitation, requiring Precedex drip.  Required tracheostomy on 07/10/2020 per Dr. Bud Face with #8 cuffed Shiley placed as well as placement of gastrostomy tube for nutritional support on 07/22/2020 per Dr. Sterling Big.  Tracheal aspirate Elizabethkingia Meningoseptica and placed on broad-spectrum antibiotics which were completed on 08/01/2020. There is no current plan to downsize this patient remained with #8 Shiley trach as well as speech therapy working with Passy-Muir valve.  Patient has required sitters for agitation and restlessness and remains on Seroquel as well as Zyprexa/valproic acid.  He is tolerating a mechanical soft thin liquid diet.  Maintained on Lovenox for DVT prophylaxis.  Hospital course: Complicated by acute on chronic anemia with latest hemoglobin of 9.9 and chemistries unremarkable as of 08/02/2020.  Due to decreased  functional mobility and necessity for assistance with ADLs, patient was admitted due to patient decreasing functional mobility need for assistance for ADLs he was admitted for a comprehensive rehabilitation program.  Please see preadmission assessment from earlier today as well.  Review of Systems  Constitutional: Positive for malaise/fatigue. Negative for chills and fever.  HENT: Negative for hearing loss.   Eyes: Negative for blurred vision and double vision.  Respiratory: Positive for cough and shortness of breath.   Cardiovascular: Positive for chest pain (MSK) and leg swelling. Negative for palpitations.  Gastrointestinal: Positive for constipation. Negative for nausea and vomiting.       GERD  Genitourinary: Negative for dysuria, flank pain and hematuria.  Musculoskeletal: Positive for joint pain and myalgias.  Skin: Negative for rash.  Neurological: Positive for weakness. Negative for speech change and focal weakness.  Psychiatric/Behavioral: Positive for depression. The patient has insomnia.        Anxiety  All other systems reviewed and are negative.  Past Medical History:  Diagnosis Date  . Alcohol abuse   . Anxiety   . Depression   . Drug abuse (HCC)   . GERD (gastroesophageal reflux disease)   . Hepatitis C   . Hypertension   . Pneumonia   . Pre-diabetes   . Sleep apnea    Past Surgical History:  Procedure Laterality Date  . GASTROSTOMY W/ FEEDING TUBE Left 07/22/2020  . TRACHEOSTOMY TUBE PLACEMENT N/A 07/10/2020   Procedure: TRACHEOSTOMY;  Surgeon: Bud Face, MD;  Location: [facility] ORS;  Service: ENT;  Laterality: N/A;   Family History  Problem Relation Age of Onset  . Hypertension Other    Social History:  reports that he has been  smoking. He has never used smokeless tobacco. He reports current alcohol use. He reports current drug use. Drug: Marijuana. Allergies:  Allergies  Allergen Reactions  . Ondansetron Hives  . Penicillins Hives    Did it involve  swelling of the face/tongue/throat, SOB, or low BP? Yes Did it involve sudden or severe rash/hives, skin peeling, or any reaction on the inside of your mouth or nose? Yes Did you need to seek medical attention at a hospital or doctor's office? Yes When did it last happen?childhood If all above answers are "NO", may proceed with cephalosporin use.  Patient tolerated Ancef administration well  . Promethazine Hives   Medications Prior to Admission  Medication Sig Dispense Refill  . escitalopram (LEXAPRO) 20 MG tablet Take 20 mg by mouth daily.    Marland Kitchen gabapentin (NEURONTIN) 300 MG capsule Take 2 capsules (600 mg total) by mouth every 8 (eight) hours. (Patient taking differently: Take 300 mg by mouth 4 (four) times daily.)    . docusate (COLACE) 50 MG/5ML liquid Take 10 mLs (100 mg total) by mouth 2 (two) times daily. (Patient not taking: Reported on 08/04/2020) 100 mL 0  . feeding supplement (ENSURE ENLIVE / ENSURE PLUS) LIQD Take 237 mLs by mouth 3 (three) times daily between meals. 237 mL 12  . fentaNYL (DURAGESIC) 100 MCG/HR Place 1 patch onto the skin every 3 (three) days. 5 patch 0  . folic acid (FOLVITE) 1 MG tablet Take by mouth. (Patient not taking: Reported on 06/20/2020)    . [START ON 08/05/2020] folic acid (FOLVITE) 1 MG tablet Take 1 tablet (1 mg total) by mouth daily.    . haloperidol (HALDOL) 1 MG tablet Take 1 tablet (1 mg total) by mouth every 6 (six) hours as needed for agitation.    Marland Kitchen LORazepam (ATIVAN) 0.5 MG tablet Take 1 tablet (0.5 mg total) by mouth every 4 (four) hours as needed for anxiety. 30 tablet 0  . magnesium oxide (MAG-OX) 400 (241.3 Mg) MG tablet Take 1 tablet (400 mg total) by mouth 2 (two) times daily.    Melene Muller ON 08/05/2020] Multiple Vitamin (MULTIVITAMIN WITH MINERALS) TABS tablet Take 1 tablet by mouth daily.    . Nutritional Supplements (FEEDING SUPPLEMENT, PROSOURCE TF,) liquid Place 45 mLs into feeding tube 3 (three) times daily.    Marland Kitchen nystatin  (MYCOSTATIN/NYSTOP) powder Apply topically 2 (two) times daily. 15 g 0  . OLANZapine (ZYPREXA) 5 MG tablet Take 1 tablet (5 mg total) by mouth 4 (four) times daily as needed (agitation).    Marland Kitchen oxyCODONE (OXY IR/ROXICODONE) 5 MG immediate release tablet Take 1 tablet (5 mg total) by mouth every 6 (six) hours as needed for severe pain. 30 tablet 0  . [START ON 08/05/2020] pantoprazole sodium (PROTONIX) 40 mg/20 mL PACK Take 20 mLs (40 mg total) by mouth daily. 30 mL   . [START ON 08/05/2020] polyethylene glycol (MIRALAX / GLYCOLAX) 17 g packet Take 17 g by mouth daily. 14 each 0  . QUEtiapine (SEROQUEL) 100 MG tablet Take 1 tablet (100 mg total) by mouth 2 (two) times daily.    Marland Kitchen scopolamine (TRANSDERM-SCOP) 1 MG/3DAYS Place 1 patch (1.5 mg total) onto the skin every 3 (three) days. 10 patch 12  . Sofosbuvir-Velpatasvir 400-100 MG TABS Take 1 tablet by mouth daily.    Melene Muller ON 08/05/2020] thiamine 100 MG tablet Take 1 tablet (100 mg total) by mouth daily.    Marland Kitchen valproic acid (DEPAKENE) 250 MG/5ML solution Take 10 mLs (  500 mg total) by mouth 2 (two) times daily. 600 mL   . [START ON 08/05/2020] vitamin B-12 1000 MCG tablet Take 1 tablet (1,000 mcg total) by mouth daily.      Drug Regimen Review Drug regimen was reviewed and remains appropriate with no significant issues identified  Home: Home Living Family/patient expects to be discharged to:: Private residence Living Arrangements: Parent,Children Available Help at Discharge: Family,Available 24 hours/day Type of Home: House Home Access: Stairs to enter [employer] of Steps: 2 Entrance Stairs-Rails: Left,Right,Can reach both Home Layout: One level Bathroom Shower/Tub: Teacher, early years/pre: Standard Home Equipment: None Additional Comments: gathered from OT assessment, pt communicating mostly via writting   Functional History: Prior Function Level of Independence: Independent Comments: Per pt/mother, pt is totally  independent at baseline. Living with his parents and 35 y.o. daughter. Does not using AE for functional mobility.  Functional Status:  Mobility: Bed Mobility Overal bed mobility: Modified Independent Bed Mobility: Supine to Sit,Sit to Supine Supine to sit: Min guard,HOB elevated Sit to supine: Min guard General bed mobility comments: Needs close supervision/CGA at times due to impulsivity Transfers Overall transfer level: Needs assistance Equipment used: None Transfers: Sit to/from Stand Sit to Stand: Min guard General transfer comment: CGA due to pt's poor safety awarenss/impulsivity Ambulation/Gait Ambulation/Gait assistance: Min guard Gait Distance (Feet): 200 Feet (seated rest after 200 ft) Assistive device: None (gait belt) Gait Pattern/deviations: Staggering left,Staggering right General Gait Details: HR qyuickly elevated to 130s with ambulation Gait velocity: WNL Stairs: Yes Stairs assistance: Min guard Stair Management: One rail Right Number of Stairs: 4    ADL: ADL Overall ADL's : Needs assistance/impaired Eating/Feeding: Set up,Sitting Eating/Feeding Details (indicate cue type and reason): to take drink from cup with straw/lid Grooming: Wash/dry face,Supervision/safety,Set up,Min guard,Standing,Cueing for sequencing Grooming Details (indicate cue type and reason): sink-side with OT cueing for sequence of task and PT cueing pt for balance/safety. Upper Body Bathing: Sitting,Cueing for safety,Minimal assistance Lower Body Bathing: Moderate assistance,Sitting/lateral leans,Cueing for safety Upper Body Dressing : Set up,Minimal assistance,Sitting Lower Body Dressing: Minimal assistance,Sitting/lateral leans,Cueing for sequencing Lower Body Dressing Details (indicate cue type and reason): to don socks while seated EOB, needs FWD chaining to successfully complete task with OT completing first step and pt able to then better conceptualize subsequent steps of ADL  task. Toilet Transfer: Moderate assistance,BSC,Stand-pivot,+2 for safety/equipment Toileting- Clothing Manipulation and Hygiene: Sit to/from stand,Cueing for sequencing,Cueing for safety,Moderate assistance Functional mobility during ADLs: Min guard,Rolling walker (to complete lap around the unit. OT cueing for posture/negotiating pathway while PT cueing pt for safe use of RW and balance.) General ADL Comments: Functional mobility assessment limited, pt declines STS transfers at this time. Performs bed mobility with min A for BLE mgt. Variable assist to maintain seated balance at EOB, occasionally requiring +2 to maintain sitting balance.  Anticipate MOD A with +2 for safety/chair follow for functional mobility.  Cognition: Cognition Overall Cognitive Status: No family/caregiver present to determine baseline cognitive functioning Orientation Level: Oriented to person,Oriented to place,Disoriented to time,Disoriented to situation [employer] Scales of Cognitive Functioning: Confused/appropriate Cognition Arousal/Alertness: Awake/alert Behavior During Therapy: Impulsive,WFL for tasks assessed/performed Overall Cognitive Status: No family/caregiver present to determine baseline cognitive functioning Area of Impairment: Following commands,Safety/judgement,Awareness,Problem solving,Orientation Orientation Level: Time,Situation,Place,Disoriented to Following Commands: Follows one step commands with increased time Safety/Judgement: Decreased awareness of safety,Decreased awareness of deficits Awareness: Intellectual Problem Solving: Slow processing,Decreased initiation,Difficulty sequencing,Requires verbal cues,Requires tactile cues General Comments: Pt was A and cooperative. continues to  be impulsive at times throughout session. Difficult to assess due to: Tracheostomy  Physical Exam: Blood pressure (!) 148/87, pulse (!) 102, temperature 98.6 F (37 C), temperature source Oral, resp. rate  (!) 22, height 5\' 10"  (1.778 m), weight (!) 142.2 kg, SpO2 99 %. Physical Exam Vitals reviewed.  Constitutional:      General: He is not in acute distress.    Appearance: He is obese.  HENT:     Head: Normocephalic and atraumatic.     Right Ear: External ear normal.     Left Ear: External ear normal.     Nose: Nose normal.  Eyes:     General:        Right eye: No discharge.        Left eye: No discharge.     Extraocular Movements: Extraocular movements intact.  Cardiovascular:     Rate and Rhythm: Regular rhythm. Tachycardia present.  Pulmonary:     Breath sounds: Wheezing present.     Comments: Increased WOB Abdominal:     General: Abdomen is flat. Bowel sounds are normal. There is no distension.  Musculoskeletal:        General: No tenderness.     Cervical back: Normal range of motion and neck supple.     Comments: LE edema  Skin:    General: Skin is warm and dry.  Neurological:     Mental Status: He is alert.     Comments: Alert Motor: 4/5 throughout  Psychiatric:        Attention and Perception: Attention normal.        Mood and Affect: Mood normal.        Behavior: Behavior normal.     Results for orders placed or performed during the hospital encounter of 06/19/20 (from the past 48 hour(s))  Glucose, capillary     Status: Abnormal   Collection Time: 08/02/20  4:04 PM  Result Value Ref Range   Glucose-Capillary 115 (H) 70 - 99 mg/dL    Comment: Glucose reference range applies only to samples taken after fasting for at least 8 hours.  Magnesium     Status: None   Collection Time: 08/03/20  5:11 AM  Result Value Ref Range   Magnesium 1.8 1.7 - 2.4 mg/dL    Comment: Performed at [facility], [address] [place], [place]  Phosphorus     Status: Abnormal   Collection Time: 08/03/20  5:11 AM  Result Value Ref Range   Phosphorus 5.0 (H) 2.5 - 4.6 mg/dL    Comment: Performed at [facility], [address] [place], [place]  [place]  Basic metabolic panel     Status: Abnormal   Collection Time: 08/03/20  5:11 AM  Result Value Ref Range   Sodium 138 135 - 145 mmol/L   Potassium 4.0 3.5 - 5.1 mmol/L   Chloride 101 98 - 111 mmol/L   CO2 27 22 - 32 mmol/L   Glucose, Bld 119 (H) 70 - 99 mg/dL    Comment: Glucose reference range applies only to samples taken after fasting for at least 8 hours.   BUN 10 6 - 20 mg/dL   Creatinine, Ser 08/05/20 0.61 - 1.24 mg/dL   Calcium 8.9 8.9 - 3.38 mg/dL   GFR, Estimated 25.0 >53 mL/min    Comment: (NOTE) Calculated using the CKD-EPI Creatinine Equation (2021)    Anion gap 10 5 - 15    Comment: Performed at [facility],  [address] [place], [place] [address]  Magnesium     Status: None   Collection Time: 08/04/20  3:45 AM  Result Value Ref Range   Magnesium 2.0 1.7 - 2.4 mg/dL    Comment: Performed at [facility], [address] [place], [place] [address]  Phosphorus     Status: Abnormal   Collection Time: 08/04/20  3:45 AM  Result Value Ref Range   Phosphorus 5.5 (H) 2.5 - 4.6 mg/dL    Comment: Performed at [facility], [address] [place], [place] [address]  Basic metabolic panel     Status: Abnormal   Collection Time: 08/04/20  3:45 AM  Result Value Ref Range   Sodium 136 135 - 145 mmol/L   Potassium 3.8 3.5 - 5.1 mmol/L   Chloride 99 98 - 111 mmol/L   CO2 26 22 - 32 mmol/L   Glucose, Bld 139 (H) 70 - 99 mg/dL    Comment: Glucose reference range applies only to samples taken after fasting for at least 8 hours.   BUN 7 6 - 20 mg/dL   Creatinine, Ser 6.75 0.61 - 1.24 mg/dL   Calcium 8.8 (L) 8.9 - 10.3 mg/dL   GFR, Estimated >91 >63 mL/min    Comment: (NOTE) Calculated using the CKD-EPI Creatinine Equation (2021)    Anion gap 11 5 - 15    Comment: Performed at [facility], [address] [place], [place] [address]  Glucose, capillary     Status: Abnormal   Collection Time: 08/04/20  5:03 AM  Result Value Ref  Range   Glucose-Capillary 138 (H) 70 - 99 mg/dL    Comment: Glucose reference range applies only to samples taken after fasting for at least 8 hours.   No results found.     Medical Problem List and Plan: 1.  Acute hypoxic respiratory failure secondary to acute encephalopathy due to alcohol withdrawal  -patient may shower  -ELOS/Goals: 3-5 days/supervision/mod I  Admit to CIR 2.  Antithrombotics: -DVT/anticoagulation: Lovenox  -antiplatelet therapy: N/A 3. Pain Management: Neurontin 600 mg every 8 hours, Duragesic patch, oxycodone as needed  Monitor with increased exertion 4. Mood: Valproic acid 500 mg twice daily  -antipsychotic agents: Seroquel 100 mg twice daily Zyprexa 5 mg 4 times daily as needed, Haldol 1 mg every 6 as needed 5. Neuropsych: This patient is not capable of making decisions on his own behalf. 6. Skin/Wound Care: Routine skin checks 7. Fluids/Electrolytes/Nutrition: Routine in and outs 8.  Tracheostomy 07/10/2020 per Dr. Bud Face.  Currently with #8 cuffed Shiley with no plan to downsize and follow-up outpatient 9.  Gastrostomy tube 07/22/2020 per Dr. Sterling Big.  Patient currently on mechanical soft thin liquid diet. 10.  Obesity.  BMI 44.98.  Dietary follow-up.  Encourage weight loss 11.  History of tobacco/alcohol polysubstance abuse.  Counseled on appropriate. 12.  Acute on chronic anemia.    CBC ordered for tomorrow. 13.  Constipation.  MiraLAX daily 14.  Sleep disturbance  Melatonin 5 mg nightly  Charlton Amor, PA-C 08/04/2020  I have personally performed a face to face diagnostic evaluation, including, but not limited to relevant history and physical exam findings, of this patient and developed relevant assessment and plan.  Additionally, I have reviewed and concur with the physician assistant's documentation above.  Maryla Morrow, MD, ABPMR

## 2020-08-02 NOTE — Progress Notes (Signed)
PROGRESS NOTE    Nicholas Valdez  AST:[id number] DOB: [birth year] DOA: 06/19/2020 PCP: Oswaldo Conroy, MD   Chief Complain:Confusion  Brief Narrative: This 39 years old male with PMH significant for EtOH and polysubstance abuse admitted with acute metabolic encephalopathy secondary to alcohol withdrawal and acute hypoxic respiratory failure due to aspiration pneumonia and encephalopathy.  Patient is now s/p tracheostomy/PEG tube. Patient has prolonged ICU course, hospital course remarkable for withdrawal symptoms with agitation requiring Precedex drip. Patient has much improved, alert and oriented x 2 , participates in conversation.  Currently hemodynamically stable .transferred to [facility] service on 12/24. Patient is participating with physical therapy.  PT recommended CIR. currently waiting for CIR bed.  Assessment & Plan:   Principal Problem:   Alcohol withdrawal delirium (HCC) Active Problems:   Increased ammonia level   Acute metabolic encephalopathy   Elevated LFTs   Tobacco abuse   Elevated lactic acid level   Acute respiratory failure with hypoxia (HCC)   Pressure injury of skin   Acute hypoxic respiratory failure secondary to acute encephalopathy due to alcohol withdrawal. Patient was intubated on arrival,  admitted in the ICU and had a prolonged ICU course. Patient remained intubated, was tried to extubate several times but was not successful. Finally had  tracheostomy tube placed by ENT. Hospital course remarkable for episodes of agitation,  requiring Precedex drip. Patient also found to have pneumonia, tracheal aspirate grew ElizabethKingia species.Infectious disease consulted. Patient has completed 7 days of treatment for ElizabethKingia in endotracheal culture with concern for nosocomial pneumonia Speech and swallow evaluation completed, recommended dysphagia 3 diet. Patient has been tolerating diet,  more comfortable. Speech recommeded potential downsize of trach  from a Shiley #8 to a Shiley #6 for better airflow around the trach for communication ease.  ENT Dr. Jenne Campus notified.  ENT recommended to leave him on #8 on discharge, follow-up as an outpatient for reconsideration of downsizing. Currently hemodynamically stable.  Acute Toxic Metabolic Encephalopathy: Continue supportive care. Continue scheduled oxycodone and Valium, fentanyl patch,  Seroquel increased to 100 mg twice daily Psych following, appreciate input CT head with no acute abnormality. Patient has intermittent periods of agitation and restlessness requires Haldol and Ativan as needed. Continued one-to-one sitter.  Hepatitis C: We recommend treatment as  outpatient  Chronic anemia without s/sx of bleeding: Hemoglobin remains stable  Consider transfusion if hemoglobin drops below 7.  Hyperglycemia: Regular insulin sliding scale.  Monitor blood sugars  Disposition: PT/OT recommended SAR.  Patient waiting for CiR bed  Morbid obesity: BMI 44.9.  Nutrition Problem: Inadequate oral intake Etiology: inability to eat      DVT prophylaxis: Lovenox Code Status: Full Family Communication: None at bedside Status is: Inpatient  Remains inpatient appropriate because:Unsafe d/c plan   Dispo: The patient is from: Home              Anticipated d/c is to: CIR              Anticipated d/c date is: > 3 days              Patient currently is medically stable to d/c.    Consultants: PCCM,ID, ENT, psychiatry  Procedures: Tracheostomy, PEG  Antimicrobials:  Anti-infectives (From admission, onward)   Start     Dose/Rate Route Frequency Ordered Stop   07/21/20 1700  ciprofloxacin (CIPRO) IVPB 400 mg  Status:  Discontinued        400 mg 200 mL/hr over 60 Minutes Intravenous Every 12 hours  07/21/20 1543 07/27/20 1056   07/13/20 0800  minocycline (MINOCIN) capsule 100 mg       "Followed by" Linked Group Details   100 mg Per Tube 2 times daily 07/12/20 1305 07/18/20 1958    07/12/20 1400  minocycline (MINOCIN) capsule 200 mg       "Followed by" Linked Group Details   200 mg Per Tube  Once 07/12/20 1305 07/12/20 1447   07/06/20 2200  fluconazole (DIFLUCAN) tablet 100 mg  Status:  Discontinued        100 mg Per Tube Daily at bedtime 07/05/20 1525 07/12/20 0908   07/06/20 0400  anidulafungin (ERAXIS) 50 mg in sodium chloride 0.9 % 50 mL IVPB  Status:  Discontinued       "Followed by" Linked Group Details   50 mg 78 mL/hr over 50 Minutes Intravenous Every 24 hours 07/05/20 0313 07/05/20 1525   07/05/20 2200  fluconazole (DIFLUCAN) tablet 200 mg        200 mg Per Tube  Once 07/05/20 1525 07/05/20 2211   07/05/20 0400  anidulafungin (ERAXIS) 100 mg in sodium chloride 0.9 % 100 mL IVPB       "Followed by" Linked Group Details   100 mg 78 mL/hr over 100 Minutes Intravenous  Once 07/05/20 0313 07/05/20 0554   07/03/20 0500  Ampicillin-Sulbactam (UNASYN) 3 g in sodium chloride 0.9 % 100 mL IVPB  Status:  Discontinued        3 g 200 mL/hr over 30 Minutes Intravenous Every 6 hours 07/03/20 0349 07/07/20 1331   06/26/20 0000  vancomycin (VANCOREADY) IVPB 1750 mg/350 mL  Status:  Discontinued        1,750 mg 175 mL/hr over 120 Minutes Intravenous Every 24 hours 06/24/20 2330 06/27/20 1123   06/25/20 0015  vancomycin (VANCOREADY) IVPB 2000 mg/400 mL       "Followed by" Linked Group Details   2,000 mg 200 mL/hr over 120 Minutes Intravenous  Once 06/24/20 2325 06/25/20 0204   06/25/20 0015  vancomycin (VANCOREADY) IVPB 500 mg/100 mL       "Followed by" Linked Group Details   500 mg 100 mL/hr over 60 Minutes Intravenous  Once 06/24/20 2325 06/25/20 0105   06/21/20 1000  Ampicillin-Sulbactam (UNASYN) 3 g in sodium chloride 0.9 % 100 mL IVPB  Status:  Discontinued        3 g 200 mL/hr over 30 Minutes Intravenous Every 6 hours 06/21/20 0925 06/27/20 1123      Subjective:  Patient seen and examined at the bedside this morning.  Hemodynamically stable.  Sitter at the  bedside.  He was sleeping.  Did not wake up on upon calling his name.  Not in any kind of distress. Objective: Vitals:   08/01/20 0751 08/01/20 2002 08/02/20 0008 08/02/20 0817  BP: 104/64 121/83 (!) 119/59 (!) 100/58  Pulse: (!) 107 (!) 118 (!) 103 98  Resp:  20 19   Temp: 98.3 F (36.8 C) 98 F (36.7 C) 98 F (36.7 C) 97.9 F (36.6 C)  TempSrc: Oral Oral Oral   SpO2: 100% 99% 96% 97%  Weight:      Height:        Intake/Output Summary (Last 24 hours) at 08/02/2020 1117 Last data filed at 08/02/2020 1007 Gross per 24 hour  Intake 240 ml  Output --  Net 240 ml   Filed Weights   07/29/20 0600 07/31/20 0500 08/01/20 0500  Weight: (!) 139.8 kg (!) 139.6 kg (!) 142.2  kg    Examination:  General exam: Appears calm and comfortable ,Not in distress,sleeping HEENT:Trach Respiratory system: Bilateral equal air entry, normal vesicular breath sounds, no wheezes or crackles  Cardiovascular system: S1 & S2 heard, RRR. No JVD, murmurs, rubs, gallops or clicks. Trace bilateral  pedal edema. Gastrointestinal system: Abdomen is obese, soft and nontender. PEG Central nervous system: Sleeping Extremities:  no clubbing ,no cyanosis,  Skin: No rashes, lesions or ulcers,no icterus ,no pallor   Data Reviewed: I have personally reviewed following labs and imaging studies  CBC: Recent Labs  Lab 07/29/20 0444 07/30/20 0557 07/31/20 0535 08/01/20 0342 08/02/20 0348  WBC 8.5 8.5 7.5 8.1 8.8  HGB 10.5* 9.4* 9.4* 9.3* 9.9*  HCT 30.6* 28.3* 27.4* 27.4* 29.3*  MCV 98.1 98.3 98.6 99.3 99.3  PLT 311 313 311 340 372   Basic Metabolic Panel: Recent Labs  Lab 07/29/20 0444 07/30/20 0557 07/31/20 0535 08/01/20 0342 08/02/20 0348  NA 136 133* 134* 136 136  K 3.8 3.7 3.2* 3.8 4.1  CL 100 97* 98 101 100  CO2 27 27 28 25 26   GLUCOSE 110* 122* 121* 112* 104*  BUN 16 12 10 9 10   CREATININE 0.63 0.65 0.67 0.66 0.73  CALCIUM 9.0 9.3 8.9 9.0 9.1  MG 2.1 2.1 2.0 2.0 2.1  PHOS 4.7* 4.8* 5.0*  5.6* 5.0*   GFR: Estimated Creatinine Clearance: 180.1 mL/min (by C-G formula based on SCr of 0.73 mg/dL). Liver Function Tests: Recent Labs  Lab 07/31/20 0535  AST 23  ALT 24  ALKPHOS 47  BILITOT 0.3  PROT 6.7  ALBUMIN 3.2*   No results for input(s): LIPASE, AMYLASE in the last 168 hours. No results for input(s): AMMONIA in the last 168 hours. Coagulation Profile: No results for input(s): INR, PROTIME in the last 168 hours. Cardiac Enzymes: No results for input(s): CKTOTAL, CKMB, CKMBINDEX, TROPONINI in the last 168 hours. BNP (last 3 results) No results for input(s): PROBNP in the last 8760 hours. HbA1C: No results for input(s): HGBA1C in the last 72 hours. CBG: Recent Labs  Lab 08/01/20 1116 08/01/20 1608 08/01/20 2111 08/02/20 0724 08/02/20 1110  GLUCAP 133* 103* 130* 118* 106*   Lipid Profile: No results for input(s): CHOL, HDL, LDLCALC, TRIG, CHOLHDL, LDLDIRECT in the last 72 hours. Thyroid Function Tests: No results for input(s): TSH, T4TOTAL, FREET4, T3FREE, THYROIDAB in the last 72 hours. Anemia Panel: No results for input(s): VITAMINB12, FOLATE, FERRITIN, TIBC, IRON, RETICCTPCT in the last 72 hours. Sepsis Labs: No results for input(s): PROCALCITON, LATICACIDVEN in the last 168 hours.  No results found for this or any previous visit (from the past 240 hour(s)).       Radiology Studies: No results found.      Scheduled Meds: . chlorhexidine  15 mL Mouth Rinse BID  . docusate  100 mg Oral BID  . enoxaparin (LOVENOX) injection  0.5 mg/kg Subcutaneous Q24H  . feeding supplement  237 mL Oral TID BM  . feeding supplement (PROSource TF)  45 mL Per Tube TID  . fentaNYL  1 patch Transdermal Q72H  . folic acid  1 mg Oral Daily  . free water  60 mL Per Tube Q24H  . gabapentin  600 mg Oral Q8H  . insulin aspart  0-15 Units Subcutaneous TID WC  . insulin aspart  0-5 Units Subcutaneous QHS  . mouth rinse  15 mL Mouth Rinse q12n4p  . melatonin  5 mg  Oral QHS  . [START ON 08/03/2020]  multivitamin with minerals  1 tablet Oral Daily  . nystatin   Topical BID  . oxyCODONE  5 mg Oral Q4H  . pantoprazole sodium  40 mg Oral Daily  . polyethylene glycol  17 g Oral Daily  . polyvinyl alcohol  1 drop Both Eyes BID  . QUEtiapine  100 mg Oral BID  . scopolamine  1 patch Transdermal Q72H  . thiamine  100 mg Oral Daily  . valproic acid  500 mg Oral BID  . vitamin B-12  1,000 mcg Oral Daily   Continuous Infusions: . sodium chloride Stopped (07/27/20 0929)     LOS: 44 days    Time spent: 15  mins.More than 50% of that time was spent in counseling and/or coordination of care.      Burnadette Pop, MD [facility] P12/29/2021, 11:17 AM

## 2020-08-02 NOTE — Progress Notes (Signed)
Inpatient Rehabilitation Admissions Coordinator  Noted patient medically ready for discharge per Dr. Renford Dills. No CIR Bed available today for this patient . I will follow up tomorrow to clarify beds this week.  Ottie Glazier, RN, MSN Rehab Admissions Coordinator [phone] 08/02/2020 11:37 AM

## 2020-08-02 NOTE — Progress Notes (Signed)
Physical Therapy Treatment Patient Details Name: Nicholas Valdez MRN: [id number] DOB: [birth year] Today's Date: 08/02/2020    History of Present Illness Nicholas Spence "[name]" is a 39 year old male with past medical history significant for EtOH and polysubstance abuse admitted with acute metabolic encephalopathy secondary to alcohol withdrawal and acute hypoxic respiratory failure due to encephalopathy and aspiration pneumonia.  Status post tracheostomy.    PT Comments    PT/OT co treat 2/2 to pt's impulsivity and +2 for safety. Pt lethargic at first however becomes more alert/awake during session. He is oriented to self. Was sitting EOB with sitter in room upon arriving. Was able to STS > 10 x from EOB during session. Pt ambulated ~ 50 ft without AD with unsteadiness noted. Recommend use of AD at all times until pt's balance/safety awareness improves. HR elevation to mid 130s with ambulation however pt does not endorse fatigue. Overall pt tolerated session well. Will greatly benefit from CIR at DC to address deficits while assisting pt to PLOF.    Follow Up Recommendations  CIR     Equipment Recommendations  Other (comment) (defer to next level of care)    Recommendations for Other Services       Precautions / Restrictions Precautions Precautions: Fall Precaution Comments: trach collar/Passy muir Restrictions Weight Bearing Restrictions: No    Mobility  Bed Mobility Overal bed mobility: Modified Independent      General bed mobility comments: Needs close supervision/CGA at times due to impulsivity  Transfers Overall transfer level: Needs assistance Equipment used: None;Rolling walker (2 wheeled) Transfers: Sit to/from Stand Sit to Stand: +2 safety/equipment;Min guard;Supervision     Ambulation/Gait Ambulation/Gait assistance: Min guard Gait Distance (Feet): 160 Feet Assistive device: Rolling walker (2 wheeled);None Gait Pattern/deviations: Staggering  left;Staggering right Gait velocity: WNL   General Gait Details: Pt ambulated ~ 50 ft without AD with very unsteady gait kinematics. Ambulated ~ 120 ft ith RW without LOB however still has impulsivity that makes him fall risk.      Balance Overall balance assessment: Needs assistance Sitting-balance support: Feet supported Sitting balance-Leahy Scale: Fair     Standing balance support: During functional activity;No upper extremity supported;Bilateral upper extremity supported Standing balance-Leahy Scale: Poor Standing balance comment: Poor balance without BUE support. has staggering L/R with ambulation         Cognition Arousal/Alertness: Awake/alert Behavior During Therapy: Impulsive;WFL for tasks assessed/performed Overall Cognitive Status: Impaired/Different from baseline Area of Impairment: Following commands;Safety/judgement;Awareness;Problem solving;Orientation        Orientation Level: Time;Situation;Place;Disoriented to     Following Commands: Follows one step commands with increased time Safety/Judgement: Decreased awareness of safety;Decreased awareness of deficits Awareness: Intellectual Problem Solving: Slow processing;Decreased initiation;Difficulty sequencing;Requires verbal cues;Requires tactile cues General Comments: Pt is alert but easily distracted.             Pertinent Vitals/Pain Pain Assessment: No/denies pain Pain Score: 0-No pain Faces Pain Scale: No hurt           PT Goals (current goals can now be found in the care plan section) Acute Rehab PT Goals Patient Stated Goal: to regain strength Progress towards PT goals: Progressing toward goals    Frequency    Min 2X/week      PT Plan Current plan remains appropriate    Co-evaluation PT/OT/SLP Co-Evaluation/Treatment: Yes   PT goals addressed during session: Balance;Mobility/safety with mobility;Proper use of DME;Strengthening/ROM        AM-PAC PT "6 Clicks" Mobility    Outcome Measure  Help needed turning from your back to your side while in a flat bed without using bedrails?: A Little Help needed moving from lying on your back to sitting on the side of a flat bed without using bedrails?: A Little Help needed moving to and from a bed to a chair (including a wheelchair)?: Total Help needed standing up from a chair using your arms (e.g., wheelchair or bedside chair)?: Total Help needed to walk in hospital room?: Total Help needed climbing 3-5 steps with a railing? : Total 6 Click Score: 10    End of Session Equipment Utilized During Treatment: Gait belt Activity Tolerance: Patient tolerated treatment well Patient left: in bed;with call bell/phone within reach;with nursing/sitter in room Nurse Communication: Mobility status PT Visit Diagnosis: Muscle weakness (generalized) (M62.81);Difficulty in walking, not elsewhere classified (R26.2);Unsteadiness on feet (R26.81)     Time: 1530-1600 PT Time Calculation (min) (ACUTE ONLY): 30 min  Charges:  $Gait Training: 8-22 mins                     Jetta Lout PTA 08/02/20, 4:29 PM

## 2020-08-02 NOTE — Progress Notes (Signed)
PHARMACY CONSULT NOTE  Pharmacy Consult for Electrolyte Monitoring and Replacement   Recent Labs: Potassium (mmol/L)  Date Value  08/02/2020 4.1  02/12/2013 3.9   Magnesium (mg/dL)  Date Value  54/65/0354 2.1   Calcium (mg/dL)  Date Value  65/68/1275 9.1   Calcium, Total (mg/dL)  Date Value  17/00/1749 8.7   Albumin (g/dL)  Date Value  44/96/7591 3.2 (L)  02/12/2013 3.6   Phosphorus (mg/dL)  Date Value  63/84/6659 5.0 (H)   Sodium (mmol/L)  Date Value  08/02/2020 136  02/12/2013 139   Assessment: 39 year old male here with encephalopathy s/t alcohol withdrawal.  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 Pharmacy to manage electrolytes.  Nutrition: dysphagia 3 diet, Ensure TID, ProSource TID  Goal of Therapy:  Electrolytes WNL  Plan:   No replacement at this time  Will continue to follow electrolytes   Lowella Bandy 08/02/2020 7:05 AM

## 2020-08-03 DIAGNOSIS — F10231 Alcohol dependence with withdrawal delirium: Secondary | ICD-10-CM | POA: Diagnosis not present

## 2020-08-03 LAB — BASIC METABOLIC PANEL
Anion gap: 10 (ref 5–15)
BUN: 10 mg/dL (ref 6–20)
CO2: 27 mmol/L (ref 22–32)
Calcium: 8.9 mg/dL (ref 8.9–10.3)
Chloride: 101 mmol/L (ref 98–111)
Creatinine, Ser: 0.76 mg/dL (ref 0.61–1.24)
GFR, Estimated: >60 mL/min (ref >60)
Glucose, Bld: 119 mg/dL — ABNORMAL HIGH (ref 70–99)
Potassium: 4 mmol/L (ref 3.5–5.1)
Sodium: 138 mmol/L (ref 135–145)

## 2020-08-03 LAB — MAGNESIUM: Magnesium: 1.8 mg/dL (ref 1.7–2.4)

## 2020-08-03 LAB — PHOSPHORUS: Phosphorus: 5 mg/dL — ABNORMAL HIGH (ref 2.5–4.6)

## 2020-08-03 MED ORDER — MAGNESIUM SULFATE 2 GM/50ML IV SOLN
2.0000 g | Freq: Once | INTRAVENOUS | Status: DC
  Filled 2020-08-03: qty 50

## 2020-08-03 MED ORDER — MAGNESIUM OXIDE 400 (241.3 MG) MG PO TABS
400.0000 mg | ORAL_TABLET | Freq: Two times a day (BID) | ORAL | Status: DC
  Administered 2020-08-03 – 2020-08-04 (×3): 400 mg via ORAL
  Filled 2020-08-03 (×3): qty 1

## 2020-08-03 MED ORDER — LORAZEPAM 0.5 MG PO TABS
0.5000 mg | ORAL_TABLET | ORAL | Status: DC | PRN
  Administered 2020-08-03 (×2): 0.5 mg via ORAL
  Filled 2020-08-03 (×2): qty 1

## 2020-08-03 NOTE — Progress Notes (Addendum)
Pt oriented to self. Flight of ideas noted. Confused and forgetful. Follows simple commands. Talkative. Anxious at times. Trach in place, capped all night. No distress noted. Sitter at bedside for safety. Able to stand/ambulate to room by holding onto bed. Pt able to eat and take pills without problems with good appetite. PEG in place, intact. Prn pain med adm per MD ordered. Safety measures in place. Will continue to monitor.

## 2020-08-03 NOTE — Progress Notes (Signed)
Nutrition Follow-up  DOCUMENTATION CODES:   Morbid obesity  INTERVENTION:   Ensure Enlive po TID, each supplement provides 350 kcal and 20 grams of protein  Pro-Source 66ml TID via tube, provides 40kcal and 11g of protein per serving   MVI daily   Free water flushes via G-tube- 20ml q24 hours to maintain tube patency   NUTRITION DIAGNOSIS:   Inadequate oral intake related to inability to eat as evidenced by NPO status. -resolved  GOAL:   Patient will meet greater than or equal to 90% of their needs -progressing   MONITOR:   PO intake,Supplement acceptance,Labs,Weight trends,Skin,I & O's  ASSESSMENT:   39 year old male with PMHx of EtOH abuse, polysubstance abuse, hepatitis C admitted with AMS, acute encephalopathy, EtOH withdrawal, suspected aspiration PNA, AKI, sepsis.  Pt s/p tracheostomy tube placement 12/6 Pt s/p surgical placed G-tube 12/18  Pt continues to have fairly good appetite and oral intake in hospital; pt eating anywhere from 30-100% of meals and is drinking Ensure supplements. Pt is receiving ProSource via his G-tube to help him meet his protein needs. Per chart, pt has remained weight stable over the past 2 weeks. Pt is still having sme intermittent confusion. Plan is for CIR at discharge.   Medications reviewed and include: colace, lovenox, folic acid, melatonin, Mg oxide, MVI, protonix, miralax, thiamine, B12  Labs reviewed: K 4.0 wnl, P 5.0(H), Mg 1.8 wnl Hgb 9.9(L), Hct 29.3(L)  Diet Order:   Diet Order            DIET DYS 3 Room service appropriate? Yes with Assist; Fluid consistency: Thin  Diet effective now                EDUCATION NEEDS:   No education needs have been identified at this time  Skin:  closed incision to neck, L heel wound   Last BM:  12/29- type 4  Height:   Ht Readings from Last 1 Encounters:  07/19/20 5\' 10"  (1.778 m)   Weight:   Wt Readings from Last 1 Encounters:  08/01/20 (!) 142.2 kg   Ideal Body  Weight:  75.5 kg  BMI:  Body mass index is 44.98 kg/m.  Estimated Nutritional Needs:   Kcal:  2700-3000kcal/day  Protein:  >135g/day  Fluid:  >/= 2.2 L/day  08/03/20 MS, RD, LDN Please refer to [facility] for RD and/or RD on-call/weekend/after hours pager

## 2020-08-03 NOTE — Progress Notes (Signed)
PHARMACY CONSULT NOTE  Pharmacy Consult for Electrolyte Monitoring and Replacement   Recent Labs: Potassium (mmol/L)  Date Value  08/03/2020 4.0  02/12/2013 3.9   Magnesium (mg/dL)  Date Value  04/88/8916 1.8   Calcium (mg/dL)  Date Value  94/50/3888 8.9   Calcium, Total (mg/dL)  Date Value  28/00/3491 8.7   Albumin (g/dL)  Date Value  79/15/0569 3.2 (L)  02/12/2013 3.6   Phosphorus (mg/dL)  Date Value  79/48/0165 5.0 (H)   Sodium (mmol/L)  Date Value  08/03/2020 138  02/12/2013 139   Assessment: 39 year old male here with encephalopathy s/t alcohol withdrawal.  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 Pharmacy to manage electrolytes.  Nutrition: dysphagia 3 diet, Ensure TID, ProSource TID  Goal of Therapy:  Electrolytes WNL  Plan:   Start 400 mg oral magnesium oxide (no IV access)  Will continue to follow electrolytes   Nicholas Valdez 08/03/2020 7:53 AM

## 2020-08-03 NOTE — Progress Notes (Signed)
PROGRESS NOTE    [name]  TIW:[id number] DOB: 07/01/1981 DOA: 06/19/2020 PCP: Oswaldo Conroy, MD   Chief Complain:Confusion  Brief Narrative: This 39 years old male with PMH significant for EtOH and polysubstance abuse admitted with acute metabolic encephalopathy secondary to alcohol withdrawal and acute hypoxic respiratory failure due to aspiration pneumonia and encephalopathy.  Patient is now s/p tracheostomy/PEG tube. Patient has prolonged ICU course, hospital course remarkable for withdrawal symptoms with agitation requiring Precedex drip. Patient has much improved, alert and oriented x 2 , participates in conversation.  Currently hemodynamically stable .transferred to [facility] service on 12/24. Patient is participating with physical therapy.  PT recommended CIR. currently waiting for CIR bed.  Assessment & Plan:   Principal Problem:   Alcohol withdrawal delirium (HCC) Active Problems:   Increased ammonia level   Acute metabolic encephalopathy   Elevated LFTs   Tobacco abuse   Elevated lactic acid level   Acute respiratory failure with hypoxia (HCC)   Pressure injury of skin   Acute hypoxic respiratory failure secondary to acute encephalopathy due to alcohol withdrawal. Patient was intubated on arrival,  admitted in the ICU and had a prolonged ICU course. Patient remained intubated, was tried to extubate several times but was not successful. Finally had  tracheostomy tube placed by ENT. Hospital course remarkable for episodes of agitation,  requiring Precedex drip. Patient also found to have pneumonia, tracheal aspirate grew ElizabethKingia species.Infectious disease consulted. Patient has completed 7 days of treatment for ElizabethKingia in endotracheal culture with concern for nosocomial pneumonia Speech and swallow evaluation completed, recommended dysphagia 3 diet. Patient has been tolerating diet,  more comfortable. Speech recommeded potential downsize of trach  from a Shiley #8 to a Shiley #6 for better airflow around the trach for communication ease.  ENT Dr. Jenne Campus notified.  ENT recommended to leave him on #8 on discharge, follow-up as an outpatient for reconsideration of downsizing. Currently hemodynamically stable.  Acute Toxic Metabolic Encephalopathy: Continue supportive care. Continue scheduled oxycodone and Valium, fentanyl patch,  Seroquel increased to 100 mg twice daily Psych following, appreciate input CT head with no acute abnormality. Patient has intermittent periods of agitation and restlessness requires Haldol and Ativan as needed. Continued one-to-one sitter.  Hepatitis C: We recommend treatment as  outpatient  Chronic anemia without s/sx of bleeding: Hemoglobin remains stable  Consider transfusion if hemoglobin drops below 7.  Hyperglycemia: Regular insulin sliding scale.  Monitor blood sugars  Disposition: PT/OT recommended CIR.  Patient waiting for CiR bed  Morbid obesity: BMI 44.9.  Nutrition Problem: Inadequate oral intake Etiology: inability to eat      DVT prophylaxis: Lovenox Code Status: Full Family Communication: None at bedside Status is: Inpatient  Remains inpatient appropriate because:Unsafe d/c plan   Dispo: The patient is from: Home              Anticipated d/c is to: CIR              Anticipated d/c date is: As soon as bed is available              Patient currently is medically stable to d/c.    Consultants: PCCM,ID, ENT, psychiatry  Procedures: Tracheostomy, PEG  Antimicrobials:  Anti-infectives (From admission, onward)   Start     Dose/Rate Route Frequency Ordered Stop   07/21/20 1700  ciprofloxacin (CIPRO) IVPB 400 mg  Status:  Discontinued        400 mg 200 mL/hr over 60 Minutes Intravenous  Every 12 hours 07/21/20 1543 07/27/20 1056   07/13/20 0800  minocycline (MINOCIN) capsule 100 mg       "Followed by" Linked Group Details   100 mg Per Tube 2 times daily 07/12/20 1305  07/18/20 1958   07/12/20 1400  minocycline (MINOCIN) capsule 200 mg       "Followed by" Linked Group Details   200 mg Per Tube  Once 07/12/20 1305 07/12/20 1447   07/06/20 2200  fluconazole (DIFLUCAN) tablet 100 mg  Status:  Discontinued        100 mg Per Tube Daily at bedtime 07/05/20 1525 07/12/20 0908   07/06/20 0400  anidulafungin (ERAXIS) 50 mg in sodium chloride 0.9 % 50 mL IVPB  Status:  Discontinued       "Followed by" Linked Group Details   50 mg 78 mL/hr over 50 Minutes Intravenous Every 24 hours 07/05/20 0313 07/05/20 1525   07/05/20 2200  fluconazole (DIFLUCAN) tablet 200 mg        200 mg Per Tube  Once 07/05/20 1525 07/05/20 2211   07/05/20 0400  anidulafungin (ERAXIS) 100 mg in sodium chloride 0.9 % 100 mL IVPB       "Followed by" Linked Group Details   100 mg 78 mL/hr over 100 Minutes Intravenous  Once 07/05/20 0313 07/05/20 0554   07/03/20 0500  Ampicillin-Sulbactam (UNASYN) 3 g in sodium chloride 0.9 % 100 mL IVPB  Status:  Discontinued        3 g 200 mL/hr over 30 Minutes Intravenous Every 6 hours 07/03/20 0349 07/07/20 1331   06/26/20 0000  vancomycin (VANCOREADY) IVPB 1750 mg/350 mL  Status:  Discontinued        1,750 mg 175 mL/hr over 120 Minutes Intravenous Every 24 hours 06/24/20 2330 06/27/20 1123   06/25/20 0015  vancomycin (VANCOREADY) IVPB 2000 mg/400 mL       "Followed by" Linked Group Details   2,000 mg 200 mL/hr over 120 Minutes Intravenous  Once 06/24/20 2325 06/25/20 0204   06/25/20 0015  vancomycin (VANCOREADY) IVPB 500 mg/100 mL       "Followed by" Linked Group Details   500 mg 100 mL/hr over 60 Minutes Intravenous  Once 06/24/20 2325 06/25/20 0105   06/21/20 1000  Ampicillin-Sulbactam (UNASYN) 3 g in sodium chloride 0.9 % 100 mL IVPB  Status:  Discontinued        3 g 200 mL/hr over 30 Minutes Intravenous Every 6 hours 06/21/20 0925 06/27/20 1123      Subjective:  Patient seen and examined at the bedside this morning.  Hemodynamically stable.   Alert, awake and comfortable today.  Sitting on the bed.  Denies any complaints  Objective: Vitals:   08/02/20 1223 08/02/20 1503 08/02/20 2014 08/03/20 0411  BP: (!) 124/98 (!) 104/58 (!) 156/102 134/89  Pulse: 100 (!) 101 (!) 108 (!) 118  Resp:   20 18  Temp: 97.7 F (36.5 C) 97.8 F (36.6 C) 99.7 F (37.6 C) 98.3 F (36.8 C)  TempSrc:   Oral Oral  SpO2: 96% 97% 100% 94%  Weight:      Height:        Intake/Output Summary (Last 24 hours) at 08/03/2020 0746 Last data filed at 08/02/2020 1857 Gross per 24 hour  Intake 480 ml  Output --  Net 480 ml   Filed Weights   07/29/20 0600 07/31/20 0500 08/01/20 0500  Weight: (!) 139.8 kg (!) 139.6 kg (!) 142.2 kg    Examination:  General exam: Comfortable, morbidly obese  HEENT:Trach Respiratory system: Bilateral equal air entry, normal vesicular breath sounds, no wheezes or crackles  Cardiovascular system: S1 & S2 heard, RRR. No JVD, murmurs, rubs, gallops or clicks. Gastrointestinal system: Abdomen is nondistended, soft and nontender. No organomegaly or masses felt. Normal bowel sounds heard. Central nervous system: Alert and oriented. No focal neurological deficits. Extremities: No edema, no clubbing ,no cyanosis Go Separate message sent with thiamine follow-up Nimish thousand skin: No rashes, lesions or ulcers,no icterus ,no pallor   Data Reviewed: I have personally reviewed following labs and imaging studies  CBC: Recent Labs  Lab 07/29/20 0444 07/30/20 0557 07/31/20 0535 08/01/20 0342 08/02/20 0348  WBC 8.5 8.5 7.5 8.1 8.8  HGB 10.5* 9.4* 9.4* 9.3* 9.9*  HCT 30.6* 28.3* 27.4* 27.4* 29.3*  MCV 98.1 98.3 98.6 99.3 99.3  PLT 311 313 311 340 372   Basic Metabolic Panel: Recent Labs  Lab 07/30/20 0557 07/31/20 0535 08/01/20 0342 08/02/20 0348 08/03/20 0511  NA 133* 134* 136 136 138  K 3.7 3.2* 3.8 4.1 4.0  CL 97* 98 101 100 101  CO2 27 28 25 26 27   GLUCOSE 122* 121* 112* 104* 119*  BUN 12 10 9 10 10    CREATININE 0.65 0.67 0.66 0.73 0.76  CALCIUM 9.3 8.9 9.0 9.1 8.9  MG 2.1 2.0 2.0 2.1 1.8  PHOS 4.8* 5.0* 5.6* 5.0* 5.0*   GFR: Estimated Creatinine Clearance: 180.1 mL/min (by C-G formula based on SCr of 0.76 mg/dL). Liver Function Tests: Recent Labs  Lab 07/31/20 0535  AST 23  ALT 24  ALKPHOS 47  BILITOT 0.3  PROT 6.7  ALBUMIN 3.2*   No results for input(s): LIPASE, AMYLASE in the last 168 hours. No results for input(s): AMMONIA in the last 168 hours. Coagulation Profile: No results for input(s): INR, PROTIME in the last 168 hours. Cardiac Enzymes: No results for input(s): CKTOTAL, CKMB, CKMBINDEX, TROPONINI in the last 168 hours. BNP (last 3 results) No results for input(s): PROBNP in the last 8760 hours. HbA1C: No results for input(s): HGBA1C in the last 72 hours. CBG: Recent Labs  Lab 08/01/20 1608 08/01/20 2111 08/02/20 0724 08/02/20 1110 08/02/20 1604  GLUCAP 103* 130* 118* 106* 115*   Lipid Profile: No results for input(s): CHOL, HDL, LDLCALC, TRIG, CHOLHDL, LDLDIRECT in the last 72 hours. Thyroid Function Tests: No results for input(s): TSH, T4TOTAL, FREET4, T3FREE, THYROIDAB in the last 72 hours. Anemia Panel: No results for input(s): VITAMINB12, FOLATE, FERRITIN, TIBC, IRON, RETICCTPCT in the last 72 hours. Sepsis Labs: No results for input(s): PROCALCITON, LATICACIDVEN in the last 168 hours.  No results found for this or any previous visit (from the past 240 hour(s)).       Radiology Studies: No results found.      Scheduled Meds: . chlorhexidine  15 mL Mouth Rinse BID  . docusate  100 mg Oral BID  . enoxaparin (LOVENOX) injection  0.5 mg/kg Subcutaneous Q24H  . feeding supplement  237 mL Oral TID BM  . feeding supplement (PROSource TF)  45 mL Per Tube TID  . fentaNYL  1 patch Transdermal Q72H  . folic acid  1 mg Oral Daily  . free water  60 mL Per Tube Q24H  . gabapentin  600 mg Oral Q8H  . melatonin  5 mg Oral QHS  . multivitamin  with minerals  1 tablet Oral Daily  . nystatin   Topical BID  . pantoprazole sodium  40 mg Oral  Daily  . polyethylene glycol  17 g Oral Daily  . polyvinyl alcohol  1 drop Both Eyes BID  . QUEtiapine  100 mg Oral BID  . scopolamine  1 patch Transdermal Q72H  . thiamine  100 mg Oral Daily  . valproic acid  500 mg Oral BID  . vitamin B-12  1,000 mcg Oral Daily   Continuous Infusions: . sodium chloride Stopped (07/27/20 0929)     LOS: 45 days    Time spent: 15  mins.More than 50% of that time was spent in counseling and/or coordination of care.      Burnadette Pop, MD [facility] P12/30/2021, 7:46 AM

## 2020-08-03 NOTE — TOC Progression Note (Signed)
Transition of Care [facility]) - Progression Note    Patient Details  Name: Nicholas Valdez MRN: [id number] Date of Birth: 08/28/1980  [facility] CM/SW Contact  Chapman Fitch, RN Phone Number: 08/03/2020, 12:27 PM  Clinical Narrative:     Discharge to CIR pending bed availability   Expected Discharge Plan: Home/Self Care Barriers to Discharge: Continued Medical Work up,Active Substance Use - Placement  Expected Discharge Plan and Services Expected Discharge Plan: Home/Self Care In-house Referral: Clinical Social Work   Post Acute Care Choice: IP Rehab Living arrangements for the past 2 months: Single Family Home                                       Social Determinants of Health (SDOH) Interventions    Readmission Risk Interventions Readmission Risk Prevention Plan 12/16/2018  Transportation Screening Complete  PCP or Specialist Appt within 5-7 Days Complete  Home Care Screening Complete  Medication Review (RN CM) Complete  Some recent data might be hidden

## 2020-08-03 NOTE — Progress Notes (Signed)
Inpatient Rehabilitation Admissions Coordinator   I do not have a CIR bed at [facility] in [place] for admission for him today. I have alerted acute team and TOC. Luther Parody will follow up tomorrow.  Ottie Glazier, RN, MSN Rehab Admissions Coordinator [phone] 08/03/2020 11:04 AM

## 2020-08-04 ENCOUNTER — Encounter (HOSPITAL_COMMUNITY): Payer: Self-pay

## 2020-08-04 ENCOUNTER — Other Ambulatory Visit: Payer: Self-pay

## 2020-08-04 ENCOUNTER — Inpatient Hospital Stay (HOSPITAL_COMMUNITY)
Admission: AD | Admit: 2020-08-04 | Discharge: 2020-08-11 | DRG: 945 | Disposition: A | Discharge status: Home Health | Payer: Medicaid Other | Source: Intra-hospital | Attending: Physical Medicine & Rehabilitation | Admitting: Physical Medicine & Rehabilitation

## 2020-08-04 DIAGNOSIS — G4730 Sleep apnea, unspecified: Secondary | ICD-10-CM | POA: Diagnosis present

## 2020-08-04 DIAGNOSIS — Z6841 Body Mass Index (BMI) 40.0 and over, adult: Secondary | ICD-10-CM | POA: Diagnosis not present

## 2020-08-04 DIAGNOSIS — D649 Anemia, unspecified: Secondary | ICD-10-CM | POA: Diagnosis not present

## 2020-08-04 DIAGNOSIS — G479 Sleep disorder, unspecified: Secondary | ICD-10-CM

## 2020-08-04 DIAGNOSIS — R609 Edema, unspecified: Secondary | ICD-10-CM

## 2020-08-04 DIAGNOSIS — L89626 Pressure-induced deep tissue damage of left heel: Secondary | ICD-10-CM | POA: Diagnosis present

## 2020-08-04 DIAGNOSIS — G9340 Encephalopathy, unspecified: Secondary | ICD-10-CM

## 2020-08-04 DIAGNOSIS — Z880 Allergy status to penicillin: Secondary | ICD-10-CM

## 2020-08-04 DIAGNOSIS — F419 Anxiety disorder, unspecified: Secondary | ICD-10-CM | POA: Diagnosis present

## 2020-08-04 DIAGNOSIS — E46 Unspecified protein-calorie malnutrition: Secondary | ICD-10-CM

## 2020-08-04 DIAGNOSIS — I10 Essential (primary) hypertension: Secondary | ICD-10-CM | POA: Diagnosis present

## 2020-08-04 DIAGNOSIS — Z930 Tracheostomy status: Secondary | ICD-10-CM

## 2020-08-04 DIAGNOSIS — F10231 Alcohol dependence with withdrawal delirium: Secondary | ICD-10-CM | POA: Diagnosis not present

## 2020-08-04 DIAGNOSIS — R5381 Other malaise: Principal | ICD-10-CM | POA: Diagnosis present

## 2020-08-04 DIAGNOSIS — B1920 Unspecified viral hepatitis C without hepatic coma: Secondary | ICD-10-CM | POA: Diagnosis present

## 2020-08-04 DIAGNOSIS — Z888 Allergy status to other drugs, medicaments and biological substances status: Secondary | ICD-10-CM | POA: Diagnosis not present

## 2020-08-04 DIAGNOSIS — R7303 Prediabetes: Secondary | ICD-10-CM | POA: Diagnosis present

## 2020-08-04 DIAGNOSIS — F32A Depression, unspecified: Secondary | ICD-10-CM | POA: Diagnosis present

## 2020-08-04 DIAGNOSIS — R1310 Dysphagia, unspecified: Secondary | ICD-10-CM

## 2020-08-04 DIAGNOSIS — G4700 Insomnia, unspecified: Secondary | ICD-10-CM | POA: Diagnosis present

## 2020-08-04 DIAGNOSIS — Z931 Gastrostomy status: Secondary | ICD-10-CM

## 2020-08-04 DIAGNOSIS — K5900 Constipation, unspecified: Secondary | ICD-10-CM | POA: Diagnosis present

## 2020-08-04 DIAGNOSIS — R269 Unspecified abnormalities of gait and mobility: Secondary | ICD-10-CM | POA: Diagnosis present

## 2020-08-04 DIAGNOSIS — F1020 Alcohol dependence, uncomplicated: Secondary | ICD-10-CM | POA: Diagnosis present

## 2020-08-04 DIAGNOSIS — F17200 Nicotine dependence, unspecified, uncomplicated: Secondary | ICD-10-CM | POA: Diagnosis present

## 2020-08-04 DIAGNOSIS — J9601 Acute respiratory failure with hypoxia: Secondary | ICD-10-CM | POA: Diagnosis present

## 2020-08-04 DIAGNOSIS — M79672 Pain in left foot: Secondary | ICD-10-CM

## 2020-08-04 DIAGNOSIS — D531 Other megaloblastic anemias, not elsewhere classified: Secondary | ICD-10-CM | POA: Diagnosis present

## 2020-08-04 DIAGNOSIS — G9341 Metabolic encephalopathy: Secondary | ICD-10-CM | POA: Diagnosis present

## 2020-08-04 DIAGNOSIS — E8809 Other disorders of plasma-protein metabolism, not elsewhere classified: Secondary | ICD-10-CM | POA: Diagnosis not present

## 2020-08-04 DIAGNOSIS — R4587 Impulsiveness: Secondary | ICD-10-CM | POA: Diagnosis not present

## 2020-08-04 DIAGNOSIS — K219 Gastro-esophageal reflux disease without esophagitis: Secondary | ICD-10-CM | POA: Diagnosis present

## 2020-08-04 DIAGNOSIS — Z79899 Other long term (current) drug therapy: Secondary | ICD-10-CM | POA: Diagnosis not present

## 2020-08-04 DIAGNOSIS — R600 Localized edema: Secondary | ICD-10-CM

## 2020-08-04 DIAGNOSIS — Z720 Tobacco use: Secondary | ICD-10-CM | POA: Diagnosis not present

## 2020-08-04 DIAGNOSIS — R4189 Other symptoms and signs involving cognitive functions and awareness: Secondary | ICD-10-CM | POA: Diagnosis present

## 2020-08-04 DIAGNOSIS — E7220 Disorder of urea cycle metabolism, unspecified: Secondary | ICD-10-CM

## 2020-08-04 LAB — BASIC METABOLIC PANEL
Anion gap: 11 (ref 5–15)
BUN: 7 mg/dL (ref 6–20)
CO2: 26 mmol/L (ref 22–32)
Calcium: 8.8 mg/dL — ABNORMAL LOW (ref 8.9–10.3)
Chloride: 99 mmol/L (ref 98–111)
Creatinine, Ser: 0.82 mg/dL (ref 0.61–1.24)
GFR, Estimated: >60 mL/min (ref >60)
Glucose, Bld: 139 mg/dL — ABNORMAL HIGH (ref 70–99)
Potassium: 3.8 mmol/L (ref 3.5–5.1)
Sodium: 136 mmol/L (ref 135–145)

## 2020-08-04 LAB — GLUCOSE, CAPILLARY
Glucose-Capillary: 121 mg/dL — ABNORMAL HIGH (ref 70–99)
Glucose-Capillary: 121 mg/dL — ABNORMAL HIGH (ref 70–99)
Glucose-Capillary: 138 mg/dL — ABNORMAL HIGH (ref 70–99)

## 2020-08-04 LAB — PHOSPHORUS: Phosphorus: 5.5 mg/dL — ABNORMAL HIGH (ref 2.5–4.6)

## 2020-08-04 LAB — MAGNESIUM: Magnesium: 2 mg/dL (ref 1.7–2.4)

## 2020-08-04 MED ORDER — QUETIAPINE FUMARATE 100 MG PO TABS: 100.0000 mg | ORAL_TABLET | Freq: Two times a day (BID) | ORAL | Status: DC

## 2020-08-04 MED ORDER — ACETAMINOPHEN 325 MG PO TABS
650.0000 mg | ORAL_TABLET | Freq: Four times a day (QID) | ORAL | Status: DC | PRN
  Administered 2020-08-04 – 2020-08-07 (×3): 650 mg via ORAL
  Filled 2020-08-04 (×3): qty 2

## 2020-08-04 MED ORDER — THIAMINE HCL 100 MG PO TABS: 100.0000 mg | ORAL_TABLET | Freq: Every day | ORAL | Status: DC

## 2020-08-04 MED ORDER — MELATONIN 5 MG PO TABS
5.0000 mg | ORAL_TABLET | Freq: Every day | ORAL | Status: DC
  Administered 2020-08-04 – 2020-08-10 (×7): 5 mg via ORAL
  Filled 2020-08-04 (×7): qty 1

## 2020-08-04 MED ORDER — NYSTATIN 100000 UNIT/GM EX POWD: Freq: Two times a day (BID) | CUTANEOUS | Status: DC

## 2020-08-04 MED ORDER — FENTANYL 100 MCG/HR TD PT72: 1.0000 | MEDICATED_PATCH | TRANSDERMAL | Status: DC

## 2020-08-04 MED ORDER — DOCUSATE SODIUM 50 MG/5ML PO LIQD: 100.0000 mg | Freq: Two times a day (BID) | ORAL | 0 refills | Status: DC

## 2020-08-04 MED ORDER — PROSOURCE TF PO LIQD
45.0000 mL | Freq: Three times a day (TID) | ORAL | Status: DC
  Administered 2020-08-04 – 2020-08-05 (×2): 45 mL
  Filled 2020-08-04 (×2): qty 45

## 2020-08-04 MED ORDER — SCOPOLAMINE 1 MG/3DAYS TD PT72: 1.0000 | MEDICATED_PATCH | TRANSDERMAL | 12 refills | Status: DC

## 2020-08-04 MED ORDER — ENSURE ENLIVE PO LIQD
237.0000 mL | Freq: Three times a day (TID) | ORAL | Status: DC
  Administered 2020-08-04 – 2020-08-10 (×14): 237 mL via ORAL
  Filled 2020-08-04: qty 237

## 2020-08-04 MED ORDER — HALOPERIDOL 1 MG PO TABS
1.0000 mg | ORAL_TABLET | Freq: Four times a day (QID) | ORAL | Status: DC | PRN
  Administered 2020-08-05: 1 mg via ORAL
  Filled 2020-08-04 (×3): qty 1

## 2020-08-04 MED ORDER — OXYCODONE HCL 5 MG PO TABS: 5.0000 mg | ORAL_TABLET | Freq: Four times a day (QID) | ORAL | 0 refills | Status: DC | PRN

## 2020-08-04 MED ORDER — FENTANYL 100 MCG/HR TD PT72
1.0000 | MEDICATED_PATCH | TRANSDERMAL | Status: DC
  Administered 2020-08-04 – 2020-08-10 (×3): 1 via TRANSDERMAL
  Filled 2020-08-04 (×3): qty 1

## 2020-08-04 MED ORDER — PHENOL 1.4 % MT LIQD
1.0000 | OROMUCOSAL | Status: DC | PRN
  Administered 2020-08-05 (×2): 1 via OROMUCOSAL
  Filled 2020-08-04: qty 177

## 2020-08-04 MED ORDER — ADULT MULTIVITAMIN W/MINERALS CH
1.0000 | ORAL_TABLET | Freq: Every day | ORAL | Status: DC
  Administered 2020-08-05 – 2020-08-11 (×7): 1 via ORAL
  Filled 2020-08-04 (×7): qty 1

## 2020-08-04 MED ORDER — PROSOURCE TF PO LIQD: 45.0000 mL | Freq: Three times a day (TID) | ORAL | Status: DC

## 2020-08-04 MED ORDER — VALPROIC ACID 250 MG/5ML PO SOLN: 500.0000 mg | Freq: Two times a day (BID) | ORAL | Status: DC

## 2020-08-04 MED ORDER — HALOPERIDOL 1 MG PO TABS: 1.0000 mg | ORAL_TABLET | Freq: Four times a day (QID) | ORAL | Status: DC | PRN

## 2020-08-04 MED ORDER — CYANOCOBALAMIN 1000 MCG PO TABS: 1000.0000 ug | ORAL_TABLET | Freq: Every day | ORAL | Status: DC

## 2020-08-04 MED ORDER — PANTOPRAZOLE SODIUM 40 MG PO PACK: 40.0000 mg | PACK | Freq: Every day | ORAL | Status: DC

## 2020-08-04 MED ORDER — MAGNESIUM OXIDE 400 (241.3 MG) MG PO TABS
400.0000 mg | ORAL_TABLET | Freq: Two times a day (BID) | ORAL | Status: DC
  Administered 2020-08-04 – 2020-08-11 (×14): 400 mg via ORAL
  Filled 2020-08-04 (×14): qty 1

## 2020-08-04 MED ORDER — OLANZAPINE 5 MG PO TABS
5.0000 mg | ORAL_TABLET | Freq: Four times a day (QID) | ORAL | Status: DC | PRN
  Administered 2020-08-05: 5 mg via ORAL
  Filled 2020-08-04 (×3): qty 1

## 2020-08-04 MED ORDER — ADULT MULTIVITAMIN W/MINERALS CH: 1.0000 | ORAL_TABLET | Freq: Every day | ORAL | Status: DC

## 2020-08-04 MED ORDER — DOCUSATE SODIUM 50 MG/5ML PO LIQD
100.0000 mg | Freq: Two times a day (BID) | ORAL | Status: DC
  Administered 2020-08-04 – 2020-08-05 (×2): 100 mg via ORAL
  Filled 2020-08-04 (×2): qty 10

## 2020-08-04 MED ORDER — OXYCODONE HCL 5 MG PO TABS
5.0000 mg | ORAL_TABLET | Freq: Four times a day (QID) | ORAL | Status: DC | PRN
  Administered 2020-08-04 – 2020-08-07 (×8): 5 mg via ORAL
  Filled 2020-08-04 (×8): qty 1

## 2020-08-04 MED ORDER — ENOXAPARIN SODIUM 80 MG/0.8ML ~~LOC~~ SOLN: 0.5000 mg/kg | SUBCUTANEOUS | Status: DC

## 2020-08-04 MED ORDER — VITAMIN B-12 1000 MCG PO TABS
1000.0000 ug | ORAL_TABLET | Freq: Every day | ORAL | Status: DC
  Administered 2020-08-05: 1000 ug via ORAL
  Filled 2020-08-04: qty 1

## 2020-08-04 MED ORDER — POLYETHYLENE GLYCOL 3350 17 G PO PACK: 17.0000 g | PACK | Freq: Every day | ORAL | 0 refills | Status: DC

## 2020-08-04 MED ORDER — SCOPOLAMINE 1 MG/3DAYS TD PT72
1.0000 | MEDICATED_PATCH | TRANSDERMAL | Status: DC
  Administered 2020-08-07 – 2020-08-10 (×2): 1.5 mg via TRANSDERMAL
  Filled 2020-08-04 (×2): qty 1

## 2020-08-04 MED ORDER — ENSURE ENLIVE PO LIQD: 237.0000 mL | Freq: Three times a day (TID) | ORAL | 12 refills | Status: DC

## 2020-08-04 MED ORDER — OLANZAPINE 5 MG PO TABS: 5.0000 mg | ORAL_TABLET | Freq: Four times a day (QID) | ORAL | Status: DC | PRN

## 2020-08-04 MED ORDER — ALBUTEROL SULFATE (2.5 MG/3ML) 0.083% IN NEBU: 2.5000 mg | INHALATION_SOLUTION | RESPIRATORY_TRACT | Status: DC | PRN

## 2020-08-04 MED ORDER — ENOXAPARIN SODIUM 80 MG/0.8ML ~~LOC~~ SOLN
70.0000 mg | SUBCUTANEOUS | Status: DC
  Administered 2020-08-04 – 2020-08-10 (×7): 70 mg via SUBCUTANEOUS
  Filled 2020-08-04 (×7): qty 0.8

## 2020-08-04 MED ORDER — POLYVINYL ALCOHOL 1.4 % OP SOLN
1.0000 [drp] | Freq: Two times a day (BID) | OPHTHALMIC | Status: DC
  Administered 2020-08-06 – 2020-08-11 (×9): 1 [drp] via OPHTHALMIC
  Filled 2020-08-04: qty 15

## 2020-08-04 MED ORDER — FENTANYL 100 MCG/HR TD PT72: 1.0000 | MEDICATED_PATCH | TRANSDERMAL | 0 refills | Status: DC

## 2020-08-04 MED ORDER — IPRATROPIUM-ALBUTEROL 0.5-2.5 (3) MG/3ML IN SOLN
3.0000 mL | RESPIRATORY_TRACT | Status: DC
  Administered 2020-08-04 – 2020-08-05 (×3): 3 mL via RESPIRATORY_TRACT
  Filled 2020-08-04 (×4): qty 3

## 2020-08-04 MED ORDER — NYSTATIN 100000 UNIT/GM EX POWD: Freq: Two times a day (BID) | CUTANEOUS | 0 refills | Status: DC

## 2020-08-04 MED ORDER — HALOPERIDOL LACTATE 5 MG/ML IJ SOLN
1.0000 mg | Freq: Four times a day (QID) | INTRAMUSCULAR | Status: DC | PRN
  Filled 2020-08-04: qty 0.2

## 2020-08-04 MED ORDER — PANTOPRAZOLE SODIUM 40 MG PO PACK
40.0000 mg | PACK | Freq: Every day | ORAL | Status: DC
  Administered 2020-08-05 – 2020-08-06 (×2): 40 mg via ORAL
  Filled 2020-08-04 (×2): qty 20

## 2020-08-04 MED ORDER — MAGNESIUM OXIDE 400 (241.3 MG) MG PO TABS: 400.0000 mg | ORAL_TABLET | Freq: Two times a day (BID) | ORAL | Status: DC

## 2020-08-04 MED ORDER — FOLIC ACID 1 MG PO TABS
1.0000 mg | ORAL_TABLET | Freq: Every day | ORAL | Status: DC
  Administered 2020-08-05 – 2020-08-11 (×7): 1 mg via ORAL
  Filled 2020-08-04 (×7): qty 1

## 2020-08-04 MED ORDER — HYDROMORPHONE HCL 2 MG PO TABS
4.0000 mg | ORAL_TABLET | Freq: Once | ORAL | Status: DC
  Filled 2020-08-04: qty 2

## 2020-08-04 MED ORDER — ACETAMINOPHEN 650 MG RE SUPP: 650.0000 mg | Freq: Four times a day (QID) | RECTAL | Status: DC | PRN

## 2020-08-04 MED ORDER — VALPROIC ACID 250 MG/5ML PO SOLN
500.0000 mg | Freq: Two times a day (BID) | ORAL | Status: DC
  Administered 2020-08-04 – 2020-08-06 (×4): 500 mg via ORAL
  Filled 2020-08-04 (×4): qty 10

## 2020-08-04 MED ORDER — THIAMINE HCL 100 MG PO TABS
100.0000 mg | ORAL_TABLET | Freq: Every day | ORAL | Status: DC
  Administered 2020-08-05 – 2020-08-11 (×7): 100 mg via ORAL
  Filled 2020-08-04 (×7): qty 1

## 2020-08-04 MED ORDER — GABAPENTIN 300 MG PO CAPS
600.0000 mg | ORAL_CAPSULE | Freq: Three times a day (TID) | ORAL | Status: DC
  Administered 2020-08-04 – 2020-08-11 (×21): 600 mg via ORAL
  Filled 2020-08-04 (×21): qty 2

## 2020-08-04 MED ORDER — LORAZEPAM 0.5 MG PO TABS: 0.5000 mg | ORAL_TABLET | ORAL | 0 refills | Status: DC | PRN

## 2020-08-04 MED ORDER — GABAPENTIN 300 MG PO CAPS: 600.0000 mg | ORAL_CAPSULE | Freq: Three times a day (TID) | ORAL | Status: DC

## 2020-08-04 MED ORDER — FOLIC ACID 1 MG PO TABS: 1.0000 mg | ORAL_TABLET | Freq: Every day | ORAL | Status: DC

## 2020-08-04 MED ORDER — POLYETHYLENE GLYCOL 3350 17 G PO PACK
17.0000 g | PACK | Freq: Every day | ORAL | Status: DC
  Administered 2020-08-05 – 2020-08-07 (×2): 17 g via ORAL
  Filled 2020-08-04 (×4): qty 1

## 2020-08-04 MED ORDER — LORAZEPAM 0.5 MG PO TABS
0.5000 mg | ORAL_TABLET | ORAL | Status: DC | PRN
  Administered 2020-08-04 – 2020-08-11 (×23): 0.5 mg via ORAL
  Filled 2020-08-04 (×25): qty 1

## 2020-08-04 MED ORDER — QUETIAPINE FUMARATE 50 MG PO TABS
100.0000 mg | ORAL_TABLET | Freq: Two times a day (BID) | ORAL | Status: DC
  Administered 2020-08-04 – 2020-08-11 (×14): 100 mg via ORAL
  Filled 2020-08-04 (×9): qty 2
  Filled 2020-08-04: qty 4
  Filled 2020-08-04 (×4): qty 2

## 2020-08-04 NOTE — Discharge Summary (Signed)
Physician Discharge Summary  Nicholas Valdez FAO:[id number] DOB: [birth year] DOA: 06/19/2020  PCP: Oswaldo Conroy, MD  Admit date: 06/19/2020 Discharge date: 08/04/2020  Admitted From: Home Disposition:CiR  Discharge Condition:Stable CODE STATUS:FULL Diet recommendation: Dysphagia 3,tube feeding  Brief/Interim Summary:  This 39 years old male with PMH significant for EtOH and polysubstance abuse admitted with acute metabolic encephalopathy secondary to alcohol withdrawal and acute hypoxic respiratory failure due to aspiration pneumonia and encephalopathy. Patient is now s/p tracheostomy/PEG tube. Patient has prolonged ICU course, hospital course remarkable for withdrawal symptoms with agitation requiring Precedex drip. Patient has much improved, alert and oriented x 2 , participatesin conversation.  Currently hemodynamically stable .transferred to [facility] service on 12/24. Patient is participating with physical therapy. PT recommended CIR. He has a bed in CIR today.  Stable for discharge.  Following problems were addressed during his hospitalization:  Acute hypoxic respiratory failure secondary to acute encephalopathy due to alcohol withdrawal. Patient was intubated on arrival, admitted in the ICU and had a prolonged ICU course. Patient remained intubated, was tried to extubate several times but was not successful. Finally had  tracheostomy tube placed by ENT. Hospital course remarkable for episodes of agitation, requiring Precedex drip. Patient also found to have pneumonia, tracheal aspirate grew ElizabethKingia species.Infectious disease consulted. Patient has completed 7 days of treatment for ElizabethKingia in endotracheal culture with concern for nosocomial pneumonia Speech and swallow evaluation completed, recommended dysphagia 3 diet. Patient has been tolerating diet, more comfortable. Speech recommededpotential downsize of trach from a Shiley #8 to a Shiley #6 for  better airflow around the trach for communication ease. ENT Dr. Jenne Campus notified.  ENT recommended to leave him on #8 on discharge, follow-up as an outpatient for reconsideration of downsizing. Currently hemodynamically stable.  Acute Toxic Metabolic Encephalopathy: Currently alert and oriented Continue as need pain meds Seroquel increased to 100 mg twice daily Psych was following CT head with no acute abnormality. Patienthas intermittent periods of agitation and restlessness requires Haldol and Ativan as needed. Continued one-to-one sitter. He does not have capacity  Hepatitis C:He was taking Sofosbuvir-Velpatasvir at home as per his medication reconciliation.  We can resume it if it is possible at rehab or can resume as an outpatient  Chronic anemia without s/sx of bleeding: Hemoglobin remains stable Consider transfusion if hemoglobin drops below 7.  Hyperglycemia: Blood sugars currently stable  Disposition: PT/OT recommended CIR.  Patient waiting for CiR bed  Morbid obesity: BMI 44.9.   Discharge Diagnoses:  Principal Problem:   Alcohol withdrawal delirium (HCC) Active Problems:   Increased ammonia level   Acute metabolic encephalopathy   Elevated LFTs   Tobacco abuse   Elevated lactic acid level   Acute respiratory failure with hypoxia (HCC)   Pressure injury of skin    Discharge Instructions  Discharge Instructions    Diet general   Complete by: As directed    Dysphagia 3,tube diet   Discharge instructions   Complete by: As directed    1)Please follow up with ENT as an outpatient   Increase activity slowly   Complete by: As directed    No wound care   Complete by: As directed      Allergies as of 08/04/2020      Reactions   Ondansetron Hives   Promethazine Hives   Penicillins Hives   Did it involve swelling of the face/tongue/throat, SOB, or low BP? Yes Did it involve sudden or severe rash/hives, skin peeling, or any reaction on the  inside of your mouth or nose? Yes Did you need to seek medical attention at a hospital or doctor's office? Yes When did it last happen?childhood If all above answers are "NO", may proceed with cephalosporin use. Patient tolerated Ancef administration well   Zofran [ondansetron Hcl] Hives      Medication List    STOP taking these medications   chlordiazePOXIDE 25 MG capsule Commonly known as: LIBRIUM     TAKE these medications   cyanocobalamin 1000 MCG tablet Take 1 tablet (1,000 mcg total) by mouth daily. Start taking on: August 05, 2020   docusate 50 MG/5ML liquid Commonly known as: COLACE Take 10 mLs (100 mg total) by mouth 2 (two) times daily.   feeding supplement Liqd Take 237 mLs by mouth 3 (three) times daily between meals.   feeding supplement (PROSource TF) liquid Place 45 mLs into feeding tube 3 (three) times daily.   fentaNYL 100 MCG/HR Commonly known as: DURAGESIC Place 1 patch onto the skin every 3 (three) days.   folic acid 1 MG tablet Commonly known as: FOLVITE Take by mouth. What changed: Another medication with the same name was added. Make sure you understand how and when to take each.   folic acid 1 MG tablet Commonly known as: FOLVITE Take 1 tablet (1 mg total) by mouth daily. Start taking on: August 05, 2020 What changed: You were already taking a medication with the same name, and this prescription was added. Make sure you understand how and when to take each.   gabapentin 300 MG capsule Commonly known as: NEURONTIN Take 2 capsules (600 mg total) by mouth every 8 (eight) hours. What changed: how much to take   haloperidol 1 MG tablet Commonly known as: HALDOL Take 1 tablet (1 mg total) by mouth every 6 (six) hours as needed for agitation.   LORazepam 0.5 MG tablet Commonly known as: ATIVAN Take 1 tablet (0.5 mg total) by mouth every 4 (four) hours as needed for anxiety.   magnesium oxide 400 (241.3 Mg) MG tablet Commonly known as:  MAG-OX Take 1 tablet (400 mg total) by mouth 2 (two) times daily.   multivitamin with minerals Tabs tablet Take 1 tablet by mouth daily. Start taking on: August 05, 2020   nystatin powder Commonly known as: MYCOSTATIN/NYSTOP Apply topically 2 (two) times daily.   OLANZapine 5 MG tablet Commonly known as: ZYPREXA Take 1 tablet (5 mg total) by mouth 4 (four) times daily as needed (agitation).   oxyCODONE 5 MG immediate release tablet Commonly known as: Oxy IR/ROXICODONE Take 1 tablet (5 mg total) by mouth every 6 (six) hours as needed for severe pain.   pantoprazole sodium 40 mg/20 mL Pack Commonly known as: PROTONIX Take 20 mLs (40 mg total) by mouth daily. Start taking on: August 05, 2020   polyethylene glycol 17 g packet Commonly known as: MIRALAX / GLYCOLAX Take 17 g by mouth daily. Start taking on: August 05, 2020   QUEtiapine 100 MG tablet Commonly known as: SEROQUEL Take 1 tablet (100 mg total) by mouth 2 (two) times daily.   scopolamine 1 MG/3DAYS Commonly known as: TRANSDERM-SCOP Place 1 patch (1.5 mg total) onto the skin every 3 (three) days.   Sofosbuvir-Velpatasvir 400-100 MG Tabs Take 1 tablet by mouth daily.   thiamine 100 MG tablet Take 1 tablet (100 mg total) by mouth daily. Start taking on: August 05, 2020   valproic acid 250 MG/5ML solution Commonly known as: DEPAKENE Take 10 mLs (500 mg  total) by mouth 2 (two) times daily.       Follow-up Information    Linus Salmons, MD. Schedule an appointment as soon as possible for a visit in 1 week(s).   Specialty: Otolaryngology Why: Follow up as an outpatient for [facility] information: [address] [place] [address] [phone]              Allergies  Allergen Reactions  . Ondansetron Hives  . Promethazine Hives  . Penicillins Hives    Did it involve swelling of the face/tongue/throat, SOB, or low BP? Yes Did it involve sudden or severe  rash/hives, skin peeling, or any reaction on the inside of your mouth or nose? Yes Did you need to seek medical attention at a hospital or doctor's office? Yes When did it last happen?childhood If all above answers are "NO", may proceed with cephalosporin use.  Patient tolerated Ancef administration well  . Zofran [Ondansetron Hcl] Hives    Consultations:  ENT   Procedures/Studies: CT ABDOMEN PELVIS WO CONTRAST  Result Date: 07/20/2020 CLINICAL DATA:  Peritonitis.  Planned percutaneous gastrostomy. EXAM: CT ABDOMEN AND PELVIS WITHOUT CONTRAST TECHNIQUE: Multidetector CT imaging of the abdomen and pelvis was performed following the standard protocol without IV contrast. COMPARISON:  07/26/2019 FINDINGS: Lower chest: Patchy bibasilar pulmonary infiltrates are present, more focal within the right middle lobe, possibly related to changes of atypical infection or aspiration. Mild bibasilar atelectasis. Cardiac size within normal limits. Hypoattenuation of the cardiac blood pool is in keeping with at least moderate anemia. Nasogastric tube extends into the mid body of the stomach. Hepatobiliary: The liver is mildly enlarged. No focal liver lesions are identified. No intra or extrahepatic biliary ductal dilation. Gallbladder unremarkable. Pancreas: Unremarkable Spleen: The spleen is mildly enlarged measuring 15.5 cm in greatest dimension. No intrasplenic lesions are identified. Adrenals/Urinary Tract: The adrenal glands are unremarkable. The kidneys are normal. Foley catheter balloon is seen within a decompressed bladder lumen. Stomach/Bowel: The stomach is normal in position and there is appropriate window of access identified for percutaneous gastrostomy targeting the distal body of the stomach. There is no ascites. No free intraperitoneal gas. No intervening peritoneal mass identified. The small and large bowel are unremarkable and there is no evidence of obstruction or focal inflammation. The  appendix is normal. Vascular/Lymphatic: No significant vascular findings are present. No enlarged abdominal or pelvic lymph nodes. Reproductive: Prostate is unremarkable. Other: Tiny fat containing umbilical hernia.  Rectum unremarkable. Musculoskeletal: No acute bone abnormality. No lytic or blastic bone lesion. IMPRESSION: Bibasilar pulmonary infiltrates, likely infectious or inflammatory and suggestive of atypical infection or aspiration. Mild hepatosplenomegaly. Nasogastric tube extends into the mid to distal body of the stomach. In this region, there is an appropriate tract for potential percutaneous access of the stomach. Electronically Signed   By: Helyn Numbers MD   On: 07/20/2020 02:13   DG Abd 1 View  Result Date: 07/10/2020 CLINICAL DATA:  NG tube placement EXAM: ABDOMEN - 1 VIEW COMPARISON:  None. FINDINGS: Enteric feeding tube is in place with the tip in the mid to distal stomach. IMPRESSION: Feeding tube tip in the mid to distal stomach. Electronically Signed   By: Charlett Nose M.D.   On: 07/10/2020 14:08   CT HEAD WO CONTRAST  Result Date: 07/21/2020 CLINICAL DATA:  Altered mental status.  Confusion. EXAM: CT HEAD WITHOUT CONTRAST TECHNIQUE: Contiguous axial images were obtained from the base of the skull through the vertex without intravenous contrast.  COMPARISON:  06/19/2020 FINDINGS: Brain: No sign of old or acute infarction, mass lesion, hemorrhage, hydrocephalus or extra-axial collection. Dilated perivascular space at the base of the brain on the right. Vascular: No abnormal vascular finding. Skull: No skull fracture. Sinuses/Orbits: Mucosal inflammatory changes of the maxillary, ethmoid and sphenoid sinuses. Orbits negative. Other: Bilateral mastoid effusions and fluid in the middle ears. IMPRESSION: 1. No intracranial abnormality. 2. Bilateral mastoid effusions and fluid in the middle ears. 3. Mucosal inflammatory changes of the paranasal sinuses. Electronically Signed   By: Paulina Fusi M.D.   On: 07/21/2020 15:07   CT CHEST WO CONTRAST  Result Date: 07/21/2020 CLINICAL DATA:  Respiratory failure. EXAM: CT CHEST WITHOUT CONTRAST TECHNIQUE: Multidetector CT imaging of the chest was performed following the standard protocol without IV contrast. Exam is significantly limited due to persistent respiratory motion artifact. COMPARISON:  July 26, 2019. FINDINGS: Cardiovascular: No significant vascular findings. Normal heart size. No pericardial effusion. Mediastinum/Nodes: Tracheostomy tube is in good position. Nasogastric tube is seen passing through esophagus into stomach. No significant adenopathy is noted. Thyroid gland is unremarkable. Lungs/Pleura: No pneumothorax or pleural effusion is noted. Multiple airspace opacities are noted in both upper lobes, right greater than left, concerning for multifocal pneumonia. Upper Abdomen: No acute abnormality. Musculoskeletal: No chest wall mass or suspicious bone lesions identified. IMPRESSION: 1. Multiple airspace opacities are noted in both upper lobes, right greater than left, concerning for multifocal pneumonia. 2. Tracheostomy and nasogastric tubes are in good position. Electronically Signed   By: Lupita Raider M.D.   On: 07/21/2020 16:35   DG Chest Port 1 View  Result Date: 07/26/2020 CLINICAL DATA:  39 year old male with respiratory failure. EXAM: PORTABLE CHEST 1 VIEW COMPARISON:  Chest radiograph dated 07/23/2020. FINDINGS: Tracheostomy above the carina. Right perihilar linear atelectasis. Developing infiltrate is not excluded. No pleural effusion pneumothorax. Stable cardiac silhouette. Atherosclerotic calcification of the aorta. IMPRESSION: Right perihilar linear atelectasis. Developing infiltrate is not excluded. Electronically Signed   By: Elgie Collard M.D.   On: 07/26/2020 21:12   DG Chest Port 1 View  Result Date: 07/23/2020 CLINICAL DATA:  Acute respiratory failure EXAM: PORTABLE CHEST 1 VIEW COMPARISON:   July 18, 2020 FINDINGS: The tracheostomy tube is in good position. The NG tube terminates below today's film. No pneumothorax. The cardiomediastinal silhouette is stable. No overt edema. No nodules or masses. No focal infiltrates. IMPRESSION: 1. Support apparatus as above. 2. No acute abnormalities are identified. Electronically Signed   By: Gerome Sam III M.D   On: 07/23/2020 12:54   DG Chest Port 1 View  Result Date: 07/18/2020 CLINICAL DATA:  Altered mental status.  Respiratory distress. EXAM: PORTABLE CHEST 1 VIEW COMPARISON:  Chest x-ray 07/15/2020. FINDINGS: Tracheostomy tube and NG tube in stable position. Heart size stable. Low lung volumes. Persistent atelectasis/infiltrate in the left upper lung and perihilar region, improved however from prior exam. No pleural effusion or pneumothorax. IMPRESSION: 1. Tracheostomy tube and NG tube in stable position. 2. Low lung volumes. Persistent atelectasis/infiltrate in the left upper lung and perihilar region. Interim partial clearing from prior exam. Electronically Signed   By: Maisie Fus  Register   On: 07/18/2020 13:04   DG Chest Port 1 View  Result Date: 07/15/2020 CLINICAL DATA:  Respiratory failure EXAM: PORTABLE CHEST 1 VIEW COMPARISON:  Radiograph 07/10/2019 FINDINGS: Placement tracheostomy tube in good position. Normal cardiac silhouette. There is streaky densities in the LEFT upper lobe new from prior. No focal consolidation. No pneumothorax. IMPRESSION:  1. Tracheostomy tube in good position. 2. Streaky densities in the LEFT upper lobe representing atelectasis versus infiltrate. Electronically Signed   By: Genevive Bi M.D.   On: 07/15/2020 07:05   DG Chest Port 1 View  Result Date: 07/09/2020 CLINICAL DATA:  ETT. EXAM: PORTABLE CHEST 1 VIEW COMPARISON:  July 06, 2020 FINDINGS: The ETT is in good position. The NG tube terminates below today's film. The right central line terminates 3.5 cm into the right atrium, below the caval  atrial junction. The heart, hila, and mediastinum are unchanged. Coarsened lung markings remain. Minimal opacity in left base is improved in the interval, possibly atelectasis. No other acute abnormalities. IMPRESSION: 1. The right IJ terminates 3.5 cm into the right atrium, below the caval atrial junction. The patient may benefit from withdrawing the line 3.5 cm. 2. Other support apparatus as above. 3. Mild opacity in left base is improved in the interval and may represent atelectasis. Recommend attention on follow-up. Electronically Signed   By: Gerome Sam III M.D   On: 07/09/2020 09:51   DG Chest Port 1 View  Result Date: 07/06/2020 CLINICAL DATA:  Intubation. EXAM: PORTABLE CHEST 1 VIEW COMPARISON:  07/03/2020. FINDINGS: Endotracheal tube, NG tube, right PICC line stable position. Heart size stable. Low lung volumes. Left base infiltrate. Small left pleural effusion cannot be excluded. No pneumothorax. IMPRESSION: 1. Lines and tubes in stable position. 2. Low lung volumes. Left base infiltrate. Small left pleural effusion cannot be excluded. Electronically Signed   By: Maisie Fus  Register   On: 07/06/2020 05:13   DG Abd Portable 1V  Result Date: 07/11/2020 CLINICAL DATA:  NG tube placement EXAM: PORTABLE ABDOMEN - 1 VIEW COMPARISON:  July 10, 2020 FINDINGS: The NG tube projects over the gastric body. The visualized bowel gas pattern is nonspecific. IMPRESSION: NG tube projects over the gastric body. Electronically Signed   By: Katherine Mantle M.D.   On: 07/11/2020 22:18   [place] EKG SITE RITE  Result Date: 07/08/2020 If Site Rite image not attached, placement could not be confirmed due to current cardiac rhythm.      Subjective: Patient seen and examined the bedside this morning.  Hemodynamically stable for discharge today.  Discharge Exam: Vitals:   08/04/20 0506 08/04/20 0840  BP: (!) 145/97   Pulse: (!) 111   Resp: 16   Temp: 98.4 F (36.9 C)   SpO2: 96% 97%   Vitals:    08/03/20 2100 08/03/20 2357 08/04/20 0506 08/04/20 0840  BP:  (!) 122/95 (!) 145/97   Pulse:  (!) 115 (!) 111   Resp:  16 16   Temp: 99.1 F (37.3 C) 97.6 F (36.4 C) 98.4 F (36.9 C)   TempSrc: Oral Oral Oral   SpO2:  97% 96% 97%  Weight:      Height:        General: Pt is alert, awake, not in acute distress,trach,obese Cardiovascular: RRR, S1/S2 +, no rubs, no gallops Respiratory: CTA bilaterally, no wheezing, no rhonchi Abdominal: Soft, NT, ND, bowel sounds +,PEG Extremities: no edema, no cyanosis    The results of significant diagnostics from this hospitalization (including imaging, microbiology, ancillary and laboratory) are listed below for reference.     Microbiology: No results found for this or any previous visit (from the past 240 hour(s)).   Labs: BNP (last 3 results) Recent Labs    09/13/19 1559 06/25/20 0047  BNP 25.0 494.9*   Basic Metabolic Panel: Recent Labs  Lab 07/31/20 0535  08/01/20 0342 08/02/20 0348 08/03/20 0511 08/04/20 0345  NA 134* 136 136 138 136  K 3.2* 3.8 4.1 4.0 3.8  CL 98 101 100 101 99  CO2 GLUCOSE 121* 112* 104* 119* 139*  BUN CREATININE 0.67 0.66 0.73 0.76 0.82  CALCIUM 8.9 9.0 9.1 8.9 8.8*  MG 2.0 2.0 2.1 1.8 2.0  PHOS 5.0* 5.6* 5.0* 5.0* 5.5*   Liver Function Tests: Recent Labs  Lab 07/31/20 0535  AST 23  ALT 24  ALKPHOS 47  BILITOT 0.3  PROT 6.7  ALBUMIN 3.2*   No results for input(s): LIPASE, AMYLASE in the last 168 hours. No results for input(s): AMMONIA in the last 168 hours. CBC: Recent Labs  Lab 07/29/20 0444 07/30/20 0557 07/31/20 0535 08/01/20 0342 08/02/20 0348  WBC 8.5 8.5 7.5 8.1 8.8  HGB 10.5* 9.4* 9.4* 9.3* 9.9*  HCT 30.6* 28.3* 27.4* 27.4* 29.3*  MCV 98.1 98.3 98.6 99.3 99.3  PLT 311 313 311 340 372   Cardiac Enzymes: No results for input(s): CKTOTAL, CKMB, CKMBINDEX, TROPONINI in the last 168 hours. BNP: Invalid input(s): POCBNP CBG: Recent Labs  Lab  08/01/20 2111 08/02/20 0724 08/02/20 1110 08/02/20 1604 08/04/20 0503  GLUCAP 130* 118* 106* 115* 138*   D-Dimer No results for input(s): DDIMER in the last 72 hours. Hgb A1c No results for input(s): HGBA1C in the last 72 hours. Lipid Profile No results for input(s): CHOL, HDL, LDLCALC, TRIG, CHOLHDL, LDLDIRECT in the last 72 hours. Thyroid function studies No results for input(s): TSH, T4TOTAL, T3FREE, THYROIDAB in the last 72 hours.  Invalid input(s): FREET3 Anemia work up No results for input(s): VITAMINB12, FOLATE, FERRITIN, TIBC, IRON, RETICCTPCT in the last 72 hours. Urinalysis    Component Value Date/Time   COLORURINE YELLOW (A) 06/20/2020 0348   APPEARANCEUR CLEAR (A) 06/20/2020 0348   APPEARANCEUR Hazy 02/12/2013 2028   LABSPEC 1.015 06/20/2020 0348   LABSPEC 1.019 02/12/2013 2028   PHURINE 6.0 06/20/2020 0348   GLUCOSEU NEGATIVE 06/20/2020 0348   GLUCOSEU Negative 02/12/2013 2028   HGBUR NEGATIVE 06/20/2020 0348   BILIRUBINUR NEGATIVE 06/20/2020 0348   BILIRUBINUR Negative 02/12/2013 2028   KETONESUR NEGATIVE 06/20/2020 0348   PROTEINUR NEGATIVE 06/20/2020 0348   NITRITE NEGATIVE 06/20/2020 0348   LEUKOCYTESUR NEGATIVE 06/20/2020 0348   LEUKOCYTESUR Negative 02/12/2013 2028   Sepsis Labs Invalid input(s): PROCALCITONIN,  WBC,  LACTICIDVEN Microbiology No results found for this or any previous visit (from the past 240 hour(s)).  Please note: You were cared for by a hospitalist during your hospital stay. Once you are discharged, your primary care physician will handle any further medical issues. Please note that NO REFILLS for any discharge medications will be authorized once you are discharged, as it is imperative that you return to your primary care physician (or establish a relationship with a primary care physician if you do not have one) for your post hospital discharge needs so that they can reassess your need for medications and monitor your lab  values.    Time coordinating discharge: 40 minutes  SIGNED:   Burnadette Pop, MD  [facility] 08/04/2020, 11:37 AM Pager [phone]  If 7PM-7AM, please contact night-coverage [email or web address] Password [email or web address]

## 2020-08-04 NOTE — Progress Notes (Addendum)
Pt shows impulsiveness. Not pulling bathroom call bell when finished. Pt keeps readjusting at edge of bed and upset with bed alarm. Tele sitter in place and bed alarm turned off. Tele sitter AWARE. Pt continues to self transfer to bathroom before staff could get to room. Mylo Red, LPN

## 2020-08-04 NOTE — IPOC Note (Signed)
Individualized overall Plan of Care [facility]) Patient Details Name: Nicholas Valdez MRN: [id number] DOB: 03/01/1981  Admitting Diagnosis: Acute metabolic encephalopathy  Hospital Problems: Principal Problem:   Acute metabolic encephalopathy Active Problems:   Peripheral edema   Impulsive     Functional Problem List: Nursing Endurance,Safety,Behavior,Edema,Medication Management  PT Balance,Perception,Behavior,Safety,Edema,Sensory,Endurance,Skin Integrity,Nutrition,Pain,Motor  OT Balance,Safety,Behavior,Cognition,Edema,Skin Integrity,Endurance,Motor,Nutrition  SLP Behavior,Cognition,Safety  TR         Basic ADL's: OT Grooming,Bathing,Dressing,Toileting     Advanced  ADL's: OT Simple Meal Preparation     Transfers: PT Bed Mobility,Bed to Chair,Car,Furniture,Floor  OT Toilet,Tub/Shower     Locomotion: PT Ambulation,Stairs     Additional Impairments: OT None  SLP Swallowing      TR      Anticipated Outcomes Item Anticipated Outcome  Self Feeding No goal  Swallowing  Supervision with regular/thin diet   Basic self-care  Supervision  Toileting  Supervision   Bathroom Transfers Supervision  Bowel/Bladder  n/a  Transfers  supervision  Locomotion  supervision  Communication     Cognition  mod A for cognition  Pain  PAin at or below level 4  Safety/Judgment  manage safety with cues/reminders   Therapy Plan: PT Intensity: Minimum of 1-2 x/day ,45 to 90 minutes PT Frequency: 5 out of 7 days PT Duration Estimated Length of Stay: 10-12 days OT Intensity: Minimum of 1-2 x/day, 45 to 90 minutes OT Frequency: 5 out of 7 days OT Duration/Estimated Length of Stay: 3-5 days SLP Intensity: Minumum of 1-2 x/day, 30 to 90 minutes SLP Frequency: 3 to 5 out of 7 days SLP Duration/Estimated Length of Stay: 2-3 weeks    Team Interventions: Nursing Interventions Patient/Family Education,Disease Management/Prevention,Medication Management,Pain Management  PT  interventions Ambulation/gait training,Community reintegration,DME/adaptive equipment instruction,Neuromuscular re-education,Psychosocial support,Stair training,UE/LE Strength taining/ROM,Balance/vestibular training,Discharge planning,Functional electrical stimulation,Pain management,Skin care/wound management,Therapeutic Activities,UE/LE Coordination activities,Cognitive remediation/compensation,Disease management/prevention,Functional mobility training,Patient/family education,Splinting/orthotics,Therapeutic Exercise,Visual/perceptual remediation/compensation  OT Interventions Discharge planning,Balance/vestibular training,Pain management,Self Care/advanced ADL retraining,Therapeutic Activities,UE/LE Coordination activities,Therapeutic Exercise,Patient/family education,Functional mobility training,Disease mangement/prevention,Cognitive remediation/compensation,Community reintegration,DME/adaptive equipment instruction,Psychosocial support,UE/LE Strength taining/ROM  SLP Interventions Cognitive remediation/compensation,Cueing hierarchy,Dysphagia/aspiration precaution training,Medication managment,Patient/family education,Therapeutic Exercise,Therapeutic Activities,Functional tasks  TR Interventions    SW/CM Interventions Discharge Planning,Psychosocial Support,Patient/Family Education   Barriers to Discharge MD  Medical stability, Weight, and Behavior  Nursing Decreased caregiver support    PT Trach,Weight,Behavior,Nutrition means    OT Trach,Weight,Behavior Impulsivity + decreased safety awareness  SLP Trach cognitive impairment, trach  SW       Team Discharge Planning: Destination: PT-Home ,OT- Home , SLP-Home Projected Follow-up: PT-Outpatient PT,24 hour supervision/assistance, OT-  24 hour supervision/assistance,Home health OT, SLP-Home Health SLP Projected Equipment Needs: PT-To be determined, OT- To be determined, SLP-To be determined Equipment Details: PT- , OT-  Patient/family  involved in discharge planning: PT- Patient,Family member/caregiver,  OT-Patient, SLP-Patient  MD ELOS: 4-7 days. Medical Rehab Prognosis:  Good Assessment: 39 year old right-handed male with history significant for obesity with BMI 44.98, hepatitis C as well as alcohol/polysubstance /tobacco abuse.  He presented on 06/19/2020 to [facility] for inpatient detox but was found to be confused with nausea and was sent to the emergency department.  Patient was seen in the emergency department approximately 2 weeks prior for alcohol withdrawal and started on Librium protocol.  Cranial CT unremarkable for acute intracranial process.  Admission chemistries glucose 137 calcium 7.8, hemoglobin 13.3, alcohol 15, ammonia 76, lactic acid 3.3, CK 138.  He was emergently intubated for airway protection.  Patient with prolonged ICU course continued to have alcohol withdrawal agitation, requiring Precedex drip.  Required tracheostomy on 07/10/2020 per Dr. Bud Face with #8 cuffed Shiley placed as well as placement of gastrostomy tube for nutritional support on 07/22/2020 per Dr. Sterling Big.  Tracheal aspirate Elizabethkingia Meningoseptica and placed on broad-spectrum antibiotics which were completed on 08/01/2020. There is no current plan to downsize this patient remained with #8 Shiley trach as well as speech therapy working with Passy-Muir valve.  Patient has required sitters for agitation and restlessness and remains on Seroquel as well as Zyprexa/valproic acid.  He is tolerating a mechanical soft thin liquid diet.  Hospital course complicated by acute on chronic anemia with latest hemoglobin of 9.9 and chemistries unremarkable as of 08/02/2020.  Due to decreased functional mobility and necessity for assistance with ADLs, patient was admitted due to patient decreasing functional mobility need for assistance for ADLs he was admitted for a comprehensive rehabilitation program.  Will set goals for Supervision with PT/OT and Min  ASLP.  Due to the current state of emergency, patients may not be receiving their 3-hours of Medicare-mandated therapy.  See Team Conference Notes for weekly updates to the plan of care

## 2020-08-04 NOTE — Progress Notes (Addendum)
Order for extra trach was placed by secretary. Pt refuses to wear ATC at this time.

## 2020-08-04 NOTE — Progress Notes (Incomplete)
PROGRESS NOTE    [name]  TIW:[id number] DOB: 07/01/1981 DOA: 06/19/2020 PCP: Oswaldo Conroy, MD   Chief Complain:Confusion  Brief Narrative: This 39 years old male with PMH significant for EtOH and polysubstance abuse admitted with acute metabolic encephalopathy secondary to alcohol withdrawal and acute hypoxic respiratory failure due to aspiration pneumonia and encephalopathy.  Patient is now s/p tracheostomy/PEG tube. Patient has prolonged ICU course, hospital course remarkable for withdrawal symptoms with agitation requiring Precedex drip. Patient has much improved, alert and oriented x 2 , participates in conversation.  Currently hemodynamically stable .transferred to [facility] service on 12/24. Patient is participating with physical therapy.  PT recommended CIR. currently waiting for CIR bed.  Assessment & Plan:   Principal Problem:   Alcohol withdrawal delirium (HCC) Active Problems:   Increased ammonia level   Acute metabolic encephalopathy   Elevated LFTs   Tobacco abuse   Elevated lactic acid level   Acute respiratory failure with hypoxia (HCC)   Pressure injury of skin   Acute hypoxic respiratory failure secondary to acute encephalopathy due to alcohol withdrawal. Patient was intubated on arrival,  admitted in the ICU and had a prolonged ICU course. Patient remained intubated, was tried to extubate several times but was not successful. Finally had  tracheostomy tube placed by ENT. Hospital course remarkable for episodes of agitation,  requiring Precedex drip. Patient also found to have pneumonia, tracheal aspirate grew ElizabethKingia species.Infectious disease consulted. Patient has completed 7 days of treatment for ElizabethKingia in endotracheal culture with concern for nosocomial pneumonia Speech and swallow evaluation completed, recommended dysphagia 3 diet. Patient has been tolerating diet,  more comfortable. Speech recommeded potential downsize of trach  from a Shiley #8 to a Shiley #6 for better airflow around the trach for communication ease.  ENT Dr. Jenne Campus notified.  ENT recommended to leave him on #8 on discharge, follow-up as an outpatient for reconsideration of downsizing. Currently hemodynamically stable.  Acute Toxic Metabolic Encephalopathy: Continue supportive care. Continue scheduled oxycodone and Valium, fentanyl patch,  Seroquel increased to 100 mg twice daily Psych following, appreciate input CT head with no acute abnormality. Patient has intermittent periods of agitation and restlessness requires Haldol and Ativan as needed. Continued one-to-one sitter.  Hepatitis C: We recommend treatment as  outpatient  Chronic anemia without s/sx of bleeding: Hemoglobin remains stable  Consider transfusion if hemoglobin drops below 7.  Hyperglycemia: Regular insulin sliding scale.  Monitor blood sugars  Disposition: PT/OT recommended CIR.  Patient waiting for CiR bed  Morbid obesity: BMI 44.9.  Nutrition Problem: Inadequate oral intake Etiology: inability to eat      DVT prophylaxis: Lovenox Code Status: Full Family Communication: None at bedside Status is: Inpatient  Remains inpatient appropriate because:Unsafe d/c plan   Dispo: The patient is from: Home              Anticipated d/c is to: CIR              Anticipated d/c date is: As soon as bed is available              Patient currently is medically stable to d/c.    Consultants: PCCM,ID, ENT, psychiatry  Procedures: Tracheostomy, PEG  Antimicrobials:  Anti-infectives (From admission, onward)   Start     Dose/Rate Route Frequency Ordered Stop   07/21/20 1700  ciprofloxacin (CIPRO) IVPB 400 mg  Status:  Discontinued        400 mg 200 mL/hr over 60 Minutes Intravenous  Every 12 hours 07/21/20 1543 07/27/20 1056   07/13/20 0800  minocycline (MINOCIN) capsule 100 mg       "Followed by" Linked Group Details   100 mg Per Tube 2 times daily 07/12/20 1305  07/18/20 1958   07/12/20 1400  minocycline (MINOCIN) capsule 200 mg       "Followed by" Linked Group Details   200 mg Per Tube  Once 07/12/20 1305 07/12/20 1447   07/06/20 2200  fluconazole (DIFLUCAN) tablet 100 mg  Status:  Discontinued        100 mg Per Tube Daily at bedtime 07/05/20 1525 07/12/20 0908   07/06/20 0400  anidulafungin (ERAXIS) 50 mg in sodium chloride 0.9 % 50 mL IVPB  Status:  Discontinued       "Followed by" Linked Group Details   50 mg 78 mL/hr over 50 Minutes Intravenous Every 24 hours 07/05/20 0313 07/05/20 1525   07/05/20 2200  fluconazole (DIFLUCAN) tablet 200 mg        200 mg Per Tube  Once 07/05/20 1525 07/05/20 2211   07/05/20 0400  anidulafungin (ERAXIS) 100 mg in sodium chloride 0.9 % 100 mL IVPB       "Followed by" Linked Group Details   100 mg 78 mL/hr over 100 Minutes Intravenous  Once 07/05/20 0313 07/05/20 0554   07/03/20 0500  Ampicillin-Sulbactam (UNASYN) 3 g in sodium chloride 0.9 % 100 mL IVPB  Status:  Discontinued        3 g 200 mL/hr over 30 Minutes Intravenous Every 6 hours 07/03/20 0349 07/07/20 1331   06/26/20 0000  vancomycin (VANCOREADY) IVPB 1750 mg/350 mL  Status:  Discontinued        1,750 mg 175 mL/hr over 120 Minutes Intravenous Every 24 hours 06/24/20 2330 06/27/20 1123   06/25/20 0015  vancomycin (VANCOREADY) IVPB 2000 mg/400 mL       "Followed by" Linked Group Details   2,000 mg 200 mL/hr over 120 Minutes Intravenous  Once 06/24/20 2325 06/25/20 0204   06/25/20 0015  vancomycin (VANCOREADY) IVPB 500 mg/100 mL       "Followed by" Linked Group Details   500 mg 100 mL/hr over 60 Minutes Intravenous  Once 06/24/20 2325 06/25/20 0105   06/21/20 1000  Ampicillin-Sulbactam (UNASYN) 3 g in sodium chloride 0.9 % 100 mL IVPB  Status:  Discontinued        3 g 200 mL/hr over 30 Minutes Intravenous Every 6 hours 06/21/20 0925 06/27/20 1123      Subjective:  Patient seen and examined at the bedside this morning.  Hemodynamically stable.   Alert, awake and comfortable today.  Sitting on the bed.  Denies any complaints  Objective: Vitals:   08/03/20 1956 08/03/20 2100 08/03/20 2357 08/04/20 0506  BP: 131/85  (!) 122/95 (!) 145/97  Pulse: (!) 120  (!) 115 (!) 111  Resp: 18  16 16   Temp: 98.5 F (36.9 C) 99.1 F (37.3 C) 97.6 F (36.4 C) 98.4 F (36.9 C)  TempSrc: Oral Oral Oral Oral  SpO2: 97%  97% 96%  Weight:      Height:        Intake/Output Summary (Last 24 hours) at 08/04/2020 0817 Last data filed at 08/03/2020 2100 Gross per 24 hour  Intake 960 ml  Output -  Net 960 ml   Filed Weights   07/29/20 0600 07/31/20 0500 08/01/20 0500  Weight: (!) 139.8 kg (!) 139.6 kg (!) 142.2 kg    Examination:  General exam: Comfortable, morbidly obese  HEENT:Trach Respiratory system: Bilateral equal air entry, normal vesicular breath sounds, no wheezes or crackles  Cardiovascular system: S1 & S2 heard, RRR. No JVD, murmurs, rubs, gallops or clicks. Gastrointestinal system: Abdomen is nondistended, soft and nontender. No organomegaly or masses felt. Normal bowel sounds heard. Central nervous system: Alert and oriented. No focal neurological deficits. Extremities: No edema, no clubbing ,no cyanosis Go Separate message sent with thiamine follow-up Nimish thousand skin: No rashes, lesions or ulcers,no icterus ,no pallor   Data Reviewed: I have personally reviewed following labs and imaging studies  CBC: Recent Labs  Lab 07/29/20 0444 07/30/20 0557 07/31/20 0535 08/01/20 0342 08/02/20 0348  WBC 8.5 8.5 7.5 8.1 8.8  HGB 10.5* 9.4* 9.4* 9.3* 9.9*  HCT 30.6* 28.3* 27.4* 27.4* 29.3*  MCV 98.1 98.3 98.6 99.3 99.3  PLT 311 313 311 340 372   Basic Metabolic Panel: Recent Labs  Lab 07/31/20 0535 08/01/20 0342 08/02/20 0348 08/03/20 0511 08/04/20 0345  NA 134* 136 136 138 136  K 3.2* 3.8 4.1 4.0 3.8  CL 98 101 100 101 99  CO2 28 25 26 27 26   GLUCOSE 121* 112* 104* 119* 139*  BUN 10 9 10 10 7   CREATININE  0.67 0.66 0.73 0.76 0.82  CALCIUM 8.9 9.0 9.1 8.9 8.8*  MG 2.0 2.0 2.1 1.8 2.0  PHOS 5.0* 5.6* 5.0* 5.0* 5.5*   GFR: Estimated Creatinine Clearance: 175.7 mL/min (by C-G formula based on SCr of 0.82 mg/dL). Liver Function Tests: Recent Labs  Lab 07/31/20 0535  AST 23  ALT 24  ALKPHOS 47  BILITOT 0.3  PROT 6.7  ALBUMIN 3.2*   No results for input(s): LIPASE, AMYLASE in the last 168 hours. No results for input(s): AMMONIA in the last 168 hours. Coagulation Profile: No results for input(s): INR, PROTIME in the last 168 hours. Cardiac Enzymes: No results for input(s): CKTOTAL, CKMB, CKMBINDEX, TROPONINI in the last 168 hours. BNP (last 3 results) No results for input(s): PROBNP in the last 8760 hours. HbA1C: No results for input(s): HGBA1C in the last 72 hours. CBG: Recent Labs  Lab 08/01/20 2111 08/02/20 0724 08/02/20 1110 08/02/20 1604 08/04/20 0503  GLUCAP 130* 118* 106* 115* 138*   Lipid Profile: No results for input(s): CHOL, HDL, LDLCALC, TRIG, CHOLHDL, LDLDIRECT in the last 72 hours. Thyroid Function Tests: No results for input(s): TSH, T4TOTAL, FREET4, T3FREE, THYROIDAB in the last 72 hours. Anemia Panel: No results for input(s): VITAMINB12, FOLATE, FERRITIN, TIBC, IRON, RETICCTPCT in the last 72 hours. Sepsis Labs: No results for input(s): PROCALCITON, LATICACIDVEN in the last 168 hours.  No results found for this or any previous visit (from the past 240 hour(s)).       Radiology Studies: No results found.      Scheduled Meds: . chlorhexidine  15 mL Mouth Rinse BID  . docusate  100 mg Oral BID  . enoxaparin (LOVENOX) injection  0.5 mg/kg Subcutaneous Q24H  . feeding supplement  237 mL Oral TID BM  . feeding supplement (PROSource TF)  45 mL Per Tube TID  . fentaNYL  1 patch Transdermal Q72H  . folic acid  1 mg Oral Daily  . gabapentin  600 mg Oral Q8H  . HYDROmorphone  4 mg Oral Once  . magnesium oxide  400 mg Oral BID  . melatonin  5 mg Oral  QHS  . multivitamin with minerals  1 tablet Oral Daily  . nystatin   Topical BID  .  pantoprazole sodium  40 mg Oral Daily  . polyethylene glycol  17 g Oral Daily  . polyvinyl alcohol  1 drop Both Eyes BID  . QUEtiapine  100 mg Oral BID  . scopolamine  1 patch Transdermal Q72H  . thiamine  100 mg Oral Daily  . valproic acid  500 mg Oral BID  . vitamin B-12  1,000 mcg Oral Daily   Continuous Infusions: . sodium chloride Stopped (07/27/20 0929)     LOS: 46 days    Time spent: 15  mins.More than 50% of that time was spent in counseling and/or coordination of care.      Burnadette Pop, MD [facility] P12/31/2021, 8:17 AM

## 2020-08-04 NOTE — Progress Notes (Signed)
Inpatient Rehabilitation Medication Review by a Pharmacist  A complete drug regimen review was completed for this patient to identify any potential clinically significant medication issues.  Clinically significant medication issues were identified:  no  Check AMION for pharmacist assigned to patient if future medication questions/issues arise during this admission.  Pharmacist comments: All inpatient meds resumed appropriately on transfer to rehab.  PTA med Sofosbuvir/Velpatasvir Nicholas Valdez) for Hep C not available for inpatient use.  Patient will need to follow-up with ID on discharge and resume / restart therapy for Hep C.  Time spent performing this drug regimen review (minutes):  20   [employer], Pharm.D., BCPS Clinical Pharmacist 08/04/2020 3:12 PM

## 2020-08-04 NOTE — Progress Notes (Signed)
Physical Therapy Treatment Patient Details Name: Nicholas Valdez MRN: [id number] DOB: [birth year] Today's Date: 08/04/2020    History of Present Illness Nicholas Valdez "[name]" is a 39 year old male with past medical history significant for EtOH and polysubstance abuse admitted with acute metabolic encephalopathy secondary to alcohol withdrawal and acute hypoxic respiratory failure due to encephalopathy and aspiration pneumonia.  Status post tracheostomy.    PT Comments    Pt was sitting up in bed with sitter present. He agrees to PT session asking when his PEG/Trach will be removed. Discussed with pt that MDs are the ones who will make that decision. He was cooperative however continues to be impulsive. HR quickly elevated during standing/ambulation with SOB noted. He did ambulate without AD and was able to ascend/descend 4 stair with rail. Will benefit from CIR to maximize safety with ADL    Follow Up Recommendations  CIR     Equipment Recommendations  Other (comment) (defer to next level of care)    Recommendations for Other Services       Precautions / Restrictions Precautions Precautions: Fall Precaution Comments: trach collar/Passy muir (PEG) Restrictions Weight Bearing Restrictions: No    Mobility  Bed Mobility Overal bed mobility: Modified Independent     Transfers Overall transfer level: Needs assistance Equipment used: None Transfers: Sit to/from Stand Sit to Stand: Min guard         General transfer comment: CGA due to pt's poor safety awarenss/impulsivity  Ambulation/Gait Ambulation/Gait assistance: Min guard Gait Distance (Feet): 200 Feet (seated rest after 200 ft) Assistive device: None (gait belt) Gait Pattern/deviations: Staggering left;Staggering right Gait velocity: WNL   General Gait Details: HR qyuickly elevated to 130s with ambulation   Stairs Stairs: Yes Stairs assistance: Min guard Stair Management: One rail Right Number of  Stairs: 4        Balance Overall balance assessment: Needs assistance Sitting-balance support: Feet supported Sitting balance-Leahy Scale: Good Sitting balance - Comments: no LOB sitting EOB   Standing balance support: During functional activity;No upper extremity supported;Bilateral upper extremity supported Standing balance-Leahy Scale: Fair        Cognition Arousal/Alertness: Awake/alert Behavior During Therapy: Impulsive;WFL for tasks assessed/performed Overall Cognitive Status: No family/caregiver present to determine baseline cognitive functioning      General Comments: Pt was A and cooperative. continues to be impulsive at times throughout session.             Pertinent Vitals/Pain Pain Assessment: No/denies pain Pain Score: 0-No pain Faces Pain Scale: No hurt           PT Goals (current goals can now be found in the care plan section) Acute Rehab PT Goals Patient Stated Goal: go home eventually Progress towards PT goals: Progressing toward goals    Frequency    Min 2X/week      PT Plan Current plan remains appropriate       AM-PAC PT "6 Clicks" Mobility   Outcome Measure  Help needed turning from your back to your side while in a flat bed without using bedrails?: None Help needed moving from lying on your back to sitting on the side of a flat bed without using bedrails?: None Help needed moving to and from a bed to a chair (including a wheelchair)?: A Little Help needed standing up from a chair using your arms (e.g., wheelchair or bedside chair)?: A Little Help needed to walk in hospital room?: A Little Help needed climbing 3-5 steps with a railing? : A  Little 6 Click Score: 20    End of Session Equipment Utilized During Treatment: Gait belt Activity Tolerance: Patient tolerated treatment well Patient left: in bed;with call bell/phone within reach;with nursing/sitter in room Nurse Communication: Mobility status PT Visit Diagnosis: Muscle  weakness (generalized) (M62.81);Difficulty in walking, not elsewhere classified (R26.2);Unsteadiness on feet (R26.81)     Time: 8563-1497 PT Time Calculation (min) (ACUTE ONLY): 25 min  Charges:  $Gait Training: 23-37 mins                     Jetta Lout PTA 08/04/20, 12:46 PM

## 2020-08-04 NOTE — Progress Notes (Signed)
Inpatient Rehab Admissions Coordinator:   I have a bed available on CIR for patient to admit today.  Dr. Renford Dills in agreement.  Will let pt/family and TOC team know.    Estill Dooms, PT, DPT Admissions Coordinator [phone] 08/04/20  9:55 AM

## 2020-08-04 NOTE — Progress Notes (Signed)
Jamse Arn, MD  Physician  Physical Medicine and Rehabilitation  PMR Pre-admission    Signed  Date of Service:  08/04/2020 9:56 AM      Related encounter: ED to Hosp-Admission (Discharged) from 06/19/2020 in [place]       Signed         Show:Clear all _0 Manual_1 Template_2 Copied  Added by: _3 Cristina Gong, RN_4 Patel, Ankit [place], MD_5 Michel Santee, PT   _6 Hover for details  PMR Admission Coordinator Pre-Admission Assessment  Patient: Nicholas Valdez is an 39 y.o., male MRN: [id number] DOB: [birth year] Height: _7  (177.8 cm) Weight: (!) 142.2 kg  Insurance Information HMO:     PPO:      PCP:      IPA:      80/20:      OTHER:  PRIMARY: Medicaid of [place]     Policy#: [id number]      Subscriber: pt CM Name: n/a      Phone#:      Fax#:  Pre-Cert#: verified online and via phone, MAF-CN coverage code      Employer:  Benefits:  Phone #:      Name:  Eff. Date:      Deduct:       Out of Pocket Max:       Life Max:  CIR: 100%     SNF: no benefits Outpatient:      Co-Pay:  Home Health:       Co-Pay:  DME:      Co-Pay:  Providers:  SECONDARY:       Policy#:      Phone#:   Development worker, community:       Phone#:   The "Data Collection Information Summary" for patients in Inpatient Rehabilitation Facilities with attached "Privacy Act [place] Records" was provided and verbally reviewed with: N/A  Emergency Contact Information         Contact Information    Name Relation Home Work Mobile   Shai, Mckenzie Mother [phone]     Claris, Guymon Father   [phone]   Mason,Catherine Friend [phone]  [phone]   Seymour Bars Friend [phone]        Current Medical History  Patient Admitting Diagnosis: debility  History of Present Illness: Nicholas Valdez is a 39 year old right-handed male with history significant for obesity with BMI 44.98, hepatitis C as  well as alcohol/polysubstance/tobacco abuse. Per chart review patient lives with parent and 24 year old daughter. Independent prior to admission. 1 level home 2 steps to entry. Independent at baseline. Presented 06/19/2020 to [facility] for inpatient detox but was found to be confused with nausea and was sent to the emergency department. Patient was seen in the emergency department approxi-2 weeks prior for alcohol withdrawal started on Librium protocol. Cranial CT scan negative. Admission chemistries glucose 137 calcium 7.8, hemoglobin 13.3, alcohol 15, ammonia level 76, lactic acid 3.3, CK 138. He was emergently intubated for airway protection. Patient with prolonged ICU course continued to have alcohol withdrawal agitation required Precedex drip. Required tracheostomy tube 07/10/2020 per Dr. Carloyn Manner with #8 cuffed Shiley placed as well as placement of gastrostomy tube for nutritional support 07/22/2020 per Dr. Caroleen Hamman.Tracheal aspirate Elizabethkingia Meningosepticaand placed on broad-spectrum antibiotics that have since been completed. As of 08/01/2020 there was no current plan to downsize this patient remained with #8 Shiley trach as well as speech therapy working with Passy-Muir valve. Patient has required sitters for agitation restlessness and remains on  Seroquel as well as Zyprexa/valproic acid. He is tolerating a mechanical soft thin liquid diet. Maintained on Lovenox for DVT prophylaxis hospital course acute on chronic anemia latest hemoglobin 9.9 and chemistries unremarkable as of 08/02/2020. Patient's medical record from [facility] has been reviewed by the rehabilitation admission coordinator and physician.  Past Medical History      Past Medical History:  Diagnosis Date  . Alcohol abuse   . Anxiety   . Depression   . Drug abuse ([place])   . GERD (gastroesophageal reflux disease)   . Hepatitis C   . Hypertension   . Pneumonia   .  Pre-diabetes   . Sleep apnea     Family History   family history includes Hypertension in an other family member.  Prior Rehab/Hospitalizations Has the patient had prior rehab or hospitalizations prior to admission? No  Has the patient had major surgery during 100 days prior to admission? Yes             Current Medications  Current Facility-Administered Medications:  .  0.9 %  sodium chloride infusion, 250 mL, Intravenous, Continuous, Pabon, Marjory Lies, MD, Stopped at 07/27/20 [phone] .  acetaminophen (TYLENOL) tablet 650 mg, 650 mg, Oral, Q6H PRN **OR** acetaminophen (TYLENOL) suppository 650 mg, 650 mg, Rectal, Q6H PRN, Chappell, Alex B, RPH .  albuterol (PROVENTIL) (2.5 MG/3ML) 0.083% nebulizer solution 2.5 mg, 2.5 mg, Nebulization, Q4H PRN, Pabon, Diego F, MD .  calamine lotion, , Topical, PRN, Jules Husbands, MD, 1 application at 24/40/10 1429 .  chlorhexidine (PERIDEX) 0.12 % solution 15 mL, 15 mL, Mouth Rinse, BID, Darel Hong D, NP, 15 mL at 08/03/20 2144 .  docusate (COLACE) 50 MG/5ML liquid 100 mg, 100 mg, Oral, BID, Benita Gutter, RPH, 100 mg at 08/03/20 2145 .  enoxaparin (LOVENOX) injection 70 mg, 0.5 mg/kg, Subcutaneous, Q24H, Shawna Clamp, MD, 70 mg at 08/03/20 1424 .  feeding supplement (ENSURE ENLIVE / ENSURE PLUS) liquid 237 mL, 237 mL, Oral, TID BM, Aleskerov, Fuad, MD, 237 mL at 08/03/20 2000 .  feeding supplement (PROSource TF) liquid 45 mL, 45 mL, Per Tube, TID, Aleskerov, Fuad, MD, 45 mL at 08/03/20 1720 .  fentaNYL (DURAGESIC) 100 MCG/HR 1 patch, 1 patch, Transdermal, Q72H, Pabon, Diego F, MD, 1 patch at 08/01/20 1616 .  folic acid (FOLVITE) tablet 1 mg, 1 mg, Oral, Daily, Benita Gutter, RPH, 1 mg at 08/03/20 1056 .  gabapentin (NEURONTIN) capsule 600 mg, 600 mg, Oral, Q8H, Dallie Piles, RPH, 600 mg at 08/04/20 0501 .  haloperidol (HALDOL) tablet 1 mg, 1 mg, Oral, Q6H PRN, 1 mg at 08/02/20 0225 **OR** haloperidol lactate (HALDOL) injection 1 mg, 1  mg, Intramuscular, Q6H PRN, Benita Gutter, RPH, 1 mg at 07/31/20 0127 .  HYDROmorphone (DILAUDID) injection 1 mg, 1 mg, Intravenous, Q2H PRN, Pabon, Diego F, MD, 1 mg at 08/04/20 0132 .  HYDROmorphone (DILAUDID) tablet 4 mg, 4 mg, Oral, Once, Shalhoub, Sherryll Burger, MD .  LORazepam (ATIVAN) tablet 0.5 mg, 0.5 mg, Oral, Q4H PRN, Shelly Coss, MD, 0.5 mg at 08/03/20 2144 .  magnesium oxide (MAG-OX) tablet 400 mg, 400 mg, Oral, BID, Dallie Piles, RPH, 400 mg at 08/03/20 2144 .  melatonin tablet 5 mg, 5 mg, Oral, QHS, Benita Gutter, RPH, 5 mg at 08/03/20 2144 .  multivitamin with minerals tablet 1 tablet, 1 tablet, Oral, Daily, Dallie Piles, RPH, 1 tablet at 08/03/20 1056 .  nystatin (MYCOSTATIN/NYSTOP) topical powder, ,  Topical, BID, Jules Husbands, MD, Given at 08/03/20 1056 .  OLANZapine (ZYPREXA) tablet 5 mg, 5 mg, Oral, QID PRN, Benita Gutter, RPH, 5 mg at 08/03/20 1055 .  oxyCODONE (Oxy IR/ROXICODONE) immediate release tablet 5 mg, 5 mg, Oral, Q6H PRN, Shelly Coss, MD, 5 mg at 08/04/20 0501 .  pantoprazole sodium (PROTONIX) 40 mg/20 mL oral suspension 40 mg, 40 mg, Oral, Daily, Benita Gutter, RPH, 40 mg at 08/01/20 8466 .  polyethylene glycol (MIRALAX / GLYCOLAX) packet 17 g, 17 g, Oral, Daily, Benita Gutter, RPH, 17 g at 08/03/20 1057 .  polyvinyl alcohol (LIQUIFILM TEARS) 1.4 % ophthalmic solution 1 drop, 1 drop, Both Eyes, BID, Pabon, Diego F, MD, 1 drop at 08/03/20 2144 .  QUEtiapine (SEROQUEL) tablet 100 mg, 100 mg, Oral, BID, Benita Gutter, RPH, 100 mg at 08/03/20 2144 .  scopolamine (TRANSDERM-SCOP) 1 MG/3DAYS 1.5 mg, 1 patch, Transdermal, Q72H, Kasa, Kurian, MD, 1.5 mg at 08/01/20 1229 .  thiamine tablet 100 mg, 100 mg, Oral, Daily, Benita Gutter, RPH, 100 mg at 08/03/20 1057 .  valproic acid (DEPAKENE) 250 MG/5ML solution 500 mg, 500 mg, Oral, BID, Dallie Piles, RPH, 500 mg at 08/03/20 2144 .  vitamin B-12 (CYANOCOBALAMIN) tablet 1,000 mcg, 1,000 mcg,  Oral, Daily, Benita Gutter, RPH, 1,000 mcg at 08/03/20 1056  Patients Current Diet:     Diet Order                  DIET DYS 3 Room service appropriate? Yes with Assist; Fluid consistency: Thin  Diet effective now                  Precautions / Restrictions Precautions Precautions: Fall Precaution Comments: trach collar/Passy muir Restrictions Weight Bearing Restrictions: No   Has the patient had 2 or more falls or a fall with injury in the past year? Yes  Prior Activity Level Community (5-7x/wk): fully independent prior to admit, driving, no DME at baseline  Prior Functional Level Self Care: Did the patient need help bathing, dressing, using the toilet or eating? Independent  Indoor Mobility: Did the patient need assistance with walking from room to room (with or without device)? Independent  Stairs: Did the patient need assistance with internal or external stairs (with or without device)? Independent  Functional Cognition: Did the patient need help planning regular tasks such as shopping or remembering to take medications? Independent  Home Assistive Devices / Equipment Home Assistive Devices/Equipment: None Home Equipment: None  Prior Device Use: Indicate devices/aids used by the patient prior to current illness, exacerbation or injury? None of the above  Current Functional Level Cognition  Overall Cognitive Status: Impaired/Different from baseline Difficult to assess due to: Tracheostomy Orientation Level: Oriented to person,Oriented to place,Disoriented to time,Disoriented to situation Following Commands: Follows one step commands with increased time Safety/Judgement: Decreased awareness of safety,Decreased awareness of deficits General Comments: Pt is alert but easily distracted. Word salad in conversations. Requires re-direction throughout to attend to task. [employer] Scales of Cognitive Functioning: Confused/appropriate     Extremity Assessment (includes Sensation/Coordination)  Upper Extremity Assessment: Generalized weakness  Lower Extremity Assessment: Generalized weakness    ADLs  Overall ADL's : Needs assistance/impaired Eating/Feeding: Set up,Sitting Eating/Feeding Details (indicate cue type and reason): to take drink from cup with straw/lid Grooming: Wash/dry face,Supervision/safety,Set up,Min guard,Standing,Cueing for sequencing Grooming Details (indicate cue type and reason): sink-side with OT cueing for sequence of task and PT cueing pt for  balance/safety. Upper Body Bathing: Sitting,Cueing for safety,Minimal assistance Lower Body Bathing: Moderate assistance,Sitting/lateral leans,Cueing for safety Upper Body Dressing : Set up,Minimal assistance,Sitting Lower Body Dressing: Minimal assistance,Sitting/lateral leans,Cueing for sequencing Lower Body Dressing Details (indicate cue type and reason): to don socks while seated EOB, needs FWD chaining to successfully complete task with OT completing first step and pt able to then better conceptualize subsequent steps of ADL task. Toilet Transfer: Moderate assistance,BSC,Stand-pivot,+2 for safety/equipment Toileting- Clothing Manipulation and Hygiene: Sit to/from stand,Cueing for sequencing,Cueing for safety,Moderate assistance Functional mobility during ADLs: Min guard,Rolling walker (to complete lap around the unit. OT cueing for posture/negotiating pathway while PT cueing pt for safe use of RW and balance.) General ADL Comments: Functional mobility assessment limited, pt declines STS transfers at this time. Performs bed mobility with min A for BLE mgt. Variable assist to maintain seated balance at EOB, occasionally requiring +2 to maintain sitting balance.  Anticipate MOD A with +2 for safety/chair follow for functional mobility.    Mobility  Overal bed mobility: Modified Independent Bed Mobility: Supine to Sit,Sit to Supine Supine to sit: Min  guard,HOB elevated Sit to supine: Min guard General bed mobility comments: Needs close supervision/CGA at times due to impulsivity    Transfers  Overall transfer level: Needs assistance Equipment used: None,Rolling walker (2 wheeled) Transfers: Sit to/from Stand Sit to Stand: +2 safety/equipment,Min guard,Supervision General transfer comment: +2 person for safety. lifting assistance required for standing.    Ambulation / Gait / Stairs / Wheelchair Mobility  Ambulation/Gait Ambulation/Gait assistance: Counsellor (Feet): 160 Feet Assistive device: Rolling walker (2 wheeled),None Gait Pattern/deviations: Staggering left,Staggering right General Gait Details: Pt ambulated ~ 50 ft without AD with very unsteady gait kinematics. Ambulated ~ 120 ft ith RW without LOB however still has impulsivity that makes him fall risk. Gait velocity: WNL    Posture / Balance Dynamic Sitting Balance Sitting balance - Comments: no gross loss of balance. close stand by assistance to Min guard provided for safety due to impulsive behavior Balance Overall balance assessment: Needs assistance Sitting-balance support: Feet supported Sitting balance-Leahy Scale: Fair Sitting balance - Comments: no gross loss of balance. close stand by assistance to [place] guard provided for safety due to impulsive behavior Postural control: Posterior lean,Right lateral lean Standing balance support: During functional activity,No upper extremity supported,Bilateral upper extremity supported Standing balance-Leahy Scale: Poor Standing balance comment: Poor balance without BUE support. has staggering L/R with ambulation. Improved to F with B UE support of RW    Special needs/care consideration Trach size 8, Diabetic management yes and Behavioral consideration encephalopathy, CIWA                                                              Trach #8 cuffed use of PMSV on room air                                                              1:1 safety sitter  Previous Home Environment (from acute therapy documentation) Living Arrangements: Parent,Children Available Help at Discharge: Family,Available 24 hours/day Type of Home:  House Home Layout: One level Home Access: Stairs to enter Entrance Stairs-Rails: Left,Right,Can reach both Technical brewer of Steps: 2 Bathroom Shower/Tub: Engineer, petroleum: Standard Home Care Services: No Additional Comments: gathered from OT assessment, pt communicating mostly via writting  Discharge Living Setting Plans for Discharge Living Setting: Lives with (comment) (mom and 31 y/o daughter) Type of Home at Discharge: House Discharge Home Layout: One level Discharge Home Access: Stairs to enter Entrance Stairs-Rails: Field seismologist of Steps: 2-3 Discharge Bathroom Shower/Tub: Tub/shower unit Discharge Bathroom Toilet: Standard Discharge Bathroom Accessibility: Yes How Accessible: Accessible via walker Does the patient have any problems obtaining your medications?: No  Social/Family/Support Systems Patient Roles: Parent (daughter is 48) Anticipated Caregiver: mom, Lenn Volker Anticipated Caregiver's Contact Information: [phone] Ability/Limitations of Caregiver: n/a Caregiver Availability: 24/7 Discharge Plan Discussed with Primary Caregiver: Yes Is Caregiver In Agreement with Plan?: Yes Does Caregiver/Family have Issues with Lodging/Transportation while Pt is in Rehab?: No  Goals Patient/Family Goal for Rehab: PT/OT supervision to mod I, SLP mod I Expected length of stay: 12-16 days Additional Information: MAF, no SNF benefits Pt/Family Agrees to Admission and willing to participate: Yes Program Orientation Provided & Reviewed with Pt/Caregiver Including Roles  & Responsibilities: Yes  Barriers to Discharge: Insurance for SNF coverage  Decrease burden of Care through IP rehab admission:  n/a  Possible need for SNF placement upon discharge: not anticipated, no SNF benefits  Patient Condition: I have reviewed medical records from [facility], spoken with CSW, and family member. I discussed via phone for inpatient rehabilitation assessment.  Patient will benefit from ongoing PT, OT and SLP, can actively participate in 3 hours of therapy a day 5 days of the week, and can make measurable gains during the admission.  Patient will also benefit from the coordinated team approach during an Inpatient Acute Rehabilitation admission.  The patient will receive intensive therapy as well as Rehabilitation physician, nursing, social worker, and care management interventions.  Due to bladder management, bowel management, safety, skin/wound care, disease management, medication administration, pain management and patient education the patient requires 24 hour a day rehabilitation nursing.  The patient is currently min assist to min guard with mobility and basic ADLs.  Discharge setting and therapy post discharge at home with home health is anticipated.  Patient has agreed to participate in the Acute Inpatient Rehabilitation Program and will admit today.  Preadmission Screen Completed By: Shann Medal, PT, DPT, 9:57 AM , 08/04/20 ______________________________________________________________________   Discussed status with Dr. Posey Pronto on 08/04/20  at 9:57 AM  and received approval for admission today.  Admission Coordinator: Michel Santee, PT, time 9:57 AM Sudie Grumbling 08/04/20    Assessment/Plan: Diagnosis: Debility 1. Does the need for close, 24 hr/day Medical supervision in concert with the patient's rehab needs make it unreasonable for this patient to be served in a less intensive setting? Potentially 2. Co-Morbidities requiring supervision/potential complications: morbid obesity (encourage weight loss), hepatitis C (order LFTs, avoid hepatotoxic meds), alcohol/polysubstance/tobacco abuse (counsel when  appropriate) 3. Due to safety, disease management, medication administration and patient education, does the patient require 24 hr/day rehab nursing? Yes 4. Does the patient require coordinated care of a physician, rehab nurse, PT, OT, and SLP to address physical and functional deficits in the context of the above medical diagnosis(es)? Potentially Addressing deficits in the following areas: balance, endurance, locomotion, strength, transferring, bathing, dressing, toileting, cognition and psychosocial support 5. Can the patient actively participate in an intensive therapy program of at least 3  hrs of therapy 5 days a week? Yes 6. The potential for patient to make measurable gains while on inpatient rehab is excellent 7. Anticipated functional outcomes upon discharge from inpatient rehab: modified independent and supervision PT, modified independent and supervision OT, supervision SLP 8. Estimated rehab length of stay to reach the above functional goals is: 3-6 days. 9. Anticipated discharge destination: Home 10. Overall Rehab/Functional Prognosis: good   MD Signature: Delice Lesch, MD, ABPMR          Revision History                                                 Note Details  Author Jamse Arn, MD File Time 08/04/2020 10:06 AM  Author Type Physician Status Signed  Last Editor Jamse Arn, MD Service Physical Medicine and [place] # [id number] Admit Date 08/04/2020

## 2020-08-04 NOTE — H&P (Addendum)
Physical Medicine and Rehabilitation Admission H&P    Chief Complaint  Patient presents with  . Altered Mental Status  . Alcohol Intoxication  : HPI: Nicholas Valdez is a 39 year old right-handed male with history significant for obesity with BMI 44.98, hepatitis C as well as alcohol/polysubstance /tobacco abuse.  History taken from chart review and patient.  Patient lives with parent and 83 year old daughter.  Independent prior to admission.  1 level home 2 steps to entry.  Independent at baseline.  He presented on 06/19/2020 to [facility] for inpatient detox but was found to be confused with nausea and was sent to the emergency department.  Patient was seen in the emergency department approximately 2 weeks prior for alcohol withdrawal and started on Librium protocol.  Cranial CT unremarkable for acute intracranial process.  Admission chemistries glucose 137 calcium 7.8, hemoglobin 13.3, alcohol 15, ammonia 76, lactic acid 3.3, CK 138.  He was emergently intubated for airway protection.  Patient with prolonged ICU course continued to have alcohol withdrawal agitation, requiring Precedex drip.  Required tracheostomy on 07/10/2020 per Dr. Bud Face with #8 cuffed Shiley placed as well as placement of gastrostomy tube for nutritional support on 07/22/2020 per Dr. Sterling Big.  Tracheal aspirate Elizabethkingia Meningoseptica and placed on broad-spectrum antibiotics which were completed on 08/01/2020. There is no current plan to downsize this patient remained with #8 Shiley trach as well as speech therapy working with Passy-Muir valve.  Patient has required sitters for agitation and restlessness and remains on Seroquel as well as Zyprexa/valproic acid.  He is tolerating a mechanical soft thin liquid diet.  Maintained on Lovenox for DVT prophylaxis.  Hospital course: Complicated by acute on chronic anemia with latest hemoglobin of 9.9 and chemistries unremarkable as of 08/02/2020.  Due to decreased  functional mobility and necessity for assistance with ADLs, patient was admitted due to patient decreasing functional mobility need for assistance for ADLs he was admitted for a comprehensive rehabilitation program.  Please see preadmission assessment from earlier today as well.  Review of Systems  Constitutional: Positive for malaise/fatigue. Negative for chills and fever.  HENT: Negative for hearing loss.   Eyes: Negative for blurred vision and double vision.  Respiratory: Positive for cough and shortness of breath.   Cardiovascular: Positive for chest pain (MSK) and leg swelling. Negative for palpitations.  Gastrointestinal: Positive for constipation. Negative for nausea and vomiting.       GERD  Genitourinary: Negative for dysuria, flank pain and hematuria.  Musculoskeletal: Positive for joint pain and myalgias.  Skin: Negative for rash.  Neurological: Positive for weakness. Negative for speech change and focal weakness.  Psychiatric/Behavioral: Positive for depression. The patient has insomnia.        Anxiety  All other systems reviewed and are negative.  Past Medical History:  Diagnosis Date  . Alcohol abuse   . Anxiety   . Depression   . Drug abuse (HCC)   . GERD (gastroesophageal reflux disease)   . Hepatitis C   . Hypertension   . Pneumonia   . Pre-diabetes   . Sleep apnea    Past Surgical History:  Procedure Laterality Date  . GASTROSTOMY W/ FEEDING TUBE Left 07/22/2020  . TRACHEOSTOMY TUBE PLACEMENT N/A 07/10/2020   Procedure: TRACHEOSTOMY;  Surgeon: Bud Face, MD;  Location: [facility] ORS;  Service: ENT;  Laterality: N/A;   Family History  Problem Relation Age of Onset  . Hypertension Other    Social History:  reports that he has been  smoking. He has never used smokeless tobacco. He reports current alcohol use. He reports current drug use. Drug: Marijuana. Allergies:  Allergies  Allergen Reactions  . Ondansetron Hives  . Penicillins Hives    Did it involve  swelling of the face/tongue/throat, SOB, or low BP? Yes Did it involve sudden or severe rash/hives, skin peeling, or any reaction on the inside of your mouth or nose? Yes Did you need to seek medical attention at a hospital or doctor's office? Yes When did it last happen?childhood If all above answers are "NO", may proceed with cephalosporin use.  Patient tolerated Ancef administration well  . Promethazine Hives   Medications Prior to Admission  Medication Sig Dispense Refill  . escitalopram (LEXAPRO) 20 MG tablet Take 20 mg by mouth daily.    Marland Kitchen gabapentin (NEURONTIN) 300 MG capsule Take 2 capsules (600 mg total) by mouth every 8 (eight) hours. (Patient taking differently: Take 300 mg by mouth 4 (four) times daily.)    . docusate (COLACE) 50 MG/5ML liquid Take 10 mLs (100 mg total) by mouth 2 (two) times daily. (Patient not taking: Reported on 08/04/2020) 100 mL 0  . feeding supplement (ENSURE ENLIVE / ENSURE PLUS) LIQD Take 237 mLs by mouth 3 (three) times daily between meals. 237 mL 12  . fentaNYL (DURAGESIC) 100 MCG/HR Place 1 patch onto the skin every 3 (three) days. 5 patch 0  . folic acid (FOLVITE) 1 MG tablet Take by mouth. (Patient not taking: Reported on 06/20/2020)    . [START ON 08/05/2020] folic acid (FOLVITE) 1 MG tablet Take 1 tablet (1 mg total) by mouth daily.    . haloperidol (HALDOL) 1 MG tablet Take 1 tablet (1 mg total) by mouth every 6 (six) hours as needed for agitation.    Marland Kitchen LORazepam (ATIVAN) 0.5 MG tablet Take 1 tablet (0.5 mg total) by mouth every 4 (four) hours as needed for anxiety. 30 tablet 0  . magnesium oxide (MAG-OX) 400 (241.3 Mg) MG tablet Take 1 tablet (400 mg total) by mouth 2 (two) times daily.    Melene Muller ON 08/05/2020] Multiple Vitamin (MULTIVITAMIN WITH MINERALS) TABS tablet Take 1 tablet by mouth daily.    . Nutritional Supplements (FEEDING SUPPLEMENT, PROSOURCE TF,) liquid Place 45 mLs into feeding tube 3 (three) times daily.    Marland Kitchen nystatin  (MYCOSTATIN/NYSTOP) powder Apply topically 2 (two) times daily. 15 g 0  . OLANZapine (ZYPREXA) 5 MG tablet Take 1 tablet (5 mg total) by mouth 4 (four) times daily as needed (agitation).    Marland Kitchen oxyCODONE (OXY IR/ROXICODONE) 5 MG immediate release tablet Take 1 tablet (5 mg total) by mouth every 6 (six) hours as needed for severe pain. 30 tablet 0  . [START ON 08/05/2020] pantoprazole sodium (PROTONIX) 40 mg/20 mL PACK Take 20 mLs (40 mg total) by mouth daily. 30 mL   . [START ON 08/05/2020] polyethylene glycol (MIRALAX / GLYCOLAX) 17 g packet Take 17 g by mouth daily. 14 each 0  . QUEtiapine (SEROQUEL) 100 MG tablet Take 1 tablet (100 mg total) by mouth 2 (two) times daily.    Marland Kitchen scopolamine (TRANSDERM-SCOP) 1 MG/3DAYS Place 1 patch (1.5 mg total) onto the skin every 3 (three) days. 10 patch 12  . Sofosbuvir-Velpatasvir 400-100 MG TABS Take 1 tablet by mouth daily.    Melene Muller ON 08/05/2020] thiamine 100 MG tablet Take 1 tablet (100 mg total) by mouth daily.    Marland Kitchen valproic acid (DEPAKENE) 250 MG/5ML solution Take 10 mLs (  500 mg total) by mouth 2 (two) times daily. 600 mL   . [START ON 08/05/2020] vitamin B-12 1000 MCG tablet Take 1 tablet (1,000 mcg total) by mouth daily.      Drug Regimen Review Drug regimen was reviewed and remains appropriate with no significant issues identified  Home: Home Living Family/patient expects to be discharged to:: Private residence Living Arrangements: Parent,Children Available Help at Discharge: Family,Available 24 hours/day Type of Home: House Home Access: Stairs to enter [employer] of Steps: 2 Entrance Stairs-Rails: Left,Right,Can reach both Home Layout: One level Bathroom Shower/Tub: Teacher, early years/pre: Standard Home Equipment: None Additional Comments: gathered from OT assessment, pt communicating mostly via writting   Functional History: Prior Function Level of Independence: Independent Comments: Per pt/mother, pt is totally  independent at baseline. Living with his parents and 66 y.o. daughter. Does not using AE for functional mobility.  Functional Status:  Mobility: Bed Mobility Overal bed mobility: Modified Independent Bed Mobility: Supine to Sit,Sit to Supine Supine to sit: Min guard,HOB elevated Sit to supine: Min guard General bed mobility comments: Needs close supervision/CGA at times due to impulsivity Transfers Overall transfer level: Needs assistance Equipment used: None Transfers: Sit to/from Stand Sit to Stand: Min guard General transfer comment: CGA due to pt's poor safety awarenss/impulsivity Ambulation/Gait Ambulation/Gait assistance: Min guard Gait Distance (Feet): 200 Feet (seated rest after 200 ft) Assistive device: None (gait belt) Gait Pattern/deviations: Staggering left,Staggering right General Gait Details: HR qyuickly elevated to 130s with ambulation Gait velocity: WNL Stairs: Yes Stairs assistance: Min guard Stair Management: One rail Right Number of Stairs: 4    ADL: ADL Overall ADL's : Needs assistance/impaired Eating/Feeding: Set up,Sitting Eating/Feeding Details (indicate cue type and reason): to take drink from cup with straw/lid Grooming: Wash/dry face,Supervision/safety,Set up,Min guard,Standing,Cueing for sequencing Grooming Details (indicate cue type and reason): sink-side with OT cueing for sequence of task and PT cueing pt for balance/safety. Upper Body Bathing: Sitting,Cueing for safety,Minimal assistance Lower Body Bathing: Moderate assistance,Sitting/lateral leans,Cueing for safety Upper Body Dressing : Set up,Minimal assistance,Sitting Lower Body Dressing: Minimal assistance,Sitting/lateral leans,Cueing for sequencing Lower Body Dressing Details (indicate cue type and reason): to don socks while seated EOB, needs FWD chaining to successfully complete task with OT completing first step and pt able to then better conceptualize subsequent steps of ADL  task. Toilet Transfer: Moderate assistance,BSC,Stand-pivot,+2 for safety/equipment Toileting- Clothing Manipulation and Hygiene: Sit to/from stand,Cueing for sequencing,Cueing for safety,Moderate assistance Functional mobility during ADLs: Min guard,Rolling walker (to complete lap around the unit. OT cueing for posture/negotiating pathway while PT cueing pt for safe use of RW and balance.) General ADL Comments: Functional mobility assessment limited, pt declines STS transfers at this time. Performs bed mobility with min A for BLE mgt. Variable assist to maintain seated balance at EOB, occasionally requiring +2 to maintain sitting balance.  Anticipate MOD A with +2 for safety/chair follow for functional mobility.  Cognition: Cognition Overall Cognitive Status: No family/caregiver present to determine baseline cognitive functioning Orientation Level: Oriented to person,Oriented to place,Disoriented to time,Disoriented to situation [employer] Scales of Cognitive Functioning: Confused/appropriate Cognition Arousal/Alertness: Awake/alert Behavior During Therapy: Impulsive,WFL for tasks assessed/performed Overall Cognitive Status: No family/caregiver present to determine baseline cognitive functioning Area of Impairment: Following commands,Safety/judgement,Awareness,Problem solving,Orientation Orientation Level: Time,Situation,Place,Disoriented to Following Commands: Follows one step commands with increased time Safety/Judgement: Decreased awareness of safety,Decreased awareness of deficits Awareness: Intellectual Problem Solving: Slow processing,Decreased initiation,Difficulty sequencing,Requires verbal cues,Requires tactile cues General Comments: Pt was A and cooperative. continues to  be impulsive at times throughout session. Difficult to assess due to: Tracheostomy  Physical Exam: Blood pressure (!) 148/87, pulse (!) 102, temperature 98.6 F (37 C), temperature source Oral, resp. rate  (!) 22, height 5\' 10"  (1.778 m), weight (!) 142.2 kg, SpO2 99 %. Physical Exam Vitals reviewed.  Constitutional:      General: He is not in acute distress.    Appearance: He is obese.  HENT:     Head: Normocephalic and atraumatic.     Right Ear: External ear normal.     Left Ear: External ear normal.     Nose: Nose normal.  Eyes:     General:        Right eye: No discharge.        Left eye: No discharge.     Extraocular Movements: Extraocular movements intact.  Cardiovascular:     Rate and Rhythm: Regular rhythm. Tachycardia present.  Pulmonary:     Breath sounds: Wheezing present.     Comments: Increased WOB Abdominal:     General: Abdomen is flat. Bowel sounds are normal. There is no distension.  Musculoskeletal:        General: No tenderness.     Cervical back: Normal range of motion and neck supple.     Comments: LE edema  Skin:    General: Skin is warm and dry.  Neurological:     Mental Status: He is alert.     Comments: Alert Motor: 4/5 throughout  Psychiatric:        Attention and Perception: Attention normal.        Mood and Affect: Mood normal.        Behavior: Behavior normal.     Results for orders placed or performed during the hospital encounter of 06/19/20 (from the past 48 hour(s))  Glucose, capillary     Status: Abnormal   Collection Time: 08/02/20  4:04 PM  Result Value Ref Range   Glucose-Capillary 115 (H) 70 - 99 mg/dL    Comment: Glucose reference range applies only to samples taken after fasting for at least 8 hours.  Magnesium     Status: None   Collection Time: 08/03/20  5:11 AM  Result Value Ref Range   Magnesium 1.8 1.7 - 2.4 mg/dL    Comment: Performed at [facility], [address] [place], [place]  Phosphorus     Status: Abnormal   Collection Time: 08/03/20  5:11 AM  Result Value Ref Range   Phosphorus 5.0 (H) 2.5 - 4.6 mg/dL    Comment: Performed at [facility], [address] [place], [place]  [place]  Basic metabolic panel     Status: Abnormal   Collection Time: 08/03/20  5:11 AM  Result Value Ref Range   Sodium 138 135 - 145 mmol/L   Potassium 4.0 3.5 - 5.1 mmol/L   Chloride 101 98 - 111 mmol/L   CO2 27 22 - 32 mmol/L   Glucose, Bld 119 (H) 70 - 99 mg/dL    Comment: Glucose reference range applies only to samples taken after fasting for at least 8 hours.   BUN 10 6 - 20 mg/dL   Creatinine, Ser 08/05/20 0.61 - 1.24 mg/dL   Calcium 8.9 8.9 - 3.38 mg/dL   GFR, Estimated 25.0 >53 mL/min    Comment: (NOTE) Calculated using the CKD-EPI Creatinine Equation (2021)    Anion gap 10 5 - 15    Comment: Performed at [facility],  [address] [place], [place] [address]  Magnesium     Status: None   Collection Time: 08/04/20  3:45 AM  Result Value Ref Range   Magnesium 2.0 1.7 - 2.4 mg/dL    Comment: Performed at [facility], [address] [place], [place] [address]  Phosphorus     Status: Abnormal   Collection Time: 08/04/20  3:45 AM  Result Value Ref Range   Phosphorus 5.5 (H) 2.5 - 4.6 mg/dL    Comment: Performed at [facility], [address] [place], [place] [address]  Basic metabolic panel     Status: Abnormal   Collection Time: 08/04/20  3:45 AM  Result Value Ref Range   Sodium 136 135 - 145 mmol/L   Potassium 3.8 3.5 - 5.1 mmol/L   Chloride 99 98 - 111 mmol/L   CO2 26 22 - 32 mmol/L   Glucose, Bld 139 (H) 70 - 99 mg/dL    Comment: Glucose reference range applies only to samples taken after fasting for at least 8 hours.   BUN 7 6 - 20 mg/dL   Creatinine, Ser 3.47 0.61 - 1.24 mg/dL   Calcium 8.8 (L) 8.9 - 10.3 mg/dL   GFR, Estimated >42 >59 mL/min    Comment: (NOTE) Calculated using the CKD-EPI Creatinine Equation (2021)    Anion gap 11 5 - 15    Comment: Performed at [facility], [address] [place], [place] [address]  Glucose, capillary     Status: Abnormal   Collection Time: 08/04/20  5:03 AM  Result Value Ref  Range   Glucose-Capillary 138 (H) 70 - 99 mg/dL    Comment: Glucose reference range applies only to samples taken after fasting for at least 8 hours.   No results found.     Medical Problem List and Plan: 1.  Acute hypoxic respiratory failure secondary to acute encephalopathy due to alcohol withdrawal  -patient may shower  -ELOS/Goals: 3-5 days/supervision/mod I  Admit to CIR 2.  Antithrombotics: -DVT/anticoagulation: Lovenox  -antiplatelet therapy: N/A 3. Pain Management: Neurontin 600 mg every 8 hours, Duragesic patch, oxycodone as needed  Monitor with increased exertion 4. Mood: Valproic acid 500 mg twice daily  -antipsychotic agents: Seroquel 100 mg twice daily Zyprexa 5 mg 4 times daily as needed, Haldol 1 mg every 6 as needed 5. Neuropsych: This patient is not capable of making decisions on his own behalf. 6. Skin/Wound Care: Routine skin checks 7. Fluids/Electrolytes/Nutrition: Routine in and outs 8.  Respiratory failure: Tracheostomy 07/10/2020 per Dr. Bud Face.  Currently with #8 cuffed Shiley with no plan to downsize and follow-up outpatient  Duoneb PRN 9.  Gastrostomy tube 07/22/2020 per Dr. Sterling Big.  Patient currently on mechanical soft thin liquid diet. 10.  Obesity.  BMI 44.98.  Dietary follow-up.  Encourage weight loss 11.  History of tobacco/alcohol polysubstance abuse.  Counseled on appropriate. 12.  Acute on chronic anemia.    CBC ordered for tomorrow. 13.  Constipation.  MiraLAX daily 14.  Sleep disturbance  Melatonin 5 mg nightly  Charlton Amor, PA-C 08/04/2020  I have personally performed a face to face diagnostic evaluation, including, but not limited to relevant history and physical exam findings, of this patient and developed relevant assessment and plan.  Additionally, I have reviewed and concur with the physician assistant's documentation above.  Maryla Morrow, MD, ABPMR  The patient's status has not changed. Any changes from the  pre-admission screening or documentation from the acute  chart are noted above.   Maryla Morrow, MD, ABPMR

## 2020-08-04 NOTE — Progress Notes (Addendum)
Patient ID: Nicholas Valdez, male   DOB: 09-Apr-1981, 39 y.o.   MRN: [id number] Pt arrived on unit to 4W06. Pt oriented to rehab. Policies, medications, histories, and plan of care reviewed. Pt educated on smoking cessation, pt irritated and cutting education short but in agreement. No other concerns. Pt educated on fall prevention and use of call light.  Mylo Red, LPN

## 2020-08-05 ENCOUNTER — Inpatient Hospital Stay (HOSPITAL_COMMUNITY): Payer: Medicaid Other | Admitting: Physical Therapy

## 2020-08-05 ENCOUNTER — Inpatient Hospital Stay (HOSPITAL_COMMUNITY): Payer: Medicaid Other

## 2020-08-05 ENCOUNTER — Inpatient Hospital Stay (HOSPITAL_COMMUNITY): Payer: Medicaid Other | Admitting: Occupational Therapy

## 2020-08-05 DIAGNOSIS — G479 Sleep disorder, unspecified: Secondary | ICD-10-CM

## 2020-08-05 DIAGNOSIS — G9341 Metabolic encephalopathy: Secondary | ICD-10-CM

## 2020-08-05 DIAGNOSIS — R609 Edema, unspecified: Secondary | ICD-10-CM

## 2020-08-05 DIAGNOSIS — D649 Anemia, unspecified: Secondary | ICD-10-CM | POA: Diagnosis not present

## 2020-08-05 DIAGNOSIS — R4587 Impulsiveness: Secondary | ICD-10-CM

## 2020-08-05 LAB — CBC
HCT: 29.5 % — ABNORMAL LOW (ref 39.0–52.0)
Hemoglobin: 9.4 g/dL — ABNORMAL LOW (ref 13.0–17.0)
MCH: 32.1 pg (ref 26.0–34.0)
MCHC: 31.9 g/dL (ref 30.0–36.0)
MCV: 100.7 fL — ABNORMAL HIGH (ref 80.0–100.0)
Platelets: 321 10*3/uL (ref 150–400)
RBC: 2.93 MIL/uL — ABNORMAL LOW (ref 4.22–5.81)
RDW: 14.1 % (ref 11.5–15.5)
WBC: 7.5 10*3/uL (ref 4.0–10.5)
nRBC: 0 % (ref 0.0–0.2)

## 2020-08-05 LAB — CREATININE, SERUM
Creatinine, Ser: 0.75 mg/dL (ref 0.61–1.24)
GFR, Estimated: >60 mL/min (ref >60)

## 2020-08-05 LAB — GLUCOSE, CAPILLARY: Glucose-Capillary: 124 mg/dL — ABNORMAL HIGH (ref 70–99)

## 2020-08-05 MED ORDER — IPRATROPIUM-ALBUTEROL 0.5-2.5 (3) MG/3ML IN SOLN
3.0000 mL | Freq: Four times a day (QID) | RESPIRATORY_TRACT | Status: DC | PRN
  Administered 2020-08-05 – 2020-08-10 (×5): 3 mL via RESPIRATORY_TRACT
  Filled 2020-08-05 (×5): qty 3

## 2020-08-05 MED ORDER — CHLORDIAZEPOXIDE HCL 5 MG PO CAPS
5.0000 mg | ORAL_CAPSULE | Freq: Once | ORAL | Status: AC
  Administered 2020-08-05: 5 mg via ORAL
  Filled 2020-08-05: qty 1

## 2020-08-05 MED ORDER — DOCUSATE SODIUM 100 MG PO CAPS
100.0000 mg | ORAL_CAPSULE | Freq: Two times a day (BID) | ORAL | Status: DC
  Administered 2020-08-05 – 2020-08-11 (×12): 100 mg via ORAL
  Filled 2020-08-05 (×12): qty 1

## 2020-08-05 MED ORDER — FUROSEMIDE 20 MG PO TABS
20.0000 mg | ORAL_TABLET | Freq: Every day | ORAL | Status: DC
  Administered 2020-08-05 – 2020-08-09 (×5): 20 mg via ORAL
  Filled 2020-08-05 (×5): qty 1

## 2020-08-05 MED ORDER — IPRATROPIUM-ALBUTEROL 0.5-2.5 (3) MG/3ML IN SOLN
3.0000 mL | Freq: Two times a day (BID) | RESPIRATORY_TRACT | Status: DC
  Administered 2020-08-05 – 2020-08-08 (×7): 3 mL via RESPIRATORY_TRACT
  Filled 2020-08-05 (×7): qty 3

## 2020-08-05 MED ORDER — TRAZODONE HCL 50 MG PO TABS
50.0000 mg | ORAL_TABLET | Freq: Every day | ORAL | Status: DC
  Administered 2020-08-05 – 2020-08-10 (×5): 50 mg via ORAL
  Filled 2020-08-05 (×6): qty 1

## 2020-08-05 MED ORDER — VITAMIN B-12 1000 MCG PO TABS
2000.0000 ug | ORAL_TABLET | Freq: Every day | ORAL | Status: DC
  Administered 2020-08-06 – 2020-08-11 (×6): 2000 ug via ORAL
  Filled 2020-08-05 (×6): qty 2

## 2020-08-05 NOTE — Progress Notes (Signed)
[place] PHYSICAL MEDICINE & REHABILITATION PROGRESS NOTE  Subjective/Complaints: Patient seen sitting up in bed this morning.  He states he did not sleep well overnight because Seroquel does not work for him.  He perseverates on wanting a soda.  Discussed with nursing, patient repeatedly taking off pulse ox as well as removing IV.  ROS: Denies CP, SOB, N/V/D  Objective: Vital Signs: Blood pressure 111/84, pulse (!) 113, temperature 98.4 F (36.9 C), temperature source Oral, resp. rate 18, height 5\' 10"  (1.778 m), weight (!) 142.2 kg, SpO2 98 %. No results found. Recent Labs    08/05/20 0651  WBC 7.5  HGB 9.4*  HCT 29.5*  PLT 321   Recent Labs    08/03/20 0511 08/04/20 0345 08/05/20 0651  NA 138 136  --   K 4.0 3.8  --   CL 101 99  --   CO2 27 26  --   GLUCOSE 119* 139*  --   BUN 10 7  --   CREATININE 0.76 0.82 0.75  CALCIUM 8.9 8.8*  --     Intake/Output Summary (Last 24 hours) at 08/05/2020 1135 Last data filed at 08/05/2020 0737 Gross per 24 hour  Intake 240 ml  Output -  Net 240 ml     Pressure Injury 07/26/20 Heel Left;Lateral Deep Tissue Pressure Injury - Purple or maroon localized area of discolored intact skin or blood-filled blister due to damage of underlying soft tissue from pressure and/or shear. purple and pink blistered area  (Active)  07/26/20 0730  Location: Heel  Location Orientation: Left;Lateral  Staging: Deep Tissue Pressure Injury - Purple or maroon localized area of discolored intact skin or blood-filled blister due to damage of underlying soft tissue from pressure and/or shear.  Wound Description (Comments): purple and pink blistered area on L  lateral heel  Present on Admission: -- (possible mistake on chart, maybe prior R heel documented by mistake)    Physical Exam: BP 111/84 (BP Location: Right Arm)   Pulse (!) 113   Temp 98.4 F (36.9 C) (Oral)   Resp 18   Ht 5\' 10"  (1.778 m)   Wt (!) 142.2 kg   SpO2 98%   BMI 44.98 kg/m   Constitutional: No distress . Vital signs reviewed.  Morbidly obese. HENT: Normocephalic.  Atraumatic. Eyes: EOMI. No discharge. Cardiovascular: No JVD.  Tachycardia. Respiratory: Normal effort.  No stridor.  Bilateral clear to auscultation. GI: Non-distended.  BS +. Skin: Warm and dry.  Intact. Psych: Anxious.  Verbose. Musc: Lower extremity edema.  No tenderness in extremities. Neuro: Alert Motor: 4/5 throughout, unchanged  Assessment/Plan: 1. Functional deficits which require 3+ hours per day of interdisciplinary therapy in a comprehensive inpatient rehab setting.  Physiatrist is providing close team supervision and 24 hour management of active medical problems listed below.  Physiatrist and rehab team continue to assess barriers to discharge/monitor patient progress toward functional and medical goals   Care Tool:  Bathing    Body parts bathed by patient: Right arm,Left arm,Chest,Abdomen,Front perineal area,Buttocks,Right upper leg,Left upper leg,Right lower leg,Left lower leg,Face         Bathing assist Assist Level: Contact Guard/Touching assist     Upper Body Dressing/Undressing Upper body dressing   What is the patient wearing?: Pull over shirt    Upper body assist Assist Level: Contact Guard/Touching assist (standing)    Lower Body Dressing/Undressing Lower body dressing      What is the patient wearing?: Pants     Lower  body assist Assist for lower body dressing: Contact Guard/Touching assist     Toileting Toileting    Toileting assist Assist for toileting: Contact Guard/Touching assist     Transfers Chair/bed transfer  Transfers assist           Locomotion Ambulation   Ambulation assist      Assist level: Independent       Walk 10 feet activity   Assist           Walk 50 feet activity   Assist           Walk 150 feet activity   Assist           Walk 10 feet on uneven surface  activity   Assist            Wheelchair     Assist               Wheelchair 50 feet with 2 turns activity    Assist            Wheelchair 150 feet activity     Assist           Medical Problem List and Plan: 1.  Acute hypoxic respiratory failure secondary to acute encephalopathy due to alcohol withdrawal  Begin CIR evaluations 2.  Antithrombotics: -DVT/anticoagulation: Lovenox             -antiplatelet therapy: N/A 3. Pain Management: Neurontin 600 mg every 8 hours, Duragesic patch, oxycodone as needed             Monitor with increased exertion 4. Mood: Valproic acid 500 mg twice daily             -antipsychotic agents: Seroquel 100 mg twice daily Zyprexa 5 mg 4 times daily as needed, Haldol 1 mg every 6 as needed 5. Neuropsych: This patient is not capable of making decisions on his own behalf.  Telesitter for safety for impulsivity 6. Skin/Wound Care: Routine skin checks 7. Fluids/Electrolytes/Nutrition: Routine in and outs 8.  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 Bud Face.  Currently with #8 cuffed Shiley with no plan to downsize and follow-up outpatient             Duoneb PRN 9.  Gastrostomy tube 07/22/2020 per Dr. Sterling Big.  Patient currently on mechanical soft thin liquid diet. 10.  Obesity.  BMI 44.98.  Dietary follow-up.  Encourage weight loss 11.  History of tobacco/alcohol polysubstance abuse.  Counsel when appropriate. 12.  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               Hemoglobin 9.4 on 1/1  Vitamin B12 severely low, supplement initiated  Continue to monitor 13.  Constipation.  MiraLAX daily 14.  Sleep disturbance             Melatonin 5 mg nightly  ECG reviewed borderline prolonged QTC, trazodone 50 started on 1/1  Sleep chart ordered 15.  Peripheral edema  Lasix 20 daily started on 1/1     LOS: 1 days A FACE TO FACE EVALUATION WAS PERFORMED  Caine Barfield Karis Juba 08/05/2020, 11:35 AM

## 2020-08-05 NOTE — Evaluation (Signed)
Physical Therapy Assessment and Plan  Patient Details  Name: Nicholas Valdez MRN: [id number] Date of Birth: 1981/08/04  PT Diagnosis: Abnormality of gait, Cognitive deficits, Difficulty walking, Impaired cognition and Muscle weakness Rehab Potential: Good ELOS: 10-12 days   Today's Date: 08/05/2020 PT Individual Time: 4010-2725 and 3664-4034 PT Individual Time Calculation (min): 23 min and 32 min    Hospital Problem: Principal Problem:   Acute metabolic encephalopathy   Past Medical History:  Past Medical History:  Diagnosis Date  . Alcohol abuse   . Anxiety   . Depression   . Drug abuse ([place])   . GERD (gastroesophageal reflux disease)   . Hepatitis C   . Hypertension   . Pneumonia   . Pre-diabetes   . Sleep apnea    Past Surgical History:  Past Surgical History:  Procedure Laterality Date  . GASTROSTOMY W/ FEEDING TUBE Left 07/22/2020  . TRACHEOSTOMY TUBE PLACEMENT N/A 07/10/2020   Procedure: TRACHEOSTOMY;  Surgeon: Carloyn Manner, MD;  Location: [facility] ORS;  Service: ENT;  Laterality: N/A;    Assessment & Plan Clinical Impression: Patient is a 40 y.o. year old right-handed male with history significant for obesity with BMI 44.98, hepatitis C as well as alcohol/polysubstance /tobacco abuse.  History taken from chart review and patient.  Patient lives with parent and 103 year old daughter.  Independent prior to admission.  1 level home 2 steps to entry.  Independent at baseline.  He presented on 06/19/2020 to [facility] for inpatient detox but was found to be confused with nausea and was sent to the emergency department.  Patient was seen in the emergency department approximately 2 weeks prior for alcohol withdrawal and started on Librium protocol.  Cranial CT unremarkable for acute intracranial process.  Admission chemistries glucose 137 calcium 7.8, hemoglobin 13.3, alcohol 15, ammonia 76, lactic acid 3.3, CK 138.  He was emergently intubated for airway protection.  Patient with  prolonged ICU course continued to have alcohol withdrawal agitation, requiring Precedex drip.  Required tracheostomy on 07/10/2020 per Dr. Carloyn Manner with #8 cuffed Shiley placed as well as placement of gastrostomy tube for nutritional support on 07/22/2020 per Dr. Caroleen Hamman.  Tracheal aspirate Elizabethkingia Meningoseptica and placed on broad-spectrum antibiotics which were completed on 08/01/2020. There is no current plan to downsize this patient remained with #8 Shiley trach as well as speech therapy working with Passy-Muir valve.  Patient has required sitters for agitation and restlessness and remains on Seroquel as well as Zyprexa/valproic acid.  He is tolerating a mechanical soft thin liquid diet.  Maintained on Lovenox for DVT prophylaxis.  Hospital course: Complicated by acute on chronic anemia with latest hemoglobin of 9.9 and chemistries unremarkable as of 08/02/2020.  Due to decreased functional mobility and necessity for assistance with ADLs, patient was admitted due to patient decreasing functional mobility need for assistance for ADLs he was admitted for a comprehensive rehabilitation program.  Patient transferred to CIR on 08/04/2020 .   Patient currently requires min assist with mobility secondary to muscle weakness, decreased cardiorespiratoy endurance and decreased oxygen support, impaired timing and sequencing, unbalanced muscle activation and decreased motor planning,  , decreased initiation, decreased attention, decreased awareness, decreased problem solving, decreased safety awareness, decreased memory and delayed processing and decreased sitting balance, decreased standing balance, decreased postural control and decreased balance strategies.  Prior to hospitalization, patient was independent  with mobility and lived with Family (father Theadore Nan), mother [name]) and 13y.o. daughter) in a House home.  Home access is 2Stairs  to enter.  Patient will benefit from skilled PT intervention to  maximize safe functional mobility, minimize fall risk and decrease caregiver burden for planned discharge home with 24 hour supervision.  Anticipate patient will benefit from follow up OP at discharge.  PT - End of Session Activity Tolerance: Tolerates < 10 min activity, no significant change in vital signs Endurance Deficit: Yes Endurance Deficit Description: during eval unable to tolerate due to severe lethargy - in later session requires seated rest breaks due to elevated HR and labored breathing PT Assessment Rehab Potential (ACUTE/IP ONLY): Good PT Barriers to Discharge: Trach;Weight;Behavior;Nutrition means PT Patient demonstrates impairments in the following area(s): Balance;Perception;Behavior;Safety;Edema;Sensory;Endurance;Skin Integrity;Nutrition;Pain;Motor PT Transfers Functional Problem(s): Bed Mobility;Bed to Chair;Car;Furniture;Floor PT Locomotion Functional Problem(s): Ambulation;Stairs PT Plan PT Intensity: Minimum of 1-2 x/day ,45 to 90 minutes PT Frequency: 5 out of 7 days PT Duration Estimated Length of Stay: 10-12 days PT Treatment/Interventions: Ambulation/gait training;Community reintegration;DME/adaptive equipment instruction;Neuromuscular re-education;Psychosocial support;Stair training;UE/LE Strength taining/ROM;Balance/vestibular training;Discharge planning;Functional electrical stimulation;Pain management;Skin care/wound management;Therapeutic Activities;UE/LE Coordination activities;Cognitive remediation/compensation;Disease management/prevention;Functional mobility training;Patient/family education;Splinting/orthotics;Therapeutic Exercise;Visual/perceptual remediation/compensation PT Transfers Anticipated Outcome(s): supervision PT Locomotion Anticipated Outcome(s): supervision PT Recommendation Recommendations for Other Services: Neuropsych consult Follow Up Recommendations: Outpatient PT;24 hour supervision/assistance Patient destination: Home Equipment  Recommended: To be determined   PT Evaluation Precautions/Restrictions Precautions Precautions: Fall;Other (comment) Precaution Comments: capped trach monitor SpO2 and HR, PEG, decreased safety awareness with impulsivity and restlessness Restrictions Weight Bearing Restrictions: No Pain Pain Assessment Pain Scale: Faces Faces Pain Scale: No hurt Home Living/Prior Functioning Home Living Available Help at Discharge: Family;Available 24 hours/day (pt reports his mother is disabled) Type of Home: House Home Access: Stairs to enter [employer] of Steps: 2 Entrance Stairs-Rails: Right;Left;Can reach both Home Layout: One level  Lives With: Family (father Theadore Nan), mother [name]) and 13y.o. daughter) Prior Function Level of Independence: Independent with gait;Independent with transfers  Able to Take Stairs?: Yes Driving: No Vocation: Other (Comment) (not working) Comments: Per pt's father: pt independent with mobility w/o AD but reports pt's father and mother primarily care for pt's 13y.o. daughter; not driving Perception  Perception Perception: Within Functional Limits Praxis Praxis: Impaired Praxis Impairment Details: Motor planning;Perseveration  Cognition Overall Cognitive Status: Impaired/Different from baseline Arousal/Alertness: Lethargic Orientation Level: Oriented to person;Disoriented to place;Disoriented to time;Disoriented to situation Attention: Focused;Sustained;Selective Focused Attention: Impaired Sustained Attention: Impaired Selective Attention: Impaired Memory: Impaired Awareness: Impaired Problem Solving: Impaired Behaviors: Restless;Poor frustration tolerance;Impulsive;Perseveration;Lability;Agitated Behavior Scale Safety/Judgment: Impaired Sensation Sensation Light Touch: Not tested (pt unable to tolerate assessment due to impaired sustained attention and restlessness) Hot/Cold: Not tested Proprioception: Impaired by gross assessment  (noticed possible impaired proprioception by gait pattern) Stereognosis: Not tested Coordination Gross Motor Movements are Fluid and Coordinated: No Coordination and Movement Description: impaired due to generalized weakness and impaired balance Motor  Motor Motor: Other (comment);Motor perseverations;Abnormal postural alignment and control Motor - Skilled Clinical Observations: generalized weakness and impaired balance   Trunk/Postural Assessment  Cervical Assessment Cervical Assessment: Exceptions to [facility] (forward head) Thoracic Assessment Thoracic Assessment: Exceptions to [facility] (rounded shoulders) Lumbar Assessment Lumbar Assessment: Exceptions to [facility] (posterior pelvic tilt in sitting) Postural Control Postural Control: Deficits on evaluation Postural Limitations: decreased  Balance Balance Balance Assessed: Yes Static Sitting Balance Static Sitting - Balance Support: Feet supported Static Sitting - Level of Assistance: 4: Min assist;5: Stand by assistance Dynamic Sitting Balance Dynamic Sitting - Balance Support: Feet supported Dynamic Sitting - Level of Assistance: 4: Min assist Static Standing Balance Static Standing - Balance Support: During functional  activity Static Standing - Level of Assistance: 4: Min assist Dynamic Standing Balance Dynamic Standing - Balance Support: During functional activity Dynamic Standing - Level of Assistance: 4: Min assist Extremity Assessment      RLE Assessment RLE Assessment: Exceptions to [facility] General Strength Comments: not formally assessed but demonstrating grossly 4/5 to 4+/5 functionally LLE Assessment LLE Assessment: Exceptions to [facility] General Strength Comments: not formally assessed but demonstrating grossly 4/5 to 4+/5 functionally  Care Tool Care Tool Bed Mobility Roll left and right activity   Roll left and right assist level: Supervision/Verbal cueing    Sit to lying activity   Sit to lying assist level: Contact  Guard/Touching assist    Lying to sitting edge of bed activity   Lying to sitting edge of bed assist level: Contact Guard/Touching assist     Care Tool Transfers Sit to stand transfer Sit to stand activity did not occur: Safety/medical concerns      Chair/bed transfer Chair/bed transfer activity did not occur: Safety/medical concerns       Psychologist, counselling transfer activity did not occur: Safety/medical concerns        Care Tool Locomotion Ambulation Ambulation activity did not occur: Safety/medical concerns        Walk 10 feet activity Walk 10 feet activity did not occur: Safety/medical concerns       Walk 50 feet with 2 turns activity Walk 50 feet with 2 turns activity did not occur: Safety/medical concerns      Walk 150 feet activity Walk 150 feet activity did not occur: Safety/medical concerns      Walk 10 feet on uneven surfaces activity Walk 10 feet on uneven surfaces activity did not occur: Safety/medical concerns      Stairs Stair activity did not occur: Safety/medical concerns        Walk up/down 1 step activity Walk up/down 1 step or curb (drop down) activity did not occur: Safety/medical concerns     Walk up/down 4 steps activity did not occuR: Safety/medical concerns  Walk up/down 4 steps activity      Walk up/down 12 steps activity Walk up/down 12 steps activity did not occur: Safety/medical concerns      Pick up small objects from floor Pick up small object from the floor (from standing position) activity did not occur: Safety/medical concerns      Wheelchair Will patient use wheelchair at discharge?: No          Wheel 50 feet with 2 turns activity      Wheel 150 feet activity        Refer to Care Plan for Long Term Goals  SHORT TERM GOAL WEEK 1 PT Short Term Goal 1 (Week 1): Pt will ambulate at least 158f using LRAD with CGA PT Short Term Goal 2 (Week 1): Pt will ascend/descend 4 steps using HRs per home  set-up with CGA PT Short Term Goal 3 (Week 1): Pt will demonstrate increased safety awareness during functional mobility via initiating a seated rest break when noticed labored breathing with only min cues 50% of the time  Recommendations for other services: Neuropsych  Skilled Therapeutic Intervention Session 1: Limited evaluation completed (see details above) with pt/family education regarding purpose of PT evaluation, PT POC and goals, therapy schedule, weekly team meetings, and other CIR information including safety plan and fall risk safety. Pt received supine in bed, asleep. Trach capped and on continuous monitoring: SpO2  100% and HR 103bpm at rest. Pt difficult to awaken with verbal stimulus but once aroused opened eyes briefly resulting in sudden impulsivity with poor safety awareness via spontaneously and quickly moving to sit up on EOB with bedrail still in place - therapist able to calm and cue for slower, safer movements and remove bedrail to allow pt to sit EOB with CGA for safety. Once EOB, pt continues to remain lethargic keeping eyes closed and falling asleep causing his trunk to start tipping over, which would startle him awake for a brief moment enough to maintain upright with only min assist for trunk control and result in sudden impulsivity with pt starting to initiate transition to stand requiring cuing and redirection to remain sitting. Despite attempts at increasing pt alertness with continuous verbal and tactile stimulus as well as providing wash cloth to face, patient remained lethargic with only brief seconds of impulsive alertness with mumbled speech and short nonsensical phrases. Pt's father, Theadore Nan, arrived and reports pt with very little sleep last night calling his father around 5AM this morning. Therapist able to awaken patient enough to cue him to lie back down for safety with min assist for LE management into the bed. Pt immediately falling into a deep sleep - left supine in bed  with needs in reach, bed alarm on, and pt's father present. RN made aware of pt's lethargy and therapist will return at later time to make up missed time as schedule permits. Missed 52 minutes of skilled physical therapy.  Session 2: Pt received supine in bed awake and agreeable to therapy session. Pt demonstrates impaired awareness, restlessness, tangential speech, poor safety awareness, impulsivity, and impaired word finding to complete phrases. Pt initially perseverating on wanting another chocolate pudding though 2 empty puddings and 1 empty muffin wrapper on pt's bedside table - therapist encouraged pt to wait until dinner in ~1.5 hours with pt stating "that's like telling someone not to do all of the heroine because it might spoil their beer." Therapist able to redirect patient to OOB mobility. Supine>sitting R EOB with supervision for safety. Sit<>stands using HHA with CGA for steadying throughout session. SpO2 100% and HR 116bpm Gait training ~159f to main therapy gym with L HHA and CGA/min assist for balance due to increased postural sway and for safety as pt easily distracted by items in environment resulting in sudden and impulsive turning - once in gym pt notices dumbbell weights and insists on grabbing one and spontaneously performs standing bicep curls using 6# weight ~5 reps per UE despite pt demonstrating increased labored breathing requiring max cuing/gentle redirection to sit on EOM and rest. SpO2 96% and HR 136bpm recovering to 111bpm with seated rest although pt becomes restless while sitting with therapist attempts at engaging him in conversations to allow longer seated break for HR recovery. Ascended/descended 4 steps using B HRs with pt self-selecting step-to versus reciprocal pattern with min assist for balance. Again encouraged seated rest break due to labored breathing with HR elevated to 144bpm recovering to 135bpm and then 112bpm after ~3 minutes - continues to require distraction to  remain sitting on mat due to restlessness. Gait training [facility]18[facility]min assist as described above. With redirection pt able to perform sit>supine in bed with supervision. Pt left supine in bed with needs in reach, bed alarm on, and telesitter in place. Made up 32 of the earlier missed minutes.  Mobility Bed Mobility Bed Mobility: Supine to Sit;Sit to Supine Supine  to Sit: Contact Guard/Touching assist Sit to Supine: Contact Guard/Touching assist Transfers Transfers: Sit to Stand;Stand to Sit;Stand Pivot Transfers Sit to Stand: Contact Guard/Touching assist Stand to Sit: Contact Guard/Touching assist Stand Pivot Transfers: Minimal Assistance - Patient > 75% Stand Pivot Transfer Details: Verbal cues for sequencing;Verbal cues for technique;Verbal cues for precautions/safety;Tactile cues for sequencing;Tactile cues for weight shifting Transfer (Assistive device): 1 person hand held assist Locomotion  Gait Ambulation: Yes Gait Assistance: Minimal Assistance - Patient > 75% Gait Distance (Feet): 180 Feet Assistive device: 1 person hand held assist Gait Assistance Details: Verbal cues for precautions/safety;Verbal cues for gait pattern;Tactile cues for sequencing;Tactile cues for weight shifting Gait Gait: Yes Gait Pattern: Impaired Gait Pattern: Wide base of support;Decreased step length - left;Decreased step length - right;Decreased stride length Stairs / Additional Locomotion Stairs: Yes Stairs Assistance: Minimal Assistance - Patient > 75% Stair Management Technique: Two rails Number of Stairs: 4 Height of Stairs: 6 Wheelchair Mobility Wheelchair Mobility: No   Discharge Criteria: Patient will be discharged from PT if patient refuses treatment 3 consecutive times without medical reason, if treatment goals not met, if there is a change in medical status, if patient makes no progress towards goals or if patient is discharged from hospital.  The above  assessment, treatment plan, treatment alternatives and goals were discussed and mutually agreed upon: by patient and by family  Tawana Scale , PT, DPT, CSRS  08/05/2020, 7:55 AM

## 2020-08-05 NOTE — Progress Notes (Signed)
Patient did not sleep all night. PRN medications administered.

## 2020-08-05 NOTE — Progress Notes (Signed)
Pt continues to act against safety precautions. Pt continues to self transfer, ignoring reinforcement. Chair alarm placed.  Mylo Red, LPN

## 2020-08-05 NOTE — Evaluation (Signed)
Speech Language Pathology Assessment and Plan  Patient Details  Name: Nicholas Valdez MRN: [id number] Date of Birth: [birth year]  SLP Diagnosis: Cognitive Impairments;Dysphagia  Rehab Potential: Good ELOS: 2-3 weeks    Today's Date: 08/05/2020 SLP Individual Time: 1300-1400 SLP Individual Time Calculation (min): 60 min   Hospital Problem: Principal Problem:   Acute metabolic encephalopathy Active Problems:   Peripheral edema   Impulsive  Past Medical History:  Past Medical History:  Diagnosis Date  . Alcohol abuse   . Anxiety   . Depression   . Drug abuse ([place])   . GERD (gastroesophageal reflux disease)   . Hepatitis C   . Hypertension   . Pneumonia   . Pre-diabetes   . Sleep apnea    Past Surgical History:  Past Surgical History:  Procedure Laterality Date  . GASTROSTOMY W/ FEEDING TUBE Left 07/22/2020  . TRACHEOSTOMY TUBE PLACEMENT N/A 07/10/2020   Procedure: TRACHEOSTOMY;  Surgeon: Carloyn Manner, MD;  Location: [facility] ORS;  Service: ENT;  Laterality: N/A;    Assessment / Plan / Recommendation Clinical Impression Patient is a 40 y.o. year old right-handed male with history significant for obesity with BMI 44.98, hepatitis C as well as alcohol/polysubstance /tobacco abuse. History taken from chart review and patient. Patient lives with parent and 60 year old daughter. Independent prior to admission. 1 level home 2 steps to entry. Independent at baseline. He presented on 06/19/2020 to [facility] for inpatient detox but was found to be confused with nausea and was sent to the emergency department. Patient was seen in the emergency department approximately 2 weeks prior for alcohol withdrawal and started on Librium protocol. Cranial CT unremarkable for acute intracranial process. Admission chemistries glucose 137 calcium 7.8, hemoglobin 13.3, alcohol 15, ammonia 76, lactic acid 3.3, CK 138. He was emergently intubated for airway protection. Patient with prolonged ICU  course continued to have alcohol withdrawal agitation, requiring Precedex drip. Required tracheostomy on 07/10/2020 per Dr. Carloyn Manner with #8 cuffed Shiley placed as well as placement of gastrostomy tube for nutritional support on 07/22/2020 per Dr. Caroleen Hamman. Tracheal aspirate Elizabethkingia Meningoseptica and placed on broad-spectrum antibiotics which were completed on 08/01/2020. There is no current plan to downsize this patient remained with #8 Shiley trach as well as speech therapy working with Passy-Muir valve. Patient has required sitters for agitation and restlessness and remains on Seroquel as well as Zyprexa/valproic acid. He is tolerating a mechanical soft thin liquid diet. Maintained on Lovenox for DVT prophylaxis. Hospital course: Complicated by acute on chronic anemia with latest hemoglobin of 9.9 and chemistries unremarkable as of 08/02/2020. Due to decreased functional mobility and necessity for assistance with ADLs, patient was admitted due to patient decreasing functional mobility need for assistance for ADLs he was admitted for a comprehensive rehabilitation program.  Patient transferred to CIR on 08/04/2020.  Pt presents with moderately-severe cognitive impairment at evaluation, unsure if this is patient's true performance due to severe variations in alertness, attention and participation at this time. Pt would fluctuate between speaking in sentences to falling asleep and snoring within seconds. Most speech this date was nonsensical and paranoid ("they are going to put me in a microwave" and stating that his medications were full of poison).  During moments of lucidity, pt able to recall rationale behind hospital admission but unable to state correct DOW, MOY, year, address, birthday or how long he has been hospitalized. At times, patient was able to make reasonable requests such as asking for help with a phone call,  other times patient extremely impulsive and unable to answer  simple questions related to care. Pt will benefit from ongoing assessment of cognitive linguistic function to determine new baseline and current impairments.   Pt presents with mild dysphagia, mostly notable oral phase and likely exacerbated by decreased alertness/attention during eval. Pt tolerating Dys 3 and Dys 4 trials with no overt s/s aspiration, more prolonged mastication with Dys 4 and increased oral stasis after initial swallow. Pt demonstrating decreased rotary chew pattern and primarily chewing in anterior portion of mouth before transit. Thin liquids via cup with functional oral and pharyngeal phase, thin via straw with mild intermittent throat clear/cough. Recommend continue current diet of Dys 3/thin no straw, intermittent supervision and standard swallow precautions. Pt will benefit from reduced distractions during meals.    Skilled Therapeutic Interventions          Pt participating in Bedside Swallow Evaluation and cognitive linguistic assessment (portions of SLUMS - pt unable to participate in formal assessment at this time 2' decreased alertness and attention).  SLP Assessment  Patient will need skilled Speech Lanaguage Pathology Services during CIR admission    Recommendations  SLP Diet Recommendations: Dysphagia 3 (Mech soft);Thin Liquid Administration via: Cup;No straw Medication Administration: Whole meds with puree Supervision: Patient able to self feed;Intermittent supervision to cue for compensatory strategies Compensations: Minimize environmental distractions;Slow rate;Small sips/bites;Lingual sweep for clearance of pocketing;Follow solids with liquid Postural Changes and/or Swallow Maneuvers: Seated upright 90 degrees;Upright 30-60 min after meal Oral Care Recommendations: Oral care BID;Oral care before and after PO;Staff/trained caregiver to provide oral care Recommendations for Other Services: Neuropsych consult Patient destination: Home Follow up Recommendations: Home  Health SLP Equipment Recommended: To be determined    SLP Frequency 3 to 5 out of 7 days   SLP Duration  SLP Intensity  SLP Treatment/Interventions 2-3 weeks  Minumum of 1-2 x/day, 30 to 90 minutes  Cognitive remediation/compensation;Cueing hierarchy;Dysphagia/aspiration precaution training;Medication managment;Patient/family education;Therapeutic Exercise;Therapeutic Activities;Functional tasks    Pain Pain Assessment Pain Scale: 0-10 Pain Score: 0-No pain  Prior Functioning Cognitive/Linguistic Baseline: Within functional limits Type of Home: House  Lives With: Family Available Help at Discharge: Family;Available 24 hours/day  SLP Evaluation Cognition Overall Cognitive Status: No family/caregiver present to determine baseline cognitive functioning Arousal/Alertness: Lethargic Orientation Level: Oriented to person;Oriented to situation Attention: Sustained;Focused;Divided Focused Attention: Impaired Sustained Attention: Impaired Divided Attention: Impaired Memory: Impaired Memory Impairment: Decreased short term memory;Decreased recall of new information Awareness: Impaired Problem Solving: Impaired Behaviors: Impulsive;Restless;Poor frustration tolerance;Perseveration;Verbal agitation Safety/Judgment: Impaired [employer] Scales of Cognitive Functioning: Confused/inappropriate/non-agitated  Comprehension Auditory Comprehension Overall Auditory Comprehension: Impaired Yes/No Questions: Impaired Basic Immediate Environment Questions: 50-74% accurate Complex Questions: 25-49% accurate Commands: Impaired Multistep Basic Commands: 25-49% accurate Conversation: Simple Interfering Components: Attention EffectiveTechniques: Extra processing time;Increased volume;Repetition;Slowed speech Visual Recognition/Discrimination Discrimination: Not tested Reading Comprehension Reading Status: Not tested Expression Expression Primary Mode of Expression: Verbal Verbal  Expression Overall Verbal Expression: Appears within functional limits for tasks assessed Automatic Speech: Name Level of Generative/Spontaneous Verbalization: Word Repetition: No impairment Naming: No impairment Written Expression Dominant Hand: Right Written Expression: Not tested Oral Motor Oral Motor/Sensory Function Overall Oral Motor/Sensory Function: Within functional limits Motor Speech Intelligibility: Intelligible  Care Tool Care Tool Cognition Expression of Ideas and Wants Expression of Ideas and Wants: Some difficulty - exhibits some difficulty with expressing needs and ideas (e.g, some words or finishing thoughts) or speech is not clear   Understanding Verbal and Non-Verbal Content Understanding Verbal and Non-Verbal Content: Sometimes understands -  understands only basic conversations or simple, direct phrases. Frequently requires cues to understand   Memory/Recall Ability *first 3 days only Memory/Recall Ability *first 3 days only: That he or she is in a hospital/hospital unit    Bedside Swallowing Assessment General Date of Onset: 06/20/20 Previous Swallow Assessment: BSE at previous hospital Diet Prior to this Study: Dysphagia 3 (soft);Thin liquids Temperature Spikes Noted: No Respiratory Status: Room air Trach Size and Type: Cuff;#8 - capped Behavior/Cognition: Alert;Confused;Agitated;Impulsive;Lethargic/Drowsy;Distractible;Requires cueing;Fusing/Irritable Oral Cavity - Dentition: Missing dentition;Poor condition Self-Feeding Abilities: Able to feed self Vision: Functional for self-feeding Patient Positioning: Upright in bed Baseline Vocal Quality: Hoarse Volitional Cough: Strong Volitional Swallow: Able to elicit  Oral Care Assessment Does patient have any of the following "high(er) risk" factors?: Tracheostomy with trach collar 24 hrs./day Does patient have any of the following "at risk" factors?: Lips - dry, cracked Ice Chips Ice chips: Within  functional limits Thin Liquid Thin Liquid: Within functional limits Presentation: Cup;Self Fed;Straw Other Comments: mild intermitten throat clear with thin via straw, cont to recommend cup sip Nectar Thick Nectar Thick Liquid: Not tested Honey Thick Honey Thick Liquid: Not tested Puree Puree: Not tested Solid Solid: Within functional limits Other Comments: prolonged mastication of Dys 3 and Dys 4 solids d/t missing dentition, decreased rotary chew pattern, min stasis after swallow likely 2' decreased alertness BSE Assessment Risk for Aspiration Impact on safety and function: Mild aspiration risk;Risk for inadequate nutrition/hydration Other Related Risk Factors: Tracheostomy;Deconditioning;Cognitive impairment  Short Term Goals: Week 1: SLP Short Term Goal 1 (Week 1): Pt will increase sustained attention to 5 minutes with mod A SLP Short Term Goal 2 (Week 1): Pt will increase simple problem solving for current environment with mod A SLP Short Term Goal 3 (Week 1): Pt will increase functional recall with use of compensatory memory strategies and visual aids with mod A SLP Short Term Goal 4 (Week 1): Pt will consume trials regular solids and thin liquid via straw with min A for use of compensatory strategies  Refer to Care Plan for Long Term Goals  Recommendations for other services: Neuropsych  Discharge Criteria: Patient will be discharged from SLP if patient refuses treatment 3 consecutive times without medical reason, if treatment goals not met, if there is a change in medical status, if patient makes no progress towards goals or if patient is discharged from hospital.  The above assessment, treatment plan, treatment alternatives and goals were discussed and mutually agreed upon: by patient  Dewaine Conger 08/05/2020, 1:59 PM

## 2020-08-05 NOTE — Progress Notes (Signed)
MD Allena Katz aware of pt not keeping on continuous pulse ox. MD Allena Katz advises to keep it off and intermittently monitor o2. Also made aware that pt pulled out IV. Mylo Red, LPN

## 2020-08-05 NOTE — Progress Notes (Signed)
MD Allena Katz updated on pt increased delirium. Pt having conversations with self, incomprehensible language and sentences. New orders received.  Mylo Red, LPN

## 2020-08-05 NOTE — Evaluation (Addendum)
Occupational Therapy Assessment and Plan  Patient Details  Name: Nicholas Valdez MRN: [id number] Date of Birth: Jan 26, 1981  OT Diagnosis: abnormal posture, cognitive deficits, muscle weakness (generalized) and swelling of limb Rehab Potential: Rehab Potential (ACUTE ONLY): Good ELOS: 3-5 days   Today's Date: 08/05/2020 OT Individual Time: 0900-1000 OT Individual Time Calculation (min): 60 min     Hospital Problem: Principal Problem:   Acute metabolic encephalopathy   Past Medical History:  Past Medical History:  Diagnosis Date  . Alcohol abuse   . Anxiety   . Depression   . Drug abuse ([place])   . GERD (gastroesophageal reflux disease)   . Hepatitis C   . Hypertension   . Pneumonia   . Pre-diabetes   . Sleep apnea    Past Surgical History:  Past Surgical History:  Procedure Laterality Date  . GASTROSTOMY W/ FEEDING TUBE Left 07/22/2020  . TRACHEOSTOMY TUBE PLACEMENT N/A 07/10/2020   Procedure: TRACHEOSTOMY;  Surgeon: Carloyn Manner, MD;  Location: [facility] ORS;  Service: ENT;  Laterality: N/A;    Assessment & Plan Clinical Impression: Nicholas Valdez is a 40 year old right-handed male with history significant for obesity with BMI 44.98, hepatitis C as well as alcohol/polysubstance /tobacco abuse.  History taken from chart review and patient.  Patient lives with parent and 44 year old daughter.  Independent prior to admission.  1 level home 2 steps to entry.  Independent at baseline.  He presented on 06/19/2020 to [facility] for inpatient detox but was found to be confused with nausea and was sent to the emergency department.  Patient was seen in the emergency department approximately 2 weeks prior for alcohol withdrawal and started on Librium protocol.  Cranial CT unremarkable for acute intracranial process.  Admission chemistries glucose 137 calcium 7.8, hemoglobin 13.3, alcohol 15, ammonia 76, lactic acid 3.3, CK 138.  He was emergently intubated for airway protection.  Patient  with prolonged ICU course continued to have alcohol withdrawal agitation, requiring Precedex drip.  Required tracheostomy on 07/10/2020 per Dr. Carloyn Manner with #8 cuffed Shiley placed as well as placement of gastrostomy tube for nutritional support on 07/22/2020 per Dr. Caroleen Hamman.  Tracheal aspirate Elizabethkingia Meningoseptica and placed on broad-spectrum antibiotics which were completed on 08/01/2020. There is no current plan to downsize this patient remained with #8 Shiley trach as well as speech therapy working with Passy-Muir valve.  Patient has required sitters for agitation and restlessness and remains on Seroquel as well as Zyprexa/valproic acid.  He is tolerating a mechanical soft thin liquid diet.  Maintained on Lovenox for DVT prophylaxis.  Hospital course: Complicated by acute on chronic anemia with latest hemoglobin of 9.9 and chemistries unremarkable as of 08/02/2020.  Due to decreased functional mobility and necessity for assistance with ADLs, patient was admitted due to patient decreasing functional mobility need for assistance for ADLs he was admitted for a comprehensive rehabilitation program.  Please see preadmission assessment from earlier today as well.  Patient currently requires min with basic self-care skills secondary to muscle weakness, decreased cardiorespiratoy endurance, decreased attention, decreased awareness, decreased problem solving, decreased safety awareness and decreased memory and decreased standing balance and decreased postural control.  Prior to hospitalization, patient could complete BADLs with independent .  Patient will benefit from skilled intervention to increase independence with basic self-care skills prior to discharge home with family support.  Anticipate patient will require 24 hour supervision and follow up home health.  OT - End of Session Endurance Deficit: Yes Endurance Deficit Description:  Pt needing a few seated rest breaks due to some  SOB/onset of wheezing, 02 sats >90% when assessed OT Assessment Rehab Potential (ACUTE ONLY): Good OT Barriers to Discharge: Trach;Weight;Behavior OT Barriers to Discharge Comments: Impulsivity + decreased safety awareness OT Patient demonstrates impairments in the following area(s): Balance;Safety;Behavior;Cognition;Edema;Skin Integrity;Endurance;Motor;Nutrition OT Basic ADL's Functional Problem(s): Grooming;Bathing;Dressing;Toileting OT Advanced ADL's Functional Problem(s): Simple Meal Preparation OT Transfers Functional Problem(s): Toilet;Tub/Shower OT Additional Impairment(s): None OT Plan OT Intensity: Minimum of 1-2 x/day, 45 to 90 minutes OT Frequency: 5 out of 7 days OT Duration/Estimated Length of Stay: 3-5 days OT Treatment/Interventions: Discharge planning;Balance/vestibular training;Pain management;Self Care/advanced ADL retraining;Therapeutic Activities;UE/LE Coordination activities;Therapeutic Exercise;Patient/family education;Functional mobility training;Disease mangement/prevention;Cognitive remediation/compensation;Community reintegration;DME/adaptive equipment instruction;Psychosocial support;UE/LE Strength taining/ROM OT Self Feeding Anticipated Outcome(s): No goal OT Basic Self-Care Anticipated Outcome(s): Supervision OT Toileting Anticipated Outcome(s): Supervision OT Bathroom Transfers Anticipated Outcome(s): Supervision OT Recommendation Recommendations for Other Services: Neuropsych consult Patient destination: Home Follow Up Recommendations: 24 hour supervision/assistance;Home health OT Equipment Recommended: To be determined   OT Evaluation Precautions/Restrictions  Precautions Precautions: Fall Precaution Comments: capped trach, PEG, decreased safety awareness and impulsivity General   Vital Signs Therapy Vitals Temp: 98.4 F (36.9 C) Temp Source: Oral Pulse Rate: (!) 113 Resp: 19 BP: 111/84 Patient Position (if appropriate): Sitting Oxygen  Therapy SpO2: 98 % O2 Device: Room Air FiO2 (%): 21 % Pain Pain Assessment Pain Scale: 0-10 Pain Score: 6  Pain Location: Throat Pain Intervention(s): Medication (See eMAR) Home Living/Prior Functioning Home Living Available Help at Discharge: Family,Available 24 hours/day Type of Home: House Home Access: Stairs to enter [employer] of Steps: 2-3 Home Layout: One level Bathroom Shower/Tub: Optometrist: Yes  Lives With: Family (mother and 44 y/o daughter) IADL History Homemaking Responsibilities: No (pt reported cooking sometimes PTA but mother took care of other IADLs due to pts report of depression/substance abuse) Occupation: Unemployed Leisure and Hobbies: likes to play the guitar Prior Function Level of Independence: Independent with basic ADLs,Independent with homemaking with ambulation Driving: No Vision Baseline Vision/History: Wears glasses Wears Glasses: At all times Patient Visual Report: No change from baseline Perception  Perception: Within Functional Limits Praxis Praxis: Impaired Praxis Impairment Details: Motor planning;Perseveration Cognition Arousal/Alertness: Awake/alert Orientation Level: Person;Place;Situation Person: Oriented Place: Oriented Situation: Oriented Year: 2022 Month: January Day of Week: Incorrect Memory: Impaired Memory Impairment: Decreased short term memory;Decreased recall of new information Immediate Memory Recall: Sock;Blue;Bed Memory Recall Sock: Without Cue Memory Recall Blue: Without Cue Memory Recall Bed: With Cue Attention: Sustained;Focused;Divided Focused Attention: Impaired Sustained Attention: Impaired Divided Attention: Impaired Awareness: Impaired Problem Solving: Impaired Behaviors: Impulsive;Restless;Poor frustration tolerance;Perseveration Safety/Judgment: Impaired Astronomer Motor Movements are Fluid and Coordinated:  No Fine Motor Movements are Fluid and Coordinated: No Coordination and Movement Description: Gross motor movements affected by body habitus, some incoordination noted in the Lt UE with fine motor tasks Finger Nose Finger Test: Dysmetria Lt, WNL Rt Motor  Motor Motor: Other (comment) Motor - Skilled Clinical Observations: Affected by generalized weakness and LE edema  Trunk/Postural Assessment  Cervical Assessment Cervical Assessment: Exceptions to [facility] (forward head) Thoracic Assessment Thoracic Assessment: Exceptions to [facility] (rounded shoulders) Lumbar Assessment Lumbar Assessment: Exceptions to [facility] (posterior pelvic tilt) Postural Control Postural Control: Deficits on evaluation (limited in standing)  Balance Balance Balance Assessed: Yes Dynamic Sitting Balance Dynamic Sitting - Balance Support: Feet supported;During functional activity;No upper extremity supported (donning footwear) Dynamic Sitting - Level of Assistance: 5: Stand by assistance Dynamic Standing Balance Dynamic Standing -  Balance Support: During functional activity;No upper extremity supported Dynamic Standing - Level of Assistance: 4: Min assist Dynamic Standing - Balance Activities: Lateral lean/weight shifting;Forward lean/weight shifting (toilet transfer) Extremity/Trunk Assessment RUE Assessment RUE Assessment: Within Functional Limits Active Range of Motion (AROM) Comments: WNL General Strength Comments: 4-/5 grossly LUE Assessment LUE Assessment: Within Functional Limits Active Range of Motion (AROM) Comments: WNL General Strength Comments: 4-/5 grossly  Care Tool Care Tool Self Care Eating   Eating Assist Level: Independent    Oral Care    Oral Care Assist Level: Contact Guard/Toucning assist (standing)    Bathing   Body parts bathed by patient: Right arm;Left arm;Chest;Abdomen;Front perineal area;Buttocks;Right upper leg;Left upper leg;Right lower leg;Left lower leg;Face     Assist Level:  Contact Guard/Touching assist    Upper Body Dressing(including orthotics)   What is the patient wearing?: Pull over shirt   Assist Level: Contact Guard/Touching assist (standing)    Lower Body Dressing (excluding footwear)   What is the patient wearing?: Pants Assist for lower body dressing: Contact Guard/Touching assist    Putting on/Taking off footwear   What is the patient wearing?: Non-skid slipper socks Assist for footwear: Supervision/Verbal cueing       Care Tool Toileting Toileting activity   Assist for toileting: Contact Guard/Touching assist     Care Tool Bed Mobility Roll left and right activity   Roll left and right assist level: Independent    Sit to lying activity   Sit to lying assist level: Independent    Lying to sitting edge of bed activity   Lying to sitting edge of bed assist level: Independent     Care Tool Transfers Sit to stand transfer   Sit to stand assist level: Independent        Toilet transfer   Assist Level: Contact Guard/Touching assist       Refer to Care Plan for Long Term Goals  SHORT TERM GOAL WEEK 1 OT Short Term Goal 1 (Week 1): STGs=LTGs due to ELOS  Recommendations for other services: Neuropsych   Skilled Therapeutic Intervention Skilled OT session completed with focus on initial evaluation, education on OT role/POC, and establishment of patient-centered goals.   Pt greeted while sitting EOB wearing his shirt inside out, RN present. Pt requesting to stand up and did so with CGA, initiating "dancing" with therapist and appearing cheerful. Pt then c/o tightness of fit of trach, RN notified to call respiratory to address. Pt was set up to complete bathing/dressing tasks sit<stand at sink, but would lose focus and ambulate around room/bathroom periodically to do something completely different (I.e. look for shoes, pick up his lottery card or call his father on the phone). CGA-close supervision for ambulation without AD. He  required cues for sustained attention to task, did well with following verbal instruction from therapist but exhibited very poor safety awareness and general awareness, I.e. spilling half a basin of water on the floor and needing cues to acknowledge before attempting to ambulate. Put his pants on backwards, put 2 gripper socks on each foot and needed cues to realize these things. Note that he is also hyperverbal. Due to some wheezing, 02 assessed during session with readings no lower than 96% on RA. Pt left in care of NT and RN at end of session to assess vitals.   Note that pt refused to wear Teds due to discomfort He also hugged therapist x2 but it appeared more of a friendly gesture vs inappropriate sexual behavior  ADL ADL Eating: Not assessed Grooming: Contact guard Where Assessed-Grooming: Standing at sink Upper Body Bathing: Supervision/safety Where Assessed-Upper Body Bathing: Sitting at sink Lower Body Bathing: Contact guard Where Assessed-Lower Body Bathing: Standing at sink;Sitting at sink;Edge of bed Upper Body Dressing: Contact guard Where Assessed-Upper Body Dressing: Standing at sink Lower Body Dressing: Contact guard Where Assessed-Lower Body Dressing: Edge of bed;Sitting at sink;Standing at sink Toileting: Not assessed Toilet Transfer: Therapist, music Method: Ambulating (without AD) Science writer: Raised toilet seat Social research officer, government: Curator Method: Heritage manager: Radio broadcast assistant ADL Comments: All ambulatory transfers completed without AD   Discharge Criteria: Patient will be discharged from OT if patient refuses treatment 3 consecutive times without medical reason, if treatment goals not met, if there is a change in medical status, if patient makes no progress towards goals or if patient is discharged from hospital.  The above assessment, treatment plan, treatment alternatives and  goals were discussed and mutually agreed upon: by patient  Skeet Simmer 08/05/2020, 9:57 AM

## 2020-08-05 NOTE — Progress Notes (Signed)
   08/05/20 0316  Therapy Vitals  Pulse Rate (!) 102  Resp 18  Patient Position (if appropriate) Lying  MEWS Score/Color  MEWS Score 1  MEWS Score Color Green  Respiratory Assessment  Assessment Type Assess only  Respiratory Pattern Regular;Unlabored  Chest Assessment Chest expansion symmetrical  Cough Congested  Sputum Amount Copious  Sputum Color Clear  Sputum Consistency Thick  Sputum How Obtained Other (Comment)  Bilateral Breath Sounds Coarse crackles  R Upper  Breath Sounds Coarse crackles  L Upper Breath Sounds Coarse crackles  R Lower Breath Sounds Coarse crackles  L Lower Breath Sounds Coarse crackles  Oxygen Therapy/Pulse Ox  O2 Device Room Air  O2 Therapy Room air  FiO2 (%) 21 %  SpO2 100 %  Tracheostomy  8 mm Distal;Cuffed  Placement Date/Time: 07/10/20 1218   Placed By: (c) Other (Comment)  Brand: (c)   Size (mm): 8 mm  Style: Distal;Cuffed  Serial / Lot #: [id number]  Expiration Date: 12/06/24  Status Capped  Site Assessment Clean;Dry  Site Care Dried;Cleansed  Inner Cannula Care Cleansed/dried  Ties Assessment Dry;Clean  Cuff pressure (cm) 0 cm  Tracheostomy Equipment at bedside Yes and checklist posted at head of bed  Respiratory  Airway LDA Tracheostomy  Pt. States that outside of [facility] hospital pt. Is capped and doesn't not require oxygen, pt. Wants to continue to be capped while in [place].

## 2020-08-05 NOTE — Progress Notes (Signed)
Pt  Noncompliant with safety precautions. Pt does not redirect. Pt able to release and put down the SR's. Dr. Allena Katz called and orders obtained.

## 2020-08-05 NOTE — Progress Notes (Signed)
Pt trach suctioned. Pt tolerated well. Oxygen sats stable. 99% room air.  Mylo Red, LPN

## 2020-08-05 NOTE — Plan of Care (Signed)
  Problem: RH Balance Goal: LTG Patient will maintain dynamic standing with ADLs (OT) Description: LTG:  Patient will maintain dynamic standing balance with assist during activities of daily living (OT)  Flowsheets (Taken 08/05/2020 1535) LTG: Pt will maintain dynamic standing balance during ADLs with: Supervision/Verbal cueing   Problem: Sit to Stand Goal: LTG:  Patient will perform sit to stand in prep for activites of daily living with assistance level (OT) Description: LTG:  Patient will perform sit to stand in prep for activites of daily living with assistance level (OT) Flowsheets (Taken 08/05/2020 1535) LTG: PT will perform sit to stand in prep for activites of daily living with assistance level: Supervision/Verbal cueing   Problem: RH Grooming Goal: LTG Patient will perform grooming w/assist,cues/equip (OT) Description: LTG: Patient will perform grooming with assist, with/without cues using equipment (OT) Flowsheets (Taken 08/05/2020 1535) LTG: Pt will perform grooming with assistance level of: Supervision/Verbal cueing   Problem: RH Bathing Goal: LTG Patient will bathe all body parts with assist levels (OT) Description: LTG: Patient will bathe all body parts with assist levels (OT) Flowsheets (Taken 08/05/2020 1535) LTG: Pt will perform bathing with assistance level/cueing: Supervision/Verbal cueing   Problem: RH Dressing Goal: LTG Patient will perform upper body dressing (OT) Description: LTG Patient will perform upper body dressing with assist, with/without cues (OT). Flowsheets (Taken 08/05/2020 1535) LTG: Pt will perform upper body dressing with assistance level of: Supervision/Verbal cueing Goal: LTG Patient will perform lower body dressing w/assist (OT) Description: LTG: Patient will perform lower body dressing with assist, with/without cues in positioning using equipment (OT) Flowsheets (Taken 08/05/2020 1535) LTG: Pt will perform lower body dressing with assistance level of:  Supervision/Verbal cueing   Problem: RH Toileting Goal: LTG Patient will perform toileting task (3/3 steps) with assistance level (OT) Description: LTG: Patient will perform toileting task (3/3 steps) with assistance level (OT)  Flowsheets (Taken 08/05/2020 1535) LTG: Pt will perform toileting task (3/3 steps) with assistance level: Supervision/Verbal cueing   Problem: RH Toilet Transfers Goal: LTG Patient will perform toilet transfers w/assist (OT) Description: LTG: Patient will perform toilet transfers with assist, with/without cues using equipment (OT) Flowsheets (Taken 08/05/2020 1535) LTG: Pt will perform toilet transfers with assistance level of: Supervision/Verbal cueing   Problem: RH Tub/Shower Transfers Goal: LTG Patient will perform tub/shower transfers w/assist (OT) Description: LTG: Patient will perform tub/shower transfers with assist, with/without cues using equipment (OT) Flowsheets (Taken 08/05/2020 1535) LTG: Pt will perform tub/shower stall transfers with assistance level of: Supervision/Verbal cueing

## 2020-08-05 NOTE — Progress Notes (Signed)
   08/04/20 1842  Assess: MEWS Score  Temp 98 F (36.7 C)  BP 128/83  Pulse Rate (!) 114  Resp 18  Level of Consciousness Alert  SpO2 98 %  O2 Device Room Air  Assess: MEWS Score  MEWS Temp 0  MEWS Systolic 0  MEWS Pulse 2  MEWS RR 0  MEWS LOC 0  MEWS Score 2  MEWS Score Color Yellow  Assess: if the MEWS score is Yellow or Red  Were vital signs taken at a resting state? Yes  Focused Assessment Change from prior assessment (see assessment flowsheet)  Early Detection of Sepsis Score *See Row Information* Low  MEWS guidelines implemented *See Row Information* Yes  Treat  MEWS Interventions Escalated (See documentation below)  Take Vital Signs  Increase Vital Sign Frequency  Yellow: Q 2hr X 2 then Q 4hr X 2, if remains yellow, continue Q 4hrs  Escalate  MEWS: Escalate Yellow: discuss with charge nurse/RN and consider discussing with provider and RRT  Notify: Charge Nurse/RN  Name of Charge Nurse/RN Notified Gavin Pound RN  Date Charge Nurse/RN Notified 08/04/20  Time Charge Nurse/RN Notified 1859  Notify: Provider  Provider Name/Title MD Allena Katz  Date Provider Notified 08/04/20  Time Provider Notified 1900  Notification Type Call  Notification Reason Change in status  Response No new orders  Date of Provider Response 08/04/20  Time of Provider Response 1902

## 2020-08-06 DIAGNOSIS — R4587 Impulsiveness: Secondary | ICD-10-CM | POA: Diagnosis not present

## 2020-08-06 DIAGNOSIS — G479 Sleep disorder, unspecified: Secondary | ICD-10-CM | POA: Diagnosis not present

## 2020-08-06 DIAGNOSIS — G9341 Metabolic encephalopathy: Secondary | ICD-10-CM | POA: Diagnosis not present

## 2020-08-06 DIAGNOSIS — R609 Edema, unspecified: Secondary | ICD-10-CM | POA: Diagnosis not present

## 2020-08-06 LAB — GLUCOSE, CAPILLARY
Glucose-Capillary: 123 mg/dL — ABNORMAL HIGH (ref 70–99)
Glucose-Capillary: 141 mg/dL — ABNORMAL HIGH (ref 70–99)

## 2020-08-06 MED ORDER — DIVALPROEX SODIUM 500 MG PO DR TAB
500.0000 mg | DELAYED_RELEASE_TABLET | Freq: Two times a day (BID) | ORAL | Status: DC
  Administered 2020-08-06 – 2020-08-11 (×10): 500 mg via ORAL
  Filled 2020-08-06 (×11): qty 1

## 2020-08-06 MED ORDER — PANTOPRAZOLE SODIUM 40 MG PO TBEC
40.0000 mg | DELAYED_RELEASE_TABLET | Freq: Every day | ORAL | Status: DC
  Administered 2020-08-07 – 2020-08-11 (×5): 40 mg via ORAL
  Filled 2020-08-06 (×4): qty 1
  Filled 2020-08-06: qty 2

## 2020-08-06 MED ORDER — PROSOURCE PLUS PO LIQD
30.0000 mL | Freq: Three times a day (TID) | ORAL | Status: DC
  Administered 2020-08-06 – 2020-08-11 (×13): 30 mL via ORAL
  Filled 2020-08-06 (×14): qty 30

## 2020-08-06 NOTE — Progress Notes (Addendum)
[employer] PHYSICAL MEDICINE & REHABILITATION PROGRESS NOTE  Subjective/Complaints: Patient seen laying in canopy bed this morning.  He states he slept well overnight.  Overnight patient noted to be noncompliant with safety precautions, removing guardrails and attempting to ambulate.  Canopy bed ordered.  Patient perseverative on needing ice cream this morning.  Sleep improved overnight.  ROS: Denies CP, SOB, N/V/D  Objective: Vital Signs: Blood pressure 117/84, pulse 94, temperature 99.4 F (37.4 C), temperature source Oral, resp. rate 16, height 5\' 10"  (1.778 m), weight (!) 142.2 kg, SpO2 98 %. No results found. Recent Labs    08/05/20 0651  WBC 7.5  HGB 9.4*  HCT 29.5*  PLT 321   Recent Labs    08/04/20 0345 08/05/20 0651  NA 136  --   K 3.8  --   CL 99  --   CO2 26  --   GLUCOSE 139*  --   BUN 7  --   CREATININE 0.82 0.75  CALCIUM 8.8*  --     Intake/Output Summary (Last 24 hours) at 08/06/2020 1624 Last data filed at 08/06/2020 1237 Gross per 24 hour  Intake 1720 ml  Output --  Net 1720 ml     Pressure Injury 07/26/20 Heel Left;Lateral Deep Tissue Pressure Injury - Purple or maroon localized area of discolored intact skin or blood-filled blister due to damage of underlying soft tissue from pressure and/or shear. purple and pink blistered area  (Active)  07/26/20 0730  Location: Heel  Location Orientation: Left;Lateral  Staging: Deep Tissue Pressure Injury - Purple or maroon localized area of discolored intact skin or blood-filled blister due to damage of underlying soft tissue from pressure and/or shear.  Wound Description (Comments): purple and pink blistered area on L  lateral heel  Present on Admission: -- (possible mistake on chart, maybe prior R heel documented by mistake)    Physical Exam: BP 117/84 (BP Location: Left Arm)   Pulse 94   Temp 99.4 F (37.4 C) (Oral)   Resp 16   Ht 5\' 10"  (1.778 m)   Wt (!) 142.2 kg   SpO2 98%   BMI 44.98 kg/m    Constitutional: No distress . Vital signs reviewed.  Morbidly obese. HENT: Normocephalic.  Atraumatic. Eyes: EOMI. No discharge. Cardiovascular: No JVD.  Tachycardia. Respiratory: Normal effort.  No stridor.  Bilateral clear to auscultation. GI: Non-distended.  BS +. Skin: Warm and dry.  Intact. Psych: Verbose.  Perseverative. Musc: Lower extremity edema, stable.  No tenderness in extremities. Motor: 4-4+/5 throughout  Assessment/Plan: 1. Functional deficits which require 3+ hours per day of interdisciplinary therapy in a comprehensive inpatient rehab setting.  Physiatrist is providing close team supervision and 24 hour management of active medical problems listed below.  Physiatrist and rehab team continue to assess barriers to discharge/monitor patient progress toward functional and medical goals   Care Tool:  Bathing    Body parts bathed by patient: Right arm,Left arm,Chest,Abdomen,Front perineal area,Buttocks,Right upper leg,Left upper leg,Right lower leg,Left lower leg,Face         Bathing assist Assist Level: Contact Guard/Touching assist     Upper Body Dressing/Undressing Upper body dressing   What is the patient wearing?: Pull over shirt    Upper body assist Assist Level: Contact Guard/Touching assist (standing)    Lower Body Dressing/Undressing Lower body dressing      What is the patient wearing?: Pants     Lower body assist Assist for lower body dressing: Contact Guard/Touching assist  Toileting Toileting    Toileting assist Assist for toileting: Supervision/Verbal cueing     Transfers Chair/bed transfer  Transfers assist  Chair/bed transfer activity did not occur: Safety/medical concerns  Chair/bed transfer assist level: Contact Guard/Touching assist     Locomotion Ambulation   Ambulation assist   Ambulation activity did not occur: Safety/medical concerns  Assist level: Minimal Assistance - Patient > 75% Assistive device: Hand  held assist Max distance: 1108ft   Walk 10 feet activity   Assist  Walk 10 feet activity did not occur: Safety/medical concerns  Assist level: Minimal Assistance - Patient > 75% Assistive device: Hand held assist   Walk 50 feet activity   Assist Walk 50 feet with 2 turns activity did not occur: Safety/medical concerns  Assist level: Minimal Assistance - Patient > 75% Assistive device: Hand held assist    Walk 150 feet activity   Assist Walk 150 feet activity did not occur: Safety/medical concerns  Assist level: Minimal Assistance - Patient > 75% Assistive device: Hand held assist    Walk 10 feet on uneven surface  activity   Assist Walk 10 feet on uneven surfaces activity did not occur: Safety/medical concerns         Wheelchair     Assist Will patient use wheelchair at discharge?: No             Wheelchair 50 feet with 2 turns activity    Assist            Wheelchair 150 feet activity     Assist           Medical Problem List and Plan: 1.  Acute hypoxic respiratory failure secondary to acute encephalopathy due to alcohol withdrawal  Continue CIR 2.  Antithrombotics: -DVT/anticoagulation: Lovenox             -antiplatelet therapy: N/A 3. Pain Management: Neurontin 600 mg every 8 hours, Duragesic patch, oxycodone as needed             Controlled with meds on 1/2  Monitor with increased exertion 4. Mood: Valproic acid 500 mg twice daily             -antipsychotic agents: Seroquel 100 mg twice daily Zyprexa 5 mg 4 times daily as needed, Haldol 1 mg every 6 as needed  Better controlled on 1/2, plan to wean this week 5. Neuropsych: This patient is not capable of making decisions on his own behalf.  Telesitter for safety for impulsivity  Canopy bed for safety 6. Skin/Wound Care: Routine skin checks 7. Fluids/Electrolytes/Nutrition: Routine in and outs 8.  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 Bud Face.   Currently with #8 cuffed Shiley with no plan to downsize and follow-up outpatient             Duoneb PRN 9.  Gastrostomy tube 07/22/2020 per Dr. Sterling Big.  Patient currently on mechanical soft thin liquid diet. 10.  Obesity.  BMI 44.98.  Dietary follow-up.  Encourage weight loss 11.  History of tobacco/alcohol polysubstance abuse.  Counsel when appropriate. 12.  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               Hemoglobin 9.4 on 1/1  Vitamin B12 severely low, supplement initiated  Continue to monitor 13.  Constipation.  MiraLAX daily 14.  Sleep disturbance             Melatonin 5 mg nightly  ECG reviewed borderline prolonged QTC, trazodone 50 started on 1/1  Sleep chart ordered, awaiting 15.  Peripheral edema  Lasix 20 daily started on 1/1  Improving     LOS: 2 days A FACE TO FACE EVALUATION WAS PERFORMED  Leolia Vinzant Lorie Phenix 08/06/2020, 4:24 PM

## 2020-08-07 ENCOUNTER — Inpatient Hospital Stay (HOSPITAL_COMMUNITY): Payer: Medicaid Other

## 2020-08-07 ENCOUNTER — Inpatient Hospital Stay (HOSPITAL_COMMUNITY): Payer: Medicaid Other | Admitting: Occupational Therapy

## 2020-08-07 LAB — COMPREHENSIVE METABOLIC PANEL
ALT: 18 U/L (ref 0–44)
AST: 18 U/L (ref 15–41)
Albumin: 2.8 g/dL — ABNORMAL LOW (ref 3.5–5.0)
Alkaline Phosphatase: 42 U/L (ref 38–126)
Anion gap: 11 (ref 5–15)
BUN: 6 mg/dL (ref 6–20)
CO2: 27 mmol/L (ref 22–32)
Calcium: 8.8 mg/dL — ABNORMAL LOW (ref 8.9–10.3)
Chloride: 101 mmol/L (ref 98–111)
Creatinine, Ser: 0.83 mg/dL (ref 0.61–1.24)
GFR, Estimated: >60 mL/min (ref >60)
Glucose, Bld: 109 mg/dL — ABNORMAL HIGH (ref 70–99)
Potassium: 4 mmol/L (ref 3.5–5.1)
Sodium: 139 mmol/L (ref 135–145)
Total Bilirubin: 0.5 mg/dL (ref 0.3–1.2)
Total Protein: 5.9 g/dL — ABNORMAL LOW (ref 6.5–8.1)

## 2020-08-07 LAB — CBC WITH DIFFERENTIAL/PLATELET
Abs Immature Granulocytes: 0.03 10*3/uL (ref 0.00–0.07)
Basophils Absolute: 0 10*3/uL (ref 0.0–0.1)
Basophils Relative: 1 %
Eosinophils Absolute: 0.7 10*3/uL — ABNORMAL HIGH (ref 0.0–0.5)
Eosinophils Relative: 13 %
HCT: 29.7 % — ABNORMAL LOW (ref 39.0–52.0)
Hemoglobin: 9.8 g/dL — ABNORMAL LOW (ref 13.0–17.0)
Immature Granulocytes: 1 %
Lymphocytes Relative: 27 %
Lymphs Abs: 1.6 10*3/uL (ref 0.7–4.0)
MCH: 33.1 pg (ref 26.0–34.0)
MCHC: 33 g/dL (ref 30.0–36.0)
MCV: 100.3 fL — ABNORMAL HIGH (ref 80.0–100.0)
Monocytes Absolute: 0.6 10*3/uL (ref 0.1–1.0)
Monocytes Relative: 11 %
Neutro Abs: 2.9 10*3/uL (ref 1.7–7.7)
Neutrophils Relative %: 47 %
Platelets: 321 10*3/uL (ref 150–400)
RBC: 2.96 MIL/uL — ABNORMAL LOW (ref 4.22–5.81)
RDW: 13.8 % (ref 11.5–15.5)
WBC: 5.9 10*3/uL (ref 4.0–10.5)
nRBC: 0 % (ref 0.0–0.2)

## 2020-08-07 MED ORDER — OXYCODONE HCL 5 MG PO TABS
5.0000 mg | ORAL_TABLET | Freq: Four times a day (QID) | ORAL | Status: DC | PRN
  Administered 2020-08-07 – 2020-08-08 (×4): 5 mg via ORAL
  Filled 2020-08-07 (×4): qty 1

## 2020-08-07 MED ORDER — BENZOCAINE 10 % MT GEL
Freq: Four times a day (QID) | OROMUCOSAL | Status: DC | PRN
  Administered 2020-08-07: 1 via OROMUCOSAL
  Filled 2020-08-07: qty 9

## 2020-08-07 MED ORDER — OXYCODONE HCL 5 MG PO TABS: 5.0000 mg | ORAL_TABLET | Freq: Two times a day (BID) | ORAL | Status: DC | PRN

## 2020-08-07 NOTE — Progress Notes (Signed)
Physical Therapy Session Note  Patient Details  Name: Nicholas Valdez MRN: [id number] Date of Birth: 01/26/1981  Today's Date: 08/07/2020 PT Individual Time: 8588-5027  PT Missed Time: 12 minutes Missed Time Reason: fatigue and pain  PT Individual Time Calculation (min): 48 min   Short Term Goals: Week 1:  PT Short Term Goal 1 (Week 1): Pt will ambulate at least 173ft using LRAD with CGA PT Short Term Goal 2 (Week 1): Pt will ascend/descend 4 steps using HRs per home set-up with CGA PT Short Term Goal 3 (Week 1): Pt will demonstrate increased safety awareness during functional mobility via initiating a seated rest break when noticed labored breathing with only min cues 50% of the time  Skilled Therapeutic Interventions/Progress Updates:   Received pt ambulating out of bathroom without AD with NT providing supervision. Pt reported 8/10 pain in LLE (premedicated). Pt stood at sink and washed hands with supervision. Session with emphasis on functional mobility/transfers, attention, awareness, problem solving, generalized strengthening, dynamic standing balance/coordination, ambulation, and improved activity tolerance. Pt ambulated 127ft without AD and CGA to dayroom. Pt demonstrated narrow BOS, unsteadiness, and decreased bilateral foot clearance with gait. O2 sat 96% and HR 134bpm after ambulating with pt demonstrating labored breathing. Educated pt on energy conservation techniques and importance of rest breaks; pt verbalized understanding. With seated rest break HR decreased to 120bpm. Pt ambulated additional 77ft without AD and CGA to Nustep and performed BUE strengthening on Nustep at workload 6 for 4 minutes and 17 seconds for a total of 101 steps for improved cardiovascular endurance. Pt declined using BLEs on Nustep due to L foot pain. Pt then requested to stop activity and requested ice water. Pt drank 2 8oz cups of water during session and required cues for small slow sips as pt with  tendency to drink very quickly. Worked on problem solving and sequencing while seated playing connect four. Pt declined standing during activity due to increased LLE foot pain. RN notified and present during session to administer medication. Pt then requested to do a different activity and worked on dynamic sitting balance, fine motor control, visual scanning, and problem solving constructing pictures with pegboard x 2 trials with 100% accuracy demonstrating good problem solving and spatial awareness. Pt then stated "I'm sorry Tobi Bastos, I can't do this anymore I want to go back to my room." Pt transferred sit<>stand with CGA from mat table x 2 trials both with posterior LOB onto mat, then reported feeling too fatigued to ambulate back to room and requested WC. Therapist located [facility] and transported pt back to room total A. Pt transferred WC<>bed without AD and close supervision and sit<>supine with supervision. Concluded session with pt supine in enclosure bed, needs within reach, and telesitter in room. 12 minutes missed of skilled physical therapy due to pain and fatigue.   Therapy Documentation Precautions:  Precautions Precautions: Fall,Other (comment) Precaution Comments: capped trach monitor SpO2 and HR, PEG, decreased safety awareness with impulsivity and restlessness Restrictions Weight Bearing Restrictions: No  Therapy/Group: Individual Therapy Jeremaine Maraj PT, DPT   08/07/2020, 7:35 AM

## 2020-08-07 NOTE — Progress Notes (Signed)
[facility] Individual Statement of Services  Patient Name:  ITAY MELLA  Date:  08/07/2020  Welcome to the Inpatient Rehabilitation [facility].  Our goal is to provide you with an individualized program based on your diagnosis and situation, designed to meet your specific needs.  With this comprehensive rehabilitation program, you will be expected to participate in at least 3 hours of rehabilitation therapies Monday-Friday, with modified therapy programming on the weekends.  Your rehabilitation program will include the following services:  Physical Therapy (PT), Occupational Therapy (OT), Speech Therapy (ST), 24 hour per day rehabilitation nursing, Neuropsychology, Care Coordinator, Rehabilitation Medicine, Nutrition Services and Pharmacy Services  Weekly team conferences will be held on Wednesday to discuss your progress.  Your Inpatient Rehabilitation Care Coordinator will talk with you frequently to get your input and to update you on team discussions.  Team conferences with you and your family in attendance may also be held.  Expected length of stay: 10-12 days  Overall anticipated outcome: supervision with cueing  Depending on your progress and recovery, your program may change. Your Inpatient Rehabilitation Care Coordinator will coordinate services and will keep you informed of any changes. Your Inpatient Rehabilitation Care Coordinator's name and contact numbers are listed  below.  The following services may also be recommended but are not provided by the Inpatient Rehabilitation Center:   Driving Evaluations  Home Health Rehabiltiation Services  Outpatient Rehabilitation Services    Arrangements will be made to provide these services after discharge if needed.  Arrangements include referral to agencies that provide these services.  Your insurance has been verified to be:  Medicaid-[place] Your primary doctor is:  Event organiser  Pertinent information will be shared  with your doctor and your insurance company.  Inpatient Rehabilitation Care Coordinator:  Dossie Der, [place] [phone] or Luna Glasgow  Information discussed with and copy given to patient by: Lucy Chris, 08/07/2020, 10:46 AM

## 2020-08-07 NOTE — Progress Notes (Signed)
Occupational Therapy Session Note  Patient Details  Name: Nicholas Valdez MRN: [id number] Date of Birth: 03-13-1981  Today's Date: 08/07/2020 OT Individual Time: 9480-1655 OT Individual Time Calculation (min): 70 min    Short Term Goals: Week 1:  OT Short Term Goal 1 (Week 1): STGs=LTGs due to ELOS  Skilled Therapeutic Interventions/Progress Updates:    Treatment session with focus on functional mobility, dynamic standing balance, and cognition.  Pt received on toilet with nurse tech present, pt completed toileting independently.  Pt ambulated around room with CGA due to internal and external distraction.  Pt reports decreased balance reactions due to foam padding on L heel, therapist educated on purpose of foam padding.  Pt declined anything bathing or dressing this session, although towards the end of the session pt complained about how dirty his shirt was.  Pt ambulated to Dayroom 150' with CGA due to intermittent staggering gait.  Pt returned to room to toilet.  Pt asking questions about working memory and establishing a functional routine.  Therapist encouraged pt to write out an ideal schedule for hospital stay to focus on working memory and organization.  Pt required mod redirection to task as pt easily distracted and tangential.  Discussed establishing set therapy schedule to allow for increased organization, recall, and aid in setting routine.  Discussed need to be adaptable and flexible, especially while in hospital setting and needing to rely on outside assistance for mobility and medication.  Engaged in organization exercise with focus on pt putting steps of familiar activities in order with decreased verbal recall but able to complete when given in written form.  Pt ambulated to/from toilet x4 during session with CGA when ambulating in room and then pt closing door once entering bathroom, therefore completing toilet transfer and toileting Independently.  Pt returned to bed at end of  session and returned to supine with enclosure bed closed up.  Therapy Documentation Precautions:  Precautions Precautions: Fall,Other (comment) Precaution Comments: capped trach monitor SpO2 and HR, PEG, decreased safety awareness with impulsivity and restlessness Restrictions Weight Bearing Restrictions: No General:   Vital Signs: Therapy Vitals Temp: 98.6 F (37 C) Temp Source: Oral Pulse Rate: 92 Resp: 17 BP: 128/80 Patient Position (if appropriate): Lying Oxygen Therapy SpO2: 98 % O2 Device: Room Air (Capped trach) FiO2 (%): 21 % Pain: Pain Assessment Pain Scale: 0-10 Pain Score: 5  Pain Location: Mouth Pain Orientation: Left Pain Intervention(s): Medication (See eMAR)   Therapy/Group: Individual Therapy  Rosalio Loud 08/07/2020, 12:16 PM

## 2020-08-07 NOTE — Progress Notes (Signed)
Inpatient Rehabilitation  Patient information reviewed and entered into eRehab system by Hero Mccathern M. Panagiota Perfetti, M.A., CCC/SLP, PPS Coordinator.  Information including medical coding, functional ability and quality indicators will be reviewed and updated through discharge.    

## 2020-08-07 NOTE — Progress Notes (Addendum)
[place] PHYSICAL MEDICINE & REHABILITATION PROGRESS NOTE  Subjective/Complaints: Patient pleasant and cooperative this morning Hgb uptrending  He requested oxycodone be decreased to q12H PRN given history of prior addiction, but then was asking nursing for oxycodone during the day so changed back to q6H ORN  ROS: Denies CP, SOB, N/V/D  Objective: Vital Signs: Blood pressure 128/80, pulse 92, temperature 98.6 F (37 C), temperature source Oral, resp. rate 17, height 5\' 10"  (1.778 m), weight (!) 142.2 kg, SpO2 98 %. No results found. Recent Labs    08/05/20 0651 08/07/20 0716  WBC 7.5 5.9  HGB 9.4* 9.8*  HCT 29.5* 29.7*  PLT 321 321   Recent Labs    08/05/20 0651 08/07/20 0716  NA  --  139  K  --  4.0  CL  --  101  CO2  --  27  GLUCOSE  --  109*  BUN  --  6  CREATININE 0.75 0.83  CALCIUM  --  8.8*    Intake/Output Summary (Last 24 hours) at 08/07/2020 1222 Last data filed at 08/07/2020 0725 Gross per 24 hour  Intake 1467 ml  Output --  Net 1467 ml     Pressure Injury 07/26/20 Heel Left;Lateral Deep Tissue Pressure Injury - Purple or maroon localized area of discolored intact skin or blood-filled blister due to damage of underlying soft tissue from pressure and/or shear. purple and pink blistered area  (Active)  07/26/20 0730  Location: Heel  Location Orientation: Left;Lateral  Staging: Deep Tissue Pressure Injury - Purple or maroon localized area of discolored intact skin or blood-filled blister due to damage of underlying soft tissue from pressure and/or shear.  Wound Description (Comments): purple and pink blistered area on L  lateral heel  Present on Admission: -- (possible mistake on chart, maybe prior R heel documented by mistake)    Physical Exam: BP 128/80 (BP Location: Left Arm)   Pulse 92   Temp 98.6 F (37 C) (Oral)   Resp 17   Ht 5\' 10"  (1.778 m)   Wt (!) 142.2 kg   SpO2 98%   BMI 44.98 kg/m   Gen: no distress, normal appearing HEENT: oral  mucosa pink and moist, NCAT Cardio: Tachycardia. Chest: normal effort, normal rate of breathing Abd: soft, non-distended Ext: no edema Skin: intact Psych: Verbose.  Pleasant Musc: Lower extremity edema, stable.  No tenderness in extremities. Motor: 4-4+/5 throughout  Assessment/Plan: 1. Functional deficits which require 3+ hours per day of interdisciplinary therapy in a comprehensive inpatient rehab setting.  Physiatrist is providing close team supervision and 24 hour management of active medical problems listed below.  Physiatrist and rehab team continue to assess barriers to discharge/monitor patient progress toward functional and medical goals   Care Tool:  Bathing    Body parts bathed by patient: Right arm,Left arm,Chest,Abdomen,Front perineal area,Buttocks,Right upper leg,Left upper leg,Right lower leg,Left lower leg,Face         Bathing assist Assist Level: Contact Guard/Touching assist     Upper Body Dressing/Undressing Upper body dressing   What is the patient wearing?: Pull over shirt    Upper body assist Assist Level: Contact Guard/Touching assist (standing)    Lower Body Dressing/Undressing Lower body dressing      What is the patient wearing?: Pants     Lower body assist Assist for lower body dressing: Contact Guard/Touching assist     Toileting Toileting    Toileting assist Assist for toileting: Independent     Transfers Chair/bed  transfer  Transfers assist  Chair/bed transfer activity did not occur: Safety/medical concerns  Chair/bed transfer assist level: Contact Guard/Touching assist     Locomotion Ambulation   Ambulation assist   Ambulation activity did not occur: Safety/medical concerns  Assist level: Contact Guard/Touching assist (walking to the bathroom) Assistive device: Hand held assist Max distance: 158ft   Walk 10 feet activity   Assist  Walk 10 feet activity did not occur: Safety/medical concerns  Assist level:  Minimal Assistance - Patient > 75% Assistive device: Hand held assist   Walk 50 feet activity   Assist Walk 50 feet with 2 turns activity did not occur: Safety/medical concerns  Assist level: Minimal Assistance - Patient > 75% Assistive device: Hand held assist    Walk 150 feet activity   Assist Walk 150 feet activity did not occur: Safety/medical concerns  Assist level: Minimal Assistance - Patient > 75% Assistive device: Hand held assist    Walk 10 feet on uneven surface  activity   Assist Walk 10 feet on uneven surfaces activity did not occur: Safety/medical concerns         Wheelchair     Assist Will patient use wheelchair at discharge?: No             Wheelchair 50 feet with 2 turns activity    Assist            Wheelchair 150 feet activity     Assist           Medical Problem List and Plan: 1.  Acute hypoxic respiratory failure secondary to acute encephalopathy due to alcohol withdrawal  Continue CIR 2.  Antithrombotics: -DVT/anticoagulation: Lovenox             -antiplatelet therapy: N/A 3. Pain Management: Neurontin 600 mg every 8 hours, Duragesic patch, oxycodone as needed             Controlled with meds on 1/3- continue oxycodone q6H prn. Oragel ordered for dental pain  Monitor with increased exertion 4. Mood: Valproic acid 500 mg twice daily             -antipsychotic agents: Seroquel 100 mg twice daily Zyprexa 5 mg 4 times daily as needed, Haldol 1 mg every 6 as needed  Better controlled on 1/2, plan to wean this week  He feels Ativan is helping, continue for now, does sedate him so nursing has been giving after therapy sessions 5. Neuropsych: This patient is not capable of making decisions on his own behalf.  Continue Telesitter for safety for impulsivity  Canopy bed for safety 6. Skin/Wound Care: Routine skin checks 7. Fluids/Electrolytes/Nutrition: Routine in and outs 8.  Respiratory failure:  Tracheostomy 07/10/2020  per Dr. Bud Face.  Currently with #8 cuffed Shiley with no plan to downsize and follow-up outpatient             Duoneb PRN 9.  Gastrostomy tube 07/22/2020 per Dr. Sterling Big.  Patient currently on mechanical soft thin liquid diet. 10.  Obesity.  BMI 44.98.  Dietary follow-up.  Encourage weight loss 11.  History of tobacco/alcohol polysubstance abuse.  Counsel when appropriate. 12.  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               Hemoglobin 9.4 on 1/1  Vitamin B12 severely low, supplement initiated  Continue to monitor 13.  Constipation.  MiraLAX daily 14.  Sleep disturbance             Melatonin 5 mg  nightly  ECG reviewed borderline prolonged QTC, trazodone 50 started on 1/1  Sleep chart ordered, awaiting 15.  Peripheral edema  Lasix 20 daily started on 1/1  Improving     LOS: 3 days A FACE TO FACE EVALUATION WAS PERFORMED  Clint Bolder P Torri Michalski 08/07/2020, 12:22 PM

## 2020-08-07 NOTE — Progress Notes (Signed)
Inpatient Rehabilitation Care Coordinator Assessment and Plan Patient Details  Name: Nicholas Valdez MRN: [id number] Date of Birth: [birth year]  Today's Date: 08/07/2020  Hospital Problems: Principal Problem:   Acute metabolic encephalopathy Active Problems:   Peripheral edema   Impulsive  Past Medical History:  Past Medical History:  Diagnosis Date  . Alcohol abuse   . Anxiety   . Depression   . Drug abuse (HCC)   . GERD (gastroesophageal reflux disease)   . Hepatitis C   . Hypertension   . Pneumonia   . Pre-diabetes   . Sleep apnea    Past Surgical History:  Past Surgical History:  Procedure Laterality Date  . GASTROSTOMY W/ FEEDING TUBE Left 07/22/2020  . TRACHEOSTOMY TUBE PLACEMENT N/A 07/10/2020   Procedure: TRACHEOSTOMY;  Surgeon: Bud Face, MD;  Location: [facility] ORS;  Service: ENT;  Laterality: N/A;   Social History:  reports that he has been smoking. He has never used smokeless tobacco. He reports current alcohol use. He reports current drug use. Drug: Marijuana.  Family / Support Systems Marital Status: Divorced Patient Roles: Parent Children: 71 yo daughter Other Supports: Claudean Kinds [phone]   Buck-Dad [phone] Anticipated Caregiver: Clydie Braun Ability/Limitations of Caregiver: cares for granddaughter-pt's daughter Caregiver Availability: Other (Comment) (Will try to provide 24/7 at times has to run errands) Family Dynamics: Close knit family who try to be there for one another, pt has been challenging with his addiction. They do their best and will take pt in and provide for his needs.  Social History Preferred language: English Religion: None Cultural Background: No issues Education: HS Read: Yes Write: Yes Employment Status: Unemployed Marine scientist Issues: No issues Guardian/Conservator: None-according to MD pt is not fully capable of making his own decisions while here. Will look toward his parents since there is no formal  POA/HCPOA in place.   Abuse/Neglect Abuse/Neglect Assessment Can Be Completed: Yes Physical Abuse: Denies Verbal Abuse: Denies Sexual Abuse: Denies Exploitation of patient/patient's resources: Denies Self-Neglect: Denies  Emotional Status Pt's affect, behavior and adjustment status: Pt is alert and oriented to self and place not really able to understand why here and trying to pull out IV and get out of the bed. Mom reports he was independent prior to admission and was trying to get help for his ETOH abuse. It is just a hard thing she reports. Recent Psychosocial Issues: other health issues Psychiatric History: No history will need to see neuro-psych when appropriate for substance abuse issues and encephalopathy. Will get input from team when ready. Substance Abuse History: ETOH issues has been to [facility] for assist numerous times but seems to relapse each time  Patient / Family Perceptions, Expectations & Goals Pt/Family understanding of illness & functional limitations: Pt does not seem to undertand reason he is in the hospital, Mom has spoken with the MD and seems to have a basic understanding but hopeful he will do well and become more oriented before going home. She is also concerned about going home with a trach he tends to pull things out Premorbid pt/family roles/activities: Son, father, friend, etc Anticipated changes in roles/activities/participation: resume Pt/family expectations/goals: Pt states: " I want to go home and get out of here."  Mom states: " I'm glad he is there but hope he does well there."  [employer]: Other (Comment) ([facility] Substance abuse services) Premorbid Home Care/DME Agencies: None Transportation available at discharge: Parents Resource referrals recommended: Neuropsychology  Discharge Planning Living Arrangements: Children,Parent Support Systems: Children,Parent,Friends/neighbors Type  of Residence: Private residence Insurance  Resources: [employer] (specify county) Architect: Family Youth worker Screen Referred: Previously completed Living Expenses: Lives with family Money Management: Family Does the patient have any problems obtaining your medications?: No Home Management: Mom Patient/Family Preliminary Plans: Return home with Mom and daughter, hopefully Mom will be able to manage him and his trach. Pt does not have insurance to cover SNF. Goals set for supervision level with cueing. Work on a safe plan for him. Care Coordinator Barriers to Discharge: Trach,Medication compliance,Behavior Care Coordinator Barriers to Discharge Comments: Pt will be going home with trach need to make sure Mom knows how to manage it prior to discharge. Care Coordinator Anticipated Follow Up Needs: HH/OP  Clinical Impression Pt is oriented x 2 and wants to go home. Parents are involved and supportive. He lives with his Mom along with his 67 yo daughter. Mom will be his primary caregiver at discharge, will need to make sure she is comfortable with his trach care, prior to discharge. Will see if neuro-psych can see soon.   Lucy Chris 08/07/2020, 11:06 AM

## 2020-08-07 NOTE — Progress Notes (Signed)
Speech Language Pathology Daily Session Note  Patient Details  Name: Nicholas Valdez MRN: [id number] Date of Birth: January 11, 1981  Today's Date: 08/07/2020 SLP Individual Time: 0900-0930 SLP Individual Time Calculation (min): 30 min  Short Term Goals: Week 1: SLP Short Term Goal 1 (Week 1): Pt will increase sustained attention to 5 minutes with mod A SLP Short Term Goal 2 (Week 1): Pt will increase simple problem solving for current environment with mod A SLP Short Term Goal 3 (Week 1): Pt will increase functional recall with use of compensatory memory strategies and visual aids with mod A SLP Short Term Goal 4 (Week 1): Pt will consume trials regular solids and thin liquid via straw with min A for use of compensatory strategies  Skilled Therapeutic Interventions:Skilled ST services focused on cognitive skills. SLP continued assessment of cognitive linguistic skills due to sustained attention appearing much improved from on evaluation date. Pt scored 23 out 30 (n=>27) on SLUMS, indicating deficits in memory and attention. Pt supports baseline deficits in short term memory as well as ability to list memory strategies such as associations and mnemonic devices mod I. Pt demonstrated fast rate of speech and had difficulty remaining on topic, however was easily redirected. Pt expressed desire to return home and SLP provided education pertaining to increase safety awareness and reducing impulsivity, pt was agreeable. SLP recommends continued cognitive assessments in mildly complex problem solving in upcoming session. Pt was left with posey bed secure and call bell within reach. Recommend to continue skilled ST services.     Pain Pain Assessment Pain Score: 0-No pain  Therapy/Group: Individual Therapy  Kimberleigh Mehan  [facility] 08/07/2020, 3:47 PM

## 2020-08-08 ENCOUNTER — Inpatient Hospital Stay (HOSPITAL_COMMUNITY): Payer: Medicaid Other | Admitting: Occupational Therapy

## 2020-08-08 ENCOUNTER — Inpatient Hospital Stay (HOSPITAL_COMMUNITY): Payer: Medicaid Other

## 2020-08-08 ENCOUNTER — Inpatient Hospital Stay (HOSPITAL_COMMUNITY): Payer: Medicaid Other | Admitting: Speech Pathology

## 2020-08-08 DIAGNOSIS — M79672 Pain in left foot: Secondary | ICD-10-CM

## 2020-08-08 DIAGNOSIS — R4587 Impulsiveness: Secondary | ICD-10-CM | POA: Diagnosis not present

## 2020-08-08 DIAGNOSIS — G9341 Metabolic encephalopathy: Secondary | ICD-10-CM | POA: Diagnosis not present

## 2020-08-08 DIAGNOSIS — R609 Edema, unspecified: Secondary | ICD-10-CM | POA: Diagnosis not present

## 2020-08-08 DIAGNOSIS — G479 Sleep disorder, unspecified: Secondary | ICD-10-CM | POA: Diagnosis not present

## 2020-08-08 MED ORDER — MUPIROCIN 2 % EX OINT
TOPICAL_OINTMENT | Freq: Two times a day (BID) | CUTANEOUS | Status: DC
  Administered 2020-08-08 – 2020-08-09 (×2): 1 via TOPICAL
  Filled 2020-08-08: qty 22

## 2020-08-08 MED ORDER — OXYCODONE HCL 5 MG PO TABS
5.0000 mg | ORAL_TABLET | Freq: Two times a day (BID) | ORAL | Status: DC | PRN
  Administered 2020-08-08 – 2020-08-11 (×5): 5 mg via ORAL
  Filled 2020-08-08 (×7): qty 1

## 2020-08-08 MED ORDER — LIDOCAINE 5 % EX PTCH
1.0000 | MEDICATED_PATCH | CUTANEOUS | Status: DC
  Administered 2020-08-08 – 2020-08-10 (×3): 1 via TRANSDERMAL
  Filled 2020-08-08 (×3): qty 1

## 2020-08-08 NOTE — Progress Notes (Signed)
Physical Therapy Session Note  Patient Details  Name: Nicholas Valdez MRN: [id number] Date of Birth: [birth year]  Today's Date: 08/08/2020 PT Individual Time: 1000-1101 PT Individual Time Calculation (min): 61 min  Today's Date: 08/08/2020 PT Missed Time: 14 Minutes Missed Time Reason: Pain;Patient fatigue  Short Term Goals: Week 1:  PT Short Term Goal 1 (Week 1): Pt will ambulate at least 179ft using LRAD with CGA PT Short Term Goal 2 (Week 1): Pt will ascend/descend 4 steps using HRs per home set-up with CGA PT Short Term Goal 3 (Week 1): Pt will demonstrate increased safety awareness during functional mobility via initiating a seated rest break when noticed labored breathing with only min cues 50% of the time  Skilled Therapeutic Interventions/Progress Updates:   Received pt standing beside bed with RN present administering medications. Pt reported sitting down was hurting his buttocks due to pressure from ted hose and requested to stand. Pt agreeable to therapy and reported 8/10 in LLE. Repositioning, rest breaks, and distraction done to reduce pain levels. Session with emphasis on discharge planning, functional mobility/transfers, generalized strengthening, dynamic standing balance/coordinaiton, ambulation, simulated car transfer, stair navigation, and improved activity tolerance. Pt requested to go to gym in [facility] this morning due to pain in R foot but agreed to ambulate back to room at end of session. Pt performed WC mobility >140ft using BUE and supervision to therapy gym and transported remainder of way to ortho gym in [facility] total A. O2 sat 97% and HR 115bpm after activity. Pt with SOB and required rest/water breaks throughout session. Therapist switched out 18x18 WC for 22x18 WC for improved fit and comfort. Pt requested to work on upper body exercises today and performed BUE strengthening on UBE at level 3 for 3 minutes forward and 4 minutes backwards for improved cardiovascular endurance. HR  123bpm after activity. Pt then expressed to therapist that he wanted to leave tomorrow. Therapist explained tasks required for pt to perform prior to D/C and need for 24/7 supervision; pt agreeable. Treatment team notified. Pt performed ambulatory simulated car transfer without AD and supervision entering by side stepping into car. Pt required cues for safety and to avoid pulling on mobile door frame. Pt ambulated 32ft on uneven surfaces (ramp and mulch) and navigated 1 curb without AD and occasional use of rail with close supervision. Pt transported to therapy gym and navigated 12 steps with 2 rails and supervision ascending and descending with a step through pattern. HR 136bpm decreasing to 112bpm with seated rest beak. Pt stood and picked up small cup without AD and close supervision without LOB. Pt reported feeling "exhausted" but when asked to rate fatigue level; reported only 4/10. Pt ambulated 144ft without AD and close supervision (CGA for last 70ft) back to room. No LOB noted, however pt does appear to be mildly unsteady overall. Pt requested to see if he could fit shoes on and attempted to don shoes mod I sitting EOB however was unable to get them on due to swelling. Concluded session with pt sitting EOB drinking water and ensure. NT aware of pt's current status and providing distant supervision. 14 minutes missed of skilled physical therapy due to pain and fatigue.   Therapy Documentation Precautions:  Precautions Precautions: Fall,Other (comment) Precaution Comments: capped trach monitor SpO2 and HR, PEG, decreased safety awareness with impulsivity and restlessness Restrictions Weight Bearing Restrictions: No  Therapy/Group: Individual Therapy Nelvin Tomb PT, DPT   08/08/2020, 7:26 AM

## 2020-08-08 NOTE — Progress Notes (Signed)
Speech Language Pathology Daily Session Note  Patient Details  Name: Nicholas Valdez MRN: [id number] Date of Birth: 16-May-1981  Today's Date: 08/08/2020 SLP Individual Time: 5277-8242 SLP Individual Time Calculation (min): 55 min  Short Term Goals: Week 1: SLP Short Term Goal 1 (Week 1): Pt will increase sustained attention to 5 minutes with mod A SLP Short Term Goal 2 (Week 1): Pt will increase simple problem solving for current environment with mod A SLP Short Term Goal 3 (Week 1): Pt will increase functional recall with use of compensatory memory strategies and visual aids with mod A SLP Short Term Goal 4 (Week 1): Pt will consume trials regular solids and thin liquid via straw with min A for use of compensatory strategies  Skilled Therapeutic Interventions: Pt was seen for skilled ST targeting cognitive skills. SLP facilitated session with overall Min A verbal and visual cueing for error awareness and problem solving strategies during a basic money management task using coins and cash. Pt with most difficulty problem solving subtracting/calculating change, however other errors also made when totaling and displaying set amounts. Pt completed basic medication management task in which he interpreted medication labels and organized a QID pill chart from the [facility] with 100% accuracy provided Supervision A verbal cues. He was moderately distractible this session, requiring Mod A verbal cues for redirection to task and to maintain appropriate topics of conversation. Pt able to recall "working memory" as primary target of last ST session without cues. Pt left laying in bed with needs within reach. Continue per current plan of care.          Pain Pain Assessment Pain Scale: Faces Faces Pain Scale: No hurt  Therapy/Group: Individual Therapy  Little Ishikawa 08/08/2020, 7:27 AM

## 2020-08-08 NOTE — Consult Note (Addendum)
WOC Nurse Consult Note: Patient receiving care in [facility] [phone]. Reason for Consult: left foot wound Wound type: unclear etiology Pressure Injury POA: Yes Measurement: 3 cm x 3 cm x no measureable depth Wound bed: thin yellow Drainage (amount, consistency, odor) no odor detected Periwound: peeled Dressing procedure/placement/frequency: Wash left heel wound with soap and water, pat dry. Apply mupirocin ointment to wound, cover with foam dressing. Monitor the wound area(s) for worsening of condition such as: Signs/symptoms of infection,  Increase in size,  Development of or worsening of odor, Development of pain, or increased pain at the affected locations.  Notify the medical team if any of these develop.  Thank you for the consult.  Discussed plan of care with the patient.  WOC nurse will not follow at this time.  Please re-consult the WOC team if needed.  Helmut Muster, RN, MSN, CWOCN, CNS-BC, pager [phone]

## 2020-08-08 NOTE — Progress Notes (Signed)
[place] PHYSICAL MEDICINE & REHABILITATION PROGRESS NOTE  Subjective/Complaints: Patient seen sitting up at the edge of his bed this morning.  He states he slept well overnight, slept fairly well per sleep chart.  He is questions regarding discharge.  He notes pain in left foot and would like pain medications increased again.  Discussed other options with patient.  ROS: Denies CP, SOB, N/V/D  Objective: Vital Signs: Blood pressure 127/77, pulse (!) 103, temperature 97.8 F (36.6 C), temperature source Oral, resp. rate 16, height 5\' 10"  (1.778 m), weight (!) 142.2 kg, SpO2 98 %. No results found. Recent Labs    08/07/20 0716  WBC 5.9  HGB 9.8*  HCT 29.7*  PLT 321   Recent Labs    08/07/20 0716  NA 139  K 4.0  CL 101  CO2 27  GLUCOSE 109*  BUN 6  CREATININE 0.83  CALCIUM 8.8*    Intake/Output Summary (Last 24 hours) at 08/08/2020 1122 Last data filed at 08/08/2020 0700 Gross per 24 hour  Intake 1640 ml  Output -  Net 1640 ml     Pressure Injury 07/26/20 Heel Left;Lateral Deep Tissue Pressure Injury - Purple or maroon localized area of discolored intact skin or blood-filled blister due to damage of underlying soft tissue from pressure and/or shear. purple and pink blistered area  (Active)  07/26/20 0730  Location: Heel  Location Orientation: Left;Lateral  Staging: Deep Tissue Pressure Injury - Purple or maroon localized area of discolored intact skin or blood-filled blister due to damage of underlying soft tissue from pressure and/or shear.  Wound Description (Comments): purple and pink blistered area on L  lateral heel  Present on Admission: -- (possible mistake on chart, maybe prior R heel documented by mistake)    Physical Exam: BP 127/77 (BP Location: Left Arm)   Pulse (!) 103   Temp 97.8 F (36.6 C) (Oral)   Resp 16   Ht 5\' 10"  (1.778 m)   Wt (!) 142.2 kg   SpO2 98%   BMI 44.98 kg/m   Constitutional: No distress . Vital signs reviewed. HENT:  Normocephalic.  Atraumatic. Eyes: EOMI. No discharge. Cardiovascular: No JVD.  RRR. Respiratory: Normal effort.  No stridor.  Bilateral clear to auscultation. GI: Non-distended.  BS +. Skin: Warm and dry.  DTI left lateral heel Psych: Normal mood.  Normal behavior. Musc: Lower extremity edema, some improvement No tenderness in extremities. Psych: Verbose.  Perseverative at times. Neuro: Alert Motor: 4-4+/5 throughout, unchanged  Assessment/Plan: 1. Functional deficits which require 3+ hours per day of interdisciplinary therapy in a comprehensive inpatient rehab setting.  Physiatrist is providing close team supervision and 24 hour management of active medical problems listed below.  Physiatrist and rehab team continue to assess barriers to discharge/monitor patient progress toward functional and medical goals   Care Tool:  Bathing    Body parts bathed by patient: Right arm,Left arm,Chest,Abdomen,Front perineal area,Buttocks,Right upper leg,Left upper leg,Right lower leg,Left lower leg,Face         Bathing assist Assist Level: Contact Guard/Touching assist     Upper Body Dressing/Undressing Upper body dressing   What is the patient wearing?: Pull over shirt    Upper body assist Assist Level: Contact Guard/Touching assist (standing)    Lower Body Dressing/Undressing Lower body dressing      What is the patient wearing?: Pants     Lower body assist Assist for lower body dressing: Contact Guard/Touching assist     Toileting Toileting  Toileting assist Assist for toileting: Independent     Transfers Chair/bed transfer  Transfers assist  Chair/bed transfer activity did not occur: Safety/medical concerns  Chair/bed transfer assist level: Supervision/Verbal cueing     Locomotion Ambulation   Ambulation assist   Ambulation activity did not occur: Safety/medical concerns  Assist level: Supervision/Verbal cueing Assistive device: No Device Max distance:  178ft   Walk 10 feet activity   Assist  Walk 10 feet activity did not occur: Safety/medical concerns  Assist level: Supervision/Verbal cueing Assistive device: No Device   Walk 50 feet activity   Assist Walk 50 feet with 2 turns activity did not occur: Safety/medical concerns  Assist level: Supervision/Verbal cueing Assistive device: No Device    Walk 150 feet activity   Assist Walk 150 feet activity did not occur: Safety/medical concerns  Assist level: Supervision/Verbal cueing Assistive device: No Device    Walk 10 feet on uneven surface  activity   Assist Walk 10 feet on uneven surfaces activity did not occur: Safety/medical concerns   Assist level: Supervision/Verbal cueing     Wheelchair     Assist Will patient use wheelchair at discharge?: No Type of Wheelchair: Manual    Wheelchair assist level: Supervision/Verbal cueing Max wheelchair distance: 127ft    Wheelchair 50 feet with 2 turns activity    Assist        Assist Level: Supervision/Verbal cueing   Wheelchair 150 feet activity     Assist      Assist Level: Supervision/Verbal cueing    Medical Problem List and Plan: 1.  Acute hypoxic respiratory failure secondary to acute encephalopathy due to alcohol withdrawal  Continue CIR 2.  Antithrombotics: -DVT/anticoagulation: Lovenox             -antiplatelet therapy: N/A 3. Pain Management: Neurontin 600 mg every 8 hours, Duragesic patch  Oxycodone as needed decreased to every 12 as needed in anticipation of discharge soon             Lidoderm patch ordered for left foot on 1/4  Oragel ordered for dental pain  Monitor with increased exertion 4. Mood: Valproic acid 500 mg twice daily             -antipsychotic agents: Seroquel 100 mg twice daily Zyprexa 5 mg 4 times daily as needed, Haldol 1 mg every 6 as needed  He feels Ativan is helping, continue for now, does sedate him so nursing has been giving after therapy sessions 5.  Neuropsych: This patient is not capable of making decisions on his own behalf.  Continue telesitter for safety for impulsivity  Canopy bed for safety, will DC 6. Skin/Wound Care: Routine skin checks 7. Fluids/Electrolytes/Nutrition: Routine in and outs 8.  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 Bud Face.  Currently with #8 cuffed Shiley with no plan to downsize and follow-up outpatient             Duoneb PRN 9.  Gastrostomy tube 07/22/2020 per Dr. Sterling Big.  Patient currently on mechanical soft thin liquid diet. 10.  Obesity.  BMI 44.98.  Dietary follow-up.  Encourage weight loss 11.  History of tobacco/alcohol polysubstance abuse.  Counsel when appropriate. 12.  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               Hemoglobin 9.8 on 1/3  Vitamin B12 severely low, supplement initiated  Continue to monitor 13.  Constipation.  MiraLAX daily 14.  Sleep disturbance  Melatonin 5 mg nightly  ECG reviewed borderline prolonged QTC, trazodone 50 started on 1/1  Continue sleep chart 15.  Peripheral edema  Lasix 20 daily started on 1/1  Improving   LOS: 4 days A FACE TO FACE EVALUATION WAS PERFORMED  Milad Bublitz Lorie Phenix 08/08/2020, 11:22 AM

## 2020-08-08 NOTE — Progress Notes (Signed)
Occupational Therapy Session Note  Patient Details  Name: Nicholas Valdez MRN: [id number] Date of Birth: 04/01/1981  Today's Date: 08/08/2020 OT Individual Time: 1400-1500 OT Individual Time Calculation (min): 60 min    Short Term Goals: Week 1:  OT Short Term Goal 1 (Week 1): STGs=LTGs due to ELOS  Skilled Therapeutic Interventions/Progress Updates:    Treatment session with focus on self-care retraining, endurance, and safety with bathing/dressing.  Pt received upright seated at edge of enclosure bed.  Pt still agreeable to shower during this session.  Therapist educated pt on shower shield for trach to ensure site remains dry, pt required assistance to don appropriately.  Pt advocated for self by reporting need to ask for medications prior to shower.  Pt called RN and gathered all items while waiting for RN to come to administer meds.  Pt ambulated to bathroom with supervision.  Therapist provided min-mod cues for sequencing and setup for shower while reiterating use of shower seat for energy conservation.  Pt completed bathing at sit > stand level with cues for recall and sequencing to ensure thoroughness with bathing.  Pt reports fatigue after shower, requiring setup for items and cues to attempt tasks prior to asking for assistance.  Pt donned underwear and shirt with supervision at sit > stand level and then ambulated back to bed.  Pt sat for a few mins prior to donning pants sit > stand.  Pt HR increased to 136 after shower, but after a few mins rest returned to 107 and O2 sats remained mid 90s on room air.  RN returned to change dressings after shower.  Pt remained seated at edge of enclosure bed with RN present.  Therapy Documentation Precautions:  Precautions Precautions: Fall,Other (comment) Precaution Comments: capped trach monitor SpO2 and HR, PEG, decreased safety awareness with impulsivity and restlessness Restrictions Weight Bearing Restrictions: No General:   Vital  Signs: Therapy Vitals Temp: 98.3 F (36.8 C) Pulse Rate: (!) 109 Resp: 16 BP: 110/77 Patient Position (if appropriate): Sitting Oxygen Therapy SpO2: 98 % O2 Device: Room Air Pain:  Pt with c/o pain in L foot, premedicated.   Therapy/Group: Individual Therapy  Rosalio Loud 08/08/2020, 3:55 PM

## 2020-08-09 ENCOUNTER — Inpatient Hospital Stay (HOSPITAL_COMMUNITY): Payer: Medicaid Other

## 2020-08-09 ENCOUNTER — Inpatient Hospital Stay (HOSPITAL_COMMUNITY): Payer: Medicaid Other | Admitting: Occupational Therapy

## 2020-08-09 DIAGNOSIS — G9341 Metabolic encephalopathy: Secondary | ICD-10-CM | POA: Diagnosis not present

## 2020-08-09 DIAGNOSIS — R609 Edema, unspecified: Secondary | ICD-10-CM | POA: Diagnosis not present

## 2020-08-09 DIAGNOSIS — M79672 Pain in left foot: Secondary | ICD-10-CM | POA: Diagnosis not present

## 2020-08-09 DIAGNOSIS — G479 Sleep disorder, unspecified: Secondary | ICD-10-CM | POA: Diagnosis not present

## 2020-08-09 MED ORDER — LIDOCAINE 5 % EX PTCH: 1.0000 | MEDICATED_PATCH | CUTANEOUS | 0 refills | Status: DC

## 2020-08-09 MED ORDER — LORAZEPAM 0.5 MG PO TABS: 0.5000 mg | ORAL_TABLET | ORAL | 0 refills | Status: DC | PRN

## 2020-08-09 MED ORDER — PANTOPRAZOLE SODIUM 40 MG PO TBEC: 40.0000 mg | DELAYED_RELEASE_TABLET | Freq: Every day | ORAL | 0 refills | Status: DC

## 2020-08-09 MED ORDER — FUROSEMIDE 40 MG PO TABS: 40.0000 mg | ORAL_TABLET | Freq: Every day | ORAL | 0 refills | Status: DC

## 2020-08-09 MED ORDER — MAGNESIUM OXIDE 400 (241.3 MG) MG PO TABS: 400.0000 mg | ORAL_TABLET | Freq: Two times a day (BID) | ORAL | 0 refills | Status: DC

## 2020-08-09 MED ORDER — FENTANYL 100 MCG/HR TD PT72
1.0000 | MEDICATED_PATCH | TRANSDERMAL | 0 refills | Status: DC
Start: 2020-08-09 — End: 2020-09-04

## 2020-08-09 MED ORDER — QUETIAPINE FUMARATE 100 MG PO TABS: 100.0000 mg | ORAL_TABLET | Freq: Two times a day (BID) | ORAL | 0 refills | Status: DC

## 2020-08-09 MED ORDER — DOCUSATE SODIUM 100 MG PO CAPS: 100.0000 mg | ORAL_CAPSULE | Freq: Two times a day (BID) | ORAL | 0 refills | Status: DC

## 2020-08-09 MED ORDER — FUROSEMIDE 40 MG PO TABS
40.0000 mg | ORAL_TABLET | Freq: Every day | ORAL | Status: DC
  Administered 2020-08-10 – 2020-08-11 (×2): 40 mg via ORAL
  Filled 2020-08-09 (×2): qty 1

## 2020-08-09 MED ORDER — FOLIC ACID 1 MG PO TABS: 1.0000 mg | ORAL_TABLET | Freq: Every day | ORAL | 0 refills | Status: DC

## 2020-08-09 MED ORDER — FUROSEMIDE 20 MG PO TABS
20.0000 mg | ORAL_TABLET | Freq: Once | ORAL | Status: AC
  Administered 2020-08-09: 20 mg via ORAL
  Filled 2020-08-09: qty 1

## 2020-08-09 MED ORDER — GABAPENTIN 300 MG PO CAPS: 600.0000 mg | ORAL_CAPSULE | Freq: Three times a day (TID) | ORAL | 0 refills | Status: DC

## 2020-08-09 MED ORDER — OXYCODONE HCL 5 MG PO TABS
5.0000 mg | ORAL_TABLET | Freq: Two times a day (BID) | ORAL | 0 refills | Status: DC | PRN
Start: 2020-08-09 — End: 2020-08-16

## 2020-08-09 MED ORDER — OLANZAPINE 5 MG PO TABS: 5.0000 mg | ORAL_TABLET | Freq: Four times a day (QID) | ORAL | 0 refills | Status: DC | PRN

## 2020-08-09 MED ORDER — DIVALPROEX SODIUM 500 MG PO DR TAB
500.0000 mg | DELAYED_RELEASE_TABLET | Freq: Two times a day (BID) | ORAL | 0 refills | Status: DC
Start: 2020-08-09 — End: 2020-09-04

## 2020-08-09 MED ORDER — TRAZODONE HCL 50 MG PO TABS: 50.0000 mg | ORAL_TABLET | Freq: Every day | ORAL | 0 refills | Status: DC

## 2020-08-09 MED ORDER — MELATONIN 5 MG PO TABS
5.0000 mg | ORAL_TABLET | Freq: Every day | ORAL | 0 refills | Status: DC
Start: 2020-08-09 — End: 2020-10-12

## 2020-08-09 MED ORDER — CYANOCOBALAMIN 1000 MCG PO TABS: 1000.0000 ug | ORAL_TABLET | Freq: Every day | ORAL | 0 refills | Status: DC

## 2020-08-09 MED ORDER — SCOPOLAMINE 1 MG/3DAYS TD PT72: 1.0000 | MEDICATED_PATCH | TRANSDERMAL | 12 refills | Status: DC

## 2020-08-09 NOTE — Discharge Summary (Addendum)
Physician Discharge Summary  Patient ID: Nicholas Valdez MRN: [id number] DOB/AGE: [birth year] 39 y.o.  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  Planned percutaneous gastrostomy. EXAM: CT ABDOMEN AND PELVIS WITHOUT CONTRAST TECHNIQUE: Multidetector CT imaging of the abdomen and pelvis was performed following the standard protocol without IV contrast. COMPARISON:  07/26/2019 FINDINGS: Lower chest: Patchy bibasilar pulmonary infiltrates are present, more focal within the right middle lobe, possibly related to changes of atypical infection or aspiration. Mild bibasilar atelectasis. Cardiac size within normal limits. Hypoattenuation of the cardiac blood pool is in keeping with at least moderate anemia. Nasogastric tube extends into the mid body of the stomach. Hepatobiliary: The liver is mildly enlarged. No focal liver lesions are identified. No intra or extrahepatic biliary ductal dilation. Gallbladder unremarkable. Pancreas: Unremarkable Spleen: The spleen is mildly enlarged measuring 15.5 cm in greatest dimension. No intrasplenic lesions are identified. Adrenals/Urinary Tract: The adrenal glands are unremarkable. The kidneys are normal. Foley catheter balloon is seen within a decompressed bladder lumen. Stomach/Bowel: The stomach is normal in position and there is appropriate window of access identified for percutaneous  gastrostomy targeting the distal body of the stomach. There is no ascites. No free intraperitoneal gas. No intervening peritoneal mass identified. The small and large bowel are unremarkable and there is no evidence of obstruction or focal inflammation. The appendix is normal. Vascular/Lymphatic: No significant vascular findings are present. No enlarged abdominal or pelvic lymph nodes. Reproductive: Prostate is unremarkable. Other: Tiny fat containing umbilical hernia.  Rectum unremarkable. Musculoskeletal: No acute bone abnormality. No lytic or blastic bone lesion. IMPRESSION: Bibasilar pulmonary infiltrates, likely infectious or inflammatory and suggestive of atypical infection or aspiration. Mild hepatosplenomegaly. Nasogastric tube extends into the mid to distal body of the stomach. In this region, there is an appropriate tract for potential percutaneous access of the stomach. Electronically Signed   By: Helyn Numbers MD   On: 07/20/2020 02:13   CT HEAD WO CONTRAST  Result Date: 07/21/2020 CLINICAL DATA:  Altered mental status.  Confusion. EXAM: CT HEAD WITHOUT CONTRAST TECHNIQUE: Contiguous axial images were obtained from the base of the skull through the vertex without intravenous contrast. COMPARISON:  06/19/2020 FINDINGS: Brain: No sign of old or acute infarction, mass lesion, hemorrhage, hydrocephalus or extra-axial collection. Dilated perivascular space at the base of the brain on the right. Vascular: No abnormal vascular finding. Skull: No skull fracture. Sinuses/Orbits: Mucosal inflammatory changes of the maxillary, ethmoid and sphenoid sinuses. Orbits negative. Other: Bilateral mastoid effusions and fluid in the middle ears. IMPRESSION: 1. No intracranial abnormality. 2. Bilateral mastoid effusions and fluid in the middle ears. 3. Mucosal inflammatory changes of the paranasal sinuses. Electronically Signed   By: Paulina Fusi M.D.   On: 07/21/2020 15:07   CT CHEST WO CONTRAST  Result Date:  07/21/2020 CLINICAL DATA:  Respiratory failure. EXAM: CT CHEST WITHOUT CONTRAST TECHNIQUE: Multidetector CT imaging of the chest was performed following the standard protocol without IV contrast. Exam is significantly limited due to persistent respiratory motion artifact. COMPARISON:  July 26, 2019. FINDINGS: Cardiovascular: No significant vascular findings. Normal heart size. No pericardial effusion. Mediastinum/Nodes: Tracheostomy tube is in good position. Nasogastric tube is seen passing through esophagus into stomach. No significant adenopathy is noted. Thyroid gland is unremarkable. Lungs/Pleura: No pneumothorax or pleural effusion is noted. Multiple airspace opacities are noted in both upper lobes, right greater than left, concerning for multifocal pneumonia. Upper Abdomen: No acute abnormality. Musculoskeletal: No chest wall mass or suspicious bone lesions identified. IMPRESSION: 1. Multiple airspace opacities are noted in both upper lobes, right greater than left, concerning for multifocal pneumonia. 2. Tracheostomy and nasogastric tubes are in good position. Electronically Signed   By: Lupita Raider M.D.   On: 07/21/2020 16:35   DG Chest Port 1 View  Result Date: 07/26/2020 CLINICAL DATA:  40 year old male with respiratory failure. EXAM: PORTABLE CHEST 1 VIEW COMPARISON:  Chest radiograph dated 07/23/2020. FINDINGS: Tracheostomy above the carina. Right perihilar linear atelectasis. Developing infiltrate is not excluded. No pleural effusion pneumothorax. Stable cardiac silhouette. Atherosclerotic calcification of the aorta. IMPRESSION: Right perihilar linear atelectasis. Developing infiltrate is not excluded. Electronically Signed   By: Elgie Collard M.D.   On: 07/26/2020 21:12   DG Chest Port 1 View  Result Date: 07/23/2020 CLINICAL DATA:  Acute respiratory failure EXAM: PORTABLE CHEST 1 VIEW COMPARISON:  July 18, 2020 FINDINGS: The tracheostomy tube is in good position. The NG tube  terminates below today's film. No pneumothorax. The cardiomediastinal silhouette is stable. No overt edema. No nodules or masses. No focal infiltrates. IMPRESSION: 1. Support apparatus as above. 2. No acute abnormalities are identified. Electronically Signed   By: Gerome Sam III M.D   On: 07/23/2020 12:54   DG Chest Port 1 View  Result Date: 07/18/2020 CLINICAL DATA:  Altered mental status.  Respiratory distress. EXAM: PORTABLE CHEST 1 VIEW COMPARISON:  Chest x-ray 07/15/2020. FINDINGS: Tracheostomy tube and NG tube in stable position. Heart size stable. Low lung volumes. Persistent atelectasis/infiltrate in the left upper lung and perihilar region, improved however from prior exam. No pleural effusion or pneumothorax. IMPRESSION: 1. Tracheostomy tube and NG tube in stable position. 2. Low lung volumes. Persistent atelectasis/infiltrate in the left upper lung and perihilar region. Interim partial clearing from prior exam. Electronically Signed   By: Maisie Fus  Register   On: 07/18/2020 13:04   DG Chest Port 1 View  Result Date: 07/15/2020 CLINICAL DATA:  Respiratory failure EXAM: PORTABLE CHEST 1 VIEW COMPARISON:  Radiograph 07/10/2019 FINDINGS: Placement tracheostomy tube in good position. Normal cardiac silhouette. There is streaky densities in the LEFT upper lobe new from prior. No focal consolidation. No pneumothorax. IMPRESSION: 1. Tracheostomy tube in good position. 2. Streaky densities in the LEFT upper lobe representing atelectasis versus infiltrate. Electronically Signed   By: Genevive Bi M.D.   On: 07/15/2020 07:05    Labs:  Basic Metabolic Panel: Recent Labs  Lab 08/05/20 0651 08/07/20 0716  NA  --  139  K  --  4.0  CL  --  101  CO2  --  27  GLUCOSE  --  109*  BUN  --  6  CREATININE 0.75 0.83  CALCIUM  --  8.8*    CBC: Recent Labs  Lab 08/05/20 0651 08/07/20 0716  WBC 7.5 5.9  NEUTROABS  --  2.9  HGB 9.4* 9.8*  HCT 29.5* 29.7*  MCV 100.7* 100.3*  PLT 321 321     CBG: Recent Labs  Lab 08/04/20 2344 08/05/20 1436 08/06/20 0011 08/06/20 0450  08/10/20 0831  GLUCAP 121* 124* 141* 123* 106*   Family history.  Positive hypertension.  Denies any colon cancer esophageal cancer or rectal cancer  Brief HPI:   FILIBERTO WAMBLE is a 40 y.o. right-handed male with history significant for obesity with BMI 44.98 hepatitis C as well as alcohol polysubstance tobacco abuse.  Patient lives with parents and 40 year old daughter independent prior to admission.  1 level home 2 steps to entry independent at baseline.  Presented 06/19/2020 to [facility] for inpatient detox but found to be confused with nausea was sent to the emergency department.  He was seen by the emergency department approximate 2 weeks prior for alcohol withdrawal and started on Librium protocol.  Cranial CT unremarkable for acute process admission chemistries glucose 137 calcium 7.8 hemoglobin 13.3 alcohol 15 ammonia 76 lactic acid 3.3 CK 138.  He was emergently intubated for airway protection.  Patient with prolonged ICU course he continued to have alcohol withdrawal agitation requiring Precedex drip.  Required tracheostomy 07/10/2020 per Dr. Bud Face with a #8 cuffed Shiley placed as well as placement of gastrostomy tube for nutritional support 07/22/2020 per Dr. Sterling Big.  Tracheal aspirate Elizabethkingia Meningoseptica and placed on broad-spectrum antibiotics completed 08/01/2020.  No current plan to downsize tracheostomy remains with #8 Shiley as well as speech therapy working with Passy-Muir valve.  Patient required sitters for agitation restlessness remained on Seroquel Zyprexa as well as valproic acid.  He was tolerating mechanical soft diet.  Maintained on Lovenox for DVT prophylaxis.  Hospital course complicated by acute on chronic anemia hemoglobin 9.9 and monitored.  Due to patient decreased functional ability necessity for assistive device ADLs he was admitted for a comprehensive rehab  program.   Hospital Course: VANG KRAEGER was admitted to rehab 08/04/2020 for inpatient therapies to consist of PT, ST and OT at least three hours five days a week. Past admission physiatrist, therapy team and rehab RN have worked together to provide customized collaborative inpatient rehab.  Pertaining to patient's acute encephalopathy due to alcohol withdrawal.  He was attending therapies.  Maintained on Lovenox for DVT prophylaxis.  Pain management with the use of Duragesic patch, scheduled Neurontin 600 mg every 8 hours, Lidoderm patch and oxycodone for breakthrough pain.  Mood stabilization with scheduled Seroquel 100 mg twice daily, Depakote 500 mg every 12 hours, Zyprexa via 5 mg 4 times daily as needed agitation as well as Ativan as needed.  He was using trazodone for sleep.  Patient initially was in need of enclosure bed for safety.  In regards to patient's respiratory failure tracheostomy 07/10/2020 per Dr. Bud Face currently with a #8 cuffed Shiley no plan to downsize as documented follow-up outpatient.  He did have a gastrostomy tube placed 07/22/2020 his diet was advanced to mechanical soft he would need PEG tube release for the next 6 weeks can follow-up with Dr. Sterling Big for removal.  Noted obesity 44.98 dietary follow-up.  Patient had a long history of tobacco alcohol polysubstance use receiving counseling he would need outpatient ongoing care.  He did receive followed by wound care nurse for left foot wound with conservative care advised to wash left heel with soap and water pat dry apply mupiricon ointment to wound cover with foam dressing change daily as needed   Blood pressures were monitored on TID basis and controlled     Rehab course: During patient's stay in rehab weekly team conferences were held to monitor patient's progress, set goals and discuss barriers to  discharge. At admission, patient required minimal guard 200 feet without assistive device minimal guard sit  to stand.  Minimal assist upper body dressing mod assist lower body dressing set up upper body dressing minimal assist lower body dressing  Physical exam.  Blood pressure 148/87 pulse 102 temperature 98.6 respirations 22 oxygen saturation 99% room air Constitutional.  No acute distress HEENT Head.  Normocephalic and atraumatic Eyes.  Pupils round and reactive to light no discharge without nystagmus Neck.  Supple nontender no JVD without thyromegaly Cardiac regular rate rhythm without extra sounds or murmur heard Abdomen.  Soft nontender positive bowel sounds without rebound Respiratory effort normal no respiratory distress without wheeze Skin.  Left foot wound Neurologic.  Alert follows commands he does display some decrease in awareness Motor.  4/5 throughout   He/She  has had improvement in activity tolerance, balance, postural control as well as ability to compensate for deficits. He/She has had improvement in functional use RUE/LUE  and RLE/LLE as well as improvement in awareness.  He needed some encouragement to participate.  Wheelchair mobility supervision.  Sessions focused on functional mobility transfers generalized strengthening dynamic standing balance coordination ambulation and simulated car transfers as well as stair negotiation.  He ambulates with contact-guard assist.  Patient completed bathing at sit to stand level with cues for recall sequencing to ensure thoroughness with bathing.  He could put on his underclothes and shirt with supervision.  Speech therapy follow-up facilitated sessions with overall minimal verbal cues and visual cueing for air awareness and problem solving strategies during a basic money management task using coins in cash.  Patient with most difficulty problem-solving subtracting calculating change however other areas also made when totaling and displaying set amounts.  Patient completed basic medication management task in which she interpreted medication labels  and organized.  Full teaching completed plan discharge to home       Disposition: Discharge to home    Diet: Mechanical soft  Special Instructions: No driving smoking or alcohol  Wound care.  Wash left heel with soap and water pat dry apply mupiricon ointment to wound.  Cover with foam dressing daily as needed  Routine tracheostomy care.  Follow-up Dr. Bud Face in regards to maintenance of tracheostomy [phone]  Gastrostomy tube should continue to remain in place for total 6 weeks after initially being placed 07/22/2020 and follow-up with Dr. Sterling Big [phone]  Medications at discharge 1.  Tylenol as needed 2.  Depakote 500 mg every 12 hours 3.  Colace 100 mg twice daily 4.  Duragesic patch change as directed 5.  Folic acid 1 mg daily 6.  Lasix 20 mg daily 7.  Neurontin 600 mg p.o. every 8 hours 8.  Lidoderm patch change as directed 9.  Ativan 0.5 mg every 4 hours as needed anxiety 10.  Magnesium oxide 400 mg p.o. twice daily 11.  Melatonin 5 mg p.o. nightly 12.  Multivitamin daily 13.  Zyprexa 5 mg 4 times daily as needed agitation 14.  Oxycodone 5 mg every 12 hours as needed severe pain 15.  MiraLAX daily hold for loose stools 16.  Protonix 40 mg daily 17.  Seroquel 100 mg p.o. twice daily 18.  Scopolamine patch change every 72 hours 19.  Trazodone 50 mg nightly 20.  Vitamin B12 2000 mcg p.o. daily  30-35 minutes were spent completing discharge summary and discharge planning      Follow-up Information     Bender, Earl Lagos, MD Follow up.   Specialty: Family  Medicine Contact information: [place] [address] [phone]         Jamse Arn, MD Follow up.   Specialty: Physical Medicine and Rehabilitation Why: No follow-up needed Contact information: [place] [address] [phone]         Carloyn Manner, MD Follow up.   Specialty: Otolaryngology Why: Call for  appointment Contact information: [place] [address] [phone]         Jules Husbands, MD Follow up.   Specialty: General Surgery Why: Call for appointment Contact information: [address] [place] [address] [phone]                 Signed: Cathlyn Parsons 08/11/2020, 5:29 AM Patient was seen, face-face, and physical exam performed by me on day of discharge, greater than 30 minutes of total time spent.. Please see progress note from day of discharge as well.  Delice Lesch, MD, ABPMR

## 2020-08-09 NOTE — Progress Notes (Signed)
Occupational Therapy Session Note  Patient Details  Name: Nicholas Valdez MRN: [id number] Date of Birth: 15-Mar-1981  Today's Date: 08/09/2020 OT Individual Time: 1400-1453 OT Individual Time Calculation (min): 53 min  and Today's Date: 08/09/2020 OT Missed Time: 22 Minutes Missed Time Reason: Patient fatigue   Short Term Goals: Week 1:  OT Short Term Goal 1 (Week 1): STGs=LTGs due to ELOS  Skilled Therapeutic Interventions/Progress Updates:    Treatment session with focus on higher level cognitive task with focus on working memory and "chunking" to increase attention and success.  Pt received supine in bed asleep.  Per RN, pt has been fatigued throughout day and to allow him to rest.  Therapist returned to check on pt and noted that he was on the phone.  Pt agreeable to therapy session, reporting that he wanted to work on meal planning to take more initiative with caring for himself and his daughter.  Pt very verbose throughout session requiring redirection to task at hand and often providing question cues for redirection. Pt overwhelmed by wanting to prepare expensive and rare meals while on limited income.  Therapist educated on small modifications to provide healthy alternatives while not spending too much or changing things drastically (with teenage daughter).  Therapist recommended focusing on one meal at a time vs one day at a time to increase success and maintain sustained attention.  Pt very distracted by multiple papers and often crossing out already written items for no apparent reason.  At one point, pt became overwhelmed and visibly and verbally frustrated requiring redirection.  Pt reports mental fatigue and requested to return to supine to "rest a bit".  Session terminated due to fatigue.  Pt left supine in bed with all needs in reach.  Therapy Documentation Precautions:  Precautions Precautions: Fall,Other (comment) Precaution Comments: capped trach monitor SpO2 and  HR Restrictions Weight Bearing Restrictions: No General: General OT Amount of Missed Time: 22 Minutes Vital Signs: Therapy Vitals Temp: 98.2 F (36.8 C) Temp Source: Oral Pulse Rate: 96 Resp: 16 BP: 120/80 Patient Position (if appropriate): Lying Oxygen Therapy SpO2: 96 % O2 Device: Room Air Pain:  Pt with no c/o pain   Therapy/Group: Individual Therapy  Rosalio Loud 08/09/2020, 3:30 PM

## 2020-08-09 NOTE — Progress Notes (Signed)
Physical Therapy Session Note  Patient Details  Name: Nicholas Valdez MRN: [id number] Date of Birth: 1981/03/29  Today's Date: 08/09/2020 PT Individual Time: 4259-5638 PT Individual Time Calculation (min): 72 min   Short Term Goals: Week 1:  PT Short Term Goal 1 (Week 1): Pt will ambulate at least 138ft using LRAD with CGA PT Short Term Goal 2 (Week 1): Pt will ascend/descend 4 steps using HRs per home set-up with CGA PT Short Term Goal 3 (Week 1): Pt will demonstrate increased safety awareness during functional mobility via initiating a seated rest break when noticed labored breathing with only min cues 50% of the time  Skilled Therapeutic Interventions/Progress Updates:   Received pt standing at sink brushing teeth with NT providing supervision. PT took over with care. Pt agreeable to therapy and reported 8/10 pain in L foot and 4/10 "all over the rest of my body." RN present to administer medications. Repositioning, rest brakes, and distraction done to reduce pain levels. Pt verbose throughout session requiring increased time for tasks but able to be re-directed. Pt reported his dad is coming in for family education but is unsure as to when. Session with emphasis on discharge planning, functional mobility/transfers, generalized strengthening, dynamic standing balance/coordination, ambulation, and improved activity tolerance. Pt declined ambulating to dayroom and requested therapist push him. Pt transported to dayroom in [facility] total A and engaged in problem solving, sequencing, and coordination playing wii tennis. Pt with increased difficulty sequencing movements to play wii tennis despite cues and with increased frustration and agitation. Pt stated "I hate the wii, it's pissing me off" and requested to perform another activity. Worked on dynamic standing balance playing cornhole without AD and close supervision x 2 trials. Pt walked over and picked up beanbags from floor with supervision with no LOB  noted. O2 sat 95% and HR 124bpm after activity. Pt perseverating on being suctioned throughout session despite RN encouraging pt to try to clear secretions independently by coughing. Pt stated "well I think I need to go back to my room and lay down" and ambulated 179ft without AD and supervision back to room and requested to write down recipe for therapist that he mentioned earlier in session. Pt sat EOB and wrote down steps for recipe from memory with min cues for word finding for objects. Therapist provided pt with HEP consisting of the following exercises and educated pt on frequency/duration/technique: -Supine Bridge - 1 x daily - 7 x weekly - 2 sets - 12 reps -Mini Squat with Counter Support - 1 x daily - 7 x weekly - 3 sets - 10 reps -Standing March with Counter Support - 1 x daily - 7 x weekly - 3 sets - 10 reps -Heel rises with counter support - 1 x daily - 7 x weekly - 3 sets - 10 reps -Standing Hip Abduction with Counter Support - 1 x daily - 7 x weekly - 3 sets - 10 reps -Standing Knee Flexion - 1 x daily - 7 x weekly - 3 sets - 10 reps Pt declined practicing any of the exercises, requesting to rest in bed and be suctioned but did ask questions about technique for a few exercises. Concluded session with pt supine in bed, needs within reach, and bed alarm on. Therapist provided pt with ginger ale. RN notified of pt's request to be suctioned.   Therapy Documentation Precautions:  Precautions Precautions: Fall,Other (comment) Precaution Comments: capped trach monitor SpO2 and HR, PEG, decreased safety awareness with impulsivity and  restlessness Restrictions Weight Bearing Restrictions: No   Therapy/Group: Individual Therapy Rushton Early PT, DPT   08/09/2020, 7:27 AM

## 2020-08-09 NOTE — Patient Care Conference (Signed)
Inpatient RehabilitationTeam Conference and Plan of Care Update Date: 08/09/2020   Time: 11:44 AM    Patient Name: Nicholas Valdez      Medical Record Number: [id number]  Date of Birth: [birth year] Sex: Male         Room/Bed: 4W06C/4W06C-01 Payor Info: Payor: MEDICAID [place] / Plan: MEDICAID OF [place] / Product Type: *No Product type* /    Admit Date/Time:  08/04/2020  2:06 PM  Primary Diagnosis:  Acute metabolic encephalopathy  Hospital Problems: Principal Problem:   Acute metabolic encephalopathy Active Problems:   Peripheral edema   Impulsive   Left foot pain    Expected Discharge Date: Expected Discharge Date: 08/11/20  Team Members Present: Physician leading conference: Dr. Maryla Morrow Care Coodinator Present: Chana Bode, RN, BSN, CRRN;Becky Dupree, LCSW Nurse Present: Other (comment) Lupita Dawn, RN) PT Present: Raechel Chute, PT OT Present: Rosalio Loud, OT SLP Present: Feliberto Gottron, SLP PPS Coordinator present : Fae Pippin, [place], PT     Current Status/Progress Goal Weekly Team Focus  Bowel/Bladder   Pt continent of B/B. LBM 08/08/2020  Regular BMs every 3 days or less  Assess B/B every shift PRN   Swallow/Nutrition/ Hydration   Regular textures with thin liquids, no straws, Mod I  Mod I  Family Education   ADL's   CGA - supervision for ambulatory transfers, Supervision bathing and dressing with cues for recall and sequencing  Supervision  working memory, dynamic standing balance, endurance, functional transfers, pt/family education   Mobility   bed mobility supervision, transfers CGA/supervision, gait 137ft without AD CGA  supervision  functional mobility/transfers, generalized strengthening, dynamic standing balance/coordination, ambulation, safety awareness, problem solving, and endurance.   Communication             Safety/Cognition/ Behavioral Observations  Mod-Max A  Min A  Family Education   Pain   Pt rates pain 7/10 from left heel.  Requiring PRN oxycodone  Pain rating of <4/10  Assess pain every shift and PRN   Skin   Left heel pressure injury  No new skin breakdown  Assess skin every shift and PRN     Discharge Planning:  Home with MOm who is sick at this time, will need much family education with Dad and brother regarding trach care and will ned 24/7 supervision at discharge due to memory and safety issues   Team Discussion: Pedal edema treated per MD with lasix. Patient is impulsive, berbous, eaily distracted (internally and externally), pain issues, memory issues and safety. DTI on heel with foam dressing. Patient on target to meet rehab goals: yes, CGA- supervision overall with supervision goals  *See Care Plan and progress notes for long and short-term goals.   Revisions to Treatment Plan:   Teaching Needs: Trach care, skin care memory and safety awareness, cues for sequencing Proactive steps to manage self care  Current Barriers to Discharge: Decreased caregiver support, Janina Mayo, Wound care and Behavior  Lack of insurance for home health services  Possible Resolutions to Barriers: Family education Trach care/emergency respiratory care set up for 08/11/20 @ 1030 with respiratory care staff    Medical Summary Current Status: Acute hypoxic respiratory failure secondary to acute encephalopathy due to alcohol withdrawal  Barriers to Discharge: Behavior;Weight;Trach;Medical stability  Barriers to Discharge Comments: Insurance coverage for therapies Possible Resolutions to [employer] Focus: Therapies, diuretics, follow labs - Hb   Continued Need for Acute Rehabilitation Level of Care: The patient requires daily medical management by a physician with specialized training  in physical medicine and rehabilitation for the following reasons: Direction of a multidisciplinary physical rehabilitation program to maximize functional independence : Yes Medical management of patient stability for increased activity  during participation in an intensive rehabilitation regime.: Yes Analysis of laboratory values and/or radiology reports with any subsequent need for medication adjustment and/or medical intervention. : Yes   I attest that I was present, lead the team conference, and concur with the assessment and plan of the team.   Chana Bode B 08/09/2020, 3:06 PM

## 2020-08-09 NOTE — Progress Notes (Signed)
Pt setting phone alarm for his PRN medications.

## 2020-08-09 NOTE — Progress Notes (Addendum)
Patient ID: Nicholas Valdez, male   DOB: 12-02-1980, 40 y.o.   MRN: [id number] Dad works from 5a-5p one week on and one week of hecan not get off tomorrow but can get off Friday will schedule therapies at 9:00 and cover all therpapies and trach care. Made aware after education on Friday he would be discharged home  1:00 PM Met with pt to make aware of team conference goals supervision level and discharge 1/7 once family education completed with Dad. Dad to be here at 9:00 am on Friday and aware will be here until 1:00. Pt pleased with discharge date.

## 2020-08-09 NOTE — Progress Notes (Signed)
Speech Language Pathology Daily Session Note  Patient Details  Name: Nicholas Valdez MRN: [id number] Date of Birth: Sep 06, 1980  Today's Date: 08/09/2020 SLP Individual Time: 0800-0830 SLP Individual Time Calculation (min): 30 min  Short Term Goals: Week 1: SLP Short Term Goal 1 (Week 1): Pt will increase sustained attention to 5 minutes with mod A SLP Short Term Goal 2 (Week 1): Pt will increase simple problem solving for current environment with mod A SLP Short Term Goal 3 (Week 1): Pt will increase functional recall with use of compensatory memory strategies and visual aids with mod A SLP Short Term Goal 4 (Week 1): Pt will consume trials regular solids and thin liquid via straw with min A for use of compensatory strategies  Skilled Therapeutic Interventions: Pt seen for skilled ST targeting cognitive skills, reports he didn't sleep well last night and is fatigued this AM. Pt perseverating on wanting a banana with his medication throughout. Pt able to recall events from previous ST sessions with min A. Working memory task requiring mod A verbal cues to increase accuracy d/t patient's poor sustained attention which averages <10 seconds. Pt provided ongoing education re: current cognitive impairment and compensatory strategies for home environment. Pt left sitting EOB utilizing cell phone with alarm set and all needs met. Cont ST POC.   Pain Pain Assessment Pain Scale: 0-10 Pain Score: 0-No pain  Therapy/Group: Individual Therapy  Dewaine Conger 08/09/2020, 8:56 AM

## 2020-08-09 NOTE — Progress Notes (Addendum)
[facility] PHYSICAL MEDICINE & REHABILITATION PROGRESS NOTE  Subjective/Complaints: Patient seen sitting up at edge of bed this morning.  He states he slept well overnight.  He is working with therapies.  He is questions regarding discharge education.  Discussed with patient and therapies.  ROS: Denies CP, SOB, N/V/D  Objective: Vital Signs: Blood pressure (!) 100/58, pulse 92, temperature 98.7 F (37.1 C), temperature source Oral, resp. rate 16, height 5\' 10"  (1.778 m), weight (!) 142 kg, SpO2 100 %. No results found. Recent Labs    08/07/20 0716  WBC 5.9  HGB 9.8*  HCT 29.7*  PLT 321   Recent Labs    08/07/20 0716  NA 139  K 4.0  CL 101  CO2 27  GLUCOSE 109*  BUN 6  CREATININE 0.83  CALCIUM 8.8*    Intake/Output Summary (Last 24 hours) at 08/09/2020 1040 Last data filed at 08/09/2020 0900 Gross per 24 hour  Intake 540 ml  Output --  Net 540 ml     Pressure Injury 07/26/20 Heel Left;Lateral Deep Tissue Pressure Injury - Purple or maroon localized area of discolored intact skin or blood-filled blister due to damage of underlying soft tissue from pressure and/or shear. purple and pink blistered area  (Active)  07/26/20 0730  Location: Heel  Location Orientation: Left;Lateral  Staging: Deep Tissue Pressure Injury - Purple or maroon localized area of discolored intact skin or blood-filled blister due to damage of underlying soft tissue from pressure and/or shear.  Wound Description (Comments): purple and pink blistered area on L  lateral heel  Present on Admission: -- (possible mistake on chart, maybe prior R heel documented by mistake)    Physical Exam: BP (!) 100/58 (BP Location: Right Arm)   Pulse 92   Temp 98.7 F (37.1 C) (Oral) Comment: rechecked VS after respiratory caused a MEWs  Resp 16   Ht 5\' 10"  (1.778 m)   Wt (!) 142 kg   SpO2 100%   BMI 44.92 kg/m   Constitutional: No distress . Vital signs reviewed. HENT: Normocephalic.  Atraumatic. Neck: +  Trach. Eyes: EOMI. No discharge. Cardiovascular: No JVD.  RRR. Respiratory: Normal effort.  No stridor.  Bilateral clear to auscultation. GI: Non-distended.  BS +. Skin: Warm and dry.  DTI to left lateral heel Psych: Normal behavior.  Confused. Musc: Lower extremity edema.  No tenderness in extremities. Neuro: Alert Motor: 4-4+/5 throughout, stable  Assessment/Plan: 1. Functional deficits which require 3+ hours per day of interdisciplinary therapy in a comprehensive inpatient rehab setting.  Physiatrist is providing close team supervision and 24 hour management of active medical problems listed below.  Physiatrist and rehab team continue to assess barriers to discharge/monitor patient progress toward functional and medical goals   Care Tool:  Bathing    Body parts bathed by patient: Right arm,Left arm,Chest,Abdomen,Front perineal area,Buttocks,Right upper leg,Left upper leg,Right lower leg,Left lower leg,Face         Bathing assist Assist Level: Supervision/Verbal cueing     Upper Body Dressing/Undressing Upper body dressing   What is the patient wearing?: Pull over shirt    Upper body assist Assist Level: Supervision/Verbal cueing    Lower Body Dressing/Undressing Lower body dressing      What is the patient wearing?: Underwear/pull up,Pants     Lower body assist Assist for lower body dressing: Supervision/Verbal cueing     Toileting Toileting    Toileting assist Assist for toileting: Independent     Transfers Chair/bed transfer  Transfers  assist  Chair/bed transfer activity did not occur: Safety/medical concerns  Chair/bed transfer assist level: Supervision/Verbal cueing     Locomotion Ambulation   Ambulation assist   Ambulation activity did not occur: Safety/medical concerns  Assist level: Supervision/Verbal cueing Assistive device: No Device Max distance: 139ft   Walk 10 feet activity   Assist  Walk 10 feet activity did not occur:  Safety/medical concerns  Assist level: Supervision/Verbal cueing Assistive device: No Device   Walk 50 feet activity   Assist Walk 50 feet with 2 turns activity did not occur: Safety/medical concerns  Assist level: Contact Guard/Touching assist Assistive device: No Device    Walk 150 feet activity   Assist Walk 150 feet activity did not occur: Safety/medical concerns  Assist level: Contact Guard/Touching assist Assistive device: No Device    Walk 10 feet on uneven surface  activity   Assist Walk 10 feet on uneven surfaces activity did not occur: Safety/medical concerns   Assist level: Supervision/Verbal cueing     Wheelchair     Assist Will patient use wheelchair at discharge?: No Type of Wheelchair: Manual    Wheelchair assist level: Supervision/Verbal cueing Max wheelchair distance: 111ft    Wheelchair 50 feet with 2 turns activity    Assist        Assist Level: Supervision/Verbal cueing   Wheelchair 150 feet activity     Assist      Assist Level: Supervision/Verbal cueing    Medical Problem List and Plan: 1.  Acute hypoxic respiratory failure secondary to acute encephalopathy due to alcohol withdrawal  Continue CIR, needs patient and family edu  Team conference today to discuss current and goals and coordination of care, home and environmental barriers, and discharge planning with nursing, case manager, and therapies. Please see conference note from today as well.  2.  Antithrombotics: -DVT/anticoagulation: Lovenox             -antiplatelet therapy: N/A 3. Pain Management: Neurontin 600 mg every 8 hours, Duragesic patch  Oxycodone as needed decreased to every 12 as needed in anticipation of discharge soon             Lidoderm patch ordered for left foot on 1/4  Oragel ordered for dental pain  Controlled with interventions on 1/5  Monitor with increased exertion 4. Mood: Valproic acid 500 mg twice daily             -antipsychotic  agents: Seroquel 100 mg twice daily Zyprexa 5 mg 4 times daily as needed, Haldol 1 mg every 6 as needed  He feels Ativan is helping, continue for now, does sedate him so nursing has been giving after therapy sessions 5. Neuropsych: This patient is not capable of making decisions on his own behalf.  Continue telesitter for safety for impulsivity 6. Skin/Wound Care: Routine skin checks 7. Fluids/Electrolytes/Nutrition: Routine in and outs 8.  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 Bud Face.  Currently with #8 cuffed Shiley with no plan to downsize and follow-up outpatient             Duoneb PRN 9.  Gastrostomy tube 07/22/2020 per Dr. Sterling Big.  Patient currently on mechanical soft thin liquid diet. 10.  Obesity.  BMI 44.98.  Dietary follow-up.  Encourage weight loss 11.  History of tobacco/alcohol polysubstance abuse.  Counsel when appropriate. 12.  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               Hemoglobin 9.8 on 1/3  Vitamin B12  severely low, supplement initiated  Continue to monitor 13.  Constipation.  MiraLAX daily 14.  Sleep disturbance             Melatonin 5 mg nightly  ECG reviewed borderline prolonged QTC, trazodone 50 started on 1/1  Continue sleep chart  Improving 15.  Peripheral edema  Lasix 20 daily started on 1/1, increased on 1/5  Improving   LOS: 5 days A FACE TO FACE EVALUATION WAS PERFORMED  Tonishia Steffy Karis Juba 08/09/2020, 10:40 AM

## 2020-08-09 NOTE — Progress Notes (Signed)
Patient was resting peacefully this AM.  Patient did request pain medication and Ativan with the morning medication pass.  Patient given pain medication and Ativan.  Patient was coughing and RN educated him on coughing big and getting sputum up to spit out.  Patient resisted and wanted to be suctioned.  RN got a yaunker and attached to suction on the wall and educated patient on deep breath and cough and when sputum comes up the suction out of mouth.  Yaunker left at bedside for patient to use as needed.  Patient did not ask for Ativan during the shift, but was trying to ask for but could not remember the name of the drug.  RN passed evening medication and patient did not ask for anything extra.

## 2020-08-09 NOTE — Progress Notes (Signed)
Physical Therapy Discharge Summary  Patient Details  Name: Nicholas Valdez MRN: [id number] Date of Birth: Nov 02, 1980  Patient has met 9 of 9 long term goals due to improved activity tolerance, improved balance, improved postural control and improved coordination.  Patient to discharge at an ambulatory level Supervision. Pt's family did not attend family education training for PT as pt requires increased need for family education with SLP, however extensive discussion had with pt regarding education for need for 24/7 supervision due to cognitive deficits, safety awareness, and impulsivity. Pt verbalized understanding and in agreement.   All goals met  Recommendation:  Patient will benefit from ongoing skilled PT services in outpatient setting to continue to advance safe functional mobility, address ongoing impairments in generalized strengthening, dynamic standing balance/coordination, gait training, endurance, and to minimize fall risk.  Equipment: No equipment provided  Reasons for discharge: treatment goals met  Patient/family agrees with progress made and goals achieved: Yes  PT Discharge Precautions/Restrictions Precautions Precautions: Fall;Other (comment) Precaution Comments: capped trach monitor SpO2 and HR Restrictions Weight Bearing Restrictions: No Cognition Overall Cognitive Status: Impaired/Different from baseline Arousal/Alertness: Awake/alert Orientation Level: Oriented to person;Oriented to place;Oriented to situation Memory: Impaired Awareness: Impaired Problem Solving: Impaired Behaviors: Restless;Impulsive Safety/Judgment: Impaired Sensation Sensation Light Touch: Appears Intact Proprioception: Appears Intact Coordination Gross Motor Movements are Fluid and Coordinated: No Fine Motor Movements are Fluid and Coordinated: No Coordination and Movement Description: mild uncoordination due to generalized weakness, poor endurance, and decreased  awareness Finger Nose Finger Test: mild dysmetria on LUE Heel Shin Test: decreased ROM and uncoordinated Motor  Motor Motor: Abnormal postural alignment and control Motor - Skilled Clinical Observations: mild uncoordination due to generalized weakness, poor endurance, and decreased awareness  Mobility Bed Mobility Bed Mobility: Rolling Right;Rolling Left;Sit to Supine;Supine to Sit Rolling Right: Independent with assistive device Rolling Left: Independent with assistive device Supine to Sit: Independent with assistive device Sit to Supine: Independent with assistive device Transfers Transfers: Sit to Stand;Stand to Sit;Stand Pivot Transfers Sit to Stand: Supervision/Verbal cueing Stand to Sit: Supervision/Verbal cueing Stand Pivot Transfers: Supervision/Verbal cueing Stand Pivot Transfer Details: Verbal cues for technique;Verbal cues for precautions/safety Stand Pivot Transfer Details (indicate cue type and reason): occasional verbal cues to avoid obstacles in front of pt Transfer (Assistive device): None Locomotion  Gait Gait Assistance: Supervision/Verbal cueing Gait Distance (Feet): 200 Feet Assistive device: None Gait Assistance Details: Verbal cues for precautions/safety;Verbal cues for technique Gait Assistance Details: verbal cues for energy conservation and to avoid distractions Gait Gait: Yes Gait Pattern: Impaired Gait Pattern: Wide base of support;Decreased step length - left;Decreased step length - right;Decreased stride length;Decreased trunk rotation;Poor foot clearance - left;Poor foot clearance - right Gait velocity: slight decrease Stairs / Additional Locomotion Stairs: Yes Stairs Assistance: Supervision/Verbal cueing Stair Management Technique: Two rails Number of Stairs: 12 Height of Stairs: 6 Ramp: Supervision/Verbal cueing Curb: Supervision/Verbal cueing (1 UE support) Wheelchair Mobility Wheelchair Mobility: Yes Wheelchair Assistance:  Chartered loss adjuster: Both upper extremities Wheelchair Parts Management: Needs assistance Distance: 139f  Trunk/Postural Assessment  Cervical Assessment Cervical Assessment: Exceptions to [facility](forward head) Thoracic Assessment Thoracic Assessment: Exceptions to [facility] shoulders) Lumbar Assessment Lumbar Assessment: Exceptions to [facility](posterior pelvic tilt) Postural Control Postural Control: Deficits on evaluation  Balance Balance Balance Assessed: Yes Static Sitting Balance Static Sitting - Balance Support: Feet supported;No upper extremity supported Static Sitting - Level of Assistance: 7: Independent Dynamic Sitting Balance Dynamic Sitting - Balance Support: Feet supported;No upper extremity supported Dynamic Sitting -  Level of Assistance: 7: Independent Static Standing Balance Static Standing - Balance Support: No upper extremity supported Static Standing - Level of Assistance: 5: Stand by assistance (supervision) Dynamic Standing Balance Dynamic Standing - Balance Support: No upper extremity supported Dynamic Standing - Level of Assistance: 5: Stand by assistance (supervision) Extremity Assessment  RLE Assessment RLE Assessment: Exceptions to [facility] General Strength Comments: grossly 4+/5; pain with DF LLE Assessment LLE Assessment: Exceptions to [facility] General Strength Comments: grossly 4+/5  Alfonse Alpers PT, DPT  08/09/2020, 7:41 AM

## 2020-08-09 NOTE — Progress Notes (Signed)
Pt woke up from a deep sleep for his set of VS and is asking "can I have my meds?" When this nurse asked what meds pt states "Ativan or Oxycodone. Whatever I can have" Pt is instructed on what time he can have his PRN meds. He asks for Ativan, then pt falls asleep. Pt then calls again 10 mins later telling this nurse to hurry up because he is "antsy". Ativan pulled and when take to room pt is again asleep. This nurse returns meds as pt does not need ativan if he is asleep.

## 2020-08-10 ENCOUNTER — Inpatient Hospital Stay (HOSPITAL_COMMUNITY): Payer: Medicaid Other | Admitting: Speech Pathology

## 2020-08-10 ENCOUNTER — Inpatient Hospital Stay (HOSPITAL_COMMUNITY): Payer: Medicaid Other

## 2020-08-10 DIAGNOSIS — R609 Edema, unspecified: Secondary | ICD-10-CM | POA: Diagnosis not present

## 2020-08-10 DIAGNOSIS — E46 Unspecified protein-calorie malnutrition: Secondary | ICD-10-CM

## 2020-08-10 DIAGNOSIS — E8809 Other disorders of plasma-protein metabolism, not elsewhere classified: Secondary | ICD-10-CM | POA: Diagnosis not present

## 2020-08-10 DIAGNOSIS — G9341 Metabolic encephalopathy: Secondary | ICD-10-CM | POA: Diagnosis not present

## 2020-08-10 DIAGNOSIS — R4587 Impulsiveness: Secondary | ICD-10-CM | POA: Diagnosis not present

## 2020-08-10 LAB — GLUCOSE, CAPILLARY: Glucose-Capillary: 106 mg/dL — ABNORMAL HIGH (ref 70–99)

## 2020-08-10 NOTE — Progress Notes (Signed)
[place] PHYSICAL MEDICINE & REHABILITATION PROGRESS NOTE  Subjective/Complaints: Patient seen laying in bed this morning and later sitting up.  He states he slept well overnight, confirmed with sleep chart.  He appears more calm.  ROS: Denies CP, SOB, N/V/D  Objective: Vital Signs: Blood pressure 114/76, pulse (!) 107, temperature 98.6 F (37 C), resp. rate 18, height 5\' 10"  (1.778 m), weight (!) 142 kg, SpO2 98 %. No results found. No results for input(s): WBC, HGB, HCT, PLT in the last 72 hours. No results for input(s): NA, K, CL, CO2, GLUCOSE, BUN, CREATININE, CALCIUM in the last 72 hours.  Intake/Output Summary (Last 24 hours) at 08/10/2020 1319 Last data filed at 08/10/2020 0748 Gross per 24 hour  Intake 600 ml  Output -  Net 600 ml     Pressure Injury 07/26/20 Heel Left;Lateral Deep Tissue Pressure Injury - Purple or maroon localized area of discolored intact skin or blood-filled blister due to damage of underlying soft tissue from pressure and/or shear. purple and pink blistered area  (Active)  07/26/20 0730  Location: Heel  Location Orientation: Left;Lateral  Staging: Deep Tissue Pressure Injury - Purple or maroon localized area of discolored intact skin or blood-filled blister due to damage of underlying soft tissue from pressure and/or shear.  Wound Description (Comments): purple and pink blistered area on L  lateral heel  Present on Admission: -- (possible mistake on chart, maybe prior R heel documented by mistake)    Physical Exam: BP 114/76 (BP Location: Right Arm)   Pulse (!) 107   Temp 98.6 F (37 C)   Resp 18   Ht 5\' 10"  (1.778 m)   Wt (!) 142 kg   SpO2 98%   BMI 44.92 kg/m   Constitutional: No distress . Vital signs reviewed. HENT: Normocephalic.  Atraumatic. Neck: + Trach. Eyes: EOMI. No discharge. Cardiovascular: No JVD.  RRR. Respiratory: Normal effort.  No stridor.  Bilateral clear to auscultation. GI: Non-distended.  BS +. Skin: Warm and dry.   DTI to left lateral heel. Psych: Confused.  But more calm  Musc: Lower extremity edema.  No tenderness in extremities. Neuro: Alert Motor: 4-4+/5 throughout, unchanged  Assessment/Plan: 1. Functional deficits which require 3+ hours per day of interdisciplinary therapy in a comprehensive inpatient rehab setting.  Physiatrist is providing close team supervision and 24 hour management of active medical problems listed below.  Physiatrist and rehab team continue to assess barriers to discharge/monitor patient progress toward functional and medical goals   Care Tool:  Bathing    Body parts bathed by patient: Right arm,Left arm,Chest,Abdomen,Front perineal area,Buttocks,Right upper leg,Left upper leg,Right lower leg,Left lower leg,Face         Bathing assist Assist Level: Supervision/Verbal cueing     Upper Body Dressing/Undressing Upper body dressing   What is the patient wearing?: Pull over shirt    Upper body assist Assist Level: Supervision/Verbal cueing    Lower Body Dressing/Undressing Lower body dressing      What is the patient wearing?: Underwear/pull up,Pants     Lower body assist Assist for lower body dressing: Supervision/Verbal cueing     Toileting Toileting    Toileting assist Assist for toileting: Independent     Transfers Chair/bed transfer  Transfers assist  Chair/bed transfer activity did not occur: Safety/medical concerns  Chair/bed transfer assist level: Supervision/Verbal cueing     Locomotion Ambulation   Ambulation assist   Ambulation activity did not occur: Safety/medical concerns  Assist level: Supervision/Verbal cueing Assistive  device: No Device Max distance: 245ft   Walk 10 feet activity   Assist  Walk 10 feet activity did not occur: Safety/medical concerns  Assist level: Supervision/Verbal cueing Assistive device: No Device   Walk 50 feet activity   Assist Walk 50 feet with 2 turns activity did not occur:  Safety/medical concerns  Assist level: Supervision/Verbal cueing Assistive device: No Device    Walk 150 feet activity   Assist Walk 150 feet activity did not occur: Safety/medical concerns  Assist level: Supervision/Verbal cueing Assistive device: No Device    Walk 10 feet on uneven surface  activity   Assist Walk 10 feet on uneven surfaces activity did not occur: Safety/medical concerns   Assist level: Supervision/Verbal cueing     Wheelchair     Assist Will patient use wheelchair at discharge?: No Type of Wheelchair: Manual    Wheelchair assist level: Supervision/Verbal cueing Max wheelchair distance: 140ft    Wheelchair 50 feet with 2 turns activity    Assist        Assist Level: Supervision/Verbal cueing   Wheelchair 150 feet activity     Assist      Assist Level: Supervision/Verbal cueing    Medical Problem List and Plan: 1.  Acute hypoxic respiratory failure secondary to acute encephalopathy due to alcohol withdrawal  Continue CIR, ongoing patient education, plan for family education tomorrow 2.  Antithrombotics: -DVT/anticoagulation: Lovenox             -antiplatelet therapy: N/A 3. Pain Management: Neurontin 600 mg every 8 hours, Duragesic patch  Oxycodone as needed decreased to every 12 as needed in anticipation of discharge soon             Lidoderm patch ordered for left foot on 1/4  Oragel ordered for dental pain  Controlled with interventions on 1/6  Monitor with increased exertion 4. Mood: Valproic acid 500 mg twice daily             -antipsychotic agents: Seroquel 100 mg twice daily Zyprexa 5 mg 4 times daily as needed, Haldol 1 mg every 6 as needed  He feels Ativan is helping, continue for now, does sedate him so nursing has been giving after therapy sessions 5. Neuropsych: This patient is not capable of making decisions on his own behalf.  Continue telesitter for safety for impulsivity 6. Skin/Wound Care: Routine skin  checks 7. Fluids/Electrolytes/Nutrition: Routine in and outs 8.  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 Carloyn Manner.  Currently with #8 cuffed Shiley with no plan to downsize and follow-up outpatient             Duoneb PRN 9.  Gastrostomy tube 07/22/2020 per Dr. Caroleen Hamman.  Patient currently on mechanical soft thin liquid diet. 10.  Obesity.  BMI 44.98.  Dietary follow-up.  Encourage weight loss 11.  History of tobacco/alcohol polysubstance abuse.  Counsel when appropriate. 12.  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               Hemoglobin 9.8 on 1/3  Vitamin B12 severely low, supplement initiated  Continue to monitor 13.  Constipation.  MiraLAX daily 14.  Sleep disturbance             Melatonin 5 mg nightly  ECG reviewed borderline prolonged QTC, trazodone 50 started on 1/1  Continue sleep chart  Improved 15.  Peripheral edema  Lasix 20 daily started on 1/1, increased on 1/5  Improving  16.  Hypoalbuminemia  Supplement initiated  LOS:  6 days A FACE TO FACE EVALUATION WAS PERFORMED  [place] Karis Juba 08/10/2020, 1:19 PM

## 2020-08-10 NOTE — Progress Notes (Signed)
Occupational Therapy Discharge Summary  Patient Details  Name: Nicholas Valdez MRN: [id number] Date of Birth: 11/19/1980   Patient has met 9 of 9 long term goals due to improved activity tolerance, improved balance and postural control.  Patient to discharge at overall Supervision level.  Patient's family did not attend family education training for OT as pt requires increased need for family education with SLP and RN for trach care and cognition.  However extensive education completed with pt regarding recommendation for use of energy conservation strategies during ADLs and IADLs as well as recommendation for use of shower seat during showering for energy conservation and increased safety.  Recommending 24/7 supervision due to cognitive impairments, safety awareness, and impulsivity.  Pt verbalized understanding.  Reasons goals not met: N/A  Recommendation:  Patient will benefit from ongoing skilled OT services in outpatient setting to continue to advance functional skills in the area of BADL, iADL and Reduce care partner burden.  Equipment: No equipment provided  Reasons for discharge: treatment goals met and discharge from hospital  Patient/family agrees with progress made and goals achieved: Yes  OT Discharge Precautions/Restrictions  Precautions Precautions: Fall;Other (comment) Precaution Comments: capped trach monitor SpO2 and HR Vital Signs Therapy Vitals Temp: 98.1 F (36.7 C) Pulse Rate: 98 Resp: 20 BP: 119/74 Patient Position (if appropriate): Lying Oxygen Therapy SpO2: 100 % O2 Device: Room Air Pain Pain Assessment Pain Scale: 0-10 ADL ADL Eating: Independent Where Assessed-Eating: Edge of bed Grooming: Setup Where Assessed-Grooming: Standing at sink Upper Body Bathing: Supervision/safety Where Assessed-Upper Body Bathing: Shower Lower Body Bathing: Supervision/safety Where Assessed-Lower Body Bathing: Shower Upper Body Dressing: Setup Where  Assessed-Upper Body Dressing: Edge of bed Lower Body Dressing: Setup Where Assessed-Lower Body Dressing: Edge of bed Toileting: Independent Where Assessed-Toileting: Glass blower/designer: Modified Programmer, applications Method: Counselling psychologist: Raised Counselling psychologist: Close supervision Social research officer, government Method: Heritage manager: Radio broadcast assistant ADL Comments: All ambulatory transfers completed without AD Vision Baseline Vision/History: Wears glasses Wears Glasses: At all times Patient Visual Report: No change from baseline Perception  Perception: Within Functional Limits Praxis Praxis: Intact Praxis Impairment Details: Motor planning;Perseveration Cognition Overall Cognitive Status: Impaired/Different from baseline Arousal/Alertness: Awake/alert Attention: Focused;Sustained;Selective Focused Attention: Impaired Sustained Attention: Impaired Selective Attention: Impaired Memory: Impaired Awareness: Impaired Problem Solving: Impaired Behaviors: Restless;Impulsive Safety/Judgment: Impaired [employer] Scales of Cognitive Functioning: Confused/inappropriate/non-agitated Sensation Sensation Light Touch: Appears Intact Proprioception: Appears Intact Coordination Gross Motor Movements are Fluid and Coordinated: No Fine Motor Movements are Fluid and Coordinated: No Coordination and Movement Description: mild uncoordination due to generalized weakness, poor endurance, and decreased awareness Finger Nose Finger Test: mild dysmetria on LUE Heel Shin Test: decreased ROM and uncoordinated Motor  Motor Motor: Abnormal postural alignment and control Motor - Skilled Clinical Observations: mild uncoordination due to generalized weakness, poor endurance, and decreased awareness Mobility  Bed Mobility Bed Mobility: Rolling Right;Rolling Left;Sit to Supine;Supine to Sit Rolling Right: Supervision/verbal  cueing Rolling Left: Supervision/Verbal cueing Supine to Sit: Supervision/Verbal cueing Sit to Supine: Supervision/Verbal cueing Transfers Sit to Stand: Supervision/Verbal cueing Stand to Sit: Supervision/Verbal cueing  Trunk/Postural Assessment  Cervical Assessment Cervical Assessment: Exceptions to [facility] (forward head) Thoracic Assessment Thoracic Assessment: Exceptions to [facility] (rounded shoulders) Lumbar Assessment Lumbar Assessment: Exceptions to [facility] (posterior pelvic tilt) Postural Control Postural Control: Deficits on evaluation  Balance Balance Balance Assessed: Yes Static Sitting Balance Static Sitting - Balance Support: Feet supported;No upper extremity supported Static Sitting - Level of  Assistance: 7: Independent Dynamic Sitting Balance Dynamic Sitting - Balance Support: Feet supported;No upper extremity supported Dynamic Sitting - Level of Assistance: 7: Independent Static Standing Balance Static Standing - Balance Support: No upper extremity supported Static Standing - Level of Assistance: 5: Stand by assistance (supervision) Dynamic Standing Balance Dynamic Standing - Balance Support: No upper extremity supported Dynamic Standing - Level of Assistance: 5: Stand by assistance (supervision) Extremity/Trunk Assessment RUE Assessment RUE Assessment: Within Functional Limits Active Range of Motion (AROM) Comments: WNL General Strength Comments: 4-/5 grossly LUE Assessment LUE Assessment: Within Functional Limits Active Range of Motion (AROM) Comments: WNL General Strength Comments: 4-/5 grossly   Prudie Guthridge, [facility] 08/10/2020, 8:11 PM

## 2020-08-10 NOTE — Progress Notes (Signed)
Occupational Therapy Session Note  Patient Details  Name: Nicholas Valdez MRN: [id number] Date of Birth: [birth year]  Today's Date: 08/10/2020 OT Individual Time: 1505-1605 OT Individual Time Calculation (min): 60 min    Short Term Goals: Week 1:  OT Short Term Goal 1 (Week 1): STGs=LTGs due to ELOS  Skilled Therapeutic Interventions/Progress Updates:    1:1. Pt received in bed agreeable to OT. Pt requesting to shower at end of session. Pt completes functional mobility with S overall throughotu session to ADL apartment into kitchen. OT discusses energy conservation strategies with pt as pt needs rest break part way during mobility and is audibly winded with task. Pt gathers items from low cabinet as well as high shelf and out of fridge to simulate simple meal prep with S. Pt completes BADL overall with S for doffing clothing, gathering BADL items, bathing at sit to stand level in walk in shower and donning new clothing. Exited session with pt seated in bed, exit alarm on and call light in reach   Therapy Documentation Precautions:  Precautions Precautions: Fall,Other (comment) Precaution Comments: capped trach monitor SpO2 and HR Restrictions Weight Bearing Restrictions: No General:   Vital Signs: Therapy Vitals Temp: 98.5 F (36.9 C) Pulse Rate: 84 Resp: 18 BP: (!) 101/59 Patient Position (if appropriate): Lying Oxygen Therapy SpO2: 96 % O2 Device: Room Air Pain:   ADL: ADL Eating: Not assessed Grooming: Contact guard Where Assessed-Grooming: Standing at sink Upper Body Bathing: Supervision/safety Where Assessed-Upper Body Bathing: Sitting at sink Lower Body Bathing: Contact guard Where Assessed-Lower Body Bathing: Standing at sink,Sitting at sink,Edge of bed Upper Body Dressing: Contact guard Where Assessed-Upper Body Dressing: Standing at sink Lower Body Dressing: Contact guard Where Assessed-Lower Body Dressing: Edge of bed,Sitting at sink,Standing at  sink Toileting: Not assessed Toilet Transfer: Furniture conservator/restorer Method: Ambulating (without AD) Acupuncturist: Raised toilet seat Film/video editor: Administrator, arts Method: Designer, industrial/product: Emergency planning/management officer ADL Comments: All ambulatory transfers completed without AD Vision   Perception    Praxis   Exercises:   Other Treatments:     Therapy/Group: Individual Therapy  Shon Hale 08/10/2020, 6:33 AM

## 2020-08-10 NOTE — Discharge Instructions (Signed)
Inpatient Rehab Discharge Instructions  Nicholas Valdez Discharge date and time: No discharge date for patient encounter.   Activities/Precautions/ Functional Status: Activity: As tolerated Diet: Soft Wound Care: Routine skin checks Functional status:  ___ No restrictions     ___ Walk up steps independently ___ 24/7 supervision/assistance   ___ Walk up steps with assistance ___ Intermittent supervision/assistance  ___ Bathe/dress independently ___ Walk with walker     _x__ Bathe/dress with assistance ___ Walk Independently    ___ Shower independently ___ Walk with assistance    ___ Shower with assistance ___ No alcohol     ___ Return to work/school ________  Special Instructions: No driving smoking or alcohol  Follow-up with Dr. Bud Face in regards to maintenance of tracheostomy [phone]  Wound care.  Wash left heel with soap and water, pat dry.  Apply mupirocin ointment to wound.  Cover with foam dressing daily as needed  Gastrostomy tube should remain in place for at least 6 weeks initially placed 07/22/2020 follow-up with Dr. Sterling Big [phone]    COMMUNITY REFERRALS UPON DISCHARGE:    Outpatient: PT, OT, SP             Agency:[facility]  Phone:[phone]              Appointment Date/Time:WILL CALL MOM OR DAD TO SET UP APPOINTMENTS  Medical Equipment/Items Ordered:YONKERS SUCTION MACHINE, TRACH SUPPLIES AND TUB SEAT                                                 Agency/Supplier:[facility]   [phone]  My questions have been answered and I understand these instructions. I will adhere to these goals and the provided educational materials after my discharge from the hospital.  Patient/Caregiver Signature _______________________________ Date __________  Clinician Signature _______________________________________ Date __________  Please bring this form and your medication list with you to all your follow-up doctor's appointments.

## 2020-08-10 NOTE — Progress Notes (Signed)
Physical Therapy Session Note  Patient Details  Name: Nicholas Valdez MRN: [id number] Date of Birth: 12-14-1980  Today's Date: 08/10/2020 PT Individual Time: 0900-1009 PT Individual Time Calculation (min): 69 min   Short Term Goals: Week 1:  PT Short Term Goal 1 (Week 1): Pt will ambulate at least 172ft using LRAD with CGA PT Short Term Goal 2 (Week 1): Pt will ascend/descend 4 steps using HRs per home set-up with CGA PT Short Term Goal 3 (Week 1): Pt will demonstrate increased safety awareness during functional mobility via initiating a seated rest break when noticed labored breathing with only min cues 50% of the time  Skilled Therapeutic Interventions/Progress Updates:   Received pt sitting EOB, pt reported feeling tired and not feeling well due to not sleeping well last night and reported pain 7-9/10 in LLE but reported not being due for pain meds until 10am. Repositioning,rest breaks, and distraction done to reduce pain levels. Pt requested orange sherbert prior to doing therapy this morning. Therapist provided pt with sherbert and while pt ate, discussed recommendation for 24/7 supervision at home due to cognitive deficits, safety awareness, and impulsivity; pt in agreement with therapist's recommendation. Pt with questions regarding trach care and purpose of family ed with SLP tomorrow. Therapist answered questions for pt and informed pt that SLP would go further into cognitive and memory deficits and trach care more with his father. Session with emphasis on discharge planning, functional mobility/transfers, generalized strengthening, dynamic standing balance/coordination, NMR, ambulation, and improved activity tolerance. Pt requested to go to gym in [facility] and agreed to walk back at end of session. Pt transported to therapy gym in [facility] total A and performed the following activities on standing on [employer]: -catch baseball game on level easy for 1 minute with BUE support and supervision. O2 sat 94%  and HR 124bpm after activity decreasing to 108bpm with rest. -limits of stability training on level static for 58 seconds with BUE support and supervision. Pt stated "I don't like this game", when therapist asked if it was because it was difficulty pt said "no, it's because it's bullshit" demonstrated increased frustration.  -weight shift training on level static for 41 seconds with BUE support and supervision. Encouraged pt to use 1 UE support, however demonstrated LOB and refused to try using 1 UE only.  Pt transported to ortho gym and engaged in dynamic standing balance, fine motor control, visual scanning, problem solving, and attention performing the following activities on BITS: -maze with 3 errors due to running outside of lines; otherwise pt with 100% accuracy. -bell cancellation test with 10 missed bells. Pt demonstrating poor visual scanning pattern frequently jumping all over the screen.  Pt ambulated 266ft back towards room without AD and supervision but requested to be transported remainder of way in [facility] due to fatigue. Pt transferred WC<>bed with supervision and performed bed mobility mod I. Therapist donned bilateral ted hose and non-skid socks due to bilateral edema L>R with increased time and notified RN that pt was due for medications. Concluded session with pt supine in bed, needs within reach, and bed alarm on. Therapist provided fresh drink for pt.   Therapy Documentation Precautions:  Precautions Precautions: Fall,Other (comment) Precaution Comments: capped trach monitor SpO2 and HR Restrictions Weight Bearing Restrictions: No  Therapy/Group: Individual Therapy Corley Maffeo PT, DPT   08/10/2020, 7:20 AM

## 2020-08-10 NOTE — Progress Notes (Signed)
Speech Language Pathology Daily Session Note  Patient Details  Name: Nicholas Valdez MRN: [id number] Date of Birth: October 31, 1980  Today's Date: 08/10/2020 SLP Individual Time: 1100-1200 SLP Individual Time Calculation (min): 60 min  Short Term Goals: Week 1: SLP Short Term Goal 1 (Week 1): Pt will increase sustained attention to 5 minutes with mod A SLP Short Term Goal 2 (Week 1): Pt will increase simple problem solving for current environment with mod A SLP Short Term Goal 3 (Week 1): Pt will increase functional recall with use of compensatory memory strategies and visual aids with mod A SLP Short Term Goal 4 (Week 1): Pt will consume trials regular solids and thin liquid via straw with min A for use of compensatory strategies  Skilled Therapeutic Interventions:   Patient seen for skilled ST session to address cognitive-linguistic goals. Patient was cooperative, flat affect, verbose at times. He participated in completing some subtests from [facility] and received a 90% accuracy for Reading Instructions and 70% for Solving Daily Math Problems. He was able to perform mathematical calculations quickly but when presented with time math and date/month math he struggled with setup and organization, taking 5 minutes to solve one of these problems and ending up with incorrect response. (calculating what date would be 6 months from Nov 15; calculating hours of sleep). Patient continues to be impulsive but would tell SLP right before he was going to do something, ie when he said he was going to get up to look at his trach in mirror. Patient continues to benefit from SLP intervention with plan for discharge home with family after family education tomorrow.  Pain Pain Assessment Pain Scale: 0-10  Therapy/Group: Individual Therapy  Angela Nevin, MA, CCC-SLP Speech Therapy

## 2020-08-11 ENCOUNTER — Encounter (HOSPITAL_COMMUNITY): Payer: Medicaid Other | Admitting: Speech Pathology

## 2020-08-11 DIAGNOSIS — Z931 Gastrostomy status: Secondary | ICD-10-CM

## 2020-08-11 DIAGNOSIS — R1310 Dysphagia, unspecified: Secondary | ICD-10-CM

## 2020-08-11 LAB — CREATININE, SERUM
Creatinine, Ser: 0.81 mg/dL (ref 0.61–1.24)
GFR, Estimated: >60 mL/min (ref >60)

## 2020-08-11 NOTE — Plan of Care (Signed)
  Problem: Consults Goal: RH BRAIN INJURY PATIENT EDUCATION Description: Description: See Patient Education module for eduction specifics Outcome: Completed/Met   Problem: RH SKIN INTEGRITY Goal: RH STG SKIN FREE OF INFECTION/BREAKDOWN Description: With min assist Outcome: Completed/Met   Problem: RH SAFETY Goal: RH STG ADHERE TO SAFETY PRECAUTIONS W/ASSISTANCE/DEVICE Description: STG Adhere to Safety Precautions With  supervision/reminders Assistance/Device. Outcome: Completed/Met   Problem: RH COGNITION-NURSING Goal: RH STG USES MEMORY AIDS/STRATEGIES W/ASSIST TO PROBLEM SOLVE Description: STG Uses Memory Aids/Strategies With supervision/cues Assistance to Problem Solve. Outcome: Completed/Met   Problem: RH PAIN MANAGEMENT Goal: RH STG PAIN MANAGED AT OR BELOW PT'S PAIN GOAL Description: At or below level 4 Outcome: Completed/Met   Problem: RH KNOWLEDGE DEFICIT BRAIN INJURY Goal: RH STG INCREASE KNOWLEDGE OF SELF CARE AFTER BRAIN INJURY Description: Patient's caregiver will be able to manage care using handouts and educational materials Outcome: Completed/Met

## 2020-08-11 NOTE — Progress Notes (Signed)
RT provided patient and his father Janina Mayo Education prior to discharge. RT discussed and demonstrated cleaning, changing the inner cannula, suctioning, using the obturator, trach ties, and the trach cap.  Currently the family does not have suction equipment at home. RT asked RN to follow up with case manager.  All questions answered, trach supplies and trach education booklet given to father.

## 2020-08-11 NOTE — Progress Notes (Signed)
Inpatient Rehabilitation Care Coordinator Discharge Note  The overall goal for the admission was met for:   Discharge location: Yes-HOME WITH PARENTS WHO WILL PROVIDE 24/7 SUPERVISION LEVEL  Length of Stay: Yes-7 DAYS  Discharge activity level: Yes-SUPERVISION LEVEL  Home/community participation: Yes  Services provided included: MD, RD, PT, OT, SLP, RN, CM, Pharmacy and SW  Financial Services: Medicaid  Choices offered to/list presented to:yes  Follow-up services arranged: Outpatient: [facility]-PT,OT,SP WILL CALL MOM OR DAD TO SET UP APPOINTMENTS, DME: [facility] [facility], TRACH SUPPLIES AND [employer] SUCTION MACHINE and Patient/Family has no preference for HH/DME agencies AWARE OF SUBSTANCE ABUSE RESOURCES AND HAS USED IN THE PAST. WAS ENROLLED IN RTS PRIOR TO ADMISSION TO [place]  Comments (or additional information):DAD WAS HERE FOR EDUCATION OF [facility] AND NEED FOR 24/7 SUPERVISION DUE TO SAFETY ISSUES. DAD AWARE OF PT;S ISSUES AND NEED FOR SUPERVISION. PT TAKING ABOUT SMOKING MARIJUANA WHEN HE GETS HOME. UNAWARE OF HIS DEFICITS AND WILL BE HIGH RISK FOR RE-ADMISSION TO THE HOSPITAL.  Patient/Family verbalized understanding of follow-up arrangements: Yes  Individual responsible for coordination of the follow-up plan: BUCK-DAD [phone] OR KAREN-MOM [phone]  Confirmed correct DME delivered: Elease Hashimoto 08/11/2020    Elease Hashimoto

## 2020-08-11 NOTE — Progress Notes (Signed)
Patient discharged home with family, all belongings sent home, escorted out by tech.

## 2020-08-11 NOTE — Progress Notes (Signed)
Speech Language Pathology Discharge Summary  Patient Details  Name: Nicholas Valdez MRN: [id number] Date of Birth: [birth year]  Today's Date: 08/11/2020 SLP Individual Time: 0900-0930 SLP Individual Time Calculation (min): 30 min   Skilled Therapeutic Interventions:  Patient seen with father present for education regarding discharge plan (home today with family). SLP reviewed patient's progress and current level of function and safety needs with father (patient in room). SLP discussed with patient and father that trach care will be followed up through [facility] and they can contact them with any questions. Patient was cooperative but irritated and wanting to somewhat impatient about wanting to leave. He denied telling SLP that he was concerned he would over take his medications secondary to his h/o substance abuse and told SLP that he was worried he would "take too little". Patient continues to be impulsive with actions and when talking and affect remains flat.    Patient has met 7 of 7 long term goals.  Patient to discharge at overall Supervision level.  Reasons goals not met: N/A   Clinical Impression/Discharge Summary: Patient met all 7 STG's which were convervatively set secondary set. Patient is currently tolerating a Dys 3 solids, thin liquids diet which was recommended mainly because of patient's impulsivity with oral intake. Patient is being discharged with a trach however this was from hospitalization at [facility] and all future trach care management to be completed by them. Patient is able to complete mathematical computations accurately and rapidly but he does become disorganized at times when working on simulated complex level problem solving. His father was present on day of discharge for family education and he stated that patient appears at or near his baseline however his disrupted sleep pattern is different. Patient is at supervision level for safety at this point  and outpatient SLP recommended for more complex level cognitive goals.  Care Partner:  Caregiver Able to Provide Assistance: Yes  Type of Caregiver Assistance: Physical;Cognitive  Recommendation:  Outpatient SLP (neuropsych follow up)  Rationale for SLP Follow Up: Maximize cognitive function and independence   Equipment: N/A for speech   Reasons for discharge: Discharged from hospital   Patient/Family Agrees with Progress Made and Goals Achieved: Yes    Sonia Baller, MA, CCC-SLP Speech Therapy

## 2020-08-11 NOTE — Progress Notes (Signed)
[facility] PHYSICAL MEDICINE & REHABILITATION PROGRESS NOTE  Subjective/Complaints: Patient seen lying in bed this morning.  He states he did not sleep well overnight, because he was concerned about his father being able to take time off for education today.  He slept fairly well overnight per sleep chart.  ROS: Denies CP, SOB, N/V/D  Objective: Vital Signs: Blood pressure (!) 118/96, pulse 79, temperature 98.1 F (36.7 C), resp. rate 18, height 5\' 10"  (1.778 m), weight (!) 142 kg, SpO2 98 %. No results found. No results for input(s): WBC, HGB, HCT, PLT in the last 72 hours. Recent Labs    08/11/20 0513  CREATININE 0.81    Intake/Output Summary (Last 24 hours) at 08/11/2020 0911 Last data filed at 08/10/2020 1846 Gross per 24 hour  Intake 560 ml  Output -  Net 560 ml     Pressure Injury 07/26/20 Heel Left;Lateral Deep Tissue Pressure Injury - Purple or maroon localized area of discolored intact skin or blood-filled blister due to damage of underlying soft tissue from pressure and/or shear. purple and pink blistered area  (Active)  07/26/20 0730  Location: Heel  Location Orientation: Left;Lateral  Staging: Deep Tissue Pressure Injury - Purple or maroon localized area of discolored intact skin or blood-filled blister due to damage of underlying soft tissue from pressure and/or shear.  Wound Description (Comments): purple and pink blistered area on L  lateral heel  Present on Admission: -- (possible mistake on chart, maybe prior R heel documented by mistake)    Physical Exam: BP (!) 118/96 (BP Location: Right Arm)   Pulse 79   Temp 98.1 F (36.7 C)   Resp 18   Ht 5\' 10"  (1.778 m)   Wt (!) 142 kg   SpO2 98%   BMI 44.92 kg/m   Constitutional: No distress . Vital signs reviewed. HENT: Normocephalic.  Atraumatic. Neck: + Trach Eyes: EOMI. No discharge. Cardiovascular: No JVD.  RRR. Respiratory: Normal effort.  No stridor.  Bilateral clear to auscultation. GI: Non-distended.   BS +.  + PEG. Skin: Warm and dry.  Left lateral heel dressing CDI. Psych: Calm.  Some confusion, but improving. Musc: No edema in extremities.  No tenderness in extremities. Neuro: Alert Motor: 4-4+/5 throughout, stable  Assessment/Plan: 1. Functional deficits which require 3+ hours per day of interdisciplinary therapy in a comprehensive inpatient rehab setting.  Physiatrist is providing close team supervision and 24 hour management of active medical problems listed below.  Physiatrist and rehab team continue to assess barriers to discharge/monitor patient progress toward functional and medical goals   Care Tool:  Bathing    Body parts bathed by patient: Right arm,Left arm,Chest,Abdomen,Front perineal area,Buttocks,Right upper leg,Left upper leg,Right lower leg,Left lower leg,Face         Bathing assist Assist Level: Supervision/Verbal cueing     Upper Body Dressing/Undressing Upper body dressing   What is the patient wearing?: Pull over shirt    Upper body assist Assist Level: Set up assist    Lower Body Dressing/Undressing Lower body dressing      What is the patient wearing?: Underwear/pull up,Pants     Lower body assist Assist for lower body dressing: Set up assist     Toileting Toileting    Toileting assist Assist for toileting: Independent     Transfers Chair/bed transfer  Transfers assist  Chair/bed transfer activity did not occur: Safety/medical concerns  Chair/bed transfer assist level: Supervision/Verbal cueing     Locomotion Ambulation   Ambulation assist  Ambulation activity did not occur: Safety/medical concerns  Assist level: Supervision/Verbal cueing Assistive device: No Device Max distance: 248ft   Walk 10 feet activity   Assist  Walk 10 feet activity did not occur: Safety/medical concerns  Assist level: Supervision/Verbal cueing Assistive device: No Device   Walk 50 feet activity   Assist Walk 50 feet with 2 turns  activity did not occur: Safety/medical concerns  Assist level: Supervision/Verbal cueing Assistive device: No Device    Walk 150 feet activity   Assist Walk 150 feet activity did not occur: Safety/medical concerns  Assist level: Supervision/Verbal cueing Assistive device: No Device    Walk 10 feet on uneven surface  activity   Assist Walk 10 feet on uneven surfaces activity did not occur: Safety/medical concerns   Assist level: Supervision/Verbal cueing     Wheelchair     Assist Will patient use wheelchair at discharge?: No Type of Wheelchair: Manual    Wheelchair assist level: Supervision/Verbal cueing Max wheelchair distance: 119ft    Wheelchair 50 feet with 2 turns activity    Assist        Assist Level: Supervision/Verbal cueing   Wheelchair 150 feet activity     Assist      Assist Level: Supervision/Verbal cueing    Medical Problem List and Plan: 1.  Acute hypoxic respiratory failure secondary to acute encephalopathy due to alcohol withdrawal  DC after patient and family education today 2.  Antithrombotics: -DVT/anticoagulation: Lovenox  Creatinine within normal limits on 1/7             -antiplatelet therapy: N/A 3. Pain Management: Neurontin 600 mg every 8 hours, Duragesic patch  Oxycodone as needed decreased to every 12 as needed in anticipation of discharge soon             Lidoderm patch ordered for left foot on 1/4  Oragel ordered for dental pain  Controlled with interventions on 1/7  Monitor with increased exertion 4. Mood: Valproic acid 500 mg twice daily             -antipsychotic agents: Seroquel 100 mg twice daily Zyprexa 5 mg 4 times daily as needed, Haldol 1 mg every 6 as needed  He feels Ativan is helping, continue for now, does sedate him so nursing has been giving after therapy sessions 5. Neuropsych: This patient is not capable of making decisions on his own behalf.  Continue telesitter for safety for impulsivity 6.  Skin/Wound Care: Routine skin checks 7. Fluids/Electrolytes/Nutrition: Routine in and outs 8.  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 Bud Face.  Currently with #8 cuffed Shiley with no plan to downsize and follow-up outpatient             Duoneb PRN 9.  Gastrostomy tube 07/22/2020 per Dr. Sterling Big.  Patient currently on D3 thins  Follow-up as outpatient. 10.  Obesity.  BMI 44.98.  Dietary follow-up.  Encourage weight loss 11.  History of tobacco/alcohol polysubstance abuse.  Counsel when appropriate. 12.  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               Hemoglobin 9.8 on 1/3  Vitamin B12 severely low, supplement initiated  Continue to monitor 13.  Constipation.  MiraLAX daily 14.  Sleep disturbance             Melatonin 5 mg nightly  ECG reviewed borderline prolonged QTC, trazodone 50 started on 1/1  Continue sleep chart  Improved 15.  Peripheral edema  Lasix 20  daily started on 1/1, increased on 1/5  Improving  16.  Hypoalbuminemia  Supplement initiated  > 30 minutes spent in total in discharge planning between myself and PA regarding aforementioned, as well discussion regarding DME equipment, follow-up appointments, follow-up therapies, discharge medications, discharge recommendations, answering questions  LOS: 7 days A FACE TO FACE EVALUATION WAS PERFORMED  Amaranta Mehl Lorie Phenix 08/11/2020, 9:11 AM

## 2020-08-12 ENCOUNTER — Emergency Department
Admission: EM | Admit: 2020-08-12 | Discharge: 2020-08-13 | Disposition: A | Discharge status: Home / Self Care | Payer: Medicaid Other | Source: Home / Self Care

## 2020-08-12 ENCOUNTER — Emergency Department (HOSPITAL_COMMUNITY)
Admission: EM | Admit: 2020-08-12 | Discharge: 2020-08-12 | Disposition: A | Discharge status: Home / Self Care | Payer: Medicaid Other | Attending: Emergency Medicine | Admitting: Emergency Medicine

## 2020-08-12 ENCOUNTER — Emergency Department (HOSPITAL_BASED_OUTPATIENT_CLINIC_OR_DEPARTMENT_OTHER): Payer: Medicaid Other

## 2020-08-12 ENCOUNTER — Encounter (HOSPITAL_COMMUNITY): Payer: Medicaid Other

## 2020-08-12 ENCOUNTER — Emergency Department
Admission: EM | Admit: 2020-08-12 | Discharge: 2020-08-12 | Discharge status: Against Medical Advice | Payer: Medicaid Other | Attending: Emergency Medicine | Admitting: Emergency Medicine

## 2020-08-12 ENCOUNTER — Other Ambulatory Visit: Payer: Self-pay

## 2020-08-12 ENCOUNTER — Emergency Department (HOSPITAL_COMMUNITY): Payer: Medicaid Other

## 2020-08-12 DIAGNOSIS — M7989 Other specified soft tissue disorders: Secondary | ICD-10-CM

## 2020-08-12 DIAGNOSIS — F419 Anxiety disorder, unspecified: Secondary | ICD-10-CM | POA: Insufficient documentation

## 2020-08-12 DIAGNOSIS — R609 Edema, unspecified: Secondary | ICD-10-CM | POA: Diagnosis not present

## 2020-08-12 DIAGNOSIS — Z79899 Other long term (current) drug therapy: Secondary | ICD-10-CM | POA: Insufficient documentation

## 2020-08-12 DIAGNOSIS — Z5321 Procedure and treatment not carried out due to patient leaving prior to being seen by health care provider: Secondary | ICD-10-CM | POA: Insufficient documentation

## 2020-08-12 DIAGNOSIS — Z430 Encounter for attention to tracheostomy: Secondary | ICD-10-CM | POA: Diagnosis not present

## 2020-08-12 DIAGNOSIS — J9503 Malfunction of tracheostomy stoma: Secondary | ICD-10-CM | POA: Insufficient documentation

## 2020-08-12 DIAGNOSIS — I10 Essential (primary) hypertension: Secondary | ICD-10-CM | POA: Diagnosis not present

## 2020-08-12 DIAGNOSIS — R2242 Localized swelling, mass and lump, left lower limb: Secondary | ICD-10-CM | POA: Insufficient documentation

## 2020-08-12 DIAGNOSIS — R0602 Shortness of breath: Secondary | ICD-10-CM | POA: Diagnosis present

## 2020-08-12 DIAGNOSIS — F17200 Nicotine dependence, unspecified, uncomplicated: Secondary | ICD-10-CM | POA: Diagnosis not present

## 2020-08-12 NOTE — ED Triage Notes (Signed)
Pt states he is having difficulty keeping his inner tracheostomy tube in place. Pt states he needs more trach education, a shower chair and a prescription for home health to help him with his trach. Pt with unlabored resps and is able to speak without difficulty.

## 2020-08-12 NOTE — Progress Notes (Signed)
RT note: Pt from home with #8 Shiley cuffed trach that was capped on arrival. Per pt trach "fell out and hid dad helped him put it back in". BBS=  Clear/ diminished +ETCO2. SPO2 99% on RA. Pt placed on 28% ATC. Pt appears to be anxious and is complaining of pain. RT will continue to monitor

## 2020-08-12 NOTE — ED Notes (Signed)
Provided pt with applesauce per MD.

## 2020-08-12 NOTE — Discharge Instructions (Addendum)
Your ultrasound today did not show signs of blood clots in your leg.  You should follow up with your primary care provider for this issue.  Try to keep your leg elevated on a chair or couch as much as possible at home.  You can also buy compression stockings over the counter at most pharmacies that can help squeeze some of the fluid out of your legs.

## 2020-08-12 NOTE — ED Notes (Signed)
Pt stated he needed to use the restroom and could not use urinal. This RN removed supplemental O2 and contacted Marisue Ivan RT to confirm pt okay to remove 02. Pt remained at 99% before ambulating to restroom. Pt did become short of breath but O2 levels remained high 90's.  Pt returned to bed and is on monitors. Pt not placed on O2 at this time. Pt 97-99%.

## 2020-08-12 NOTE — ED Notes (Signed)
Pt states that he was at [facility] earlier today for the same thing but they worked up his leg swelling instead. Pt states that he only wants his trach changed and suctioned.

## 2020-08-12 NOTE — ED Triage Notes (Signed)
BIB EMS from home. Pt states he was sitting at home and noticed that Trach fell out. Dad helped and put it back in. Pt became short of breath and anxious afterwards and called EMS. Pt states he feels like it isn't placed in there correctly.   138/80 96% room air 95-102 heart rate

## 2020-08-12 NOTE — ED Triage Notes (Signed)
Pt comes pov with trach coming out. Pt states that [facility] recently and he's having trouble keeping it in place and would like to be suctioned while he is here. In NAD.

## 2020-08-12 NOTE — ED Provider Notes (Signed)
[facility] Emergency Department Provider Note   ____________________________________________    I have reviewed the triage vital signs and the nursing notes.   HISTORY  Chief Complaint Tracheostomy Tube Change     HPI Nicholas Valdez is a 40 y.o. male with chronic trach [facility] who presents here because he reports his trach is loose, no difficulty breathing but does report suctioning as well.  Past Medical History:  Diagnosis Date  . Alcohol abuse   . Anxiety   . Depression   . Drug abuse (HCC)   . GERD (gastroesophageal reflux disease)   . Hepatitis C   . Hypertension   . Pneumonia   . Pre-diabetes   . Sleep apnea     Patient Active Problem List   Diagnosis Date Noted  . Dysphagia   . Hypoalbuminemia due to protein-calorie malnutrition (HCC)   . Left foot pain   . Peripheral edema   . Impulsive   . Acute encephalopathy   . Hyperammonemia (HCC)   . Morbid obesity (HCC)   . Sleep disturbance   . Acute on chronic anemia   . Status post tracheostomy (HCC)   . S/P percutaneous endoscopic gastrostomy (PEG) tube placement (HCC)   . Pressure injury of skin 07/25/2020  . Acute respiratory failure with hypoxia (HCC)   . Elevated lactic acid level 06/20/2020  . Alcohol withdrawal delirium (HCC) 06/19/2020  . Increased ammonia level 06/19/2020  . Acute metabolic encephalopathy 06/19/2020  . Elevated LFTs 06/19/2020  . Tobacco abuse 06/19/2020  . Alcohol withdrawal (HCC) 12/15/2018    Past Surgical History:  Procedure Laterality Date  . GASTROSTOMY W/ FEEDING TUBE Left 07/22/2020  . TRACHEOSTOMY TUBE PLACEMENT N/A 07/10/2020   Procedure: TRACHEOSTOMY;  Surgeon: Bud Face, MD;  Location: [facility] ORS;  Service: ENT;  Laterality: N/A;    Prior to Admission medications   Medication Sig Start Date End Date Taking? Authorizing Provider  cyanocobalamin 1000 MCG tablet  Take 1 tablet (1,000 mcg total) by mouth daily. 08/09/20   Angiulli, Mcarthur Rossetti, PA-C  divalproex (DEPAKOTE) 500 MG DR tablet Take 1 tablet (500 mg total) by mouth every 12 (twelve) hours. 08/09/20   Angiulli, Mcarthur Rossetti, PA-C  docusate sodium (COLACE) 100 MG capsule Take 1 capsule (100 mg total) by mouth 2 (two) times daily. 08/09/20   Angiulli, Mcarthur Rossetti, PA-C  fentaNYL (DURAGESIC) 100 MCG/HR Place 1 patch onto the skin every 3 (three) days. 08/09/20   Angiulli, Mcarthur Rossetti, PA-C  folic acid (FOLVITE) 1 MG tablet Take 1 tablet (1 mg total) by mouth daily. 08/09/20   Angiulli, Mcarthur Rossetti, PA-C  furosemide (LASIX) 40 MG tablet Take 1 tablet (40 mg total) by mouth daily. 08/10/20   Angiulli, Mcarthur Rossetti, PA-C  gabapentin (NEURONTIN) 300 MG capsule Take 2 capsules (600 mg total) by mouth every 8 (eight) hours. 08/09/20   Angiulli, Mcarthur Rossetti, PA-C  lidocaine (LIDODERM) 5 % Place 1 patch onto the skin daily. Remove & Discard patch within 12 hours or as directed by MD 08/10/20   Angiulli, Mcarthur Rossetti, PA-C  LORazepam (ATIVAN) 0.5 MG tablet Take 1 tablet (0.5 mg total) by mouth every 4 (four) hours as needed for anxiety. 08/09/20   Angiulli, Mcarthur Rossetti, PA-C  magnesium oxide (MAG-OX) 400 (241.3 Mg) MG tablet Take 1 tablet (400 mg total) by mouth 2 (two) times daily. 08/09/20   Angiulli, Mcarthur Rossetti, PA-C  melatonin 5 MG  TABS Take 1 tablet (5 mg total) by mouth at bedtime. 08/09/20   Angiulli, Mcarthur Rossetti, PA-C  Multiple Vitamin (MULTIVITAMIN WITH MINERALS) TABS tablet Take 1 tablet by mouth daily. 08/05/20   Burnadette Pop, MD  OLANZapine (ZYPREXA) 5 MG tablet Take 1 tablet (5 mg total) by mouth 4 (four) times daily as needed (agitation). 08/09/20   Angiulli, Mcarthur Rossetti, PA-C  oxyCODONE (OXY IR/ROXICODONE) 5 MG immediate release tablet Take 1 tablet (5 mg total) by mouth every 12 (twelve) hours as needed for severe pain. 08/09/20   Angiulli, Mcarthur Rossetti, PA-C  pantoprazole (PROTONIX) 40 MG tablet Take 1 tablet (40 mg total) by mouth daily before breakfast. 08/10/20    Angiulli, Mcarthur Rossetti, PA-C  polyethylene glycol (MIRALAX / GLYCOLAX) 17 g packet Take 17 g by mouth daily. Patient not taking: No sig reported 08/05/20   Burnadette Pop, MD  QUEtiapine (SEROQUEL) 100 MG tablet Take 1 tablet (100 mg total) by mouth 2 (two) times daily. 08/09/20   Angiulli, Mcarthur Rossetti, PA-C  scopolamine (TRANSDERM-SCOP) 1 MG/3DAYS Place 1 patch (1.5 mg total) onto the skin every 3 (three) days. 08/09/20   Angiulli, Mcarthur Rossetti, PA-C  traZODone (DESYREL) 50 MG tablet Take 1 tablet (50 mg total) by mouth at bedtime. 08/09/20   Angiulli, Mcarthur Rossetti, PA-C     Allergies Ondansetron, Penicillins, and Promethazine  Family History  Problem Relation Age of Onset  . Hypertension Other     Social History Social History   Tobacco Use  . Smoking status: Current Every Day Smoker  . Smokeless tobacco: Never Used  Vaping Use  . Vaping Use: Former  Substance Use Topics  . Alcohol use: Yes    Comment: Pt states that he usually drinks a pint of whiskey per day but has had none today.   . Drug use: Yes    Types: Marijuana    Review of Systems  Constitutional: No fever/chills  ENT: No congestion      ____________________________________________   PHYSICAL EXAM:  VITAL SIGNS: ED Triage Vitals  Enc Vitals Group     BP 08/12/20 1335 125/74     Pulse Rate 08/12/20 1335 (!) 110     Resp 08/12/20 1335 18     Temp 08/12/20 1335 98.1 F (36.7 C)     Temp Source 08/12/20 1335 Oral     SpO2 08/12/20 1335 96 %     Weight 08/12/20 1334 (!) 145 kg (319 lb 10.7 oz)     Height 08/12/20 1334 1.778 m (5\' 10" )     Head Circumference --      Peak Flow --      Pain Score 08/12/20 1334 0     Pain Loc --      Pain Edu? --      Excl. in GC? --      Constitutional: Alert and oriented. No acute distress. Pleasant and interactive Eyes: Conjunctivae are normal.   Mouth/Throat: Mucous membranes are moist.    Respiratory: Normal respiratory effort.  No retractions.  Trach functioning  appropriately, normal position, patient breathing without difficulty    Neurologic:  Normal speech and language. No gross focal neurologic deficits are appreciated.   Skin:  Skin is warm, dry and intact. No rash noted.   ____________________________________________   LABS (all labs ordered are listed, but only abnormal results are displayed)  Labs Reviewed - No data to display ____________________________________________  EKG   ____________________________________________  RADIOLOGY   ____________________________________________   PROCEDURES  Procedure(s) performed: No  Procedures   Critical Care performed: No ____________________________________________   INITIAL IMPRESSION / ASSESSMENT AND PLAN / ED COURSE  Pertinent labs & imaging results that were available during my care of the patient were reviewed by me and considered in my medical decision making (see chart for details).  Patient seen by me, I spoke with cardiopulmonary service they obtained materials to potentially swap out inner cannula however patient apparently eloped before this could be completed   ____________________________________________   FINAL CLINICAL IMPRESSION(S) / ED DIAGNOSES  Final diagnoses:  Tracheostomy care [facility])      NEW MEDICATIONS STARTED DURING THIS VISIT:  Discharge Medication List as of 08/12/2020  3:13 PM       Note:  This document was prepared using Dragon voice recognition software and may include unintentional dictation errors.   Jene Every, MD 08/12/20 [phone]

## 2020-08-12 NOTE — ED Provider Notes (Signed)
Pt signed out to me by Dr Clayborne Dana.  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  He is pending DVT ultrasound of LLE.  Anticipate discharge home after testing.  He was discharged home from rehab yesterday.  Update - no DVT reported on ultrasound.  He has some reactive lymph nodes in the leg.  Patient's oxygen levels stable while ambulating to bathroom.   Nicholas Sleeper, MD 08/12/20 [phone]

## 2020-08-12 NOTE — Progress Notes (Signed)
VASCULAR LAB    Bilateral lower extremity venous duplex has been performed.  See CV proc for preliminary results.   Gave verbal report to Dr. Roosvelt Harps, [facility], RVT 08/12/2020, 12:48 PM

## 2020-08-13 NOTE — ED Notes (Signed)
Called for vital sign update. No answer, pt is not in the lobby or outside

## 2020-08-13 NOTE — ED Notes (Signed)
Called for vital sign recheck. No answer. Pt not visualized in the lobby.

## 2020-08-13 NOTE — ED Notes (Signed)
Called for treatment room, no answer, pt not in lobby

## 2020-08-14 ENCOUNTER — Other Ambulatory Visit: Payer: Self-pay

## 2020-08-14 DIAGNOSIS — J9601 Acute respiratory failure with hypoxia: Secondary | ICD-10-CM

## 2020-08-15 ENCOUNTER — Encounter: Payer: Self-pay | Admitting: *Deleted

## 2020-08-15 ENCOUNTER — Emergency Department: Payer: Medicaid Other

## 2020-08-15 ENCOUNTER — Other Ambulatory Visit: Payer: Self-pay

## 2020-08-15 DIAGNOSIS — R202 Paresthesia of skin: Secondary | ICD-10-CM | POA: Insufficient documentation

## 2020-08-15 DIAGNOSIS — R079 Chest pain, unspecified: Secondary | ICD-10-CM | POA: Diagnosis not present

## 2020-08-15 DIAGNOSIS — Z5321 Procedure and treatment not carried out due to patient leaving prior to being seen by health care provider: Secondary | ICD-10-CM | POA: Insufficient documentation

## 2020-08-15 LAB — BASIC METABOLIC PANEL
Anion gap: 9 (ref 5–15)
BUN: 6 mg/dL (ref 6–20)
CO2: 30 mmol/L (ref 22–32)
Calcium: 9.8 mg/dL (ref 8.9–10.3)
Chloride: 98 mmol/L (ref 98–111)
Creatinine, Ser: 0.85 mg/dL (ref 0.61–1.24)
GFR, Estimated: >60 mL/min (ref >60)
Glucose, Bld: 130 mg/dL — ABNORMAL HIGH (ref 70–99)
Potassium: 3.3 mmol/L — ABNORMAL LOW (ref 3.5–5.1)
Sodium: 137 mmol/L (ref 135–145)

## 2020-08-15 LAB — TROPONIN I (HIGH SENSITIVITY): Troponin I (High Sensitivity): 4 ng/L (ref <18)

## 2020-08-15 LAB — CBC
HCT: 35.6 % — ABNORMAL LOW (ref 39.0–52.0)
Hemoglobin: 11.4 g/dL — ABNORMAL LOW (ref 13.0–17.0)
MCH: 31.3 pg (ref 26.0–34.0)
MCHC: 32 g/dL (ref 30.0–36.0)
MCV: 97.8 fL (ref 80.0–100.0)
Platelets: 377 10*3/uL (ref 150–400)
RBC: 3.64 MIL/uL — ABNORMAL LOW (ref 4.22–5.81)
RDW: 13.5 % (ref 11.5–15.5)
WBC: 12.3 10*3/uL — ABNORMAL HIGH (ref 4.0–10.5)
nRBC: 0 % (ref 0.0–0.2)

## 2020-08-15 NOTE — ED Triage Notes (Signed)
Pt reports left side chest pain and numbness in left arm.  Pt has a trach.  No n/v   cig smoker.  No cough .  Pt alert  Speech clear.

## 2020-08-16 ENCOUNTER — Ambulatory Visit
Admission: RE | Admit: 2020-08-16 | Discharge: 2020-08-16 | Disposition: A | Discharge status: Home / Self Care | Payer: Medicaid Other | Source: Ambulatory Visit | Attending: Surgery | Admitting: Surgery

## 2020-08-16 ENCOUNTER — Emergency Department
Admission: EM | Admit: 2020-08-16 | Discharge: 2020-08-16 | Disposition: A | Discharge status: Home / Self Care | Payer: Medicaid Other | Attending: Emergency Medicine | Admitting: Emergency Medicine

## 2020-08-16 ENCOUNTER — Encounter: Payer: Self-pay | Admitting: Surgery

## 2020-08-16 ENCOUNTER — Ambulatory Visit
Admission: RE | Admit: 2020-08-16 | Discharge: 2020-08-16 | Disposition: A | Discharge status: Home / Self Care | Payer: Medicaid Other | Attending: Surgery | Admitting: Surgery

## 2020-08-16 ENCOUNTER — Ambulatory Visit (INDEPENDENT_AMBULATORY_CARE_PROVIDER_SITE_OTHER): Payer: Medicaid Other | Admitting: Surgery

## 2020-08-16 VITALS — BP 141/88 | HR 97 | Temp 97.7°F | Ht 70.0 in | Wt 307.6 lb

## 2020-08-16 DIAGNOSIS — R1010 Upper abdominal pain, unspecified: Secondary | ICD-10-CM | POA: Diagnosis not present

## 2020-08-16 DIAGNOSIS — J9601 Acute respiratory failure with hypoxia: Secondary | ICD-10-CM | POA: Insufficient documentation

## 2020-08-16 NOTE — Patient Instructions (Signed)
See your follow up appointment below.

## 2020-08-16 NOTE — ED Notes (Signed)
Pt. Refused repeat blood work at this time.

## 2020-08-19 ENCOUNTER — Other Ambulatory Visit: Payer: Self-pay

## 2020-08-19 ENCOUNTER — Encounter: Payer: Self-pay | Admitting: Intensive Care

## 2020-08-19 DIAGNOSIS — K9423 Gastrostomy malfunction: Secondary | ICD-10-CM | POA: Diagnosis not present

## 2020-08-19 DIAGNOSIS — Z5321 Procedure and treatment not carried out due to patient leaving prior to being seen by health care provider: Secondary | ICD-10-CM | POA: Insufficient documentation

## 2020-08-19 NOTE — Progress Notes (Signed)
Outpatient Surgical Follow Up  08/19/2020  Nicholas Valdez is an 40 y.o. male.   Chief Complaint  Patient presents with  . Routine Post Op    Peg tube issues    HPI: Nicholas Valdez is a 40 year old male well-known to me with a recent prolonged hospitalization due to respiratory failure, encephalopathy and polysubstance abuse. He now has recovered and has made significant improvement over the last few weeks. Currently he is eating and tolerating regular diet. No fevers no chills. He now has some discomfort due to the G-tube. He feels that the G-tube is pulling. No real abdominal pain. He denies any fevers and chills.  Past Medical History:  Diagnosis Date  . Alcohol abuse   . Anxiety   . Depression   . Drug abuse (HCC)   . GERD (gastroesophageal reflux disease)   . Hepatitis C   . Hypertension   . Pneumonia   . Pre-diabetes   . Sleep apnea     Past Surgical History:  Procedure Laterality Date  . GASTROSTOMY W/ FEEDING TUBE Left 07/22/2020  . TRACHEOSTOMY TUBE PLACEMENT N/A 07/10/2020   Procedure: TRACHEOSTOMY;  Surgeon: Bud Face, MD;  Location: [facility] ORS;  Service: ENT;  Laterality: N/A;    Family History  Problem Relation Age of Onset  . Hypertension Other     Social History:  reports that he has been smoking. He has never used smokeless tobacco. He reports current alcohol use. He reports current drug use. Drug: Marijuana.  Allergies:  Allergies  Allergen Reactions  . Penicillins Hives    Did it involve swelling of the face/tongue/throat, SOB, or low BP? Yes Did it involve sudden or severe rash/hives, skin peeling, or any reaction on the inside of your mouth or nose? Yes Did you need to seek medical attention at a hospital or doctor's office? Yes When did it last happen?childhood If all above answers are "NO", may proceed with cephalosporin use.  Patient tolerated Ancef administration well  . Promethazine Hives  . Zofran [Ondansetron] Hives     Medications reviewed.    ROS Full ROS performed and is otherwise negative other than what is stated in HPI   BP (!) 141/88   Pulse 97   Temp 97.7 F (36.5 C) (Oral)   Ht 5\' 10"  (1.778 m)   Wt (!) 307 lb 9.6 oz (139.5 kg)   SpO2 98%   BMI 44.14 kg/m   Physical Exam Vitals and nursing note reviewed. Exam conducted with a chaperone present.  Constitutional:      Appearance: Normal appearance.  Neck:     Comments: Trach in place Cardiovascular:     Rate and Rhythm: Normal rate and regular rhythm.  Pulmonary:     Effort: Pulmonary effort is normal. No respiratory distress.     Breath sounds: Normal breath sounds.  Abdominal:     General: Abdomen is flat. There is no distension.     Palpations: Abdomen is soft. There is no mass.     Tenderness: There is no abdominal tenderness. There is no right CVA tenderness or guarding.     Hernia: No hernia is present.     Comments: g tube in place w/o infection. No peritonitis. Other incisions healed.  Musculoskeletal:        General: Normal range of motion.     Cervical back: Normal range of motion and neck supple. No rigidity or tenderness.  Lymphadenopathy:     Cervical: No cervical adenopathy.  Skin:  General: Skin is warm and dry.     Capillary Refill: Capillary refill takes less than 2 seconds.  Neurological:     General: No focal deficit present.     Mental Status: He is alert and oriented to person, place, and time.  Psychiatric:        Mood and Affect: Mood normal.        Behavior: Behavior normal.        Thought Content: Thought content normal.        Judgment: Judgment normal.       Assessment/Plan: 40 year old male status post gastrostomy tube. He has not been quite 6 weeks. Discussed with him in detail that we needed to form a tract before removal of the tube. He shows understanding. I will see him back once he completes the 6 weeks postoperatively. Currently at this time there seems to be no complications.  I also discussed with him that the pulling of the tube is appropriate and expected given that legally he does have a balloon tube that is pulling on his stomach. He shows understanding. Greater than 50% of the 25 minutes  visit was spent in counseling/coordination of care   Sterling Big, MD [facility] General Surgeon

## 2020-08-19 NOTE — ED Triage Notes (Signed)
Patient is concerned his feeding tube is misplaced. Reports it is suppose to come out 09/04/20. Denies any other issues. Also has trach present but reports no issues.

## 2020-08-20 ENCOUNTER — Emergency Department
Admission: EM | Admit: 2020-08-20 | Discharge: 2020-08-20 | Disposition: A | Discharge status: Home / Self Care | Payer: Medicaid Other | Attending: Emergency Medicine | Admitting: Emergency Medicine

## 2020-08-20 NOTE — ED Provider Notes (Signed)
[facility] EMERGENCY DEPARTMENT Provider Note   CSN: [id number] Arrival date & time: 08/12/20  0420     History Chief Complaint  Patient presents with  . Shortness of Breath    Nicholas Valdez is a 40 y.o. male.  States his trach became dislodged. Also states his left leg swollen for last week while in rehab and not evaluated for it.    Shortness of Breath Severity:  Mild Onset quality:  Sudden Timing:  Constant Progression:  Resolved Chronicity:  New Context: not activity   Relieved by:  None tried Worsened by:  Nothing Ineffective treatments:  None tried Associated symptoms: no abdominal pain        Past Medical History:  Diagnosis Date  . Alcohol abuse   . Anxiety   . Depression   . Drug abuse (HCC)   . GERD (gastroesophageal reflux disease)   . Hepatitis C   . Hypertension   . Pneumonia   . Pre-diabetes   . Sleep apnea     Patient Active Problem List   Diagnosis Date Noted  . Dysphagia   . Hypoalbuminemia due to protein-calorie malnutrition (HCC)   . Left foot pain   . Peripheral edema   . Impulsive   . Acute encephalopathy   . Hyperammonemia (HCC)   . Morbid obesity (HCC)   . Sleep disturbance   . Acute on chronic anemia   . Status post tracheostomy (HCC)   . S/P percutaneous endoscopic gastrostomy (PEG) tube placement (HCC)   . Pressure injury of skin 07/25/2020  . Acute respiratory failure with hypoxia (HCC)   . Elevated lactic acid level 06/20/2020  . Alcohol withdrawal delirium (HCC) 06/19/2020  . Increased ammonia level 06/19/2020  . Acute metabolic encephalopathy 06/19/2020  . Elevated LFTs 06/19/2020  . Tobacco abuse 06/19/2020  . Alcohol withdrawal (HCC) 12/15/2018    Past Surgical History:  Procedure Laterality Date  . GASTROSTOMY W/ FEEDING TUBE Left 07/22/2020  . TRACHEOSTOMY TUBE PLACEMENT N/A 07/10/2020   Procedure: TRACHEOSTOMY;  Surgeon: Bud Face, MD;  Location: [facility] ORS;  Service: ENT;   Laterality: N/A;       Family History  Problem Relation Age of Onset  . Hypertension Other     Social History   Tobacco Use  . Smoking status: Current Every Day Smoker    Types: Cigarettes  . Smokeless tobacco: Never Used  Vaping Use  . Vaping Use: Former  Substance Use Topics  . Alcohol use: Not Currently    Comment: last drink november 2021   . Drug use: Yes    Types: Marijuana    Home Medications Prior to Admission medications   Medication Sig Start Date End Date Taking? Authorizing Provider  cyanocobalamin 1000 MCG tablet Take 1 tablet (1,000 mcg total) by mouth daily. 08/09/20   Angiulli, Mcarthur Rossetti, PA-C  divalproex (DEPAKOTE) 500 MG DR tablet Take 1 tablet (500 mg total) by mouth every 12 (twelve) hours. 08/09/20   Angiulli, Mcarthur Rossetti, PA-C  docusate sodium (COLACE) 100 MG capsule Take 1 capsule (100 mg total) by mouth 2 (two) times daily. 08/09/20   Angiulli, Mcarthur Rossetti, PA-C  fentaNYL (DURAGESIC) 100 MCG/HR Place 1 patch onto the skin every 3 (three) days. 08/09/20   Angiulli, Mcarthur Rossetti, PA-C  folic acid (FOLVITE) 1 MG tablet Take 1 tablet (1 mg total) by mouth daily. 08/09/20   Angiulli, Mcarthur Rossetti, PA-C  furosemide (LASIX) 40 MG tablet Take 1 tablet (40 mg total) by  mouth daily. 08/10/20   Angiulli, Mcarthur Rossetti, PA-C  gabapentin (NEURONTIN) 300 MG capsule Take 2 capsules (600 mg total) by mouth every 8 (eight) hours. 08/09/20   Angiulli, Mcarthur Rossetti, PA-C  lidocaine (LIDODERM) 5 % Place 1 patch onto the skin daily. Remove & Discard patch within 12 hours or as directed by MD 08/10/20   Angiulli, Mcarthur Rossetti, PA-C  magnesium oxide (MAG-OX) 400 (241.3 Mg) MG tablet Take 1 tablet (400 mg total) by mouth 2 (two) times daily. 08/09/20   Angiulli, Mcarthur Rossetti, PA-C  melatonin 5 MG TABS Take 1 tablet (5 mg total) by mouth at bedtime. 08/09/20   Angiulli, Mcarthur Rossetti, PA-C  Multiple Vitamin (MULTIVITAMIN WITH MINERALS) TABS tablet Take 1 tablet by mouth daily. 08/05/20   Burnadette Pop, MD  OLANZapine (ZYPREXA) 5 MG  tablet Take 1 tablet (5 mg total) by mouth 4 (four) times daily as needed (agitation). 08/09/20   Angiulli, Mcarthur Rossetti, PA-C  pantoprazole (PROTONIX) 40 MG tablet Take 1 tablet (40 mg total) by mouth daily before breakfast. 08/10/20   Angiulli, Mcarthur Rossetti, PA-C  QUEtiapine (SEROQUEL) 100 MG tablet Take 1 tablet (100 mg total) by mouth 2 (two) times daily. 08/09/20   Angiulli, Mcarthur Rossetti, PA-C  scopolamine (TRANSDERM-SCOP) 1 MG/3DAYS Place 1 patch (1.5 mg total) onto the skin every 3 (three) days. 08/09/20   Angiulli, Mcarthur Rossetti, PA-C  traZODone (DESYREL) 50 MG tablet Take 1 tablet (50 mg total) by mouth at bedtime. 08/09/20   Angiulli, Mcarthur Rossetti, PA-C    Allergies    Penicillins, Promethazine, and Zofran [ondansetron]  Review of Systems   Review of Systems  Respiratory: Positive for shortness of breath.   Gastrointestinal: Negative for abdominal pain.  All other systems reviewed and are negative.   Physical Exam Updated Vital Signs BP 119/73   Pulse 97   Temp 97.9 F (36.6 C) (Oral)   Resp 16   Ht 5\' 10"  (1.778 m)   Wt (!) 145.2 kg   SpO2 98%   BMI 45.92 kg/m   Physical Exam Vitals and nursing note reviewed.  Constitutional:      Appearance: He is well-developed and well-nourished.  HENT:     Head: Normocephalic and atraumatic.  Cardiovascular:     Rate and Rhythm: Normal rate.  Pulmonary:     Effort: Pulmonary effort is normal. No respiratory distress.  Abdominal:     General: There is no distension.  Musculoskeletal:        General: Normal range of motion.     Cervical back: Normal range of motion.     Right lower leg: Edema present.     Left lower leg: Edema (worse on left) present.  Skin:    General: Skin is warm and dry.  Neurological:     Mental Status: He is alert.     ED Results / Procedures / Treatments   Labs (all labs ordered are listed, but only abnormal results are displayed) Labs Reviewed - No data to display  EKG EKG Interpretation  Date/Time:  Saturday  August 12 2020 04:25:31 EST Ventricular Rate:  91 PR Interval:    QRS Duration: 119 QT Interval:  368 QTC Calculation: 453 R Axis:   -26 Text Interpretation: Sinus rhythm Nonspecific intraventricular conduction delay Low voltage, precordial leads Confirmed by 1973) on 08/12/2020 4:27:50 AM Also confirmed by 10/10/2020 [phone]), editor [address] [place] ([address])  on 08/13/2020 10:56:22 AM   Radiology No results found.  Procedures Procedures (  including critical care time)  Medications Ordered in ED Medications - No data to display  ED Course  I have reviewed the triage vital signs and the nursing notes.  Pertinent labs & imaging results that were available during my care of the patient were reviewed by me and considered in my medical decision making (see chart for details).    MDM Rules/Calculators/A&P                           breathing/respiratory stable here after suctioning, trach adjustment (prior to my evaluation) by respiratroy. Will get [place] of leg to ensure no DVT, then discharge.   Care transferred pending [place].  Final Clinical Impression(s) / ED Diagnoses Final diagnoses:  Leg swelling    Rx / DC Orders ED Discharge Orders    None       Bedie Dominey, Barbara Cower, MD 08/20/20 1118

## 2020-08-20 NOTE — ED Notes (Signed)
No answer x3

## 2020-08-21 ENCOUNTER — Ambulatory Visit: Payer: Medicaid Other | Admitting: Physical Therapy

## 2020-08-23 ENCOUNTER — Encounter: Payer: Medicaid Other | Admitting: Speech Pathology

## 2020-08-28 ENCOUNTER — Ambulatory Visit: Payer: Medicaid Other | Admitting: Occupational Therapy

## 2020-08-28 ENCOUNTER — Ambulatory Visit: Payer: Medicaid Other

## 2020-08-31 ENCOUNTER — Encounter: Payer: Self-pay | Admitting: *Deleted

## 2020-08-31 ENCOUNTER — Other Ambulatory Visit: Payer: Self-pay

## 2020-08-31 ENCOUNTER — Emergency Department
Admission: EM | Admit: 2020-08-31 | Discharge: 2020-09-01 | Disposition: A | Discharge status: Home / Self Care | Payer: Medicaid Other | Attending: Emergency Medicine | Admitting: Emergency Medicine

## 2020-08-31 ENCOUNTER — Ambulatory Visit: Payer: Medicaid Other

## 2020-08-31 ENCOUNTER — Encounter: Payer: Medicaid Other | Admitting: Occupational Therapy

## 2020-08-31 DIAGNOSIS — Z431 Encounter for attention to gastrostomy: Secondary | ICD-10-CM | POA: Diagnosis not present

## 2020-08-31 DIAGNOSIS — Z79899 Other long term (current) drug therapy: Secondary | ICD-10-CM | POA: Diagnosis not present

## 2020-08-31 DIAGNOSIS — I10 Essential (primary) hypertension: Secondary | ICD-10-CM | POA: Diagnosis not present

## 2020-08-31 DIAGNOSIS — K9420 Gastrostomy complication, unspecified: Secondary | ICD-10-CM

## 2020-08-31 DIAGNOSIS — F17210 Nicotine dependence, cigarettes, uncomplicated: Secondary | ICD-10-CM | POA: Insufficient documentation

## 2020-08-31 MED ORDER — LIDOCAINE-PRILOCAINE 2.5-2.5 % EX CREA: TOPICAL_CREAM | Freq: Once | CUTANEOUS | Status: AC

## 2020-08-31 MED ORDER — TRAMADOL HCL 50 MG PO TABS: 50.0000 mg | ORAL_TABLET | Freq: Four times a day (QID) | ORAL | 0 refills | Status: AC | PRN

## 2020-08-31 MED ORDER — TRAMADOL HCL 50 MG PO TABS
50.0000 mg | ORAL_TABLET | Freq: Once | ORAL | Status: AC
  Administered 2020-08-31: 50 mg via ORAL
  Filled 2020-08-31: qty 1

## 2020-08-31 MED ORDER — LIDOCAINE-EPINEPHRINE-TETRACAINE (LET) TOPICAL GEL: 3.0000 mL | Freq: Once | TOPICAL | Status: DC

## 2020-08-31 NOTE — ED Triage Notes (Signed)
Pt ambulatory to triage.  Pt has a feeding tube and was playing with his dog and pulled the tube loose.  Tube in place x 6 weeks  Pt alert speech clear.

## 2020-08-31 NOTE — ED Provider Notes (Signed)
[facility] Emergency Department Provider Note   ____________________________________________   Event Date/Time   First MD Initiated Contact with Patient 08/31/20 2122     (approximate)  I have reviewed the triage vital signs and the nursing notes.   HISTORY  Chief Complaint feeding tube dislodged    HPI Nicholas Valdez is a 40 y.o. male with a stated past medical history of alcohol abuse and feeding tube in place after prolonged ICU stay who presents for pain around the PEG tube site after his 20 pound dog jumped on his stomach and partially ripped the stitches out of the top.  Patient describes burning pain along the superior aspect of the flange of his PEG tube as well as internal abdominal pain underneath this area as well.  Patient denies any bleeding during the event or afterwards.  Patient denies any nausea/vomiting/sharp chest pain/shortness of breath         Past Medical History:  Diagnosis Date  . Alcohol abuse   . Anxiety   . Depression   . Drug abuse (HCC)   . GERD (gastroesophageal reflux disease)   . Hepatitis C   . Hypertension   . Pneumonia   . Pre-diabetes   . Sleep apnea     Patient Active Problem List   Diagnosis Date Noted  . Dysphagia   . Hypoalbuminemia due to protein-calorie malnutrition (HCC)   . Left foot pain   . Peripheral edema   . Impulsive   . Acute encephalopathy   . Hyperammonemia (HCC)   . Morbid obesity (HCC)   . Sleep disturbance   . Acute on chronic anemia   . Status post tracheostomy (HCC)   . S/P percutaneous endoscopic gastrostomy (PEG) tube placement (HCC)   . Pressure injury of skin 07/25/2020  . Acute respiratory failure with hypoxia (HCC)   . Elevated lactic acid level 06/20/2020  . Alcohol withdrawal delirium (HCC) 06/19/2020  . Increased ammonia level 06/19/2020  . Acute metabolic encephalopathy 06/19/2020  . Elevated LFTs 06/19/2020  . Tobacco abuse 06/19/2020  . Alcohol withdrawal  (HCC) 12/15/2018    Past Surgical History:  Procedure Laterality Date  . GASTROSTOMY W/ FEEDING TUBE Left 07/22/2020  . TRACHEOSTOMY TUBE PLACEMENT N/A 07/10/2020   Procedure: TRACHEOSTOMY;  Surgeon: Bud Face, MD;  Location: [facility] ORS;  Service: ENT;  Laterality: N/A;    Prior to Admission medications   Medication Sig Start Date End Date Taking? Authorizing Provider  traMADol (ULTRAM) 50 MG tablet Take 1 tablet (50 mg total) by mouth every 6 (six) hours as needed for up to 3 days. 08/31/20 09/03/20 Yes Merwyn Katos, MD  cyanocobalamin 1000 MCG tablet Take 1 tablet (1,000 mcg total) by mouth daily. 08/09/20   Angiulli, Mcarthur Rossetti, PA-C  divalproex (DEPAKOTE) 500 MG DR tablet Take 1 tablet (500 mg total) by mouth every 12 (twelve) hours. 08/09/20   Angiulli, Mcarthur Rossetti, PA-C  docusate sodium (COLACE) 100 MG capsule Take 1 capsule (100 mg total) by mouth 2 (two) times daily. 08/09/20   Angiulli, Mcarthur Rossetti, PA-C  fentaNYL (DURAGESIC) 100 MCG/HR Place 1 patch onto the skin every 3 (three) days. 08/09/20   Angiulli, Mcarthur Rossetti, PA-C  folic acid (FOLVITE) 1 MG tablet Take 1 tablet (1 mg total) by mouth daily. 08/09/20   Angiulli, Mcarthur Rossetti, PA-C  furosemide (LASIX) 40 MG tablet Take 1 tablet (40 mg total) by mouth daily. 08/10/20   Angiulli, Mcarthur Rossetti, PA-C  gabapentin (NEURONTIN) 300  MG capsule Take 2 capsules (600 mg total) by mouth every 8 (eight) hours. 08/09/20   Angiulli, Mcarthur Rossetti, PA-C  lidocaine (LIDODERM) 5 % Place 1 patch onto the skin daily. Remove & Discard patch within 12 hours or as directed by MD 08/10/20   Angiulli, Mcarthur Rossetti, PA-C  magnesium oxide (MAG-OX) 400 (241.3 Mg) MG tablet Take 1 tablet (400 mg total) by mouth 2 (two) times daily. 08/09/20   Angiulli, Mcarthur Rossetti, PA-C  melatonin 5 MG TABS Take 1 tablet (5 mg total) by mouth at bedtime. 08/09/20   Angiulli, Mcarthur Rossetti, PA-C  Multiple Vitamin (MULTIVITAMIN WITH MINERALS) TABS tablet Take 1 tablet by mouth daily. 08/05/20   Burnadette Pop, MD  OLANZapine  (ZYPREXA) 5 MG tablet Take 1 tablet (5 mg total) by mouth 4 (four) times daily as needed (agitation). 08/09/20   Angiulli, Mcarthur Rossetti, PA-C  pantoprazole (PROTONIX) 40 MG tablet Take 1 tablet (40 mg total) by mouth daily before breakfast. 08/10/20   Angiulli, Mcarthur Rossetti, PA-C  QUEtiapine (SEROQUEL) 100 MG tablet Take 1 tablet (100 mg total) by mouth 2 (two) times daily. 08/09/20   Angiulli, Mcarthur Rossetti, PA-C  scopolamine (TRANSDERM-SCOP) 1 MG/3DAYS Place 1 patch (1.5 mg total) onto the skin every 3 (three) days. 08/09/20   Angiulli, Mcarthur Rossetti, PA-C  traZODone (DESYREL) 50 MG tablet Take 1 tablet (50 mg total) by mouth at bedtime. 08/09/20   Angiulli, Mcarthur Rossetti, PA-C    Allergies Penicillins, Promethazine, and Zofran [ondansetron]  Family History  Problem Relation Age of Onset  . Hypertension Other     Social History Social History   Tobacco Use  . Smoking status: Current Every Day Smoker    Types: Cigarettes  . Smokeless tobacco: Never Used  Vaping Use  . Vaping Use: Former  Substance Use Topics  . Alcohol use: Not Currently    Comment: last drink november 2021   . Drug use: Yes    Types: Marijuana    Review of Systems Constitutional: No fever/chills Eyes: No visual changes. ENT: No sore throat. Cardiovascular: Denies chest pain. Respiratory: Denies shortness of breath. Gastrointestinal: Endorses abdominal pain.  No nausea, no vomiting.  No diarrhea. Genitourinary: Negative for dysuria. Musculoskeletal: Negative for acute arthralgias Skin: Negative for rash. Neurological: Negative for headaches, weakness/numbness/paresthesias in any extremity Psychiatric: Negative for suicidal ideation/homicidal ideation   ____________________________________________   PHYSICAL EXAM:  VITAL SIGNS: ED Triage Vitals  Enc Vitals Group     BP 08/31/20 2001 112/74     Pulse Rate 08/31/20 2001 (!) 115     Resp 08/31/20 2001 18     Temp 08/31/20 2001 98.3 F (36.8 C)     Temp Source 08/31/20 2001 Oral      SpO2 08/31/20 2001 97 %     Weight 08/31/20 1959 300 lb (136.1 kg)     Height 08/31/20 1959 6' (1.829 m)     Head Circumference --      Peak Flow --      Pain Score 08/31/20 1959 6     Pain Loc --      Pain Edu? --      Excl. in GC? --    Constitutional: Alert and oriented. Well appearing and in no acute distress. Eyes: Conjunctivae are normal. PERRL. Head: Atraumatic. Nose: No congestion/rhinnorhea. Mouth/Throat: Mucous membranes are moist. Neck: No stridor Cardiovascular: Grossly normal heart sounds.  Good peripheral circulation. Respiratory: Normal respiratory effort.  No retractions. Gastrointestinal: Soft and nontender. No distention.  PEG tube in place to left upper quadrant with mild erythema around the superior portion and suture that has come undone in the left lateral aspect.  The surrounding skin shows no significant induration or purulent drainage from the ostomy Musculoskeletal: No obvious deformities Neurologic:  Normal speech and language. No gross focal neurologic deficits are appreciated. Skin:  Skin is warm and dry. No rash noted. Psychiatric: Mood and affect are normal. Speech and behavior are normal.  ____________________________________________   LABS (all labs ordered are listed, but only abnormal results are displayed)  Labs Reviewed - No data to display  PROCEDURES  Procedure(s) performed (including Critical Care):  .1-3 Lead EKG Interpretation Performed by: Merwyn Katos, MD Authorized by: Merwyn Katos, MD     Interpretation: normal     ECG rate:  97   ECG rate assessment: normal     Rhythm: sinus rhythm     Ectopy: none     Conduction: normal       ____________________________________________   INITIAL IMPRESSION / ASSESSMENT AND PLAN / ED COURSE  As part of my medical decision making, I reviewed the following data within the electronic MEDICAL RECORD NUMBER Nursing notes reviewed and incorporated, Labs reviewed, Old chart reviewed,  and Notes from prior ED visits reviewed and incorporated        Patient a 40 year old male who presents for PEG tube complication after his dog caused some trauma.  PEG tube is in place and flushing/drawing appropriately.  Patient's pain well controlled after topical lidocaine and oral tramadol.  Patient states that he has follow-up with his surgeon in the next 2 days for reevaluation.  The patient has been reexamined and is ready to be discharged.  All diagnostic results have been reviewed and discussed with the patient/family.  Care plan has been outlined and the patient/family understands all current diagnoses, results, and treatment plans.  There are no new complaints, changes, or physical findings at this time.  All questions have been addressed and answered.  All medications, if any, that were given while in the emergency department or any that are being prescribed have been reviewed with the patient/family.  All side effects and adverse reactions have been explained.  Patient was instructed to, and agrees to follow-up with their primary care physician as well as return to the emergency department if any new or worsening symptoms develop.      ____________________________________________   FINAL CLINICAL IMPRESSION(S) / ED DIAGNOSES  Final diagnoses:  Complication of feeding tube [facility])     ED Discharge Orders         Ordered    traMADol (ULTRAM) 50 MG tablet  Every 6 hours PRN        08/31/20 2205           Note:  This document was prepared using Dragon voice recognition software and may include unintentional dictation errors.   Merwyn Katos, MD 08/31/20 2255

## 2020-08-31 NOTE — ED Notes (Signed)
No xrays/labs at this time per dr Larinda Buttery

## 2020-09-04 ENCOUNTER — Ambulatory Visit (INDEPENDENT_AMBULATORY_CARE_PROVIDER_SITE_OTHER): Payer: Medicaid Other | Admitting: Surgery

## 2020-09-04 ENCOUNTER — Other Ambulatory Visit: Payer: Self-pay

## 2020-09-04 ENCOUNTER — Encounter: Payer: Self-pay | Admitting: Surgery

## 2020-09-04 VITALS — BP 132/92 | HR 112 | Temp 98.2°F | Ht 70.0 in | Wt 291.0 lb

## 2020-09-04 DIAGNOSIS — R1010 Upper abdominal pain, unspecified: Secondary | ICD-10-CM | POA: Diagnosis not present

## 2020-09-04 DIAGNOSIS — Z431 Encounter for attention to gastrostomy: Secondary | ICD-10-CM

## 2020-09-04 NOTE — Patient Instructions (Signed)
Please refrain from bending over until wound is healed. If you have any concerns or questions, please feel to call our office.

## 2020-09-05 ENCOUNTER — Encounter: Payer: Medicaid Other | Admitting: Occupational Therapy

## 2020-09-05 ENCOUNTER — Encounter: Payer: Medicaid Other | Admitting: Speech Pathology

## 2020-09-05 ENCOUNTER — Ambulatory Visit: Payer: Medicaid Other

## 2020-09-05 DIAGNOSIS — R109 Unspecified abdominal pain: Secondary | ICD-10-CM | POA: Diagnosis not present

## 2020-09-05 DIAGNOSIS — R112 Nausea with vomiting, unspecified: Secondary | ICD-10-CM | POA: Insufficient documentation

## 2020-09-05 DIAGNOSIS — Z5321 Procedure and treatment not carried out due to patient leaving prior to being seen by health care provider: Secondary | ICD-10-CM | POA: Insufficient documentation

## 2020-09-05 NOTE — ED Triage Notes (Addendum)
Patient ambulatory to triage with steady gait, without difficulty or distress noted; pt reports peg tube removed yesterday (was in place for 6wks); c/o nausea and vomiting with solid foods only and pain around removal site; gauze dressing D&I; st fluids stay down

## 2020-09-06 ENCOUNTER — Emergency Department
Admission: EM | Admit: 2020-09-06 | Discharge: 2020-09-06 | Disposition: A | Discharge status: Home / Self Care | Payer: Medicaid Other | Attending: Emergency Medicine | Admitting: Emergency Medicine

## 2020-09-06 ENCOUNTER — Encounter: Payer: Self-pay | Admitting: Surgery

## 2020-09-06 ENCOUNTER — Other Ambulatory Visit: Payer: Self-pay

## 2020-09-06 ENCOUNTER — Encounter: Payer: Self-pay | Admitting: Emergency Medicine

## 2020-09-06 LAB — CBC WITH DIFFERENTIAL/PLATELET
Abs Immature Granulocytes: 0.04 10*3/uL (ref 0.00–0.07)
Basophils Absolute: 0.1 10*3/uL (ref 0.0–0.1)
Basophils Relative: 0 %
Eosinophils Absolute: 0.4 10*3/uL (ref 0.0–0.5)
Eosinophils Relative: 3 %
HCT: 45.8 % (ref 39.0–52.0)
Hemoglobin: 15 g/dL (ref 13.0–17.0)
Immature Granulocytes: 0 %
Lymphocytes Relative: 28 %
Lymphs Abs: 3.9 10*3/uL (ref 0.7–4.0)
MCH: 30.1 pg (ref 26.0–34.0)
MCHC: 32.8 g/dL (ref 30.0–36.0)
MCV: 91.8 fL (ref 80.0–100.0)
Monocytes Absolute: 1 10*3/uL (ref 0.1–1.0)
Monocytes Relative: 7 %
Neutro Abs: 8.6 10*3/uL — ABNORMAL HIGH (ref 1.7–7.7)
Neutrophils Relative %: 62 %
Platelets: 391 10*3/uL (ref 150–400)
RBC: 4.99 MIL/uL (ref 4.22–5.81)
RDW: 13.8 % (ref 11.5–15.5)
WBC: 14 10*3/uL — ABNORMAL HIGH (ref 4.0–10.5)
nRBC: 0 % (ref 0.0–0.2)

## 2020-09-06 LAB — COMPREHENSIVE METABOLIC PANEL
ALT: 28 U/L (ref 0–44)
AST: 30 U/L (ref 15–41)
Albumin: 3.9 g/dL (ref 3.5–5.0)
Alkaline Phosphatase: 93 U/L (ref 38–126)
Anion gap: 14 (ref 5–15)
BUN: <5 mg/dL — ABNORMAL LOW (ref 6–20)
CO2: 28 mmol/L (ref 22–32)
Calcium: 9.5 mg/dL (ref 8.9–10.3)
Chloride: 97 mmol/L — ABNORMAL LOW (ref 98–111)
Creatinine, Ser: 0.84 mg/dL (ref 0.61–1.24)
GFR, Estimated: >60 mL/min (ref >60)
Glucose, Bld: 134 mg/dL — ABNORMAL HIGH (ref 70–99)
Potassium: 3 mmol/L — ABNORMAL LOW (ref 3.5–5.1)
Sodium: 139 mmol/L (ref 135–145)
Total Bilirubin: 0.5 mg/dL (ref 0.3–1.2)
Total Protein: 8.2 g/dL — ABNORMAL HIGH (ref 6.5–8.1)

## 2020-09-06 LAB — LIPASE, BLOOD: Lipase: 37 U/L (ref 11–51)

## 2020-09-06 NOTE — Progress Notes (Signed)
Nicholas Valdez is a 40 year old male status post robotic gastrostomy tube over 6 weeks ago he is eating well and recovering well.  He wishes to have his G-tube removed.  PE NAD ABd: Soft G-tube in place a different balloon and pulled the G-tube without complications.  Sterile dressing applied   A/P doing well Keep dressing and change it as needed.  I did tell him that is going to drain for the next few days until the hole will close.

## 2020-09-07 ENCOUNTER — Encounter: Payer: Medicaid Other | Admitting: Speech Pathology

## 2020-09-07 ENCOUNTER — Ambulatory Visit: Payer: Medicaid Other

## 2020-09-07 ENCOUNTER — Encounter: Payer: Medicaid Other | Admitting: Occupational Therapy

## 2020-09-12 ENCOUNTER — Ambulatory Visit: Payer: Medicaid Other

## 2020-09-12 ENCOUNTER — Encounter: Payer: Medicaid Other | Admitting: Speech Pathology

## 2020-09-12 ENCOUNTER — Encounter: Payer: Medicaid Other | Admitting: Occupational Therapy

## 2020-09-14 ENCOUNTER — Ambulatory Visit: Payer: Medicaid Other

## 2020-09-14 ENCOUNTER — Encounter: Payer: Medicaid Other | Admitting: Occupational Therapy

## 2020-09-14 ENCOUNTER — Encounter: Payer: Medicaid Other | Admitting: Speech Pathology

## 2020-09-22 ENCOUNTER — Other Ambulatory Visit: Payer: Self-pay

## 2020-09-22 ENCOUNTER — Emergency Department
Admission: EM | Admit: 2020-09-22 | Discharge: 2020-09-22 | Disposition: A | Discharge status: Home / Self Care | Payer: No Typology Code available for payment source | Attending: Emergency Medicine | Admitting: Emergency Medicine

## 2020-09-22 DIAGNOSIS — R000 Tachycardia, unspecified: Secondary | ICD-10-CM | POA: Insufficient documentation

## 2020-09-22 DIAGNOSIS — F17210 Nicotine dependence, cigarettes, uncomplicated: Secondary | ICD-10-CM | POA: Insufficient documentation

## 2020-09-22 DIAGNOSIS — I10 Essential (primary) hypertension: Secondary | ICD-10-CM | POA: Diagnosis not present

## 2020-09-22 DIAGNOSIS — F1010 Alcohol abuse, uncomplicated: Secondary | ICD-10-CM | POA: Insufficient documentation

## 2020-09-22 DIAGNOSIS — L97421 Non-pressure chronic ulcer of left heel and midfoot limited to breakdown of skin: Secondary | ICD-10-CM | POA: Insufficient documentation

## 2020-09-22 DIAGNOSIS — R110 Nausea: Secondary | ICD-10-CM | POA: Diagnosis present

## 2020-09-22 DIAGNOSIS — L97401 Non-pressure chronic ulcer of unspecified heel and midfoot limited to breakdown of skin: Secondary | ICD-10-CM

## 2020-09-22 MED ORDER — CHLORDIAZEPOXIDE HCL 25 MG PO CAPS: 25.0000 mg | ORAL_CAPSULE | ORAL | 0 refills | Status: DC

## 2020-09-22 MED ORDER — LORAZEPAM 2 MG PO TABS
2.0000 mg | ORAL_TABLET | Freq: Once | ORAL | Status: AC
  Administered 2020-09-22: 2 mg via ORAL
  Filled 2020-09-22: qty 1

## 2020-09-22 MED ORDER — LORAZEPAM 1 MG PO TABS: 1.0000 mg | ORAL_TABLET | Freq: Once | ORAL | Status: DC

## 2020-09-22 MED ORDER — THIAMINE HCL 100 MG PO TABS: 100.0000 mg | ORAL_TABLET | Freq: Every day | ORAL | 3 refills | Status: DC

## 2020-09-22 MED ORDER — FOLIC ACID 1 MG PO TABS: 1.0000 mg | ORAL_TABLET | Freq: Every day | ORAL | 1 refills | Status: DC

## 2020-09-22 NOTE — Discharge Instructions (Signed)
A referral has been placed to the wound care clinic.  I recommend that you start a multivitamin daily.  Please do not take Librium while drinking alcohol as this can be a dangerous combination.

## 2020-09-22 NOTE — ED Provider Notes (Signed)
[facility] Emergency Department Provider Note  ____________________________________________   Event Date/Time   First MD Initiated Contact with Patient 09/22/20 0259     (approximate)  I have reviewed the triage vital signs and the nursing notes.   HISTORY  Chief Complaint Withdrawal and Pressure Ulcer    HPI Nicholas Valdez is a 40 y.o. male with history of alcohol abuse presenting to the emergency department requesting outpatient resources and Librium taper for help with alcohol withdrawal.  States he last drank alcohol at 6 PM.  He states he feels slightly tremulous and is having nausea.  No chest pain, shortness of breath, vomiting or diarrhea.  States he drinks multiple small bottles of wine a day.  No drug use.  He states he is not interested in inpatient treatment at this time.  States Librium has worked well for him in the past.  States he was sober for approximately 3 months and then relapsed 2 weeks ago.  He denies SI or HI.  No noticed ulcer to the left heel today.  No drainage or bleeding.  No injury that he can recall.  No fevers.  Requesting referral to wound care.  No history of diabetes.        Past Medical History:  Diagnosis Date  . Alcohol abuse   . Anxiety   . Depression   . Drug abuse (HCC)   . GERD (gastroesophageal reflux disease)   . Hepatitis C   . Hypertension   . Pneumonia   . Pre-diabetes   . Sleep apnea     Patient Active Problem List   Diagnosis Date Noted  . Dysphagia   . Hypoalbuminemia due to protein-calorie malnutrition (HCC)   . Left foot pain   . Peripheral edema   . Impulsive   . Acute encephalopathy   . Hyperammonemia (HCC)   . Morbid obesity (HCC)   . Sleep disturbance   . Acute on chronic anemia   . Status post tracheostomy (HCC)   . S/P percutaneous endoscopic gastrostomy (PEG) tube placement (HCC)   . Pressure injury of skin 07/25/2020  . Acute respiratory failure with hypoxia (HCC)   .  Elevated lactic acid level 06/20/2020  . Alcohol withdrawal delirium (HCC) 06/19/2020  . Increased ammonia level 06/19/2020  . Acute metabolic encephalopathy 06/19/2020  . Elevated LFTs 06/19/2020  . Tobacco abuse 06/19/2020  . Alcohol withdrawal (HCC) 12/15/2018    Past Surgical History:  Procedure Laterality Date  . GASTROSTOMY W/ FEEDING TUBE Left 07/22/2020  . TRACHEOSTOMY TUBE PLACEMENT N/A 07/10/2020   Procedure: TRACHEOSTOMY;  Surgeon: Bud Face, MD;  Location: [facility] ORS;  Service: ENT;  Laterality: N/A;    Prior to Admission medications   Medication Sig Start Date End Date Taking? Authorizing Provider  chlordiazePOXIDE (LIBRIUM) 25 MG capsule Take 1 capsule (25 mg total) by mouth as directed. Take 50 mg every 6 hours as needed for day 1, then 25 mg every 6 hours as needed for day 2-5 09/22/20  Yes Fransisco Messmer, Layla Maw, DO  folic acid (FOLVITE) 1 MG tablet Take 1 tablet (1 mg total) by mouth daily. 09/22/20 09/22/21 Yes Roe Koffman, Layla Maw, DO  thiamine 100 MG tablet Take 1 tablet (100 mg total) by mouth daily. 09/22/20  Yes Mayling Aber, Layla Maw, DO  cyanocobalamin 1000 MCG tablet Take 1 tablet (1,000 mcg total) by mouth daily. 08/09/20   Angiulli, Mcarthur Rossetti, PA-C  furosemide (LASIX) 40 MG tablet Take 1 tablet (40 mg total)  by mouth daily. 08/10/20   Angiulli, Mcarthur Rossetti, PA-C  gabapentin (NEURONTIN) 300 MG capsule Take 2 capsules (600 mg total) by mouth every 8 (eight) hours. 08/09/20   Angiulli, Mcarthur Rossetti, PA-C  lidocaine (LIDODERM) 5 % Place 1 patch onto the skin daily. Remove & Discard patch within 12 hours or as directed by MD 08/10/20   Angiulli, Mcarthur Rossetti, PA-C  magnesium oxide (MAG-OX) 400 (241.3 Mg) MG tablet Take 1 tablet (400 mg total) by mouth 2 (two) times daily. 08/09/20   Angiulli, Mcarthur Rossetti, PA-C  melatonin 5 MG TABS Take 1 tablet (5 mg total) by mouth at bedtime. 08/09/20   Angiulli, Mcarthur Rossetti, PA-C  Multiple Vitamin (MULTIVITAMIN WITH MINERALS) TABS tablet Take 1 tablet by mouth daily. 08/05/20    Burnadette Pop, MD  traZODone (DESYREL) 50 MG tablet Take 1 tablet (50 mg total) by mouth at bedtime. 08/09/20   Angiulli, Mcarthur Rossetti, PA-C    Allergies Penicillins and Zofran [ondansetron]  Family History  Problem Relation Age of Onset  . Hypertension Other     Social History Social History   Tobacco Use  . Smoking status: Current Every Day Smoker    Types: Cigarettes  . Smokeless tobacco: Never Used  Vaping Use  . Vaping Use: Former  Substance Use Topics  . Alcohol use: Not Currently    Comment: last drink november 2021   . Drug use: Yes    Types: Marijuana    Review of Systems Constitutional: No fever. Eyes: No visual changes. ENT: No sore throat. Cardiovascular: Denies chest pain. Respiratory: Denies shortness of breath. Gastrointestinal: No nausea, vomiting, diarrhea. Genitourinary: Negative for dysuria. Musculoskeletal: Negative for back pain. Skin: Negative for rash. Neurological: Negative for focal weakness or numbness.  ____________________________________________   PHYSICAL EXAM:  VITAL SIGNS: ED Triage Vitals  Enc Vitals Group     BP 09/22/20 0213 (!) 142/103     Pulse Rate 09/22/20 0213 (!) 126     Resp 09/22/20 0213 20     Temp 09/22/20 0213 98 F (36.7 C)     Temp Source 09/22/20 0213 Oral     SpO2 09/22/20 0213 97 %     Weight 09/22/20 0230 260 lb (117.9 kg)     Height 09/22/20 0230 5\' 10"  (1.778 m)     Head Circumference --      Peak Flow --      Pain Score 09/22/20 0230 5     Pain Loc --      Pain Edu? --      Excl. in GC? --    CONSTITUTIONAL: Alert and oriented and responds appropriately to questions. Well-appearing; well-nourished HEAD: Normocephalic EYES: Conjunctivae clear, pupils appear equal, EOM appear intact ENT: normal nose; moist mucous membranes NECK: Supple, normal ROM CARD: RRR; S1 and S2 appreciated; no murmurs, no clicks, no rubs, no gallops RESP: Normal chest excursion without splinting or tachypnea; breath sounds  clear and equal bilaterally; no wheezes, no rhonchi, no rales, no hypoxia or respiratory distress, speaking full sentences ABD/GI: Normal bowel sounds; non-distended; soft, non-tender, no rebound, no guarding, no peritoneal signs, no hepatosplenomegaly BACK: The back appears normal EXT: Normal ROM in all joints; no deformity noted, no edema; no cyanosis, 2+ left DP pulse, no joint effusion in the left ankle, compartments in the left lower extremity soft SKIN: Normal color for age and race; warm; no rash on exposed skin, no diaphoresis, 2 x 3 cm superficial ulcer to the left heel without redness,  warmth, drainage.  Ulcer appears to only involve the skin. NEURO: Moves all extremities equally, patient is not tremulous PSYCH: The patient's mood and manner are appropriate.    Left Foot   Patient gave verbal permission to utilize photo for medical documentation only. The image was not stored on any personal device.  ____________________________________________   LABS (all labs ordered are listed, but only abnormal results are displayed)  Labs Reviewed - No data to display ____________________________________________  EKG  None ____________________________________________  RADIOLOGY I, Dailyn Reith, personally viewed and evaluated these images (plain radiographs) as part of my medical decision making, as well as reviewing the written report by the radiologist.  ED MD interpretation: None  Official radiology report(s): No results found.  ____________________________________________   PROCEDURES  Procedure(s) performed (including Critical Care):  Procedures    ____________________________________________   INITIAL IMPRESSION / ASSESSMENT AND PLAN / ED COURSE  As part of my medical decision making, I reviewed the following data within the electronic MEDICAL RECORD NUMBER Nursing notes reviewed and incorporated, Old chart reviewed, Notes from prior ED visits and [place] Controlled  Substance Database         Patient here requesting outpatient alcohol withdrawal treatment and a Librium taper.  He denies SI or HI.  He states he does not want inpatient treatment.  He would like to go home as Librium has worked well for him in the past on outpatient basis.  Will provide him with Librium taper and resources.  He is requesting something now.  Will give 2 mg of oral Ativan.  Was initially tachycardic but this improved even prior to getting benzodiazepines.  On my exam patient is very well-appearing.  His vitals are normal.  He is not diaphoretic, vomiting or tremulous.  Is also requesting wound care referral to small skin ulcer to the bottom of the left heel without signs of superimposed infection.  I have placed referral.  He also has a PCP for follow-up.  I do not feel he needs antibiotics at this time.  At this time, I do not feel there is any life-threatening condition present. I have reviewed, interpreted and discussed all results (EKG, imaging, lab, urine as appropriate) and exam findings with patient/family. I have reviewed nursing notes and appropriate previous records.  I feel the patient is safe to be discharged home without further emergent workup and can continue workup as an outpatient as needed. Discussed usual and customary return precautions. Patient/family verbalize understanding and are comfortable with this plan.  Outpatient follow-up has been provided as needed. All questions have been answered.  ____________________________________________   FINAL CLINICAL IMPRESSION(S) / ED DIAGNOSES  Final diagnoses:  Alcohol abuse  Skin ulcer of heel, limited to breakdown of skin, unspecified laterality [facility]     ED Discharge Orders         Ordered    Ambulatory referral to Wound Clinic        09/22/20 0428    chlordiazePOXIDE (LIBRIUM) 25 MG capsule  As directed        09/22/20 0435    thiamine 100 MG tablet  Daily        09/22/20 0435    folic acid (FOLVITE) 1  MG tablet  Daily        09/22/20 0435          *Please note:  DECKLYN HYDER was evaluated in Emergency Department on 09/22/2020 for the symptoms described in the history of present illness. He was evaluated in  the context of the global COVID-19 pandemic, which necessitated consideration that the patient might be at risk for infection with the SARS-CoV-2 virus that causes COVID-19. Institutional protocols and algorithms that pertain to the evaluation of patients at risk for COVID-19 are in a state of rapid change based on information released by regulatory bodies including the CDC and federal and state organizations. These policies and algorithms were followed during the patient's care in the ED.  Some ED evaluations and interventions may be delayed as a result of limited staffing during and the pandemic.*   Note:  This document was prepared using Dragon voice recognition software and may include unintentional dictation errors.   Hollis Oh, Layla Maw, DO 09/22/20 [phone]

## 2020-09-22 NOTE — ED Triage Notes (Addendum)
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 Pt has pressure ulcer on left heel from then that is also bothering him. Pt states he is drinking 8 6oz bottles of wine a day. Pt states a librium taper has worked for him in the past. Pt states he feels as though he already is starting to withdrawal, has generalized malaise.

## 2020-09-27 ENCOUNTER — Ambulatory Visit: Payer: Medicaid Other | Admitting: Internal Medicine

## 2020-09-29 ENCOUNTER — Ambulatory Visit: Payer: Medicaid Other | Admitting: Physician Assistant

## 2020-10-11 ENCOUNTER — Inpatient Hospital Stay
Admission: EM | Admit: 2020-10-11 | Discharge: 2020-10-15 | DRG: 897 | Disposition: A | Discharge status: Home / Self Care | Payer: Medicaid Other | Attending: Internal Medicine | Admitting: Internal Medicine

## 2020-10-11 ENCOUNTER — Other Ambulatory Visit: Payer: Self-pay

## 2020-10-11 ENCOUNTER — Encounter: Payer: Self-pay | Admitting: Emergency Medicine

## 2020-10-11 DIAGNOSIS — E876 Hypokalemia: Secondary | ICD-10-CM | POA: Diagnosis present

## 2020-10-11 DIAGNOSIS — F10239 Alcohol dependence with withdrawal, unspecified: Secondary | ICD-10-CM | POA: Diagnosis present

## 2020-10-11 DIAGNOSIS — Z6841 Body Mass Index (BMI) 40.0 and over, adult: Secondary | ICD-10-CM

## 2020-10-11 DIAGNOSIS — R112 Nausea with vomiting, unspecified: Secondary | ICD-10-CM

## 2020-10-11 DIAGNOSIS — F10231 Alcohol dependence with withdrawal delirium: Principal | ICD-10-CM | POA: Diagnosis present

## 2020-10-11 DIAGNOSIS — Y908 Blood alcohol level of 240 mg/100 ml or more: Secondary | ICD-10-CM | POA: Diagnosis present

## 2020-10-11 DIAGNOSIS — L89620 Pressure ulcer of left heel, unstageable: Secondary | ICD-10-CM | POA: Diagnosis present

## 2020-10-11 DIAGNOSIS — F341 Dysthymic disorder: Secondary | ICD-10-CM

## 2020-10-11 DIAGNOSIS — R1013 Epigastric pain: Secondary | ICD-10-CM

## 2020-10-11 DIAGNOSIS — Z79891 Long term (current) use of opiate analgesic: Secondary | ICD-10-CM | POA: Diagnosis not present

## 2020-10-11 DIAGNOSIS — F10920 Alcohol use, unspecified with intoxication, uncomplicated: Secondary | ICD-10-CM | POA: Diagnosis not present

## 2020-10-11 DIAGNOSIS — R030 Elevated blood-pressure reading, without diagnosis of hypertension: Secondary | ICD-10-CM | POA: Diagnosis not present

## 2020-10-11 DIAGNOSIS — Z79899 Other long term (current) drug therapy: Secondary | ICD-10-CM | POA: Diagnosis not present

## 2020-10-11 DIAGNOSIS — F17210 Nicotine dependence, cigarettes, uncomplicated: Secondary | ICD-10-CM | POA: Diagnosis present

## 2020-10-11 DIAGNOSIS — F419 Anxiety disorder, unspecified: Secondary | ICD-10-CM | POA: Diagnosis present

## 2020-10-11 DIAGNOSIS — F10939 Alcohol use, unspecified with withdrawal, unspecified: Secondary | ICD-10-CM | POA: Diagnosis present

## 2020-10-11 DIAGNOSIS — K219 Gastro-esophageal reflux disease without esophagitis: Secondary | ICD-10-CM | POA: Diagnosis present

## 2020-10-11 DIAGNOSIS — Z8249 Family history of ischemic heart disease and other diseases of the circulatory system: Secondary | ICD-10-CM

## 2020-10-11 DIAGNOSIS — Z720 Tobacco use: Secondary | ICD-10-CM | POA: Diagnosis not present

## 2020-10-11 DIAGNOSIS — F10931 Alcohol use, unspecified with withdrawal delirium: Secondary | ICD-10-CM | POA: Diagnosis present

## 2020-10-11 DIAGNOSIS — F10220 Alcohol dependence with intoxication, uncomplicated: Secondary | ICD-10-CM | POA: Diagnosis present

## 2020-10-11 DIAGNOSIS — F10230 Alcohol dependence with withdrawal, uncomplicated: Secondary | ICD-10-CM

## 2020-10-11 DIAGNOSIS — Z880 Allergy status to penicillin: Secondary | ICD-10-CM | POA: Diagnosis not present

## 2020-10-11 DIAGNOSIS — Z560 Unemployment, unspecified: Secondary | ICD-10-CM

## 2020-10-11 DIAGNOSIS — Z888 Allergy status to other drugs, medicaments and biological substances status: Secondary | ICD-10-CM

## 2020-10-11 DIAGNOSIS — Z20822 Contact with and (suspected) exposure to covid-19: Secondary | ICD-10-CM | POA: Diagnosis present

## 2020-10-11 DIAGNOSIS — E538 Deficiency of other specified B group vitamins: Secondary | ICD-10-CM | POA: Diagnosis present

## 2020-10-11 DIAGNOSIS — Z716 Tobacco abuse counseling: Secondary | ICD-10-CM

## 2020-10-11 DIAGNOSIS — G629 Polyneuropathy, unspecified: Secondary | ICD-10-CM | POA: Diagnosis present

## 2020-10-11 DIAGNOSIS — F10930 Alcohol use, unspecified with withdrawal, uncomplicated: Secondary | ICD-10-CM

## 2020-10-11 DIAGNOSIS — I10 Essential (primary) hypertension: Secondary | ICD-10-CM | POA: Diagnosis present

## 2020-10-11 LAB — COMPREHENSIVE METABOLIC PANEL
ALT: 26 U/L (ref 0–44)
AST: 33 U/L (ref 15–41)
Albumin: 4.1 g/dL (ref 3.5–5.0)
Alkaline Phosphatase: 67 U/L (ref 38–126)
Anion gap: 11 (ref 5–15)
BUN: 10 mg/dL (ref 6–20)
CO2: 21 mmol/L — ABNORMAL LOW (ref 22–32)
Calcium: 8.3 mg/dL — ABNORMAL LOW (ref 8.9–10.3)
Chloride: 107 mmol/L (ref 98–111)
Creatinine, Ser: 1.11 mg/dL (ref 0.61–1.24)
GFR, Estimated: >60 mL/min (ref >60)
Glucose, Bld: 141 mg/dL — ABNORMAL HIGH (ref 70–99)
Potassium: 3.5 mmol/L (ref 3.5–5.1)
Sodium: 139 mmol/L (ref 135–145)
Total Bilirubin: 0.6 mg/dL (ref 0.3–1.2)
Total Protein: 7.5 g/dL (ref 6.5–8.1)

## 2020-10-11 LAB — CBC
HCT: 44.8 % (ref 39.0–52.0)
Hemoglobin: 15.8 g/dL (ref 13.0–17.0)
MCH: 31.3 pg (ref 26.0–34.0)
MCHC: 35.3 g/dL (ref 30.0–36.0)
MCV: 88.7 fL (ref 80.0–100.0)
Platelets: 301 10*3/uL (ref 150–400)
RBC: 5.05 MIL/uL (ref 4.22–5.81)
RDW: 14.6 % (ref 11.5–15.5)
WBC: 9.8 10*3/uL (ref 4.0–10.5)
nRBC: 0 % (ref 0.0–0.2)

## 2020-10-11 LAB — RESP PANEL BY RT-PCR (FLU A&B, COVID) ARPGX2
Influenza A by PCR: NEGATIVE
Influenza B by PCR: NEGATIVE
SARS Coronavirus 2 by RT PCR: NEGATIVE

## 2020-10-11 LAB — ETHANOL: Alcohol, Ethyl (B): 240 mg/dL — ABNORMAL HIGH (ref <10)

## 2020-10-11 MED ORDER — LORAZEPAM 2 MG/ML IJ SOLN
0.0000 mg | Freq: Four times a day (QID) | INTRAMUSCULAR | Status: DC
  Administered 2020-10-11: 2 mg via INTRAVENOUS
  Filled 2020-10-11: qty 1

## 2020-10-11 MED ORDER — GABAPENTIN 300 MG PO CAPS
600.0000 mg | ORAL_CAPSULE | Freq: Three times a day (TID) | ORAL | Status: DC
  Administered 2020-10-12 – 2020-10-15 (×10): 600 mg via ORAL
  Filled 2020-10-11 (×10): qty 2

## 2020-10-11 MED ORDER — FOLIC ACID 1 MG PO TABS
1.0000 mg | ORAL_TABLET | Freq: Every day | ORAL | Status: DC
Start: 2020-10-12 — End: 2020-10-15
  Administered 2020-10-12 – 2020-10-15 (×4): 1 mg via ORAL
  Filled 2020-10-11 (×4): qty 1

## 2020-10-11 MED ORDER — MAGNESIUM OXIDE 400 (241.3 MG) MG PO TABS
400.0000 mg | ORAL_TABLET | Freq: Two times a day (BID) | ORAL | Status: DC
  Administered 2020-10-11 – 2020-10-15 (×8): 400 mg via ORAL
  Filled 2020-10-11 (×7): qty 1

## 2020-10-11 MED ORDER — THIAMINE HCL 100 MG/ML IJ SOLN
100.0000 mg | Freq: Every day | INTRAMUSCULAR | Status: DC
  Administered 2020-10-11 – 2020-10-13 (×2): 100 mg via INTRAVENOUS
  Filled 2020-10-11 (×3): qty 2

## 2020-10-11 MED ORDER — NICOTINE 21 MG/24HR TD PT24
21.0000 mg | MEDICATED_PATCH | Freq: Once | TRANSDERMAL | Status: AC
  Administered 2020-10-11: 21 mg via TRANSDERMAL
  Filled 2020-10-11: qty 1

## 2020-10-11 MED ORDER — MAGNESIUM HYDROXIDE 400 MG/5ML PO SUSP: 30.0000 mL | Freq: Every day | ORAL | Status: DC | PRN

## 2020-10-11 MED ORDER — TRAZODONE HCL 50 MG PO TABS
50.0000 mg | ORAL_TABLET | Freq: Every day | ORAL | Status: DC
  Administered 2020-10-11 – 2020-10-14 (×4): 50 mg via ORAL
  Filled 2020-10-11 (×5): qty 1

## 2020-10-11 MED ORDER — FUROSEMIDE 40 MG PO TABS
40.0000 mg | ORAL_TABLET | Freq: Every day | ORAL | Status: DC
  Administered 2020-10-12 – 2020-10-15 (×4): 40 mg via ORAL
  Filled 2020-10-11 (×4): qty 1

## 2020-10-11 MED ORDER — LORAZEPAM 2 MG PO TABS
0.0000 mg | ORAL_TABLET | Freq: Four times a day (QID) | ORAL | Status: DC
  Administered 2020-10-12: 1 mg via ORAL
  Filled 2020-10-11: qty 1

## 2020-10-11 MED ORDER — VITAMIN B-12 1000 MCG PO TABS
1000.0000 ug | ORAL_TABLET | Freq: Every day | ORAL | Status: DC
  Administered 2020-10-12 – 2020-10-15 (×4): 1000 ug via ORAL
  Filled 2020-10-11 (×5): qty 1

## 2020-10-11 MED ORDER — THIAMINE HCL 100 MG PO TABS
100.0000 mg | ORAL_TABLET | Freq: Every day | ORAL | Status: DC
  Administered 2020-10-12 – 2020-10-15 (×3): 100 mg via ORAL
  Filled 2020-10-11 (×4): qty 1

## 2020-10-11 MED ORDER — LORAZEPAM 2 MG PO TABS
0.0000 mg | ORAL_TABLET | Freq: Two times a day (BID) | ORAL | Status: DC
  Administered 2020-10-14 – 2020-10-15 (×3): 1 mg via ORAL
  Filled 2020-10-11 (×3): qty 1

## 2020-10-11 MED ORDER — ADULT MULTIVITAMIN W/MINERALS CH
1.0000 | ORAL_TABLET | Freq: Every day | ORAL | Status: DC
  Administered 2020-10-12 – 2020-10-15 (×4): 1 via ORAL
  Filled 2020-10-11 (×4): qty 1

## 2020-10-11 MED ORDER — SODIUM CHLORIDE 0.9 % IV SOLN: INTRAVENOUS | Status: DC

## 2020-10-11 MED ORDER — ENOXAPARIN SODIUM 80 MG/0.8ML ~~LOC~~ SOLN
0.5000 mg/kg | SUBCUTANEOUS | Status: DC
  Administered 2020-10-12 – 2020-10-14 (×3): 67.5 mg via SUBCUTANEOUS
  Filled 2020-10-11 (×5): qty 0.8

## 2020-10-11 MED ORDER — MELATONIN 5 MG PO TABS
5.0000 mg | ORAL_TABLET | Freq: Every day | ORAL | Status: DC
Start: 2020-10-11 — End: 2020-10-15
  Administered 2020-10-11 – 2020-10-14 (×4): 5 mg via ORAL
  Filled 2020-10-11 (×3): qty 1

## 2020-10-11 MED ORDER — THIAMINE HCL 100 MG PO TABS
100.0000 mg | ORAL_TABLET | Freq: Every day | ORAL | Status: DC
Start: 2020-10-12 — End: 2020-10-11

## 2020-10-11 MED ORDER — LORAZEPAM 2 MG/ML IJ SOLN: 0.0000 mg | Freq: Two times a day (BID) | INTRAMUSCULAR | Status: DC

## 2020-10-11 MED ORDER — ACETAMINOPHEN 325 MG PO TABS: 650.0000 mg | ORAL_TABLET | Freq: Four times a day (QID) | ORAL | Status: DC | PRN

## 2020-10-11 MED ORDER — ACETAMINOPHEN 650 MG RE SUPP: 650.0000 mg | Freq: Four times a day (QID) | RECTAL | Status: DC | PRN

## 2020-10-11 NOTE — ED Triage Notes (Signed)
Pt to ED from home c/o alcohol problem, needing detox.  States drinks 30oz wine every day which he's had today, relapsed february 3rd.  States has seizures when he withdrawals.  Denies pain at this time.  Used marijuana today but denies other drugs.  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  Denies SI/HI or visual hallucinations, states auditory hallucinations of man and woman arguing.

## 2020-10-11 NOTE — Progress Notes (Signed)
PHARMACIST - PHYSICIAN COMMUNICATION  CONCERNING:  Enoxaparin (Lovenox) for DVT Prophylaxis    RECOMMENDATION: Patient was prescribed enoxaprin 40mg  q24 hours for VTE prophylaxis.   Filed Weights   10/11/20 2038  Weight: 132.5 kg (292 lb)    Body mass index is 41.9 kg/m.  Estimated Creatinine Clearance: 124.8 mL/min (by C-G formula based on SCr of 1.11 mg/dL).   Based on [facility] policy patient is candidate for enoxaparin 0.5mg /kg TBW SQ every 24 hours based on BMI being >30.  DESCRIPTION: Pharmacy has adjusted enoxaparin dose per [facility] policy.  Patient is now receiving enoxaparin 0.5 mg/kg every 24 hours   [facility], PharmD, [facility] 10/11/2020 10:51 PM

## 2020-10-11 NOTE — ED Provider Notes (Signed)
[facility] Emergency Department Provider Note   ____________________________________________   Event Date/Time   First MD Initiated Contact with Patient 10/11/20 2242     (approximate)  I have reviewed the triage vital signs and the nursing notes.   HISTORY  Chief Complaint Alcohol Problem    HPI Nicholas Valdez is a 40 y.o. male with stated past medical history of significant alcohol abuse with significantly complicated withdrawals including a needing to be intubated, trached, and had a PEG tube inserted in December of last year.  Patient states that he had some personal stressors that caused him to start drinking again approximately 1 month ago and now presents for resources for alcohol detoxification.  Patient endorses some palpitations but denies any other complaints at this time.  Patient states last drink was just prior to arrival patient currently denies any vision changes, tinnitus, difficulty speaking, facial droop, sore throat, chest pain, shortness of breath, abdominal pain, nausea/vomiting/diarrhea, dysuria, or weakness/numbness/paresthesias in any extremity         Past Medical History:  Diagnosis Date  . Alcohol abuse   . Anxiety   . Depression   . Drug abuse (HCC)   . GERD (gastroesophageal reflux disease)   . Hepatitis C   . Hypertension   . Pneumonia   . Pre-diabetes   . Sleep apnea     Patient Active Problem List   Diagnosis Date Noted  . Dysphagia   . Hypoalbuminemia due to protein-calorie malnutrition (HCC)   . Left foot pain   . Peripheral edema   . Impulsive   . Acute encephalopathy   . Hyperammonemia (HCC)   . Morbid obesity (HCC)   . Sleep disturbance   . Acute on chronic anemia   . Status post tracheostomy (HCC)   . S/P percutaneous endoscopic gastrostomy (PEG) tube placement (HCC)   . Pressure injury of skin 07/25/2020  . Acute respiratory failure with hypoxia (HCC)   . Elevated lactic acid level 06/20/2020   . Alcohol withdrawal delirium (HCC) 06/19/2020  . Increased ammonia level 06/19/2020  . Acute metabolic encephalopathy 06/19/2020  . Elevated LFTs 06/19/2020  . Tobacco abuse 06/19/2020  . Alcohol withdrawal (HCC) 12/15/2018    Past Surgical History:  Procedure Laterality Date  . GASTROSTOMY W/ FEEDING TUBE Left 07/22/2020  . TRACHEOSTOMY TUBE PLACEMENT N/A 07/10/2020   Procedure: TRACHEOSTOMY;  Surgeon: Bud Face, MD;  Location: [facility] ORS;  Service: ENT;  Laterality: N/A;    Prior to Admission medications   Medication Sig Start Date End Date Taking? Authorizing Provider  chlordiazePOXIDE (LIBRIUM) 25 MG capsule Take 1 capsule (25 mg total) by mouth as directed. Take 50 mg every 6 hours as needed for day 1, then 25 mg every 6 hours as needed for day 2-5 09/22/20   Ward, Layla Maw, DO  cyanocobalamin 1000 MCG tablet Take 1 tablet (1,000 mcg total) by mouth daily. 08/09/20   Angiulli, Mcarthur Rossetti, PA-C  folic acid (FOLVITE) 1 MG tablet Take 1 tablet (1 mg total) by mouth daily. 09/22/20 09/22/21  Ward, Layla Maw, DO  furosemide (LASIX) 40 MG tablet Take 1 tablet (40 mg total) by mouth daily. 08/10/20   Angiulli, Mcarthur Rossetti, PA-C  gabapentin (NEURONTIN) 300 MG capsule Take 2 capsules (600 mg total) by mouth every 8 (eight) hours. 08/09/20   Angiulli, Mcarthur Rossetti, PA-C  lidocaine (LIDODERM) 5 % Place 1 patch onto the skin daily. Remove & Discard patch within 12 hours or as directed by  MD 08/10/20   Charlton Amor, PA-C  magnesium oxide (MAG-OX) 400 (241.3 Mg) MG tablet Take 1 tablet (400 mg total) by mouth 2 (two) times daily. 08/09/20   Angiulli, Mcarthur Rossetti, PA-C  melatonin 5 MG TABS Take 1 tablet (5 mg total) by mouth at bedtime. 08/09/20   Angiulli, Mcarthur Rossetti, PA-C  Multiple Vitamin (MULTIVITAMIN WITH MINERALS) TABS tablet Take 1 tablet by mouth daily. 08/05/20   Burnadette Pop, MD  thiamine 100 MG tablet Take 1 tablet (100 mg total) by mouth daily. 09/22/20   Ward, Layla Maw, DO  traZODone (DESYREL) 50 MG  tablet Take 1 tablet (50 mg total) by mouth at bedtime. 08/09/20   Angiulli, Mcarthur Rossetti, PA-C    Allergies Penicillins and Zofran [ondansetron]  Family History  Problem Relation Age of Onset  . Hypertension Other     Social History Social History   Tobacco Use  . Smoking status: Current Every Day Smoker    Packs/day: 1.00    Types: Cigarettes  . Smokeless tobacco: Never Used  Vaping Use  . Vaping Use: Former  Substance Use Topics  . Alcohol use: Yes    Comment: drinks 30oz wine a day  . Drug use: Yes    Types: Marijuana    Review of Systems Constitutional: No fever/chills Eyes: No visual changes. ENT: No sore throat. Cardiovascular: Denies chest pain. Respiratory: Denies shortness of breath. Gastrointestinal: No abdominal pain.  No nausea, no vomiting.  No diarrhea. Genitourinary: Negative for dysuria. Musculoskeletal: Negative for acute arthralgias Skin: Negative for rash. Neurological: Negative for headaches, weakness/numbness/paresthesias in any extremity Psychiatric: Negative for suicidal ideation/homicidal ideation   ____________________________________________   PHYSICAL EXAM:  VITAL SIGNS: ED Triage Vitals  Enc Vitals Group     BP 10/11/20 2036 (!) 130/99     Pulse Rate 10/11/20 2036 (!) 133     Resp 10/11/20 2036 18     Temp 10/11/20 2036 98.7 F (37.1 C)     Temp Source 10/11/20 2036 Oral     SpO2 10/11/20 2036 99 %     Weight 10/11/20 2038 292 lb (132.5 kg)     Height 10/11/20 2038 5\' 10"  (1.778 m)     Head Circumference --      Peak Flow --      Pain Score 10/11/20 2037 0     Pain Loc --      Pain Edu? --      Excl. in GC? --    Constitutional: Alert and oriented. Well appearing and in no acute distress. Eyes: Conjunctivae are normal. PERRL. Head: Atraumatic. Nose: No congestion/rhinnorhea. Mouth/Throat: Mucous membranes are moist. Neck: No stridor Cardiovascular: Grossly normal heart sounds.  Good peripheral circulation. Respiratory:  Normal respiratory effort.  No retractions. Gastrointestinal: Soft and nontender. No distention. Musculoskeletal: No obvious deformities Neurologic:  Normal speech and language. No gross focal neurologic deficits are appreciated. Skin:  Skin is warm and dry. No rash noted. Psychiatric: Mood and affect are normal. Speech and behavior are normal.  ____________________________________________   LABS (all labs ordered are listed, but only abnormal results are displayed)  Labs Reviewed  COMPREHENSIVE METABOLIC PANEL - Abnormal; Notable for the following components:      Result Value   CO2 21 (*)    Glucose, Bld 141 (*)    Calcium 8.3 (*)    All other components within normal limits  ETHANOL - Abnormal; Notable for the following components:   Alcohol, Ethyl (B) 240 (*)  All other components within normal limits  RESP PANEL BY RT-PCR (FLU A&B, COVID) ARPGX2  CBC  URINE DRUG SCREEN, QUALITATIVE ([facility] ONLY)  BASIC METABOLIC PANEL  CBC   PROCEDURES  Procedure(s) performed (including Critical Care):  Procedures   ____________________________________________   INITIAL IMPRESSION / ASSESSMENT AND PLAN / ED COURSE  As part of my medical decision making, I reviewed the following data within the electronic MEDICAL RECORD NUMBER Nursing notes reviewed and incorporated, Labs reviewed, Old chart reviewed, and Notes from prior ED visits reviewed and incorporated        Male with a complicated history of alcohol withdrawal who requests stating that he is trying to detox from alcohol.  Patient's alcohol here is 240 however patient's mentation is normal.  Patient is also tachycardic to 133 concerning for mild alcohol withdrawal at this time.  Patient placed on CIWA protocol and referred for admission given his complicated alcohol withdrawal history.  Dispo: Admit to medicine      ____________________________________________   FINAL CLINICAL IMPRESSION(S) / ED DIAGNOSES  Final  diagnoses:  Alcoholic intoxication without complication (HCC)  Alcohol withdrawal syndrome without complication [facility])     ED Discharge Orders    None       Note:  This document was prepared using Dragon voice recognition software and may include unintentional dictation errors.   Merwyn Katos, MD 10/11/20 2325

## 2020-10-11 NOTE — H&P (Signed)
[place]   PATIENT NAME: Nicholas Valdez    MR#:  [id number]  DATE OF BIRTH:  1981-04-16  DATE OF ADMISSION:  10/11/2020  PRIMARY CARE PHYSICIAN: Oswaldo Conroy, MD   Patient is coming from: Home  REQUESTING/REFERRING PHYSICIAN: Merwyn Katos, MD CHIEF COMPLAINT:   Chief Complaint  Patient presents with  . Alcohol Problem    HISTORY OF PRESENT ILLNESS:  Nicholas Valdez is a 40 y.o. male with medical history significant for alcohol use, depression, GERD, hepatitis C, hypertension, prediabetes and sleep apnea who presented to the emergency room with suspected alcohol withdrawal for detoxification.  No chest pain or dyspnea or palpitations.  No cough or wheezing.   He had nausea and vomiting before he came to the ER with associated epigastric pain.  He admits to headache without dizziness or blurred vision.  He had tactile disturbances.  No dysuria, oliguria or hematuria or flank pain.  His last alcoholic drink was today.  He was fairly somnolent during my interview after being given 2 mg of IV Ativan but was arousable.  ED Course: Heart rate was 133 with a blood pressure of 130/99 and otherwise normal vital signs.  Labs revealed borderline potassium of 3.5 and a blood glucose of 141 with otherwise unremarkable CMP.  CBC was within normal.  Alcohol level was 240.  Respiratory panel is currently pending. Imaging: None.  The patient was placed on CIWA protocol and was given a milligram of thiamine as well as NicoDerm CQ patch.  He will be admitted to a medical monitored bed for further evaluation and management. PAST MEDICAL HISTORY:   Past Medical History:  Diagnosis Date  . Alcohol abuse   . Anxiety   . Depression   . Drug abuse (HCC)   . GERD (gastroesophageal reflux disease)   . Hepatitis C   . Hypertension   . Pneumonia   . Pre-diabetes   . Sleep apnea     PAST SURGICAL HISTORY:   Past Surgical History:  Procedure Laterality Date  . GASTROSTOMY W/  FEEDING TUBE Left 07/22/2020  . TRACHEOSTOMY TUBE PLACEMENT N/A 07/10/2020   Procedure: TRACHEOSTOMY;  Surgeon: Bud Face, MD;  Location: [facility] ORS;  Service: ENT;  Laterality: N/A;    SOCIAL HISTORY:   Social History   Tobacco Use  . Smoking status: Current Every Day Smoker    Packs/day: 1.00    Types: Cigarettes  . Smokeless tobacco: Never Used  Substance Use Topics  . Alcohol use: Yes    Comment: drinks 30oz wine a day    FAMILY HISTORY:   Family History  Problem Relation Age of Onset  . Hypertension Other     DRUG ALLERGIES:   Allergies  Allergen Reactions  . Penicillins Hives    Did it involve swelling of the face/tongue/throat, SOB, or low BP? Yes Did it involve sudden or severe rash/hives, skin peeling, or any reaction on the inside of your mouth or nose? Yes Did you need to seek medical attention at a hospital or doctor's office? Yes When did it last happen?childhood If all above answers are "NO", may proceed with cephalosporin use.  Patient tolerated Ancef administration well  . Zofran [Ondansetron] Hives    REVIEW OF SYSTEMS:   ROS As per history of present illness. All pertinent systems were reviewed above. Constitutional, HEENT, cardiovascular, respiratory, GI, GU, musculoskeletal, neuro, psychiatric, endocrine, integumentary and hematologic systems were reviewed and are otherwise negative/unremarkable except for positive findings  mentioned above in the HPI.   MEDICATIONS AT HOME:   Prior to Admission medications   Medication Sig Start Date End Date Taking? Authorizing Provider  chlordiazePOXIDE (LIBRIUM) 25 MG capsule Take 1 capsule (25 mg total) by mouth as directed. Take 50 mg every 6 hours as needed for day 1, then 25 mg every 6 hours as needed for day 2-5 09/22/20   Ward, Layla Maw, DO  cyanocobalamin 1000 MCG tablet Take 1 tablet (1,000 mcg total) by mouth daily. 08/09/20   Angiulli, Mcarthur Rossetti, PA-C  folic acid (FOLVITE) 1 MG tablet Take  1 tablet (1 mg total) by mouth daily. 09/22/20 09/22/21  Ward, Layla Maw, DO  furosemide (LASIX) 40 MG tablet Take 1 tablet (40 mg total) by mouth daily. 08/10/20   Angiulli, Mcarthur Rossetti, PA-C  gabapentin (NEURONTIN) 300 MG capsule Take 2 capsules (600 mg total) by mouth every 8 (eight) hours. 08/09/20   Angiulli, Mcarthur Rossetti, PA-C  lidocaine (LIDODERM) 5 % Place 1 patch onto the skin daily. Remove & Discard patch within 12 hours or as directed by MD 08/10/20   Angiulli, Mcarthur Rossetti, PA-C  magnesium oxide (MAG-OX) 400 (241.3 Mg) MG tablet Take 1 tablet (400 mg total) by mouth 2 (two) times daily. 08/09/20   Angiulli, Mcarthur Rossetti, PA-C  melatonin 5 MG TABS Take 1 tablet (5 mg total) by mouth at bedtime. 08/09/20   Angiulli, Mcarthur Rossetti, PA-C  Multiple Vitamin (MULTIVITAMIN WITH MINERALS) TABS tablet Take 1 tablet by mouth daily. 08/05/20   Burnadette Pop, MD  thiamine 100 MG tablet Take 1 tablet (100 mg total) by mouth daily. 09/22/20   Ward, Layla Maw, DO  traZODone (DESYREL) 50 MG tablet Take 1 tablet (50 mg total) by mouth at bedtime. 08/09/20   Angiulli, Mcarthur Rossetti, PA-C      VITAL SIGNS:  Blood pressure (!) 130/99, pulse (!) 133, temperature 98.7 F (37.1 C), temperature source Oral, resp. rate 18, height 5\' 10"  (1.778 m), weight 132.5 kg, SpO2 99 %.  PHYSICAL EXAMINATION:  Physical Exam  GENERAL:  40 y.o.-year-old Caucasian male patient lying in the bed with no acute distress.  He was fairly somnolent but arousable.  He answered most of the questions appropriately. EYES: Pupils equal, round, reactive to light and accommodation. No scleral icterus. Extraocular muscles intact.  HEENT: Head atraumatic, normocephalic. Oropharynx and nasopharynx clear.  NECK:  Supple, no jugular venous distention. No thyroid enlargement, no tenderness.  LUNGS: Normal breath sounds bilaterally, no wheezing, rales,rhonchi or crepitation. No use of accessory muscles of respiration.  CARDIOVASCULAR: Regular rate and rhythm, S1, S2 normal. No  murmurs, rubs, or gallops.  ABDOMEN: Soft, nondistended, with epigastric tenderness without rebound tenderness guarding or rigidity.  Bowel sounds present. No organomegaly or mass.  EXTREMITIES: No pedal edema, cyanosis, or clubbing.  NEUROLOGIC: Cranial nerves II through XII are intact. Muscle strength 5/5 in all extremities. Sensation intact. Gait not checked.  PSYCHIATRIC: The patient is alert and oriented x 3.  Normal affect and good eye contact. SKIN: No obvious rash, lesion, or ulcer.   LABORATORY PANEL:   CBC Recent Labs  Lab 10/11/20 2042  WBC 9.8  HGB 15.8  HCT 44.8  PLT 301   ------------------------------------------------------------------------------------------------------------------  Chemistries  Recent Labs  Lab 10/11/20 2042  NA 139  K 3.5  CL 107  CO2 21*  GLUCOSE 141*  BUN 10  CREATININE 1.11  CALCIUM 8.3*  AST 33  ALT 26  ALKPHOS 67  BILITOT 0.6   ------------------------------------------------------------------------------------------------------------------  Cardiac Enzymes No results for input(s): TROPONINI in the last 168 hours. ------------------------------------------------------------------------------------------------------------------  RADIOLOGY:  No results found.    IMPRESSION AND PLAN:  Active Problems:   Alcohol withdrawal (HCC)  1.  Alcohol intoxication with possibly associated alcohol withdrawal given likely significant amount of alcohol being consumed. -The patient will be admitted to a medical monitored bed for further evaluation and management. -We will place him on continued CIWA protocol for alcohol withdrawal. -He will be placed on thiamine, folic acid and multivitamins. -We will start him on p.o. Librium. -Psychiatry consult will be obtained. -I notified Dr. Toni Amend about the patient.  2.  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 -We will place him on IV  Protonix. -As needed antiemetics will be provided.  3.  Ongoing tobacco abuse. -He will be continued on NicoDerm CQ patch. -I counseled him for smoking cessation and he will receive further counseling here.  4.  Elevated blood pressure and tachycardia likely secondary to alcohol withdrawal. -We will monitor his blood pressure and heart rate while he is here. -He will be hydrated with IV normal saline.  5.  History of anxiety and depression. -We will continue Lexapro. -He will be on as needed IV Ativan per CIWA protocol. -Psychiatry consult will be obtained as mentioned above.  6.  Peripheral neuropathy. -We will continue Neurontin.  7.  Vitamin B12 deficiency. -We will continue vitamin B12.  DVT prophylaxis: Lovenox. Code Status: full code. Family Communication:  The plan of care was discussed in details with the patient (and family). I answered all questions. The patient agreed to proceed with the above mentioned plan. Further management will depend upon hospital course. Disposition Plan: Back to previous home environment Consults called: Psychiatry consult.  All the records are reviewed and case discussed with ED provider.  Status is: Inpatient  Remains inpatient appropriate because:Ongoing diagnostic testing needed not appropriate for outpatient work up, Unsafe d/c plan, IV treatments appropriate due to intensity of illness or inability to take PO and Inpatient level of care appropriate due to severity of illness   Dispo: The patient is from: Home              Anticipated d/c is to: Home              Patient currently is not medically stable to d/c.   Difficult to place patient No  TOTAL TIME TAKING CARE OF THIS PATIENT: 55 minutes.    Hannah Beat M.D on 10/11/2020 at 10:59 PM  [facility]   From 7 PM-7 AM, contact night-coverage [email or web address]  CC: Primary care physician; Oswaldo Conroy, MD

## 2020-10-11 NOTE — ED Notes (Signed)
Pt reports pressure ulcer on left foot following admission in alcoholic coma on third of this month. Pt requesting socks to place on feet when removing shoes as he does not have any socks on at this time. Wound appears to be scabbed over and about 1 inch in diamater

## 2020-10-12 DIAGNOSIS — Z720 Tobacco use: Secondary | ICD-10-CM

## 2020-10-12 DIAGNOSIS — F10920 Alcohol use, unspecified with intoxication, uncomplicated: Secondary | ICD-10-CM

## 2020-10-12 DIAGNOSIS — F10230 Alcohol dependence with withdrawal, uncomplicated: Secondary | ICD-10-CM

## 2020-10-12 LAB — BASIC METABOLIC PANEL
Anion gap: 7 (ref 5–15)
BUN: 10 mg/dL (ref 6–20)
CO2: 25 mmol/L (ref 22–32)
Calcium: 7.9 mg/dL — ABNORMAL LOW (ref 8.9–10.3)
Chloride: 109 mmol/L (ref 98–111)
Creatinine, Ser: 0.7 mg/dL (ref 0.61–1.24)
GFR, Estimated: >60 mL/min (ref >60)
Glucose, Bld: 107 mg/dL — ABNORMAL HIGH (ref 70–99)
Potassium: 3.2 mmol/L — ABNORMAL LOW (ref 3.5–5.1)
Sodium: 141 mmol/L (ref 135–145)

## 2020-10-12 LAB — CBC
HCT: 39.2 % (ref 39.0–52.0)
Hemoglobin: 13.3 g/dL (ref 13.0–17.0)
MCH: 30.6 pg (ref 26.0–34.0)
MCHC: 33.9 g/dL (ref 30.0–36.0)
MCV: 90.1 fL (ref 80.0–100.0)
Platelets: 207 10*3/uL (ref 150–400)
RBC: 4.35 MIL/uL (ref 4.22–5.81)
RDW: 14.3 % (ref 11.5–15.5)
WBC: 7.8 10*3/uL (ref 4.0–10.5)
nRBC: 0 % (ref 0.0–0.2)

## 2020-10-12 LAB — MAGNESIUM: Magnesium: 2 mg/dL (ref 1.7–2.4)

## 2020-10-12 MED ORDER — LORAZEPAM 2 MG/ML IJ SOLN: 0.0000 mg | Freq: Four times a day (QID) | INTRAMUSCULAR | Status: AC

## 2020-10-12 MED ORDER — CALCIUM CARBONATE 1250 (500 CA) MG PO TABS
500.0000 mg | ORAL_TABLET | Freq: Two times a day (BID) | ORAL | Status: DC
  Administered 2020-10-12 – 2020-10-14 (×5): 500 mg via ORAL
  Filled 2020-10-12 (×7): qty 1

## 2020-10-12 MED ORDER — POTASSIUM CHLORIDE CRYS ER 20 MEQ PO TBCR
40.0000 meq | EXTENDED_RELEASE_TABLET | Freq: Once | ORAL | Status: AC
  Administered 2020-10-12: 40 meq via ORAL
  Filled 2020-10-12: qty 2

## 2020-10-12 MED ORDER — LORAZEPAM 2 MG PO TABS
0.0000 mg | ORAL_TABLET | Freq: Four times a day (QID) | ORAL | Status: AC
  Administered 2020-10-12 – 2020-10-13 (×4): 1 mg via ORAL
  Filled 2020-10-12 (×4): qty 1

## 2020-10-12 NOTE — Consult Note (Signed)
[facility] Face-to-Face Psychiatry Consult   Reason for Consult:Alcohol Problem Referring Physician: Dr. Vicente Males Patient Identification: Nicholas Valdez MRN:  [id number] Principal Diagnosis: <principal problem not specified> Diagnosis:  Active Problems:   Alcohol withdrawal (HCC)   Alcohol withdrawal delirium (HCC)   Tobacco abuse   Total Time spent with patient: 20 minutes  Subjective: " I am not suicidal." Nicholas Valdez is a 40 y.o. male patient presented to [facility] ED via POV voluntarily.  Per the ED triage nurse note, the pt to ED from home c/o alcohol problem, needing detox.  States drinks 30oz wine every day which he's had today, relapsed february 3rd.  States has seizures when he withdrawals.  Denies pain at this time.  Used marijuana today but denies other drugs.  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  Denies SI/HI or visual hallucinations, states auditory hallucinations of man and woman arguing. During the patient assessment he is lethargic therefore making it difficult to get clear understanding as to why he is here. The patient, admits to auditory hallucinations and states he has always heard voices even when he is sober.  The patient states his auditory hallucinations tend to be worse when he is intoxicated.  The patient was seen face-to-face by this provider; the chart was reviewed and consulted with Dr. Derrill Kay on 10/11/2020 due to the patient's care. It was discussed with the EDP that the patient does not meet the criteria to be admitted to the psychiatric inpatient unit.  On evaluation, the patient is alert and oriented x 3, lethargic, minimally cooperative with his answers, and mood-congruent with affect.  The patient does not appear to be responding to internal or external stimuli. Neither is the patient presenting with any delusional thinking. The patient admits to auditory hallucinations but denies visual hallucinations. The patient denies any suicidal,  homicidal, or self-harm ideations. The patient is not presenting with any psychotic or paranoid behaviors. During an encounter with the patient, he answered some questions appropriately.  The patient is currently being admitted to the medical floor for further care.  HPI: Per Dr. Vicente Males, Nicholas Valdez is a 40 y.o. male with stated past medical history of significant alcohol abuse with significantly complicated withdrawals including a needing to be intubated, trached, and had a PEG tube inserted in December of last year.  Patient states that he had some personal stressors that caused him to start drinking again approximately 1 month ago and now presents for resources for alcohol detoxification.  Patient endorses some palpitations but denies any other complaints at this time.  Patient states last drink was just prior to arrival patient currently denies any vision changes, tinnitus, difficulty speaking, facial droop, sore throat, chest pain, shortness of breath, abdominal pain, nausea/vomiting/diarrhea, dysuria, or weakness/numbness/paresthesias in any extremity     Past Psychiatric History:  Alcohol abuse Anxiety Depression Drug abuse (HCC)  Risk to Self:  No Risk to Others:  No Prior Inpatient Therapy:  No Prior Outpatient Therapy:  Yes  Past Medical History:  Past Medical History:  Diagnosis Date  . Alcohol abuse   . Anxiety   . Depression   . Drug abuse (HCC)   . GERD (gastroesophageal reflux disease)   . Hepatitis C   . Hypertension   . Pneumonia   . Pre-diabetes   . Sleep apnea     Past Surgical History:  Procedure Laterality Date  . GASTROSTOMY W/ FEEDING TUBE Left 07/22/2020  . TRACHEOSTOMY TUBE PLACEMENT  N/A 07/10/2020   Procedure: TRACHEOSTOMY;  Surgeon: Bud Face, MD;  Location: [facility] ORS;  Service: ENT;  Laterality: N/A;   Family History:  Family History  Problem Relation Age of Onset  . Hypertension Other    Family Psychiatric  History:  Social History:   Social History   Substance and Sexual Activity  Alcohol Use Yes   Comment: drinks 30oz wine a day     Social History   Substance and Sexual Activity  Drug Use Yes  . Types: Marijuana    Social History   Socioeconomic History  . Marital status: Single    Spouse name: Not on file  . Number of children: Not on file  . Years of education: Not on file  . Highest education level: Not on file  Occupational History  . Occupation: unemployed  Tobacco Use  . Smoking status: Current Every Day Smoker    Packs/day: 1.00    Types: Cigarettes  . Smokeless tobacco: Never Used  Vaping Use  . Vaping Use: Former  Substance and Sexual Activity  . Alcohol use: Yes    Comment: drinks 30oz wine a day  . Drug use: Yes    Types: Marijuana  . Sexual activity: Yes    Birth control/protection: Condom  Other Topics Concern  . Not on file  Social History Narrative  . Not on file   Social Determinants of Health   Financial Resource Strain: Not on file  Food Insecurity: Not on file  Transportation Needs: Not on file  Physical Activity: Not on file  Stress: Not on file  Social Connections: Not on file   Additional Social History:    Allergies:   Allergies  Allergen Reactions  . Penicillins Hives    Did it involve swelling of the face/tongue/throat, SOB, or low BP? Yes Did it involve sudden or severe rash/hives, skin peeling, or any reaction on the inside of your mouth or nose? Yes Did you need to seek medical attention at a hospital or doctor's office? Yes When did it last happen?childhood If all above answers are "NO", may proceed with cephalosporin use.  Patient tolerated Ancef administration well  . Zofran [Ondansetron] Hives    Labs:  Results for orders placed or performed during the hospital encounter of 10/11/20 (from the past 48 hour(s))  Comprehensive metabolic panel     Status: Abnormal   Collection Time: 10/11/20  8:42 PM  Result Value Ref Range   Sodium 139  135 - 145 mmol/L   Potassium 3.5 3.5 - 5.1 mmol/L   Chloride 107 98 - 111 mmol/L   CO2 21 (L) 22 - 32 mmol/L   Glucose, Bld 141 (H) 70 - 99 mg/dL    Comment: Glucose reference range applies only to samples taken after fasting for at least 8 hours.   BUN 10 6 - 20 mg/dL   Creatinine, Ser 5.36 0.61 - 1.24 mg/dL   Calcium 8.3 (L) 8.9 - 10.3 mg/dL   Total Protein 7.5 6.5 - 8.1 g/dL   Albumin 4.1 3.5 - 5.0 g/dL   AST 33 15 - 41 U/L   ALT 26 0 - 44 U/L   Alkaline Phosphatase 67 38 - 126 U/L   Total Bilirubin 0.6 0.3 - 1.2 mg/dL   GFR, Estimated >64 >40 mL/min    Comment: (NOTE) Calculated using the CKD-EPI Creatinine Equation (2021)    Anion gap 11 5 - 15    Comment: Performed at [facility], [address] [place], [place] [address]  Ethanol     Status: Abnormal   Collection Time: 10/11/20  8:42 PM  Result Value Ref Range   Alcohol, Ethyl (B) 240 (H) <10 mg/dL    Comment: (NOTE) Lowest detectable limit for serum alcohol is 10 mg/dL.  For medical purposes only. Performed at [facility], [address] [place], [place] [address]   cbc     Status: None   Collection Time: 10/11/20  8:42 PM  Result Value Ref Range   WBC 9.8 4.0 - 10.5 K/uL   RBC 5.05 4.22 - 5.81 MIL/uL   Hemoglobin 15.8 13.0 - 17.0 g/dL   HCT 09.844.8 11.939.0 - 14.752.0 %   MCV 88.7 80.0 - 100.0 fL   MCH 31.3 26.0 - 34.0 pg   MCHC 35.3 30.0 - 36.0 g/dL   RDW 82.914.6 56.211.5 - 13.015.5 %   Platelets 301 150 - 400 K/uL   nRBC 0.0 0.0 - 0.2 %    Comment: Performed at [facility], [address] [place], [place] [address]  Resp Panel by RT-PCR (Flu A&B, Covid) Nasopharyngeal Swab     Status: None   Collection Time: 10/11/20 10:46 PM   Specimen: Nasopharyngeal Swab; Nasopharyngeal(NP) swabs in vial transport medium  Result Value Ref Range   SARS Coronavirus 2 by RT PCR NEGATIVE NEGATIVE    Comment: (NOTE) SARS-CoV-2 target nucleic acids are NOT DETECTED.  The SARS-CoV-2 RNA is generally  detectable in upper respiratory specimens during the acute phase of infection. The lowest concentration of SARS-CoV-2 viral copies this assay can detect is 138 copies/mL. A negative result does not preclude SARS-Cov-2 infection and should not be used as the sole basis for treatment or other patient management decisions. A negative result may occur with  improper specimen collection/handling, submission of specimen other than nasopharyngeal swab, presence of viral mutation(s) within the areas targeted by this assay, and inadequate number of viral copies(<138 copies/mL). A negative result must be combined with clinical observations, patient history, and epidemiological information. The expected result is Negative.  Fact Sheet for Patients:  BloggerCourse.comhttps://www.fda.gov/media/152166/download  Fact Sheet for Healthcare Providers:  SeriousBroker.ithttps://www.fda.gov/media/152162/download  This test is no t yet approved or cleared by the [place] FDA and  has been authorized for detection and/or diagnosis of SARS-CoV-2 by FDA under an Emergency Use Authorization (EUA). This EUA will remain  in effect (meaning this test can be used) for the duration of the COVID-19 declaration under Section 564(b)(1) of the Act, 21 U.S.C.section 360bbb-3(b)(1), unless the authorization is terminated  or revoked sooner.       Influenza A by PCR NEGATIVE NEGATIVE   Influenza B by PCR NEGATIVE NEGATIVE    Comment: (NOTE) The Xpert Xpress SARS-CoV-2/FLU/RSV plus assay is intended as an aid in the diagnosis of influenza from Nasopharyngeal swab specimens and should not be used as a sole basis for treatment. Nasal washings and aspirates are unacceptable for Xpert Xpress SARS-CoV-2/FLU/RSV testing.  Fact Sheet for Patients: BloggerCourse.comhttps://www.fda.gov/media/152166/download  Fact Sheet for Healthcare Providers: SeriousBroker.ithttps://www.fda.gov/media/152162/download  This test is not yet approved or cleared by the [place] FDA and has  been authorized for detection and/or diagnosis of SARS-CoV-2 by FDA under an Emergency Use Authorization (EUA). This EUA will remain in effect (meaning this test can be used) for the duration of the COVID-19 declaration under Section 564(b)(1) of the Act, 21 U.S.C. section 360bbb-3(b)(1), unless the authorization is terminated or revoked.  Performed at [facility], [address]  [place], [place] [address]     Current Facility-Administered Medications  Medication Dose Route Frequency Provider Last Rate Last Admin  . 0.9 %  sodium chloride infusion   Intravenous Continuous Mansy, Vernetta Honey, MD 75 mL/hr at 10/12/20 0145 New Bag at 10/12/20 0145  . acetaminophen (TYLENOL) tablet 650 mg  650 mg Oral Q6H PRN Mansy, Jan A, MD       Or  . acetaminophen (TYLENOL) suppository 650 mg  650 mg Rectal Q6H PRN Mansy, Jan A, MD      . enoxaparin (LOVENOX) injection 67.5 mg  0.5 mg/kg Subcutaneous Q24H Mansy, Jan A, MD      . folic acid (FOLVITE) tablet 1 mg  1 mg Oral Daily Mansy, Jan A, MD      . furosemide (LASIX) tablet 40 mg  40 mg Oral Daily Mansy, Jan A, MD      . gabapentin (NEURONTIN) capsule 600 mg  600 mg Oral Q8H Mansy, Jan A, MD      . LORazepam (ATIVAN) injection 0-4 mg  0-4 mg Intravenous Q6H Merwyn Katos, MD   2 mg at 10/11/20 2158   Or  . LORazepam (ATIVAN) tablet 0-4 mg  0-4 mg Oral Q6H Merwyn Katos, MD      . Melene Muller ON 10/14/2020] LORazepam (ATIVAN) injection 0-4 mg  0-4 mg Intravenous Q12H Merwyn Katos, MD       Or  . Melene Muller ON 10/14/2020] LORazepam (ATIVAN) tablet 0-4 mg  0-4 mg Oral Q12H Merwyn Katos, MD      . magnesium hydroxide (MILK OF MAGNESIA) suspension 30 mL  30 mL Oral Daily PRN Mansy, Jan A, MD      . magnesium oxide (MAG-OX) tablet 400 mg  400 mg Oral BID Mansy, Jan A, MD   400 mg at 10/11/20 2347  . melatonin tablet 5 mg  5 mg Oral QHS Mansy, Jan A, MD   5 mg at 10/11/20 2347  . multivitamin with minerals tablet 1 tablet  1 tablet Oral Daily Mansy,  Jan A, MD      . nicotine (NICODERM CQ - dosed in mg/24 hours) patch 21 mg  21 mg Transdermal Once Merwyn Katos, MD   21 mg at 10/11/20 2158  . thiamine tablet 100 mg  100 mg Oral Daily Merwyn Katos, MD       Or  . thiamine (B-1) injection 100 mg  100 mg Intravenous Daily Merwyn Katos, MD   100 mg at 10/11/20 2154  . traZODone (DESYREL) tablet 50 mg  50 mg Oral QHS Mansy, Jan A, MD   50 mg at 10/11/20 2347  . vitamin B-12 (CYANOCOBALAMIN) tablet 1,000 mcg  1,000 mcg Oral Daily Mansy, Vernetta Honey, MD        Musculoskeletal: Strength & Muscle Tone: decreased Gait & Station: unsteady Patient leans: Backward  Psychiatric Specialty Exam:  Presentation  General Appearance: Disheveled  Eye Contact:Absent  Speech:Garbled; Slow  Speech Volume:Decreased  Handedness:Right   Mood and Affect  Mood:Labile  Affect:Blunt; Congruent; Labile   Thought Process  Thought Processes:Disorganized  Descriptions of Associations:Loose  Orientation:Partial  Thought Content:No data recorded History of Schizophrenia/Schizoaffective disorder:No data recorded Duration of Psychotic Symptoms:No data recorded Hallucinations:Hallucinations: Auditory Description of Auditory Hallucinations: Voices  Ideas of Reference:Delusions  Suicidal Thoughts:Suicidal Thoughts: No  Homicidal Thoughts:Homicidal Thoughts: No Sensorium  Memory:Recent Fair  Judgment:Fair  Insight:No data recorded  Executive Functions  Concentration:Poor  Attention Span:Fair  Recall:Poor  Fund of Knowledge:No  data recorded Language:Poor  Psychomotor Activity  Psychomotor Activity:Psychomotor Activity: Normal  Assets  Assets:Physical Health  Sleep  Sleep:Sleep: Good  Physical Exam: Physical Exam Constitutional:      Appearance: He is obese. He is ill-appearing.  HENT:     Nose: Nose normal.  Eyes:     Conjunctiva/sclera: Conjunctivae normal.  Cardiovascular:     Rate and Rhythm: Normal rate.   Pulmonary:     Effort: Pulmonary effort is normal.  Musculoskeletal:        General: Normal range of motion.     Cervical back: Normal range of motion and neck supple.  Neurological:     Mental Status: He is alert. He is disoriented.  Psychiatric:        Attention and Perception: He is inattentive. He perceives auditory hallucinations.        Mood and Affect: Affect is labile and blunt.        Speech: Speech is delayed.        Behavior: Behavior is cooperative.        Thought Content: Thought content normal.        Cognition and Memory: Cognition is impaired.        Judgment: Judgment normal.    Review of Systems  Psychiatric/Behavioral: Positive for hallucinations and substance abuse.  All other systems reviewed and are negative.  Blood pressure (!) 132/93, pulse 92, temperature 98.1 F (36.7 C), resp. rate 18, height 5\' 10"  (1.778 m), weight 132.5 kg, SpO2 98 %. Body mass index is 41.9 kg/m.  Treatment Plan Summary: Plan The patient is not a safety risk to himself or others and does not require psychiatric inpatient admission for stabilization and treatment.  Disposition: No evidence of imminent risk to self or others at present.   Patient does not meet criteria for psychiatric inpatient admission. Supportive therapy provided about ongoing stressors.  , NP 10/12/2020 2:53 AM

## 2020-10-12 NOTE — Progress Notes (Signed)
PROGRESS NOTE    Nicholas Valdez  KKX:[id number] DOB: [birth year] DOA: 10/11/2020 PCP: Oswaldo Conroy, MD   Brief Narrative: Taken from H&P and chart review. Nicholas Valdez is a 40 y.o. male with medical history significant for alcohol use, depression, GERD, hepatitis C, hypertension, prediabetes and sleep apnea who presented to the emergency room with suspected alcohol withdrawal for detoxification.  Apparently patient had a very complicated course of alcohol withdrawal in the later half of 2021, where he required intubation, multiple days of Precedex, trached and pegged which has been removed now.  Due to some personal stresses he relapsed and started drinking again in February 2022.  Now wants to have a detox again.  Cannot be done at day behavioral health facility due to his prior complicated alcohol withdrawal history.  According to note by psychiatry he was drinking 30 ounces of wine every day and his last drink was before coming to the emergency room.  Also had an history of auditory hallucinations. Uses marijuana but denies any other drug use.  He was started on CIWA protocol with Ativan and Librium.  Psychiatry was also consulted.  Subjective: Patient was sleeping when seen today.  Easily arousable, no complaints but he was little anxious about the withdrawal because of his history.  Assessment & Plan:   Active Problems:   Alcohol withdrawal (HCC)   Alcohol withdrawal delirium (HCC)   Tobacco abuse   History of alcohol abuse now for detox.  CIWA score of 0 this morning.  Patient drank just before coming to the hospital.  Ethanol levels elevated at 240.  UDS ordered but still pending. Patient is high risk for life-threatening alcohol withdrawal due to his prior history when he required intubation, trach and PEG.  Trach and PEG has been removed. -Continue with CIWA protocol with Ativan and Librium. -Close monitoring if worsening of symptoms-low threshold to transfer him  to ICU. -Continue with supplements which include thiamine, folic acid and multivitamin. -Appreciate psychiatry help.  Nausea and vomiting with epigastric pain on admission.  Patient was placed on IV Protonix, denies any nausea, vomiting or pain this morning.  Might be due to alcoholic gastritis. -Switch IV Protonix with twice daily p.o.  Ongoing tobacco abuse. -Continue with nicotine patch  History of anxiety and depression.  Per chart review he was started on multiple antipsychotics at the time of prior admission.  Psych was consulted.  There is some concern of auditory hallucinations which get worse with intoxication. -Currently being managed with Ativan per CIWA protocol  History of B12 deficiency. -Continue with B12 supplement.  Hypokalemia.  Magnesium at 2 -Monitor potassium and replete as needed.   Objective: Vitals:   10/12/20 0104 10/12/20 0501 10/12/20 0751 10/12/20 1138  BP: (!) 132/93 121/88 (!) 127/92 (!) 134/91  Pulse: 92 83 73 80  Resp:  18 19 17   Temp:  98 F (36.7 C) 97.7 F (36.5 C) 98.1 F (36.7 C)  TempSrc:      SpO2:  97% 99% 100%  Weight:      Height:        Intake/Output Summary (Last 24 hours) at 10/12/2020 1244 Last data filed at 10/12/2020 0506 Gross per 24 hour  Intake 248.73 ml  Output -  Net 248.73 ml   Filed Weights   10/11/20 2038  Weight: 132.5 kg    Examination:  General exam: Appears calm and comfortable  Respiratory system: Clear to auscultation. Respiratory effort normal. Cardiovascular system: S1 & S2 heard,  RRR. No JVD, murmurs, rubs, gallops or clicks. Gastrointestinal system: Soft, nontender, nondistended, bowel sounds positive. Central nervous system: Alert and oriented. No focal neurological deficits.Symmetric 5 x 5 power. Extremities: No edema, no cyanosis, pulses intact and symmetrical. Psychiatry: Judgement and insight appear normal.   DVT prophylaxis: Lovenox Code Status: Full Family Communication: Discussed with  patient Disposition Plan:  Status is: Inpatient  Remains inpatient appropriate because:Inpatient level of care appropriate due to severity of illness   Dispo: The patient is from: Home              Anticipated d/c is to: Home              Patient currently is not medically stable to d/c.   Difficult to place patient No              Level of care: Med-Surg  All the records are reviewed and case discussed with Care Management/Social Worker. Management plans discussed with the patient, nursing and they are in agreement.  Consultants:   Psychiatry  Procedures:  Antimicrobials:   Data Reviewed: I have personally reviewed following labs and imaging studies  CBC: Recent Labs  Lab 10/11/20 2042 10/12/20 0731  WBC 9.8 7.8  HGB 15.8 13.3  HCT 44.8 39.2  MCV 88.7 90.1  PLT 301 207   Basic Metabolic Panel: Recent Labs  Lab 10/11/20 2042 10/12/20 0731  NA 139 141  K 3.5 3.2*  CL 107 109  CO2 21* 25  GLUCOSE 141* 107*  BUN 10 10  CREATININE 1.11 0.70  CALCIUM 8.3* 7.9*  MG  --  2.0   GFR: Estimated Creatinine Clearance: 173.1 mL/min (by C-G formula based on SCr of 0.7 mg/dL). Liver Function Tests: Recent Labs  Lab 10/11/20 2042  AST 33  ALT 26  ALKPHOS 67  BILITOT 0.6  PROT 7.5  ALBUMIN 4.1   No results for input(s): LIPASE, AMYLASE in the last 168 hours. No results for input(s): AMMONIA in the last 168 hours. Coagulation Profile: No results for input(s): INR, PROTIME in the last 168 hours. Cardiac Enzymes: No results for input(s): CKTOTAL, CKMB, CKMBINDEX, TROPONINI in the last 168 hours. BNP (last 3 results) No results for input(s): PROBNP in the last 8760 hours. HbA1C: No results for input(s): HGBA1C in the last 72 hours. CBG: No results for input(s): GLUCAP in the last 168 hours. Lipid Profile: No results for input(s): CHOL, HDL, LDLCALC, TRIG, CHOLHDL, LDLDIRECT in the last 72 hours. Thyroid Function Tests: No results for input(s): TSH, T4TOTAL,  FREET4, T3FREE, THYROIDAB in the last 72 hours. Anemia Panel: No results for input(s): VITAMINB12, FOLATE, FERRITIN, TIBC, IRON, RETICCTPCT in the last 72 hours. Sepsis Labs: No results for input(s): PROCALCITON, LATICACIDVEN in the last 168 hours.  Recent Results (from the past 240 hour(s))  Resp Panel by RT-PCR (Flu A&B, Covid) Nasopharyngeal Swab     Status: None   Collection Time: 10/11/20 10:46 PM   Specimen: Nasopharyngeal Swab; Nasopharyngeal(NP) swabs in vial transport medium  Result Value Ref Range Status   SARS Coronavirus 2 by RT PCR NEGATIVE NEGATIVE Final    Comment: (NOTE) SARS-CoV-2 target nucleic acids are NOT DETECTED.  The SARS-CoV-2 RNA is generally detectable in upper respiratory specimens during the acute phase of infection. The lowest concentration of SARS-CoV-2 viral copies this assay can detect is 138 copies/mL. A negative result does not preclude SARS-Cov-2 infection and should not be used as the sole basis for treatment or other patient management decisions.  A negative result may occur with  improper specimen collection/handling, submission of specimen other than nasopharyngeal swab, presence of viral mutation(s) within the areas targeted by this assay, and inadequate number of viral copies(<138 copies/mL). A negative result must be combined with clinical observations, patient history, and epidemiological information. The expected result is Negative.  Fact Sheet for Patients:  BloggerCourse.com  Fact Sheet for Healthcare Providers:  SeriousBroker.it  This test is no t yet approved or cleared by the [place] FDA and  has been authorized for detection and/or diagnosis of SARS-CoV-2 by FDA under an Emergency Use Authorization (EUA). This EUA will remain  in effect (meaning this test can be used) for the duration of the COVID-19 declaration under Section 564(b)(1) of the Act, 21 U.S.C.section  360bbb-3(b)(1), unless the authorization is terminated  or revoked sooner.       Influenza A by PCR NEGATIVE NEGATIVE Final   Influenza B by PCR NEGATIVE NEGATIVE Final    Comment: (NOTE) The Xpert Xpress SARS-CoV-2/FLU/RSV plus assay is intended as an aid in the diagnosis of influenza from Nasopharyngeal swab specimens and should not be used as a sole basis for treatment. Nasal washings and aspirates are unacceptable for Xpert Xpress SARS-CoV-2/FLU/RSV testing.  Fact Sheet for Patients: BloggerCourse.com  Fact Sheet for Healthcare Providers: SeriousBroker.it  This test is not yet approved or cleared by the [place] FDA and has been authorized for detection and/or diagnosis of SARS-CoV-2 by FDA under an Emergency Use Authorization (EUA). This EUA will remain in effect (meaning this test can be used) for the duration of the COVID-19 declaration under Section 564(b)(1) of the Act, 21 U.S.C. section 360bbb-3(b)(1), unless the authorization is terminated or revoked.  Performed at [facility], [address] [place], [place] [address]      Radiology Studies: No results found.  Scheduled Meds: . calcium carbonate  500 mg of elemental calcium Oral BID WC  . enoxaparin (LOVENOX) injection  0.5 mg/kg Subcutaneous Q24H  . folic acid  1 mg Oral Daily  . furosemide  40 mg Oral Daily  . gabapentin  600 mg Oral Q8H  . LORazepam  0-4 mg Intravenous Q6H   Or  . LORazepam  0-4 mg Oral Q6H  . [START ON 10/14/2020] LORazepam  0-4 mg Intravenous Q12H   Or  . [START ON 10/14/2020] LORazepam  0-4 mg Oral Q12H  . magnesium oxide  400 mg Oral BID  . melatonin  5 mg Oral QHS  . multivitamin with minerals  1 tablet Oral Daily  . nicotine  21 mg Transdermal Once  . thiamine  100 mg Oral Daily   Or  . thiamine  100 mg Intravenous Daily  . traZODone  50 mg Oral QHS  . cyanocobalamin  1,000 mcg Oral Daily   Continuous  Infusions: . sodium chloride 75 mL/hr at 10/12/20 0506     LOS: 1 day   Time spent: 35 minutes. More than 50% of the time was spent in counseling/coordination of care  Arnetha Courser, MD [facility]  If 7PM-7AM, please contact night-coverage [email or web address]  10/12/2020, 12:44 PM   This record has been created using Conservation officer, historic buildings. Errors have been sought and corrected,but may not always be located. Such creation errors do not reflect on the standard of care.

## 2020-10-12 NOTE — Progress Notes (Addendum)
TOC consult for substance abuse resources. This CSW attempted to meet with patient at bedside x 2. Patient sleeping. CSW left substance abuse resource list for patient in case he is interested. Informed RN. No other TOC needs identified at this time.  Alfonso Ramus, [place] [phone]

## 2020-10-13 DIAGNOSIS — F341 Dysthymic disorder: Secondary | ICD-10-CM

## 2020-10-13 DIAGNOSIS — F10920 Alcohol use, unspecified with intoxication, uncomplicated: Secondary | ICD-10-CM | POA: Diagnosis not present

## 2020-10-13 DIAGNOSIS — F10230 Alcohol dependence with withdrawal, uncomplicated: Secondary | ICD-10-CM | POA: Diagnosis not present

## 2020-10-13 DIAGNOSIS — Z720 Tobacco use: Secondary | ICD-10-CM | POA: Diagnosis not present

## 2020-10-13 MED ORDER — IBUPROFEN 100 MG/5ML PO SUSP
200.0000 mg | Freq: Three times a day (TID) | ORAL | Status: DC | PRN
  Administered 2020-10-14 (×2): 200 mg via ORAL
  Filled 2020-10-13 (×6): qty 10

## 2020-10-13 MED ORDER — CHLORDIAZEPOXIDE HCL 25 MG PO CAPS: 25.0000 mg | ORAL_CAPSULE | Freq: Two times a day (BID) | ORAL | Status: DC

## 2020-10-13 MED ORDER — NICOTINE 21 MG/24HR TD PT24
21.0000 mg | MEDICATED_PATCH | Freq: Every day | TRANSDERMAL | Status: DC
  Administered 2020-10-13 – 2020-10-14 (×2): 21 mg via TRANSDERMAL
  Filled 2020-10-13 (×3): qty 1

## 2020-10-13 MED ORDER — CHLORDIAZEPOXIDE HCL 25 MG PO CAPS: 25.0000 mg | ORAL_CAPSULE | Freq: Every day | ORAL | Status: DC

## 2020-10-13 MED ORDER — ESCITALOPRAM OXALATE 10 MG PO TABS
20.0000 mg | ORAL_TABLET | Freq: Every day | ORAL | Status: DC
  Administered 2020-10-13 – 2020-10-14 (×2): 20 mg via ORAL
  Filled 2020-10-13 (×3): qty 2

## 2020-10-13 MED ORDER — CHLORDIAZEPOXIDE HCL 25 MG PO CAPS
25.0000 mg | ORAL_CAPSULE | Freq: Three times a day (TID) | ORAL | Status: DC
  Administered 2020-10-15: 25 mg via ORAL
  Filled 2020-10-13: qty 1

## 2020-10-13 MED ORDER — CHLORDIAZEPOXIDE HCL 25 MG PO CAPS
25.0000 mg | ORAL_CAPSULE | Freq: Four times a day (QID) | ORAL | Status: AC
  Administered 2020-10-13 – 2020-10-14 (×7): 25 mg via ORAL
  Filled 2020-10-13 (×7): qty 1

## 2020-10-13 NOTE — Consult Note (Signed)
WOC Nurse Consult Note: Reason for Consult: Left lateral heel Unstageable pressure injury.  Present on last admission. Wound type: Unstageable pressure injury to left lateral heel Pressure Injury POA: Yes Measurement: 2cm x 1.5cm with depth unable to be determined due to the presence of nonviable tissue Wound bed:See above, 100% nonviable Drainage (amount, consistency, odor) none Periwound: intact with mild erythema, no induration or fluctuance Dressing procedure/placement/frequency: I have implemented a POC for twice daily saline dressings to autolytically debride the dried serum and other nonviable tissue.  Patient turns and repositions himself in bed but there is a lateral rotation to the left heel and it applies unrelieved pressure to the wound. I have provided a left pressure redistribution heel boot for him to wear in house.  WOC nursing team will not follow, but will remain available to this patient, the nursing and medical teams.  Please re-consult if needed. Thanks, Ladona Mow, MSN, RN, GNP, [place]  Pager# [phone]

## 2020-10-13 NOTE — TOC Initial Note (Addendum)
Transition of Care [facility]) - Initial/Assessment Note    Patient Details  Name: Nicholas Valdez MRN: [id number] Date of Birth: November 28, 1980  [facility]) CM/SW Contact:    Liliana Cline, LCSW Phone Number: 10/13/2020, 2:47 PM  Clinical Narrative:                Spoke with patient for High Risk Assessment. Patient lives with his parents. He reported they are supportive and will take him to [facility] PCP is Dr. Hessie Diener. Pharmacy is [facility] in [place]. [name] provides transportation since he does not have his license. Patient asked about counseling resources. Will provide Resource List which includes counseling resources. Also previously provided Substance Abuse Resource List.   Expected Discharge Plan: Home/Self Care Barriers to Discharge: Continued Medical Work up   Patient Goals and CMS Choice Patient states their goals for this hospitalization and ongoing recovery are:: to return home CMS Medicare.gov Compare Post Acute Care list provided to:: Patient Choice offered to / list presented to : Patient  Expected Discharge Plan and Services Expected Discharge Plan: Home/Self Care       Living arrangements for the past 2 months: Single Family Home                                      Prior Living Arrangements/Services Living arrangements for the past 2 months: Single Family Home Lives with:: Parents Patient language and need for interpreter reviewed:: Yes Do you feel safe going back to the place where you live?: Yes      Need for Family Participation in Patient Care: Yes (Comment) Care giver support system in place?: Yes (comment)   Criminal Activity/Legal Involvement Pertinent to Current Situation/Hospitalization: No - Comment as needed  Activities of Daily Living Home Assistive Devices/Equipment: None ADL Screening (condition at time of admission) Patient's cognitive ability adequate to safely complete daily activities?: Yes Is the patient  deaf or have difficulty hearing?: No Does the patient have difficulty seeing, even when wearing glasses/contacts?: No Does the patient have difficulty concentrating, remembering, or making decisions?: No Patient able to express need for assistance with ADLs?: Yes Does the patient have difficulty dressing or bathing?: No Independently performs ADLs?: No Communication: Independent Dressing (OT): Independent Grooming: Independent Feeding: Independent Bathing: Independent Toileting: Independent In/Out Bed: Independent Walks in Home: Independent Does the patient have difficulty walking or climbing stairs?: No Weakness of Legs: None Weakness of Arms/Hands: None  Permission Sought/Granted                  Emotional Assessment       Orientation: : Oriented to Self,Oriented to Place,Oriented to  Time,Oriented to Situation Alcohol / Substance Use: Not Applicable Psych Involvement: No (comment)  Admission diagnosis:  Alcohol withdrawal (HCC) [F10.239] Alcohol withdrawal syndrome without complication (HCC) [F10.230] Alcoholic intoxication without complication (HCC) [F10.920] Patient Active Problem List   Diagnosis Date Noted  . Alcoholic intoxication without complication (HCC)   . Dysphagia   . Hypoalbuminemia due to protein-calorie malnutrition (HCC)   . Left foot pain   . Peripheral edema   . Impulsive   . Acute encephalopathy   . Hyperammonemia (HCC)   . Morbid obesity (HCC)   . Sleep disturbance   . Acute on chronic anemia   . Status post tracheostomy (HCC)   . S/P percutaneous endoscopic gastrostomy (PEG) tube placement (HCC)   . Pressure injury  of skin 07/25/2020  . Acute respiratory failure with hypoxia (HCC)   . Elevated lactic acid level 06/20/2020  . Alcohol withdrawal delirium (HCC) 06/19/2020  . Increased ammonia level 06/19/2020  . Acute metabolic encephalopathy 06/19/2020  . Elevated LFTs 06/19/2020  . Tobacco abuse 06/19/2020  . Alcohol withdrawal (HCC)  12/15/2018   PCP:  Oswaldo Conroy, MD Pharmacy:   [facility] [phone] - [place], [place] - [address] AT [facility] OF [address] [place] [address] [place] [address] Phone: [phone] Fax: [phone]     Social Determinants of Health (SDOH) Interventions    Readmission Risk Interventions Readmission Risk Prevention Plan 10/13/2020 12/16/2018  Transportation Screening Complete Complete  PCP or Specialist Appt within 5-7 Days - Complete  Home Care Screening - Complete  Medication Review (RN CM) - Complete  Medication Review (RN Care Manager) Complete -  PCP or Specialist appointment within 3-5 days of discharge Complete -  HRI or Home Care Consult Complete -  SW Recovery Care/Counseling Consult Complete -  Palliative Care Screening Not Applicable -  Skilled Nursing Facility Complete -  Some recent data might be hidden

## 2020-10-13 NOTE — Consult Note (Signed)
Patient was very concerned that the correct phone number for him be listed in the chart.  The number he quoted to me was [phone].  That is different than the number that is currently in the chart.

## 2020-10-13 NOTE — Progress Notes (Signed)
Pt requesting ativan. RN educated pt on CIWAA and ativan as needed if in withdrawals and that his schedule had been changed from every 6 hr to 12 hr. Pt understood , all questions answered.

## 2020-10-13 NOTE — Progress Notes (Signed)
PROGRESS NOTE    Nicholas Valdez  GTX:[id number] DOB: 1981/02/19 DOA: 10/11/2020 PCP: Oswaldo Conroy, MD   Brief Narrative: Taken from H&P and chart review. Nicholas Valdez is a 40 y.o. male with medical history significant for alcohol use, depression, GERD, hepatitis C, hypertension, prediabetes and sleep apnea who presented to the emergency room with suspected alcohol withdrawal for detoxification.  Apparently patient had a very complicated course of alcohol withdrawal in the later half of 2021, where he required intubation, multiple days of Precedex, trached and pegged which has been removed now.  Due to some personal stresses he relapsed and started drinking again in February 2022.  Now wants to have a detox again.  Cannot be done at day behavioral health facility due to his prior complicated alcohol withdrawal history.  According to note by psychiatry he was drinking 30 ounces of wine every day and his last drink was before coming to the emergency room.  Also had an history of auditory hallucinations. Uses marijuana but denies any other drug use.  He was started on CIWA protocol with Ativan and Librium.  Psychiatry was also consulted.  Subjective: Patient was seen and examined today.  No new complaint.  There was a cigarette pack on his bed but he denied any smoking while in the hospital. He was also want me to check his left heel pressure sore, stating it is healing but very slowly, he developed that sore during his recent prolonged hospitalization in December 2021. Denies any worsening or pain in that sore.  Assessment & Plan:   Active Problems:   Alcohol withdrawal (HCC)   Alcohol withdrawal delirium (HCC)   Tobacco abuse   Alcoholic intoxication without complication (HCC)   History of alcohol abuse now for detox.  CIWA score of 5 this morning.  Last drink was on 10/11/2020 just before coming to the hospital, ethanol levels elevated at 240 on admission.  UDS ordered but  still pending. Patient is high risk for life-threatening alcohol withdrawal due to his prior history when he required intubation, trach and PEG.  Trach and PEG has been removed. -Continue with CIWA protocol with Ativan and Librium. -Close monitoring if worsening of symptoms-low threshold to transfer him to ICU. -Continue with supplements which include thiamine, folic acid and multivitamin. -Appreciate psychiatry help.  Nausea and vomiting with epigastric pain on admission.  Resolved.  Initially received IV Protonix which was switched with p.o. -Continue with p.o. Protonix  Ongoing tobacco abuse. -Continue with nicotine patch  History of anxiety and depression.  Per chart review he was started on multiple antipsychotics at the time of prior admission.  Psych was consulted.  There is some concern of auditory hallucinations which get worse with intoxication. -Currently being managed with Ativan per CIWA protocol  History of B12 deficiency. -Continue with B12 supplement.  Hypokalemia.  Magnesium at 2 -Monitor potassium and replete as needed.  Left heel pressure injury.  Patient had about 2 cm unstageable pressure injury on left heel, no obvious drainage.  Thick unviable tissue. -Wound care consult.   Objective: Vitals:   10/13/20 0035 10/13/20 0444 10/13/20 0759 10/13/20 1124  BP: 127/75 130/87 111/69 131/76  Pulse: 81 65 60 61  Resp: 19 18 15 16   Temp: 98.3 F (36.8 C) 97.9 F (36.6 C) 98.4 F (36.9 C) 97.6 F (36.4 C)  TempSrc: Oral Oral    SpO2: 98% 98% 100% 97%  Weight:      Height:  Intake/Output Summary (Last 24 hours) at 10/13/2020 1353 Last data filed at 10/13/2020 0900 Gross per 24 hour  Intake 360 ml  Output --  Net 360 ml   Filed Weights   10/11/20 2038  Weight: 132.5 kg    Examination:  General.  Well-developed gentleman, in no acute distress. Pulmonary.  Lungs clear bilaterally, normal respiratory effort. CV.  Regular rate and rhythm, no JVD,  rub or murmur. Abdomen.  Soft, nontender, nondistended, BS positive. CNS.  Alert and oriented x3.  No focal neurologic deficit. Extremities.  No edema, no cyanosis, pulses intact and symmetrical. Psychiatry.  Judgment and insight appears normal.  DVT prophylaxis: Lovenox Code Status: Full Family Communication: Discussed with patient Disposition Plan:  Status is: Inpatient  Remains inpatient appropriate because:Inpatient level of care appropriate due to severity of illness   Dispo: The patient is from: Home              Anticipated d/c is to: Home              Patient currently is not medically stable to d/c.   Difficult to place patient No              Level of care: Med-Surg  All the records are reviewed and case discussed with Care Management/Social Worker. Management plans discussed with the patient, nursing and they are in agreement.  Consultants:   Psychiatry  Procedures:  Antimicrobials:   Data Reviewed: I have personally reviewed following labs and imaging studies  CBC: Recent Labs  Lab 10/11/20 2042 10/12/20 0731  WBC 9.8 7.8  HGB 15.8 13.3  HCT 44.8 39.2  MCV 88.7 90.1  PLT 301 207   Basic Metabolic Panel: Recent Labs  Lab 10/11/20 2042 10/12/20 0731  NA 139 141  K 3.5 3.2*  CL 107 109  CO2 21* 25  GLUCOSE 141* 107*  BUN 10 10  CREATININE 1.11 0.70  CALCIUM 8.3* 7.9*  MG  --  2.0   GFR: Estimated Creatinine Clearance: 173.1 mL/min (by C-G formula based on SCr of 0.7 mg/dL). Liver Function Tests: Recent Labs  Lab 10/11/20 2042  AST 33  ALT 26  ALKPHOS 67  BILITOT 0.6  PROT 7.5  ALBUMIN 4.1   No results for input(s): LIPASE, AMYLASE in the last 168 hours. No results for input(s): AMMONIA in the last 168 hours. Coagulation Profile: No results for input(s): INR, PROTIME in the last 168 hours. Cardiac Enzymes: No results for input(s): CKTOTAL, CKMB, CKMBINDEX, TROPONINI in the last 168 hours. BNP (last 3 results) No results for  input(s): PROBNP in the last 8760 hours. HbA1C: No results for input(s): HGBA1C in the last 72 hours. CBG: No results for input(s): GLUCAP in the last 168 hours. Lipid Profile: No results for input(s): CHOL, HDL, LDLCALC, TRIG, CHOLHDL, LDLDIRECT in the last 72 hours. Thyroid Function Tests: No results for input(s): TSH, T4TOTAL, FREET4, T3FREE, THYROIDAB in the last 72 hours. Anemia Panel: No results for input(s): VITAMINB12, FOLATE, FERRITIN, TIBC, IRON, RETICCTPCT in the last 72 hours. Sepsis Labs: No results for input(s): PROCALCITON, LATICACIDVEN in the last 168 hours.  Recent Results (from the past 240 hour(s))  Resp Panel by RT-PCR (Flu A&B, Covid) Nasopharyngeal Swab     Status: None   Collection Time: 10/11/20 10:46 PM   Specimen: Nasopharyngeal Swab; Nasopharyngeal(NP) swabs in vial transport medium  Result Value Ref Range Status   SARS Coronavirus 2 by RT PCR NEGATIVE NEGATIVE Final    Comment: (NOTE)  SARS-CoV-2 target nucleic acids are NOT DETECTED.  The SARS-CoV-2 RNA is generally detectable in upper respiratory specimens during the acute phase of infection. The lowest concentration of SARS-CoV-2 viral copies this assay can detect is 138 copies/mL. A negative result does not preclude SARS-Cov-2 infection and should not be used as the sole basis for treatment or other patient management decisions. A negative result may occur with  improper specimen collection/handling, submission of specimen other than nasopharyngeal swab, presence of viral mutation(s) within the areas targeted by this assay, and inadequate number of viral copies(<138 copies/mL). A negative result must be combined with clinical observations, patient history, and epidemiological information. The expected result is Negative.  Fact Sheet for Patients:  BloggerCourse.com  Fact Sheet for Healthcare Providers:  SeriousBroker.it  This test is no t yet  approved or cleared by the [place] FDA and  has been authorized for detection and/or diagnosis of SARS-CoV-2 by FDA under an Emergency Use Authorization (EUA). This EUA will remain  in effect (meaning this test can be used) for the duration of the COVID-19 declaration under Section 564(b)(1) of the Act, 21 U.S.C.section 360bbb-3(b)(1), unless the authorization is terminated  or revoked sooner.       Influenza A by PCR NEGATIVE NEGATIVE Final   Influenza B by PCR NEGATIVE NEGATIVE Final    Comment: (NOTE) The Xpert Xpress SARS-CoV-2/FLU/RSV plus assay is intended as an aid in the diagnosis of influenza from Nasopharyngeal swab specimens and should not be used as a sole basis for treatment. Nasal washings and aspirates are unacceptable for Xpert Xpress SARS-CoV-2/FLU/RSV testing.  Fact Sheet for Patients: BloggerCourse.com  Fact Sheet for Healthcare Providers: SeriousBroker.it  This test is not yet approved or cleared by the [place] FDA and has been authorized for detection and/or diagnosis of SARS-CoV-2 by FDA under an Emergency Use Authorization (EUA). This EUA will remain in effect (meaning this test can be used) for the duration of the COVID-19 declaration under Section 564(b)(1) of the Act, 21 U.S.C. section 360bbb-3(b)(1), unless the authorization is terminated or revoked.  Performed at [facility], [address] [place], [place] [address]      Radiology Studies: No results found.  Scheduled Meds: . calcium carbonate  500 mg of elemental calcium Oral BID WC  . chlordiazePOXIDE  25 mg Oral QID   Followed by  . [START ON 10/15/2020] chlordiazePOXIDE  25 mg Oral TID   Followed by  . [START ON 10/16/2020] chlordiazePOXIDE  25 mg Oral BID   Followed by  . [START ON 10/17/2020] chlordiazePOXIDE  25 mg Oral QHS  . enoxaparin (LOVENOX) injection  0.5 mg/kg Subcutaneous Q24H  . folic acid  1 mg Oral  Daily  . furosemide  40 mg Oral Daily  . gabapentin  600 mg Oral Q8H  . [START ON 10/14/2020] LORazepam  0-4 mg Intravenous Q12H   Or  . [START ON 10/14/2020] LORazepam  0-4 mg Oral Q12H  . magnesium oxide  400 mg Oral BID  . melatonin  5 mg Oral QHS  . multivitamin with minerals  1 tablet Oral Daily  . nicotine  21 mg Transdermal Daily  . thiamine  100 mg Oral Daily   Or  . thiamine  100 mg Intravenous Daily  . traZODone  50 mg Oral QHS  . cyanocobalamin  1,000 mcg Oral Daily   Continuous Infusions: . sodium chloride Stopped (10/12/20 1400)     LOS: 2 days   Time spent: 25 minutes. More  than 50% of the time was spent in counseling/coordination of care  Arnetha Courser, MD [facility]  If 7PM-7AM, please contact night-coverage [email or web address]  10/13/2020, 1:53 PM   This record has been created using Conservation officer, historic buildings. Errors have been sought and corrected,but may not always be located. Such creation errors do not reflect on the standard of care.

## 2020-10-13 NOTE — Consult Note (Signed)
[facility] Face-to-Face Psychiatry Consult   Reason for Consult: Consult for 40 year old man following up on evaluation by nurse practitioner.  Admitted to the hospital for alcohol withdrawal Referring Physician: Nelson Chimes Patient Identification: Nicholas Valdez MRN:  [id number] Principal Diagnosis: Alcohol withdrawal [facility]) Diagnosis:  Principal Problem:   Alcohol withdrawal (HCC) Active Problems:   Alcohol withdrawal delirium (HCC)   Tobacco abuse   Alcoholic intoxication without complication (HCC)   Dysthymia   Total Time spent with patient: 30 minutes  Subjective:   Nicholas Valdez is a 41 y.o. male patient admitted with "I am feeling much better than I was yesterday".  HPI: Patient seen chart reviewed.  40 year old man came into the hospital 2 days ago intoxicated with alcohol withdrawal symptoms and confusion.  Patient was admitted to the medical service.  On evaluation today I found him sitting up on the side of his bed eating supper.  He was pleasant and cooperative.  He says he has not had any hallucinations today.  He still feels very jittery and nervous and his mood is dysphoric but he denies any suicidal thoughts.  Patient says he has been smoking marijuana only about once a week but has been drinking heavily.  He has been taking his Lexapro that he is normally prescribed as well as his gabapentin.  Patient expresses an understanding that his alcohol abuse is a severe potentially life-threatening problem.  He says he was trying to stay sober after his last extended hospitalization in December but his anxiety was getting worse.  Does not sound like he is following up with any specific provider locally.  Additionally he talks about some stresses related to his girlfriend who lives back in [place] and being separated from her.  Past Psychiatric History: Past history of severe alcohol withdrawal with an extended spell of withdrawal confusion and delirium in late 2021.  History of longstanding  issues of anxiety and struggles with alcohol and cannabis use  Risk to Self:   Risk to Others:   Prior Inpatient Therapy:   Prior Outpatient Therapy:    Past Medical History:  Past Medical History:  Diagnosis Date  . Alcohol abuse   . Anxiety   . Depression   . Drug abuse (HCC)   . GERD (gastroesophageal reflux disease)   . Hepatitis C   . Hypertension   . Pneumonia   . Pre-diabetes   . Sleep apnea     Past Surgical History:  Procedure Laterality Date  . GASTROSTOMY W/ FEEDING TUBE Left 07/22/2020  . TRACHEOSTOMY TUBE PLACEMENT N/A 07/10/2020   Procedure: TRACHEOSTOMY;  Surgeon: Bud Face, MD;  Location: [facility] ORS;  Service: ENT;  Laterality: N/A;   Family History:  Family History  Problem Relation Age of Onset  . Hypertension Other    Family Psychiatric  History: None reported Social History:  Social History   Substance and Sexual Activity  Alcohol Use Yes   Comment: drinks 30oz wine a day     Social History   Substance and Sexual Activity  Drug Use Yes  . Types: Marijuana    Social History   Socioeconomic History  . Marital status: Single    Spouse name: Not on file  . Number of children: Not on file  . Years of education: Not on file  . Highest education level: Not on file  Occupational History  . Occupation: unemployed  Tobacco Use  . Smoking status: Current Every Day Smoker    Packs/day: 1.00  Types: Cigarettes  . Smokeless tobacco: Never Used  Vaping Use  . Vaping Use: Former  Substance and Sexual Activity  . Alcohol use: Yes    Comment: drinks 30oz wine a day  . Drug use: Yes    Types: Marijuana  . Sexual activity: Yes    Birth control/protection: Condom  Other Topics Concern  . Not on file  Social History Narrative  . Not on file   Social Determinants of Health   Financial Resource Strain: Not on file  Food Insecurity: Not on file  Transportation Needs: Not on file  Physical Activity: Not on file  Stress: Not on file   Social Connections: Not on file   Additional Social History:    Allergies:   Allergies  Allergen Reactions  . Penicillins Hives    Did it involve swelling of the face/tongue/throat, SOB, or low BP? Yes Did it involve sudden or severe rash/hives, skin peeling, or any reaction on the inside of your mouth or nose? Yes Did you need to seek medical attention at a hospital or doctor's office? Yes When did it last happen?childhood If all above answers are "NO", may proceed with cephalosporin use.  Patient tolerated Ancef administration well  . Zofran [Ondansetron] Hives    Labs:  Results for orders placed or performed during the hospital encounter of 10/11/20 (from the past 48 hour(s))  Comprehensive metabolic panel     Status: Abnormal   Collection Time: 10/11/20  8:42 PM  Result Value Ref Range   Sodium 139 135 - 145 mmol/L   Potassium 3.5 3.5 - 5.1 mmol/L   Chloride 107 98 - 111 mmol/L   CO2 21 (L) 22 - 32 mmol/L   Glucose, Bld 141 (H) 70 - 99 mg/dL    Comment: Glucose reference range applies only to samples taken after fasting for at least 8 hours.   BUN 10 6 - 20 mg/dL   Creatinine, Ser 1.85 0.61 - 1.24 mg/dL   Calcium 8.3 (L) 8.9 - 10.3 mg/dL   Total Protein 7.5 6.5 - 8.1 g/dL   Albumin 4.1 3.5 - 5.0 g/dL   AST 33 15 - 41 U/L   ALT 26 0 - 44 U/L   Alkaline Phosphatase 67 38 - 126 U/L   Total Bilirubin 0.6 0.3 - 1.2 mg/dL   GFR, Estimated >63 >14 mL/min    Comment: (NOTE) Calculated using the CKD-EPI Creatinine Equation (2021)    Anion gap 11 5 - 15    Comment: Performed at [facility], [address] [place], [place] [address]  Ethanol     Status: Abnormal   Collection Time: 10/11/20  8:42 PM  Result Value Ref Range   Alcohol, Ethyl (B) 240 (H) <10 mg/dL    Comment: (NOTE) Lowest detectable limit for serum alcohol is 10 mg/dL.  For medical purposes only. Performed at [facility], [address] [place], [place] [address]   cbc      Status: None   Collection Time: 10/11/20  8:42 PM  Result Value Ref Range   WBC 9.8 4.0 - 10.5 K/uL   RBC 5.05 4.22 - 5.81 MIL/uL   Hemoglobin 15.8 13.0 - 17.0 g/dL   HCT 85.0 27.7 - 41.2 %   MCV 88.7 80.0 - 100.0 fL   MCH 31.3 26.0 - 34.0 pg   MCHC 35.3 30.0 - 36.0 g/dL   RDW 87.8 67.6 - 72.0 %   Platelets 301 150 - 400 K/uL  nRBC 0.0 0.0 - 0.2 %    Comment: Performed at [facility], [address] [place], [place] [address]  Resp Panel by RT-PCR (Flu A&B, Covid) Nasopharyngeal Swab     Status: None   Collection Time: 10/11/20 10:46 PM   Specimen: Nasopharyngeal Swab; Nasopharyngeal(NP) swabs in vial transport medium  Result Value Ref Range   SARS Coronavirus 2 by RT PCR NEGATIVE NEGATIVE    Comment: (NOTE) SARS-CoV-2 target nucleic acids are NOT DETECTED.  The SARS-CoV-2 RNA is generally detectable in upper respiratory specimens during the acute phase of infection. The lowest concentration of SARS-CoV-2 viral copies this assay can detect is 138 copies/mL. A negative result does not preclude SARS-Cov-2 infection and should not be used as the sole basis for treatment or other patient management decisions. A negative result may occur with  improper specimen collection/handling, submission of specimen other than nasopharyngeal swab, presence of viral mutation(s) within the areas targeted by this assay, and inadequate number of viral copies(<138 copies/mL). A negative result must be combined with clinical observations, patient history, and epidemiological information. The expected result is Negative.  Fact Sheet for Patients:  BloggerCourse.comhttps://www.fda.gov/media/152166/download  Fact Sheet for Healthcare Providers:  SeriousBroker.ithttps://www.fda.gov/media/152162/download  This test is no t yet approved or cleared by the [place] FDA and  has been authorized for detection and/or diagnosis of SARS-CoV-2 by FDA under an Emergency Use Authorization (EUA). This EUA will remain  in  effect (meaning this test can be used) for the duration of the COVID-19 declaration under Section 564(b)(1) of the Act, 21 U.S.C.section 360bbb-3(b)(1), unless the authorization is terminated  or revoked sooner.       Influenza A by PCR NEGATIVE NEGATIVE   Influenza B by PCR NEGATIVE NEGATIVE    Comment: (NOTE) The Xpert Xpress SARS-CoV-2/FLU/RSV plus assay is intended as an aid in the diagnosis of influenza from Nasopharyngeal swab specimens and should not be used as a sole basis for treatment. Nasal washings and aspirates are unacceptable for Xpert Xpress SARS-CoV-2/FLU/RSV testing.  Fact Sheet for Patients: BloggerCourse.comhttps://www.fda.gov/media/152166/download  Fact Sheet for Healthcare Providers: SeriousBroker.ithttps://www.fda.gov/media/152162/download  This test is not yet approved or cleared by the [place] FDA and has been authorized for detection and/or diagnosis of SARS-CoV-2 by FDA under an Emergency Use Authorization (EUA). This EUA will remain in effect (meaning this test can be used) for the duration of the COVID-19 declaration under Section 564(b)(1) of the Act, 21 U.S.C. section 360bbb-3(b)(1), unless the authorization is terminated or revoked.  Performed at [facility], [address] [place], [place] [address]   Basic metabolic panel     Status: Abnormal   Collection Time: 10/12/20  7:31 AM  Result Value Ref Range   Sodium 141 135 - 145 mmol/L   Potassium 3.2 (L) 3.5 - 5.1 mmol/L   Chloride 109 98 - 111 mmol/L   CO2 25 22 - 32 mmol/L   Glucose, Bld 107 (H) 70 - 99 mg/dL    Comment: Glucose reference range applies only to samples taken after fasting for at least 8 hours.   BUN 10 6 - 20 mg/dL   Creatinine, Ser 0.980.70 0.61 - 1.24 mg/dL   Calcium 7.9 (L) 8.9 - 10.3 mg/dL   GFR, Estimated >11>60 >91>60 mL/min    Comment: (NOTE) Calculated using the CKD-EPI Creatinine Equation (2021)    Anion gap 7 5 - 15    Comment: Performed at [facility], [address] [place], [place] [address]  CBC  Status: None   Collection Time: 10/12/20  7:31 AM  Result Value Ref Range   WBC 7.8 4.0 - 10.5 K/uL   RBC 4.35 4.22 - 5.81 MIL/uL   Hemoglobin 13.3 13.0 - 17.0 g/dL   HCT 74.9 44.9 - 67.5 %   MCV 90.1 80.0 - 100.0 fL   MCH 30.6 26.0 - 34.0 pg   MCHC 33.9 30.0 - 36.0 g/dL   RDW 91.6 38.4 - 66.5 %   Platelets 207 150 - 400 K/uL   nRBC 0.0 0.0 - 0.2 %    Comment: Performed at [facility], [address], [place], [place] [address]  Magnesium     Status: None   Collection Time: 10/12/20  7:31 AM  Result Value Ref Range   Magnesium 2.0 1.7 - 2.4 mg/dL    Comment: Performed at [facility], [address] [place], [place] [address]    Current Facility-Administered Medications  Medication Dose Route Frequency Provider Last Rate Last Admin  . 0.9 %  sodium chloride infusion   Intravenous Continuous Mansy, Vernetta Honey, MD   Stopped at 10/12/20 1400  . acetaminophen (TYLENOL) tablet 650 mg  650 mg Oral Q6H PRN Mansy, Jan A, MD       Or  . acetaminophen (TYLENOL) suppository 650 mg  650 mg Rectal Q6H PRN Mansy, Jan A, MD      . calcium carbonate (OS-CAL - dosed in mg of elemental calcium) tablet 500 mg of elemental calcium  500 mg of elemental calcium Oral BID WC Arnetha Courser, MD   500 mg of elemental calcium at 10/13/20 1806  . chlordiazePOXIDE (LIBRIUM) capsule 25 mg  25 mg Oral QID Arnetha Courser, MD   25 mg at 10/13/20 1806   Followed by  . [START ON 10/15/2020] chlordiazePOXIDE (LIBRIUM) capsule 25 mg  25 mg Oral TID Arnetha Courser, MD       Followed by  . [START ON 10/16/2020] chlordiazePOXIDE (LIBRIUM) capsule 25 mg  25 mg Oral BID Arnetha Courser, MD       Followed by  . [START ON 10/17/2020] chlordiazePOXIDE (LIBRIUM) capsule 25 mg  25 mg Oral QHS Amin, Tilman Neat, MD      . enoxaparin (LOVENOX) injection 67.5 mg  0.5 mg/kg Subcutaneous Q24H Mansy, Jan A, MD   67.5 mg at 10/12/20 2247  . escitalopram (LEXAPRO) tablet 20 mg  20 mg Oral Daily  Cedrik Heindl T, MD      . folic acid (FOLVITE) tablet 1 mg  1 mg Oral Daily Mansy, Jan A, MD   1 mg at 10/13/20 1240  . furosemide (LASIX) tablet 40 mg  40 mg Oral Daily Mansy, Jan A, MD   40 mg at 10/13/20 1240  . gabapentin (NEURONTIN) capsule 600 mg  600 mg Oral Q8H Mansy, Jan A, MD   600 mg at 10/13/20 1258  . [START ON 10/14/2020] LORazepam (ATIVAN) injection 0-4 mg  0-4 mg Intravenous Q12H Merwyn Katos, MD       Or  . Melene Muller ON 10/14/2020] LORazepam (ATIVAN) tablet 0-4 mg  0-4 mg Oral Q12H Merwyn Katos, MD      . magnesium hydroxide (MILK OF MAGNESIA) suspension 30 mL  30 mL Oral Daily PRN Mansy, Jan A, MD      . magnesium oxide (MAG-OX) tablet 400 mg  400 mg Oral BID Mansy, Jan A, MD   400 mg at 10/13/20 1240  . melatonin tablet 5 mg  5 mg Oral  QHS Mansy, Jan A, MD   5 mg at 10/12/20 2246  . multivitamin with minerals tablet 1 tablet  1 tablet Oral Daily Mansy, Jan A, MD   1 tablet at 10/13/20 1240  . nicotine (NICODERM CQ - dosed in mg/24 hours) patch 21 mg  21 mg Transdermal Daily Arnetha Courser, MD   21 mg at 10/13/20 1259  . thiamine tablet 100 mg  100 mg Oral Daily Merwyn Katos, MD   100 mg at 10/12/20 1041   Or  . thiamine (B-1) injection 100 mg  100 mg Intravenous Daily Merwyn Katos, MD   100 mg at 10/13/20 1240  . traZODone (DESYREL) tablet 50 mg  50 mg Oral QHS Mansy, Jan A, MD   50 mg at 10/12/20 2246  . vitamin B-12 (CYANOCOBALAMIN) tablet 1,000 mcg  1,000 mcg Oral Daily Mansy, Jan A, MD   1,000 mcg at 10/13/20 1241    Musculoskeletal: Strength & Muscle Tone: within normal limits Gait & Station: normal Patient leans: N/A            Psychiatric Specialty Exam:  Presentation  General Appearance: Disheveled  Eye Contact:Absent  Speech:Garbled; Slow  Speech Volume:Decreased  Handedness:Right   Mood and Affect  Mood:Labile  Affect:Blunt; Congruent; Labile   Thought Process  Thought Processes:Disorganized  Descriptions of  Associations:Loose  Orientation:Partial  Thought Content:Delusions  History of Schizophrenia/Schizoaffective disorder:No data recorded Duration of Psychotic Symptoms:No data recorded Hallucinations:No data recorded Ideas of Reference:Delusions  Suicidal Thoughts:No data recorded Homicidal Thoughts:No data recorded  Sensorium  Memory:Recent Fair  Judgment:Fair  Insight:Lacking   Executive Functions  Concentration:Poor  Attention Span:Fair  Recall:Poor  Fund of Knowledge:Poor  Language:Poor   Psychomotor Activity  Psychomotor Activity:No data recorded  Assets  Assets:Physical Health   Sleep  Sleep:No data recorded  Physical Exam: Physical Exam Vitals and nursing note reviewed.  Constitutional:      Appearance: Normal appearance.  HENT:     Head: Normocephalic and atraumatic.     Mouth/Throat:     Pharynx: Oropharynx is clear.  Eyes:     Pupils: Pupils are equal, round, and reactive to light.  Cardiovascular:     Rate and Rhythm: Normal rate and regular rhythm.  Pulmonary:     Effort: Pulmonary effort is normal.     Breath sounds: Normal breath sounds.  Abdominal:     General: Abdomen is flat.     Palpations: Abdomen is soft.  Musculoskeletal:        General: Normal range of motion.  Skin:    General: Skin is warm and dry.  Neurological:     General: No focal deficit present.     Mental Status: He is alert. Mental status is at baseline.  Psychiatric:        Mood and Affect: Mood is anxious and depressed.        Thought Content: Thought content normal. Thought content is not paranoid. Thought content does not include suicidal ideation.    Review of Systems  Constitutional: Negative.   HENT: Negative.   Eyes: Negative.   Respiratory: Negative.   Cardiovascular: Negative.   Gastrointestinal: Negative.   Musculoskeletal: Negative.   Skin: Negative.   Neurological: Negative.   Psychiatric/Behavioral: Positive for memory loss and substance  abuse. The patient is nervous/anxious and has insomnia.    Blood pressure 134/84, pulse 78, temperature 98.3 F (36.8 C), resp. rate 17, height 5\' 10"  (1.778 m), weight 132.5 kg, SpO2 98 %. Body mass  index is 41.9 kg/m.  Treatment Plan Summary: Plan Patient reports he is feeling much better.  He is not feeling back to his baseline but also is not feeling confused.  He is certainly not delirious but was pleasant and cooperative.  He is not reporting any suicidal ideation.  He does express a desire to get into appropriate treatment for alcohol abuse.  I have restarted the Lexapro that he normally takes.  Not sure if he will still be in the hospital after the weekend but if he is discharged the appropriate recommended follow-up would be with [facility].  I will provide that information on the chart.  Disposition: No evidence of imminent risk to self or others at present.   Patient does not meet criteria for psychiatric inpatient admission. Supportive therapy provided about ongoing stressors. Discussed crisis plan, support from social network, calling 911, coming to the Emergency Department, and calling Suicide Hotline.  Mordecai Rasmussen, MD 10/13/2020 6:19 PM

## 2020-10-14 DIAGNOSIS — Z720 Tobacco use: Secondary | ICD-10-CM | POA: Diagnosis not present

## 2020-10-14 DIAGNOSIS — F10920 Alcohol use, unspecified with intoxication, uncomplicated: Secondary | ICD-10-CM | POA: Diagnosis not present

## 2020-10-14 DIAGNOSIS — F10230 Alcohol dependence with withdrawal, uncomplicated: Secondary | ICD-10-CM | POA: Diagnosis not present

## 2020-10-14 LAB — BASIC METABOLIC PANEL
Anion gap: 8 (ref 5–15)
BUN: 13 mg/dL (ref 6–20)
CO2: 25 mmol/L (ref 22–32)
Calcium: 8.8 mg/dL — ABNORMAL LOW (ref 8.9–10.3)
Chloride: 105 mmol/L (ref 98–111)
Creatinine, Ser: 0.76 mg/dL (ref 0.61–1.24)
GFR, Estimated: >60 mL/min (ref >60)
Glucose, Bld: 99 mg/dL (ref 70–99)
Potassium: 3.3 mmol/L — ABNORMAL LOW (ref 3.5–5.1)
Sodium: 138 mmol/L (ref 135–145)

## 2020-10-14 MED ORDER — POTASSIUM CHLORIDE CRYS ER 20 MEQ PO TBCR
40.0000 meq | EXTENDED_RELEASE_TABLET | Freq: Once | ORAL | Status: AC
  Administered 2020-10-14: 40 meq via ORAL
  Filled 2020-10-14: qty 2

## 2020-10-14 NOTE — Progress Notes (Signed)
PROGRESS NOTE    Nicholas Valdez  WNU:[id number] DOB: 10-20-1980 DOA: 10/11/2020 PCP: Oswaldo Conroy, MD   Brief Narrative: Taken from H&P and chart review. Nicholas Valdez is a 40 y.o. male with medical history significant for alcohol use, depression, GERD, hepatitis C, hypertension, prediabetes and sleep apnea who presented to the emergency room with suspected alcohol withdrawal for detoxification.  Apparently patient had a very complicated course of alcohol withdrawal in the later half of 2021, where he required intubation, multiple days of Precedex, trached and pegged which has been removed now.  Due to some personal stresses he relapsed and started drinking again in February 2022.  Now wants to have a detox again.  Cannot be done at day behavioral health facility due to his prior complicated alcohol withdrawal history.  According to note by psychiatry he was drinking 30 ounces of wine every day and his last drink was before coming to the emergency room.  Also had an history of auditory hallucinations. Uses marijuana but denies any other drug use.  He was started on CIWA protocol with Ativan and Librium.  Psychiatry was also consulted.  Subjective: Patient remained stable.  He thinks that Librium is working fine, discussed that we will observe him for another day and if remains stable will be discharged tomorrow. We are just doing it as precautionary measure due to his recent bad alcohol withdrawal history.  Assessment & Plan:   Principal Problem:   Alcohol withdrawal (HCC) Active Problems:   Alcohol withdrawal delirium (HCC)   Tobacco abuse   Alcoholic intoxication without complication (HCC)   Dysthymia   History of alcohol abuse now for detox.  CIWA score of 5 this morning.  Last drink was on 10/11/2020 just before coming to the hospital, ethanol levels elevated at 240 on admission.  UDS ordered but still pending. Patient is high risk for life-threatening alcohol  withdrawal due to his prior history when he required intubation, trach and PEG.  Trach and PEG has been removed. -Continue with CIWA protocol with Ativan and Librium. -Close monitoring if worsening of symptoms-low threshold to transfer him to ICU. -Continue with supplements which include thiamine, folic acid and multivitamin. -Appreciate psychiatry help. -If remains stable can be discharged home on Librium taper tomorrow  Nausea and vomiting with epigastric pain on admission.  Resolved.  Initially received IV Protonix which was switched with p.o. -Continue with p.o. Protonix  Ongoing tobacco abuse. -Continue with nicotine patch  History of anxiety and depression.  Per chart review he was started on multiple antipsychotics at the time of prior admission.  Psych was consulted.  There is some concern of auditory hallucinations which get worse with intoxication. -Currently being managed with Ativan per CIWA protocol  History of B12 deficiency. -Continue with B12 supplement.  Hypokalemia.  Magnesium at 2 -Monitor potassium and replete as needed.  Left heel pressure injury.  Patient had about 2 cm unstageable pressure injury on left heel, no obvious drainage.  Thick unviable tissue. -Wound care consult-getting bandage and chemical sloughing.  Stage III obesity. Body mass index is 41.9 kg/m.   Objective: Vitals:   10/14/20 0504 10/14/20 0801 10/14/20 1229 10/14/20 1522  BP: 107/77 112/82 119/81 117/73  Pulse: 65 62 82 89  Resp:      Temp:  98.1 F (36.7 C) 98 F (36.7 C) 97.9 F (36.6 C)  TempSrc:  Oral Oral Oral  SpO2: 97% 98% 97% 99%  Weight:      Height:  Intake/Output Summary (Last 24 hours) at 10/14/2020 1529 Last data filed at 10/14/2020 1400 Gross per 24 hour  Intake 720 ml  Output --  Net 720 ml   Filed Weights   10/11/20 2038  Weight: 132.5 kg    Examination:  General.  Morbidly obese gentleman, in no acute distress. Pulmonary.  Lungs clear  bilaterally, normal respiratory effort. CV.  Regular rate and rhythm, no JVD, rub or murmur. Abdomen.  Soft, nontender, nondistended, BS positive. CNS.  Alert and oriented x3.  No focal neurologic deficit. Extremities.  No edema, no cyanosis, pulses intact and symmetrical. Psychiatry.  Judgment and insight appears normal.  DVT prophylaxis: Lovenox Code Status: Full Family Communication: Discussed with patient Disposition Plan:  Status is: Inpatient  Remains inpatient appropriate because:Inpatient level of care appropriate due to severity of illness   Dispo: The patient is from: Home              Anticipated d/c is to: Home              Patient currently is not medically stable to d/c.   Difficult to place patient No              Level of care: Med-Surg  All the records are reviewed and case discussed with Care Management/Social Worker. Management plans discussed with the patient, nursing and they are in agreement.  Consultants:   Psychiatry  Procedures:  Antimicrobials:   Data Reviewed: I have personally reviewed following labs and imaging studies  CBC: Recent Labs  Lab 10/11/20 2042 10/12/20 0731  WBC 9.8 7.8  HGB 15.8 13.3  HCT 44.8 39.2  MCV 88.7 90.1  PLT 301 207   Basic Metabolic Panel: Recent Labs  Lab 10/11/20 2042 10/12/20 0731 10/14/20 0437  NA 139 141 138  K 3.5 3.2* 3.3*  CL 107 109 105  CO2 21* 25 25  GLUCOSE 141* 107* 99  BUN 10 10 13   CREATININE 1.11 0.70 0.76  CALCIUM 8.3* 7.9* 8.8*  MG  --  2.0  --    GFR: Estimated Creatinine Clearance: 173.1 mL/min (by C-G formula based on SCr of 0.76 mg/dL). Liver Function Tests: Recent Labs  Lab 10/11/20 2042  AST 33  ALT 26  ALKPHOS 67  BILITOT 0.6  PROT 7.5  ALBUMIN 4.1   No results for input(s): LIPASE, AMYLASE in the last 168 hours. No results for input(s): AMMONIA in the last 168 hours. Coagulation Profile: No results for input(s): INR, PROTIME in the last 168 hours. Cardiac  Enzymes: No results for input(s): CKTOTAL, CKMB, CKMBINDEX, TROPONINI in the last 168 hours. BNP (last 3 results) No results for input(s): PROBNP in the last 8760 hours. HbA1C: No results for input(s): HGBA1C in the last 72 hours. CBG: No results for input(s): GLUCAP in the last 168 hours. Lipid Profile: No results for input(s): CHOL, HDL, LDLCALC, TRIG, CHOLHDL, LDLDIRECT in the last 72 hours. Thyroid Function Tests: No results for input(s): TSH, T4TOTAL, FREET4, T3FREE, THYROIDAB in the last 72 hours. Anemia Panel: No results for input(s): VITAMINB12, FOLATE, FERRITIN, TIBC, IRON, RETICCTPCT in the last 72 hours. Sepsis Labs: No results for input(s): PROCALCITON, LATICACIDVEN in the last 168 hours.  Recent Results (from the past 240 hour(s))  Resp Panel by RT-PCR (Flu A&B, Covid) Nasopharyngeal Swab     Status: None   Collection Time: 10/11/20 10:46 PM   Specimen: Nasopharyngeal Swab; Nasopharyngeal(NP) swabs in vial transport medium  Result Value Ref Range Status  SARS Coronavirus 2 by RT PCR NEGATIVE NEGATIVE Final    Comment: (NOTE) SARS-CoV-2 target nucleic acids are NOT DETECTED.  The SARS-CoV-2 RNA is generally detectable in upper respiratory specimens during the acute phase of infection. The lowest concentration of SARS-CoV-2 viral copies this assay can detect is 138 copies/mL. A negative result does not preclude SARS-Cov-2 infection and should not be used as the sole basis for treatment or other patient management decisions. A negative result may occur with  improper specimen collection/handling, submission of specimen other than nasopharyngeal swab, presence of viral mutation(s) within the areas targeted by this assay, and inadequate number of viral copies(<138 copies/mL). A negative result must be combined with clinical observations, patient history, and epidemiological information. The expected result is Negative.  Fact Sheet for Patients:   BloggerCourse.com  Fact Sheet for Healthcare Providers:  SeriousBroker.it  This test is no t yet approved or cleared by the [place] FDA and  has been authorized for detection and/or diagnosis of SARS-CoV-2 by FDA under an Emergency Use Authorization (EUA). This EUA will remain  in effect (meaning this test can be used) for the duration of the COVID-19 declaration under Section 564(b)(1) of the Act, 21 U.S.C.section 360bbb-3(b)(1), unless the authorization is terminated  or revoked sooner.       Influenza A by PCR NEGATIVE NEGATIVE Final   Influenza B by PCR NEGATIVE NEGATIVE Final    Comment: (NOTE) The Xpert Xpress SARS-CoV-2/FLU/RSV plus assay is intended as an aid in the diagnosis of influenza from Nasopharyngeal swab specimens and should not be used as a sole basis for treatment. Nasal washings and aspirates are unacceptable for Xpert Xpress SARS-CoV-2/FLU/RSV testing.  Fact Sheet for Patients: BloggerCourse.com  Fact Sheet for Healthcare Providers: SeriousBroker.it  This test is not yet approved or cleared by the [place] FDA and has been authorized for detection and/or diagnosis of SARS-CoV-2 by FDA under an Emergency Use Authorization (EUA). This EUA will remain in effect (meaning this test can be used) for the duration of the COVID-19 declaration under Section 564(b)(1) of the Act, 21 U.S.C. section 360bbb-3(b)(1), unless the authorization is terminated or revoked.  Performed at [facility], [address] [place], [place] [address]      Radiology Studies: No results found.  Scheduled Meds: . calcium carbonate  500 mg of elemental calcium Oral BID WC  . chlordiazePOXIDE  25 mg Oral QID   Followed by  . [START ON 10/15/2020] chlordiazePOXIDE  25 mg Oral TID   Followed by  . [START ON 10/16/2020] chlordiazePOXIDE  25 mg Oral BID   Followed  by  . [START ON 10/17/2020] chlordiazePOXIDE  25 mg Oral QHS  . enoxaparin (LOVENOX) injection  0.5 mg/kg Subcutaneous Q24H  . escitalopram  20 mg Oral Daily  . folic acid  1 mg Oral Daily  . furosemide  40 mg Oral Daily  . gabapentin  600 mg Oral Q8H  . LORazepam  0-4 mg Intravenous Q12H   Or  . LORazepam  0-4 mg Oral Q12H  . magnesium oxide  400 mg Oral BID  . melatonin  5 mg Oral QHS  . multivitamin with minerals  1 tablet Oral Daily  . nicotine  21 mg Transdermal Daily  . thiamine  100 mg Oral Daily   Or  . thiamine  100 mg Intravenous Daily  . traZODone  50 mg Oral QHS  . cyanocobalamin  1,000 mcg Oral Daily   Continuous Infusions: . sodium chloride Stopped (  10/12/20 1400)     LOS: 3 days   Time spent: 25 minutes. More than 50% of the time was spent in counseling/coordination of care  Arnetha Courser, MD [facility]  If 7PM-7AM, please contact night-coverage [email or web address]  10/14/2020, 3:29 PM   This record has been created using Conservation officer, historic buildings. Errors have been sought and corrected,but may not always be located. Such creation errors do not reflect on the standard of care.

## 2020-10-15 DIAGNOSIS — F10231 Alcohol dependence with withdrawal delirium: Principal | ICD-10-CM

## 2020-10-15 DIAGNOSIS — F341 Dysthymic disorder: Secondary | ICD-10-CM

## 2020-10-15 MED ORDER — NICOTINE 21 MG/24HR TD PT24: 21.0000 mg | MEDICATED_PATCH | Freq: Every day | TRANSDERMAL | 0 refills | Status: AC

## 2020-10-15 MED ORDER — THIAMINE HCL 100 MG PO TABS: 100.0000 mg | ORAL_TABLET | Freq: Every day | ORAL | 0 refills | Status: AC

## 2020-10-15 MED ORDER — FOLIC ACID 1 MG PO TABS: 1.0000 mg | ORAL_TABLET | Freq: Every day | ORAL | 1 refills | Status: AC

## 2020-10-15 MED ORDER — CHLORDIAZEPOXIDE HCL 25 MG PO CAPS: 25.0000 mg | ORAL_CAPSULE | Freq: Three times a day (TID) | ORAL | 0 refills | Status: AC

## 2020-10-15 MED ORDER — ADULT MULTIVITAMIN W/MINERALS CH: 1.0000 | ORAL_TABLET | Freq: Every day | ORAL | 1 refills | Status: AC

## 2020-10-15 MED ORDER — MAGNESIUM OXIDE 400 (241.3 MG) MG PO TABS: 400.0000 mg | ORAL_TABLET | Freq: Two times a day (BID) | ORAL | 0 refills | Status: AC

## 2020-10-15 MED ORDER — CYANOCOBALAMIN 1000 MCG PO TABS: 1000.0000 ug | ORAL_TABLET | Freq: Every day | ORAL | 1 refills | Status: AC

## 2020-10-15 MED ORDER — GABAPENTIN 300 MG PO CAPS: 600.0000 mg | ORAL_CAPSULE | Freq: Three times a day (TID) | ORAL | 0 refills | Status: AC

## 2020-10-15 MED ORDER — FUROSEMIDE 40 MG PO TABS: 40.0000 mg | ORAL_TABLET | Freq: Every day | ORAL | 0 refills | Status: AC | PRN

## 2020-10-15 MED ORDER — CALCIUM CARBONATE 1250 (500 CA) MG PO TABS: 1.0000 | ORAL_TABLET | Freq: Two times a day (BID) | ORAL | 3 refills | Status: AC

## 2020-10-15 MED ORDER — MELATONIN 5 MG PO TABS: 5.0000 mg | ORAL_TABLET | Freq: Every day | ORAL | 0 refills | Status: AC

## 2020-10-15 NOTE — Progress Notes (Signed)
   10/15/20 0046  Assess: MEWS Score  Pulse Rate (!) 132  Assess: MEWS Score  MEWS Temp 0  MEWS Systolic 0  MEWS Pulse 3  MEWS RR 0  MEWS LOC 0  MEWS Score 3  MEWS Score Color Yellow  Assess: if the MEWS score is Yellow or Red  Were vital signs taken at a resting state? No  Focused Assessment Change from prior assessment (see assessment flowsheet)  Early Detection of Sepsis Score *See Row Information* Low  MEWS guidelines implemented *See Row Information* No, other (Comment) (CIWA guidelines being followed)  Document  Patient Outcome  (Vital signs will continue to be checked)  Progress note created (see row info) Yes  Vital signs taken and within normal limits

## 2020-10-15 NOTE — Discharge Summary (Signed)
Physician Discharge Summary  Nicholas Valdez ZOX:[id number] DOB: August 23, 1980 DOA: 10/11/2020  PCP: Nicholas Conroy, MD  Admit date: 10/11/2020 Discharge date: 10/15/2020  Admitted From: Home Disposition: Home  Recommendations for Outpatient Follow-up:  1. Follow up with PCP in 1-2 weeks 2. Please obtain BMP/CBC in one week 3. Please follow up on the following pending results: None  Home Health: No Equipment/Devices: None Discharge Condition: Stable CODE STATUS: Full Diet recommendation: Heart Healthy   Brief/Interim Summary: Taken from H&P and chart review. Nicholas Garbe Johnsonis a 39 y.o.malewith medical history significant foralcohol use, depression, GERD, hepatitis C, hypertension, prediabetes and sleep apnea who presented to the emergency room with suspected alcohol withdrawal for detoxification.  Apparently patient had a very complicated course of alcohol withdrawal in the later half of 2021, where he required intubation, multiple days of Precedex, trached and pegged which has been removed now.  Due to some personal stresses he relapsed and started drinking again in February 2022.  Now wants to have a detox again.  Cannot be done at day behavioral health facility due to his prior complicated alcohol withdrawal history.  According to note by psychiatry he was drinking 30 ounces of wine every day and his last drink was before coming to the emergency room.  Also had an history of auditory hallucinations. Uses marijuana but denies any other drug use.  He was started on CIWA protocol with Ativan and Librium.  Psychiatry was also consulted. Patient remained stable and CIWA never exceeded more than 10 with the help of Ativan and Librium.  Did not required Ativan for the past 2 days and symptoms well controlled on Librium only.  Patient was discharged on a tapering dose of Librium and will follow up with psychiatry and PCP for further recommendations. He was counseled extensively  against alcohol and tobacco abuse.  He was also provided with resources to help stay sober.  He will continue with his supplements.  Patient had a left heel pressure injury from his complicated prolonged prior hospitalization.  Wound care was consulted and they were recommending bandage changes with saline. There was no sign of infection.  Wound seems healing, his PCP can send him to a wound care clinic if needed.  Discharge Diagnoses:  Principal Problem:   Alcohol withdrawal (HCC) Active Problems:   Alcohol withdrawal delirium (HCC)   Tobacco abuse   Alcoholic intoxication without complication (HCC)   Dysthymia   Discharge Instructions  Discharge Instructions    Diet - low sodium heart healthy   Complete by: As directed    Discharge instructions   Complete by: As directed    It was pleasure taking care of you. You are being given Librium taper, please take 1 tablet 3 times a day for 1 day, followed by 2 tablets a day and then only take at bedtime for next 2 to 3 days.  You can continue for next couple of days if needed. We make sure Lasix only to be used as needed for swelling or edema. Continue taking your supplements. It is very important that she stay away from alcohol and try to quit smoking, you are also being given a prescription for nicotine patch to help. Follow-up closely with your primary care doctor and psychiatrist for further management.   Discharge wound care:   Complete by: As directed    Cleanse with soap and water, rinse and pat dry.  Apply a saline moistened single gauze 2x2 (opened) to wound, top with dry gauze  2x2s and secure with Medipore Tape.  Change twice daily.   Increase activity slowly   Complete by: As directed      Allergies as of 10/15/2020      Reactions   Penicillins Hives   Did it involve swelling of the face/tongue/throat, SOB, or low BP? Yes Did it involve sudden or severe rash/hives, skin peeling, or any reaction on the inside of your mouth  or nose? Yes Did you need to seek medical attention at a hospital or doctor's office? Yes When did it last happen?childhood If all above answers are "NO", may proceed with cephalosporin use. Patient tolerated Ancef administration well   Zofran [ondansetron] Hives      Medication List    TAKE these medications   calcium carbonate 1250 (500 Ca) MG tablet Commonly known as: OS-CAL - dosed in mg of elemental calcium Take 1 tablet (500 mg of elemental calcium total) by mouth 2 (two) times daily with a meal.   chlordiazePOXIDE 25 MG capsule Commonly known as: LIBRIUM Take 1 capsule (25 mg total) by mouth 3 (three) times daily. For 1 day, then take twice a day for next day and then make it only at bedtime for next 2 days, you can use for next couple of days if needed.   cyanocobalamin 1000 MCG tablet Take 1 tablet (1,000 mcg total) by mouth daily.   escitalopram 20 MG tablet Commonly known as: LEXAPRO Take 20 mg by mouth daily.   folic acid 1 MG tablet Commonly known as: FOLVITE Take 1 tablet (1 mg total) by mouth daily.   furosemide 40 MG tablet Commonly known as: LASIX Take 1 tablet (40 mg total) by mouth daily as needed for fluid or edema. What changed:   when to take this  reasons to take this   gabapentin 300 MG capsule Commonly known as: NEURONTIN Take 2 capsules (600 mg total) by mouth every 8 (eight) hours.   magnesium oxide 400 (241.3 Mg) MG tablet Commonly known as: MAG-OX Take 1 tablet (400 mg total) by mouth 2 (two) times daily.   melatonin 5 MG Tabs Take 1 tablet (5 mg total) by mouth at bedtime.   multivitamin with minerals Tabs tablet Take 1 tablet by mouth daily.   nicotine 21 mg/24hr patch Commonly known as: NICODERM CQ - dosed in mg/24 hours Place 1 patch (21 mg total) onto the skin daily.   thiamine 100 MG tablet Take 1 tablet (100 mg total) by mouth daily.            Discharge Care Instructions  (From admission, onward)          Start     Ordered   10/15/20 0000  Discharge wound care:       Comments: Cleanse with soap and water, rinse and pat dry.  Apply a saline moistened single gauze 2x2 (opened) to wound, top with dry gauze 2x2s and secure with Medipore Tape.  Change twice daily.   10/15/20 0958          Follow-up Information    Bender, Earl Lagos, MD. Schedule an appointment as soon as possible for a visit in 1 week(s).   Specialty: Family Medicine Contact information: [address] [place] [address] [phone]              Allergies  Allergen Reactions  . Penicillins Hives    Did it involve swelling of the face/tongue/throat, SOB, or low BP? Yes Did it involve sudden  or severe rash/hives, skin peeling, or any reaction on the inside of your mouth or nose? Yes Did you need to seek medical attention at a hospital or doctor's office? Yes When did it last happen?childhood If all above answers are "NO", may proceed with cephalosporin use.  Patient tolerated Ancef administration well  . Zofran [Ondansetron] Hives    Consultations:  Psychiatry  Procedures/Studies:  No results found.  Subjective: Patient was seen and examined today.  No new complaint.  Denies any agitation. He thinks he can go home on a tapering dose of Librium.  Instructions provided.  Discharge Exam: Vitals:   10/15/20 0518 10/15/20 0821  BP: 112/76 94/65  Pulse: 71 75  Resp: 19 16  Temp: (!) 97.5 F (36.4 C) (!) 97.5 F (36.4 C)  SpO2: 97% 96%   Vitals:   10/15/20 0046 10/15/20 0125 10/15/20 0518 10/15/20 0821  BP:   112/76 94/65  Pulse: (!) 132 78 71 75  Resp:   19 16  Temp:   (!) 97.5 F (36.4 C) (!) 97.5 F (36.4 C)  TempSrc:   Oral Oral  SpO2:  98% 97% 96%  Weight:      Height:        General: Pt is alert, awake, not in acute distress Cardiovascular: RRR, S1/S2 +, no rubs, no gallops Respiratory: CTA bilaterally, no wheezing, no rhonchi Abdominal: Soft, NT, ND, bowel  sounds + Extremities: no edema, no cyanosis   The results of significant diagnostics from this hospitalization (including imaging, microbiology, ancillary and laboratory) are listed below for reference.    Microbiology: Recent Results (from the past 240 hour(s))  Resp Panel by RT-PCR (Flu A&B, Covid) Nasopharyngeal Swab     Status: None   Collection Time: 10/11/20 10:46 PM   Specimen: Nasopharyngeal Swab; Nasopharyngeal(NP) swabs in vial transport medium  Result Value Ref Range Status   SARS Coronavirus 2 by RT PCR NEGATIVE NEGATIVE Final    Comment: (NOTE) SARS-CoV-2 target nucleic acids are NOT DETECTED.  The SARS-CoV-2 RNA is generally detectable in upper respiratory specimens during the acute phase of infection. The lowest concentration of SARS-CoV-2 viral copies this assay can detect is 138 copies/mL. A negative result does not preclude SARS-Cov-2 infection and should not be used as the sole basis for treatment or other patient management decisions. A negative result may occur with  improper specimen collection/handling, submission of specimen other than nasopharyngeal swab, presence of viral mutation(s) within the areas targeted by this assay, and inadequate number of viral copies(<138 copies/mL). A negative result must be combined with clinical observations, patient history, and epidemiological information. The expected result is Negative.  Fact Sheet for Patients:  BloggerCourse.com  Fact Sheet for Healthcare Providers:  SeriousBroker.it  This test is no t yet approved or cleared by the [place] FDA and  has been authorized for detection and/or diagnosis of SARS-CoV-2 by FDA under an Emergency Use Authorization (EUA). This EUA will remain  in effect (meaning this test can be used) for the duration of the COVID-19 declaration under Section 564(b)(1) of the Act, 21 U.S.C.section 360bbb-3(b)(1), unless the  authorization is terminated  or revoked sooner.       Influenza A by PCR NEGATIVE NEGATIVE Final   Influenza B by PCR NEGATIVE NEGATIVE Final    Comment: (NOTE) The Xpert Xpress SARS-CoV-2/FLU/RSV plus assay is intended as an aid in the diagnosis of influenza from Nasopharyngeal swab specimens and should not be used as a sole basis for treatment. Nasal  washings and aspirates are unacceptable for Xpert Xpress SARS-CoV-2/FLU/RSV testing.  Fact Sheet for Patients: BloggerCourse.com  Fact Sheet for Healthcare Providers: SeriousBroker.it  This test is not yet approved or cleared by the [place] FDA and has been authorized for detection and/or diagnosis of SARS-CoV-2 by FDA under an Emergency Use Authorization (EUA). This EUA will remain in effect (meaning this test can be used) for the duration of the COVID-19 declaration under Section 564(b)(1) of the Act, 21 U.S.C. section 360bbb-3(b)(1), unless the authorization is terminated or revoked.  Performed at [facility], [address] [place], [place] [address]      Labs: BNP (last 3 results) Recent Labs    06/25/20 0047  BNP 494.9*   Basic Metabolic Panel: Recent Labs  Lab 10/11/20 2042 10/12/20 0731 10/14/20 0437  NA 139 141 138  K 3.5 3.2* 3.3*  CL 107 109 105  CO2 21* 25 25  GLUCOSE 141* 107* 99  BUN 10 10 13   CREATININE 1.11 0.70 0.76  CALCIUM 8.3* 7.9* 8.8*  MG  --  2.0  --    Liver Function Tests: Recent Labs  Lab 10/11/20 2042  AST 33  ALT 26  ALKPHOS 67  BILITOT 0.6  PROT 7.5  ALBUMIN 4.1   No results for input(s): LIPASE, AMYLASE in the last 168 hours. No results for input(s): AMMONIA in the last 168 hours. CBC: Recent Labs  Lab 10/11/20 2042 10/12/20 0731  WBC 9.8 7.8  HGB 15.8 13.3  HCT 44.8 39.2  MCV 88.7 90.1  PLT 301 207   Cardiac Enzymes: No results for input(s): CKTOTAL, CKMB, CKMBINDEX, TROPONINI in the last 168  hours. BNP: Invalid input(s): POCBNP CBG: No results for input(s): GLUCAP in the last 168 hours. D-Dimer No results for input(s): DDIMER in the last 72 hours. Hgb A1c No results for input(s): HGBA1C in the last 72 hours. Lipid Profile No results for input(s): CHOL, HDL, LDLCALC, TRIG, CHOLHDL, LDLDIRECT in the last 72 hours. Thyroid function studies No results for input(s): TSH, T4TOTAL, T3FREE, THYROIDAB in the last 72 hours.  Invalid input(s): FREET3 Anemia work up No results for input(s): VITAMINB12, FOLATE, FERRITIN, TIBC, IRON, RETICCTPCT in the last 72 hours. Urinalysis    Component Value Date/Time   COLORURINE YELLOW (A) 06/20/2020 0348   APPEARANCEUR CLEAR (A) 06/20/2020 0348   APPEARANCEUR Hazy 02/12/2013 2028   LABSPEC 1.015 06/20/2020 0348   LABSPEC 1.019 02/12/2013 2028   PHURINE 6.0 06/20/2020 0348   GLUCOSEU NEGATIVE 06/20/2020 0348   GLUCOSEU Negative 02/12/2013 2028   HGBUR NEGATIVE 06/20/2020 0348   BILIRUBINUR NEGATIVE 06/20/2020 0348   BILIRUBINUR Negative 02/12/2013 2028   KETONESUR NEGATIVE 06/20/2020 0348   PROTEINUR NEGATIVE 06/20/2020 0348   NITRITE NEGATIVE 06/20/2020 0348   LEUKOCYTESUR NEGATIVE 06/20/2020 0348   LEUKOCYTESUR Negative 02/12/2013 2028   Sepsis Labs Invalid input(s): PROCALCITONIN,  WBC,  LACTICIDVEN Microbiology Recent Results (from the past 240 hour(s))  Resp Panel by RT-PCR (Flu A&B, Covid) Nasopharyngeal Swab     Status: None   Collection Time: 10/11/20 10:46 PM   Specimen: Nasopharyngeal Swab; Nasopharyngeal(NP) swabs in vial transport medium  Result Value Ref Range Status   SARS Coronavirus 2 by RT PCR NEGATIVE NEGATIVE Final    Comment: (NOTE) SARS-CoV-2 target nucleic acids are NOT DETECTED.  The SARS-CoV-2 RNA is generally detectable in upper respiratory specimens during the acute phase of infection. The lowest concentration of SARS-CoV-2 viral copies this assay can detect is 138 copies/mL. A negative  result does  not preclude SARS-Cov-2 infection and should not be used as the sole basis for treatment or other patient management decisions. A negative result may occur with  improper specimen collection/handling, submission of specimen other than nasopharyngeal swab, presence of viral mutation(s) within the areas targeted by this assay, and inadequate number of viral copies(<138 copies/mL). A negative result must be combined with clinical observations, patient history, and epidemiological information. The expected result is Negative.  Fact Sheet for Patients:  BloggerCourse.comhttps://www.fda.gov/media/152166/download  Fact Sheet for Healthcare Providers:  SeriousBroker.ithttps://www.fda.gov/media/152162/download  This test is no t yet approved or cleared by the [place] FDA and  has been authorized for detection and/or diagnosis of SARS-CoV-2 by FDA under an Emergency Use Authorization (EUA). This EUA will remain  in effect (meaning this test can be used) for the duration of the COVID-19 declaration under Section 564(b)(1) of the Act, 21 U.S.C.section 360bbb-3(b)(1), unless the authorization is terminated  or revoked sooner.       Influenza A by PCR NEGATIVE NEGATIVE Final   Influenza B by PCR NEGATIVE NEGATIVE Final    Comment: (NOTE) The Xpert Xpress SARS-CoV-2/FLU/RSV plus assay is intended as an aid in the diagnosis of influenza from Nasopharyngeal swab specimens and should not be used as a sole basis for treatment. Nasal washings and aspirates are unacceptable for Xpert Xpress SARS-CoV-2/FLU/RSV testing.  Fact Sheet for Patients: BloggerCourse.comhttps://www.fda.gov/media/152166/download  Fact Sheet for Healthcare Providers: SeriousBroker.ithttps://www.fda.gov/media/152162/download  This test is not yet approved or cleared by the [place] FDA and has been authorized for detection and/or diagnosis of SARS-CoV-2 by FDA under an Emergency Use Authorization (EUA). This EUA will remain in effect (meaning this test can be used) for the  duration of the COVID-19 declaration under Section 564(b)(1) of the Act, 21 U.S.C. section 360bbb-3(b)(1), unless the authorization is terminated or revoked.  Performed at [facility], [address] [place], [place] [address]     Time coordinating discharge: Over 30 minutes  SIGNED:  Arnetha CourserSumayya Andrell Bergeson, MD  [facility] 10/15/2020, 9:59 AM  If 7PM-7AM, please contact night-coverage [email or web address]  This record has been created using Conservation officer, historic buildingsDragon voice recognition software. Errors have been sought and corrected,but may not always be located. Such creation errors do not reflect on the standard of care.

## 2020-10-15 NOTE — Plan of Care (Signed)
  Problem: Education: Goal: Knowledge of General Education information will improve Description: Including pain rating scale, medication(s)/side effects and non-pharmacologic comfort measures Outcome: Adequate for Discharge   Problem: Health Behavior/Discharge Planning: Goal: Ability to manage health-related needs will improve Outcome: Adequate for Discharge   Problem: Clinical Measurements: Goal: Ability to maintain clinical measurements within normal limits will improve Outcome: Adequate for Discharge Goal: Will remain free from infection Outcome: Adequate for Discharge Goal: Diagnostic test results will improve Outcome: Adequate for Discharge Goal: Respiratory complications will improve Outcome: Adequate for Discharge Goal: Cardiovascular complication will be avoided Outcome: Adequate for Discharge   Problem: Activity: Goal: Risk for activity intolerance will decrease Outcome: Adequate for Discharge   Problem: Nutrition: Goal: Adequate nutrition will be maintained Outcome: Adequate for Discharge   Problem: Coping: Goal: Level of anxiety will decrease Outcome: Adequate for Discharge   Problem: Elimination: Goal: Will not experience complications related to bowel motility Outcome: Adequate for Discharge Goal: Will not experience complications related to urinary retention Outcome: Adequate for Discharge   Problem: Pain Managment: Goal: General experience of comfort will improve Outcome: Adequate for Discharge   Problem: Safety: Goal: Ability to remain free from injury will improve Outcome: Adequate for Discharge   

## 2020-10-15 NOTE — Plan of Care (Signed)

## 2020-11-14 ENCOUNTER — Encounter: Payer: Self-pay | Admitting: Emergency Medicine

## 2020-11-14 ENCOUNTER — Other Ambulatory Visit: Payer: Self-pay

## 2020-11-14 ENCOUNTER — Emergency Department: Payer: Medicaid Other

## 2020-11-14 ENCOUNTER — Emergency Department
Admission: EM | Admit: 2020-11-14 | Discharge: 2020-11-14 | Disposition: A | Discharge status: Home / Self Care | Payer: Medicaid Other | Attending: Emergency Medicine | Admitting: Emergency Medicine

## 2020-11-14 DIAGNOSIS — A084 Viral intestinal infection, unspecified: Secondary | ICD-10-CM | POA: Diagnosis not present

## 2020-11-14 DIAGNOSIS — F10230 Alcohol dependence with withdrawal, uncomplicated: Secondary | ICD-10-CM

## 2020-11-14 DIAGNOSIS — F17210 Nicotine dependence, cigarettes, uncomplicated: Secondary | ICD-10-CM | POA: Diagnosis not present

## 2020-11-14 DIAGNOSIS — R110 Nausea: Secondary | ICD-10-CM | POA: Diagnosis present

## 2020-11-14 DIAGNOSIS — I10 Essential (primary) hypertension: Secondary | ICD-10-CM | POA: Diagnosis not present

## 2020-11-14 DIAGNOSIS — K2970 Gastritis, unspecified, without bleeding: Secondary | ICD-10-CM

## 2020-11-14 LAB — CBC WITH DIFFERENTIAL/PLATELET
Abs Immature Granulocytes: 0.02 10*3/uL (ref 0.00–0.07)
Basophils Absolute: 0.1 10*3/uL (ref 0.0–0.1)
Basophils Relative: 1 %
Eosinophils Absolute: 0.3 10*3/uL (ref 0.0–0.5)
Eosinophils Relative: 5 %
HCT: 38.9 % — ABNORMAL LOW (ref 39.0–52.0)
Hemoglobin: 13.9 g/dL (ref 13.0–17.0)
Immature Granulocytes: 0 %
Lymphocytes Relative: 51 %
Lymphs Abs: 2.7 10*3/uL (ref 0.7–4.0)
MCH: 32.3 pg (ref 26.0–34.0)
MCHC: 35.7 g/dL (ref 30.0–36.0)
MCV: 90.5 fL (ref 80.0–100.0)
Monocytes Absolute: 0.6 10*3/uL (ref 0.1–1.0)
Monocytes Relative: 12 %
Neutro Abs: 1.7 10*3/uL (ref 1.7–7.7)
Neutrophils Relative %: 31 %
Platelets: 221 10*3/uL (ref 150–400)
RBC: 4.3 MIL/uL (ref 4.22–5.81)
RDW: 15.6 % — ABNORMAL HIGH (ref 11.5–15.5)
WBC: 5.4 10*3/uL (ref 4.0–10.5)
nRBC: 0 % (ref 0.0–0.2)

## 2020-11-14 LAB — COMPREHENSIVE METABOLIC PANEL
ALT: 36 U/L (ref 0–44)
AST: 36 U/L (ref 15–41)
Albumin: 3.5 g/dL (ref 3.5–5.0)
Alkaline Phosphatase: 49 U/L (ref 38–126)
Anion gap: 10 (ref 5–15)
BUN: 9 mg/dL (ref 6–20)
CO2: 19 mmol/L — ABNORMAL LOW (ref 22–32)
Calcium: 8.3 mg/dL — ABNORMAL LOW (ref 8.9–10.3)
Chloride: 109 mmol/L (ref 98–111)
Creatinine, Ser: 0.8 mg/dL (ref 0.61–1.24)
GFR, Estimated: >60 mL/min (ref >60)
Glucose, Bld: 141 mg/dL — ABNORMAL HIGH (ref 70–99)
Potassium: 3.3 mmol/L — ABNORMAL LOW (ref 3.5–5.1)
Sodium: 138 mmol/L (ref 135–145)
Total Bilirubin: 0.6 mg/dL (ref 0.3–1.2)
Total Protein: 6.9 g/dL (ref 6.5–8.1)

## 2020-11-14 LAB — LIPASE, BLOOD: Lipase: 29 U/L (ref 11–51)

## 2020-11-14 MED ORDER — FAMOTIDINE 20 MG PO TABS
40.0000 mg | ORAL_TABLET | Freq: Once | ORAL | Status: AC
  Administered 2020-11-14: 40 mg via ORAL
  Filled 2020-11-14: qty 2

## 2020-11-14 MED ORDER — DICYCLOMINE HCL 10 MG PO CAPS: 10.0000 mg | ORAL_CAPSULE | Freq: Four times a day (QID) | ORAL | 0 refills | Status: AC | PRN

## 2020-11-14 MED ORDER — PROMETHAZINE HCL 25 MG PO TABS: 25.0000 mg | ORAL_TABLET | Freq: Four times a day (QID) | ORAL | 0 refills | Status: AC | PRN

## 2020-11-14 MED ORDER — CHLORDIAZEPOXIDE HCL 25 MG PO CAPS: ORAL_CAPSULE | ORAL | 0 refills | Status: AC

## 2020-11-14 MED ORDER — DICYCLOMINE HCL 10 MG PO CAPS: 20.0000 mg | ORAL_CAPSULE | Freq: Four times a day (QID) | ORAL | Status: DC | PRN

## 2020-11-14 MED ORDER — CHLORDIAZEPOXIDE HCL 25 MG PO CAPS
25.0000 mg | ORAL_CAPSULE | Freq: Once | ORAL | Status: AC
  Administered 2020-11-14: 25 mg via ORAL
  Filled 2020-11-14: qty 1

## 2020-11-14 NOTE — ED Notes (Signed)
Requesting food. MD aware and approves. Given snack

## 2020-11-14 NOTE — ED Notes (Signed)
MD at bedside. 

## 2020-11-14 NOTE — ED Notes (Signed)
C/o nasal congestion since Friday, awaiting PCR COVID test results. Also reports stopping drinking alcohol, last drink was 10PM last night 11/13/20. Usually drinks 48-60oz wine per day. Called to [facility] and referred to ER for "higher level of care". Hx of intubation due to withdrawal sx. Denies SI or HI. Reports marijuana use "when I have the money"

## 2020-11-14 NOTE — ED Provider Notes (Signed)
[facility] Emergency Department Provider Note  ____________________________________________  Time seen: Approximately 2:00 PM  I have reviewed the triage vital signs and the nursing notes.   HISTORY  Chief Complaint Alcohol Problem and Nasal Drainage    HPI Nicholas Valdez is a 39 y.o. male with a history of alcohol abuse, GERD, hypertension, obesity who comes the ED complaining of nasal congestion, nausea and poor oral intake for the past 4 days.  He drinks daily, about 10 drinks of wine, last alcohol was about 12 hours prior to arrival in the ED.  No diarrhea.  No focal pain.  Denies flushing or sweating, feels somewhat anxious and agitated.   Denies vision changes or hallucinations.       Past Medical History:  Diagnosis Date  . Alcohol abuse   . Anxiety   . Depression   . Drug abuse (HCC)   . GERD (gastroesophageal reflux disease)   . Hepatitis C   . Hypertension   . Pneumonia   . Pre-diabetes   . Sleep apnea      Patient Active Problem List   Diagnosis Date Noted  . Dysthymia 10/13/2020  . Alcoholic intoxication without complication (HCC)   . Dysphagia   . Hypoalbuminemia due to protein-calorie malnutrition (HCC)   . Left foot pain   . Peripheral edema   . Impulsive   . Acute encephalopathy   . Hyperammonemia (HCC)   . Morbid obesity (HCC)   . Sleep disturbance   . Acute on chronic anemia   . Status post tracheostomy (HCC)   . S/P percutaneous endoscopic gastrostomy (PEG) tube placement (HCC)   . Pressure injury of skin 07/25/2020  . Acute respiratory failure with hypoxia (HCC)   . Elevated lactic acid level 06/20/2020  . Alcohol withdrawal delirium (HCC) 06/19/2020  . Increased ammonia level 06/19/2020  . Acute metabolic encephalopathy 06/19/2020  . Elevated LFTs 06/19/2020  . Tobacco abuse 06/19/2020  . Alcohol withdrawal (HCC) 12/15/2018     Past Surgical History:  Procedure Laterality Date  . GASTROSTOMY W/  FEEDING TUBE Left 07/22/2020  . TRACHEOSTOMY TUBE PLACEMENT N/A 07/10/2020   Procedure: TRACHEOSTOMY;  Surgeon: Bud Face, MD;  Location: [facility] ORS;  Service: ENT;  Laterality: N/A;     Prior to Admission medications   Medication Sig Start Date End Date Taking? Authorizing Provider  chlordiazePOXIDE (LIBRIUM) 25 MG capsule Take 1 capsule (25 mg total) by mouth 3 (three) times daily for 2 days, THEN 1 capsule (25 mg total) 3 (three) times daily as needed for up to 3 days for anxiety or withdrawal. 11/14/20 11/19/20 Yes Sharman Cheek, MD  dicyclomine (BENTYL) 10 MG capsule Take 1 capsule (10 mg total) by mouth every 6 (six) hours as needed for up to 7 days for spasms (abdominal pain). 11/14/20 11/21/20 Yes Sharman Cheek, MD  promethazine (PHENERGAN) 25 MG tablet Take 1 tablet (25 mg total) by mouth every 6 (six) hours as needed for nausea or vomiting. 11/14/20  Yes Sharman Cheek, MD  calcium carbonate (OS-CAL - DOSED IN MG OF ELEMENTAL CALCIUM) 1250 (500 Ca) MG tablet Take 1 tablet (500 mg of elemental calcium total) by mouth 2 (two) times daily with a meal. 10/15/20   Arnetha Courser, MD  chlordiazePOXIDE (LIBRIUM) 25 MG capsule Take 1 capsule (25 mg total) by mouth 3 (three) times daily. For 1 day, then take twice a day for next day and then make it only at bedtime for next 2 days, you can  use for next couple of days if needed. 10/15/20   Arnetha Courser, MD  cyanocobalamin 1000 MCG tablet Take 1 tablet (1,000 mcg total) by mouth daily. 10/15/20   Arnetha Courser, MD  escitalopram (LEXAPRO) 20 MG tablet Take 20 mg by mouth daily. 11/25/19   [provider]  folic acid (FOLVITE) 1 MG tablet Take 1 tablet (1 mg total) by mouth daily. 10/15/20 10/15/21  Arnetha Courser, MD  furosemide (LASIX) 40 MG tablet Take 1 tablet (40 mg total) by mouth daily as needed for fluid or edema. 10/15/20   Arnetha Courser, MD  gabapentin (NEURONTIN) 300 MG capsule Take 2 capsules (600 mg total) by mouth every 8  (eight) hours. 10/15/20   Arnetha Courser, MD  magnesium oxide (MAG-OX) 400 (241.3 Mg) MG tablet Take 1 tablet (400 mg total) by mouth 2 (two) times daily. 10/15/20   Arnetha Courser, MD  melatonin 5 MG TABS Take 1 tablet (5 mg total) by mouth at bedtime. 10/15/20   Arnetha Courser, MD  Multiple Vitamin (MULTIVITAMIN WITH MINERALS) TABS tablet Take 1 tablet by mouth daily. 10/15/20   Arnetha Courser, MD  nicotine (NICODERM CQ - DOSED IN MG/24 HOURS) 21 mg/24hr patch Place 1 patch (21 mg total) onto the skin daily. 10/15/20   Arnetha Courser, MD  thiamine 100 MG tablet Take 1 tablet (100 mg total) by mouth daily. 10/15/20   Arnetha Courser, MD     Allergies Penicillins and Zofran [ondansetron]   Family History  Problem Relation Age of Onset  . Hypertension Other     Social History Social History   Tobacco Use  . Smoking status: Current Every Day Smoker    Packs/day: 1.00    Types: Cigarettes  . Smokeless tobacco: Never Used  Vaping Use  . Vaping Use: Former  Substance Use Topics  . Alcohol use: Yes    Comment: drinks 30oz wine a day  . Drug use: Yes    Types: Marijuana    Review of Systems  Constitutional:   No fever or chills.  ENT:   No sore throat. No rhinorrhea. Cardiovascular:   No chest pain or syncope. Respiratory:   No dyspnea or cough. Gastrointestinal:   Negative for abdominal pain, vomiting and diarrhea.  Musculoskeletal:   Negative for focal pain or swelling All other systems reviewed and are negative except as documented above in ROS and HPI.  ____________________________________________   PHYSICAL EXAM:  VITAL SIGNS: ED Triage Vitals  Enc Vitals Group     BP 11/14/20 0933 (!) 133/94     Pulse Rate 11/14/20 0933 (!) 103     Resp 11/14/20 0933 (!) 22     Temp 11/14/20 0933 98.4 F (36.9 C)     Temp Source 11/14/20 0933 Oral     SpO2 11/14/20 0933 99 %     Weight 11/14/20 0931 300 lb (136.1 kg)     Height 11/14/20 0931 5\' 10"  (1.778 m)     Head Circumference --       Peak Flow --      Pain Score 11/14/20 0931 0     Pain Loc --      Pain Edu? --      Excl. in GC? --     Vital signs reviewed, nursing assessments reviewed.   Constitutional:   Alert and oriented. Non-toxic appearance. Eyes:   Conjunctivae are normal. EOMI. PERRL. ENT      Head:   Normocephalic and atraumatic.  Nose:   Wearing a mask.      Mouth/Throat:   Wearing a mask.      Neck:   No meningismus. Full ROM. Hematological/Lymphatic/Immunilogical:   No cervical lymphadenopathy. Cardiovascular:   Tachycardia heart rate 105. Symmetric bilateral radial and DP pulses.  No murmurs. Cap refill less than 2 seconds. Respiratory:   Normal respiratory effort without tachypnea/retractions. Breath sounds are clear and equal bilaterally. No wheezes/rales/rhonchi. Gastrointestinal:   Soft and nontender. Non distended. There is no CVA tenderness.  No rebound, rigidity, or guarding. Genitourinary:   deferred Musculoskeletal:   Normal range of motion in all extremities. No joint effusions.  No lower extremity tenderness.  No edema. Neurologic:   Normal speech and language.  Motor grossly intact. No acute focal neurologic deficits are appreciated.  Skin:    Skin is warm, dry and intact. No rash noted.  No petechiae, purpura, or bullae.  ____________________________________________    LABS (pertinent positives/negatives) (all labs ordered are listed, but only abnormal results are displayed) Labs Reviewed  COMPREHENSIVE METABOLIC PANEL - Abnormal; Notable for the following components:      Result Value   Potassium 3.3 (*)    CO2 19 (*)    Glucose, Bld 141 (*)    Calcium 8.3 (*)    All other components within normal limits  CBC WITH DIFFERENTIAL/PLATELET - Abnormal; Notable for the following components:   HCT 38.9 (*)    RDW 15.6 (*)    All other components within normal limits  LIPASE, BLOOD    ____________________________________________   EKG    ____________________________________________    RADIOLOGY  DG Chest 2 View  Result Date: 11/14/2020 CLINICAL DATA:  Shortness of breath, nasal congestion, and cough since Friday, history of heavy ethanol intake EXAM: CHEST - 2 VIEW COMPARISON:  08/16/2020 FINDINGS: Normal heart size, mediastinal contours, and pulmonary vascularity. Minimal chronic peribronchial thickening. Lungs otherwise clear. No infiltrate, pleural effusion, or pneumothorax. Tracheostomy tube seen on previous exam removed. No acute osseous findings. IMPRESSION: Minimal chronic bronchitic changes without infiltrate. Electronically Signed   By: Ulyses Southward M.D.   On: 11/14/2020 11:29    ____________________________________________   PROCEDURES Procedures  ____________________________________________    CLINICAL IMPRESSION / ASSESSMENT AND PLAN / ED COURSE  Medications ordered in the ED: Medications  dicyclomine (BENTYL) capsule 20 mg (has no administration in time range)  chlordiazePOXIDE (LIBRIUM) capsule 25 mg (25 mg Oral Given 11/14/20 1004)  famotidine (PEPCID) tablet 40 mg (40 mg Oral Given 11/14/20 1004)    Pertinent labs & imaging results that were available during my care of the patient were reviewed by me and considered in my medical decision making (see chart for details).  AMARIYON MAYNES was evaluated in Emergency Department on 11/14/2020 for the symptoms described in the history of present illness. He was evaluated in the context of the global COVID-19 pandemic, which necessitated consideration that the patient might be at risk for infection with the SARS-CoV-2 virus that causes COVID-19. Institutional protocols and algorithms that pertain to the evaluation of patients at risk for COVID-19 are in a state of rapid change based on information released by regulatory bodies including the CDC and federal and state organizations. These policies and  algorithms were followed during the patient's care in the ED.   Patient presents with mild alcohol withdrawal symptoms in the setting of likely viral illness/URI causing poor oral intake and decreased alcohol intake.  May also have a degree of gastritis.  Patient given Bentyl  and Librium in the ED, feeling much better, tachycardia resolved.  Sleeping comfortably, tolerating oral intake.  Stable for discharge home on Librium taper, Phenergan, Bentyl.  He has outpatient alcohol treatment resources.      ____________________________________________   FINAL CLINICAL IMPRESSION(S) / ED DIAGNOSES    Final diagnoses:  Alcohol dependence with uncomplicated withdrawal (HCC)  Viral gastritis     ED Discharge Orders         Ordered    chlordiazePOXIDE (LIBRIUM) 25 MG capsule        11/14/20 1358    promethazine (PHENERGAN) 25 MG tablet  Every 6 hours PRN        11/14/20 1358    dicyclomine (BENTYL) 10 MG capsule  Every 6 hours PRN        11/14/20 1358          Portions of this note were generated with dragon dictation software. Dictation errors may occur despite best attempts at proofreading.   Sharman Cheek, MD 11/14/20 1406

## 2020-11-14 NOTE — ED Notes (Signed)
EDP at bedside  

## 2020-11-14 NOTE — ED Triage Notes (Signed)
Pt to ED via POV with c/o nasal congestion, was seen at PCP yesterday for same. Pt also c/o ETOH with withdrawal, states normally drinks 8-10 6ox bottles of wine, states last drink approx 12 hrs ago.

## 2021-02-24 ENCOUNTER — Encounter: Payer: Self-pay | Admitting: *Deleted

## 2021-02-24 ENCOUNTER — Emergency Department: Payer: Medicaid Other

## 2021-02-24 ENCOUNTER — Other Ambulatory Visit: Payer: Self-pay

## 2021-02-24 DIAGNOSIS — R5383 Other fatigue: Secondary | ICD-10-CM | POA: Diagnosis not present

## 2021-02-24 DIAGNOSIS — R197 Diarrhea, unspecified: Secondary | ICD-10-CM | POA: Insufficient documentation

## 2021-02-24 DIAGNOSIS — Z20822 Contact with and (suspected) exposure to covid-19: Secondary | ICD-10-CM | POA: Insufficient documentation

## 2021-02-24 DIAGNOSIS — R079 Chest pain, unspecified: Secondary | ICD-10-CM | POA: Diagnosis not present

## 2021-02-24 DIAGNOSIS — M25572 Pain in left ankle and joints of left foot: Secondary | ICD-10-CM | POA: Diagnosis not present

## 2021-02-24 DIAGNOSIS — Z5321 Procedure and treatment not carried out due to patient leaving prior to being seen by health care provider: Secondary | ICD-10-CM | POA: Insufficient documentation

## 2021-02-24 DIAGNOSIS — J029 Acute pharyngitis, unspecified: Secondary | ICD-10-CM | POA: Insufficient documentation

## 2021-02-24 LAB — CBC
HCT: 43.2 % (ref 39.0–52.0)
Hemoglobin: 15.3 g/dL (ref 13.0–17.0)
MCH: 35.5 pg — ABNORMAL HIGH (ref 26.0–34.0)
MCHC: 35.4 g/dL (ref 30.0–36.0)
MCV: 100.2 fL — ABNORMAL HIGH (ref 80.0–100.0)
Platelets: 315 K/uL (ref 150–400)
RBC: 4.31 MIL/uL (ref 4.22–5.81)
RDW: 12.5 % (ref 11.5–15.5)
WBC: 12.8 K/uL — ABNORMAL HIGH (ref 4.0–10.5)
nRBC: 0 % (ref 0.0–0.2)

## 2021-02-24 LAB — RESP PANEL BY RT-PCR (FLU A&B, COVID) ARPGX2
Influenza A by PCR: NEGATIVE
Influenza B by PCR: NEGATIVE
SARS Coronavirus 2 by RT PCR: NEGATIVE

## 2021-02-24 LAB — BASIC METABOLIC PANEL WITH GFR
Anion gap: 14 (ref 5–15)
BUN: 12 mg/dL (ref 6–20)
CO2: 18 mmol/L — ABNORMAL LOW (ref 22–32)
Calcium: 8.9 mg/dL (ref 8.9–10.3)
Chloride: 105 mmol/L (ref 98–111)
Creatinine, Ser: 1.08 mg/dL (ref 0.61–1.24)
GFR, Estimated: >60 mL/min
Glucose, Bld: 116 mg/dL — ABNORMAL HIGH (ref 70–99)
Potassium: 3.7 mmol/L (ref 3.5–5.1)
Sodium: 137 mmol/L (ref 135–145)

## 2021-02-24 LAB — TROPONIN I (HIGH SENSITIVITY): Troponin I (High Sensitivity): 3 ng/L (ref <18)

## 2021-02-24 LAB — GROUP A STREP BY PCR: Group A Strep by PCR: NOT DETECTED

## 2021-02-24 MED ORDER — ACETAMINOPHEN 325 MG PO TABS
650.0000 mg | ORAL_TABLET | Freq: Once | ORAL | Status: AC | PRN
  Administered 2021-02-24: 650 mg via ORAL
  Filled 2021-02-24: qty 2

## 2021-02-24 NOTE — ED Triage Notes (Signed)
Pt has multiple c/o pain. Sore throat starting this morning. Ankle pain starting x 2-3 days ago. Pt also c/o minor chest pain that is transient. Pt states "I don't know what is going on, I just feel like crap." Pt c/o extreme fatigue and exhaustion. Pt c/o diarrhea x 3 days. Pt has taken no meds for pain today.

## 2021-02-25 ENCOUNTER — Emergency Department
Admission: EM | Admit: 2021-02-25 | Discharge: 2021-02-25 | Disposition: A | Discharge status: Home / Self Care | Payer: Medicaid Other | Attending: Student in an Organized Health Care Education/Training Program | Admitting: Student in an Organized Health Care Education/Training Program

## 2021-04-24 ENCOUNTER — Encounter: Payer: Self-pay | Admitting: Emergency Medicine

## 2021-04-24 ENCOUNTER — Emergency Department: Payer: Medicaid Other

## 2021-04-24 ENCOUNTER — Emergency Department
Admission: EM | Admit: 2021-04-24 | Discharge: 2021-04-24 | Disposition: A | Discharge status: Home / Self Care | Payer: Medicaid Other | Attending: Emergency Medicine | Admitting: Emergency Medicine

## 2021-04-24 ENCOUNTER — Other Ambulatory Visit: Payer: Self-pay

## 2021-04-24 DIAGNOSIS — R059 Cough, unspecified: Secondary | ICD-10-CM | POA: Diagnosis present

## 2021-04-24 DIAGNOSIS — I10 Essential (primary) hypertension: Secondary | ICD-10-CM | POA: Diagnosis not present

## 2021-04-24 DIAGNOSIS — Z79899 Other long term (current) drug therapy: Secondary | ICD-10-CM | POA: Insufficient documentation

## 2021-04-24 DIAGNOSIS — U071 COVID-19: Secondary | ICD-10-CM | POA: Diagnosis not present

## 2021-04-24 DIAGNOSIS — F17210 Nicotine dependence, cigarettes, uncomplicated: Secondary | ICD-10-CM | POA: Insufficient documentation

## 2021-04-24 LAB — RESP PANEL BY RT-PCR (FLU A&B, COVID) ARPGX2
Influenza A by PCR: NEGATIVE
Influenza B by PCR: NEGATIVE
SARS Coronavirus 2 by RT PCR: POSITIVE — AB

## 2021-04-24 MED ORDER — NIRMATRELVIR/RITONAVIR (PAXLOVID)TABLET: 3.0000 | ORAL_TABLET | Freq: Two times a day (BID) | ORAL | 0 refills | Status: AC

## 2021-04-24 MED ORDER — BENZONATATE 100 MG PO CAPS: 100.0000 mg | ORAL_CAPSULE | Freq: Three times a day (TID) | ORAL | 0 refills | Status: AC | PRN

## 2021-04-24 NOTE — ED Provider Notes (Signed)
[facility] Emergency Department Provider Note  ____________________________________________   Event Date/Time   First MD Initiated Contact with Patient 04/24/21 [phone]     (approximate)  I have reviewed the triage vital signs and the nursing notes.   HISTORY  Chief Complaint URI    HPI Nicholas Valdez is a 40 y.o. male presents to the emergency department with URI symptoms for 2 days.   Is complaining of cough, congestion, fever, chills, denies chest pain, shortness of breath positive close contact with Covid19+ patient, patient is vaccinated.   Past Medical History:  Diagnosis Date   Alcohol abuse    Anxiety    Depression    Drug abuse (HCC)    GERD (gastroesophageal reflux disease)    Hepatitis C    Hypertension    Pneumonia    Pre-diabetes    Sleep apnea     Patient Active Problem List   Diagnosis Date Noted   Dysthymia 10/13/2020   Alcoholic intoxication without complication (HCC)    Dysphagia    Hypoalbuminemia due to protein-calorie malnutrition (HCC)    Left foot pain    Peripheral edema    Impulsive    Acute encephalopathy    Hyperammonemia (HCC)    Morbid obesity (HCC)    Sleep disturbance    Acute on chronic anemia    Status post tracheostomy (HCC)    S/P percutaneous endoscopic gastrostomy (PEG) tube placement (HCC)    Pressure injury of skin 07/25/2020   Acute respiratory failure with hypoxia (HCC)    Elevated lactic acid level 06/20/2020   Alcohol withdrawal delirium (HCC) 06/19/2020   Increased ammonia level 06/19/2020   Acute metabolic encephalopathy 06/19/2020   Elevated LFTs 06/19/2020   Tobacco abuse 06/19/2020   Alcohol withdrawal (HCC) 12/15/2018    Past Surgical History:  Procedure Laterality Date   GASTROSTOMY W/ FEEDING TUBE Left 07/22/2020   TRACHEOSTOMY TUBE PLACEMENT N/A 07/10/2020   Procedure: TRACHEOSTOMY;  Surgeon: Bud Face, MD;  Location: [facility] ORS;  Service: ENT;  Laterality: N/A;     Prior to Admission medications   Medication Sig Start Date End Date Taking? Authorizing Provider  benzonatate (TESSALON PERLES) 100 MG capsule Take 1 capsule (100 mg total) by mouth 3 (three) times daily as needed for cough. 04/24/21 04/24/22 Yes Angelyn Osterberg, Roselyn Bering, PA-C  nirmatrelvir/ritonavir EUA (PAXLOVID) 20 x 150 MG & 10 x 100MG  TABS Take 3 tablets by mouth 2 (two) times daily for 5 days. Patient GFR is >60. Take nirmatrelvir (150 mg) two tablets twice daily for 5 days and ritonavir (100 mg) one tablet twice daily for 5 days. 04/24/21 04/29/21 Yes Jimia Gentles, 05/01/21, PA-C  calcium carbonate (OS-CAL - DOSED IN MG OF ELEMENTAL CALCIUM) 1250 (500 Ca) MG tablet Take 1 tablet (500 mg of elemental calcium total) by mouth 2 (two) times daily with a meal. 10/15/20   10/17/20, MD  chlordiazePOXIDE (LIBRIUM) 25 MG capsule Take 1 capsule (25 mg total) by mouth 3 (three) times daily. For 1 day, then take twice a day for next day and then make it only at bedtime for next 2 days, you can use for next couple of days if needed. 10/15/20   10/17/20, MD  cyanocobalamin 1000 MCG tablet Take 1 tablet (1,000 mcg total) by mouth daily. 10/15/20   10/17/20, MD  dicyclomine (BENTYL) 10 MG capsule Take 1 capsule (10 mg total) by mouth every 6 (six) hours as needed for up to 7 days  for spasms (abdominal pain). 11/14/20 11/21/20  Sharman Cheek, MD  escitalopram (LEXAPRO) 20 MG tablet Take 20 mg by mouth daily. 11/25/19   [provider]  folic acid (FOLVITE) 1 MG tablet Take 1 tablet (1 mg total) by mouth daily. 10/15/20 10/15/21  Arnetha Courser, MD  furosemide (LASIX) 40 MG tablet Take 1 tablet (40 mg total) by mouth daily as needed for fluid or edema. 10/15/20   Arnetha Courser, MD  gabapentin (NEURONTIN) 300 MG capsule Take 2 capsules (600 mg total) by mouth every 8 (eight) hours. 10/15/20   Arnetha Courser, MD  magnesium oxide (MAG-OX) 400 (241.3 Mg) MG tablet Take 1 tablet (400 mg total) by mouth 2 (two) times  daily. 10/15/20   Arnetha Courser, MD  melatonin 5 MG TABS Take 1 tablet (5 mg total) by mouth at bedtime. 10/15/20   Arnetha Courser, MD  Multiple Vitamin (MULTIVITAMIN WITH MINERALS) TABS tablet Take 1 tablet by mouth daily. 10/15/20   Arnetha Courser, MD  nicotine (NICODERM CQ - DOSED IN MG/24 HOURS) 21 mg/24hr patch Place 1 patch (21 mg total) onto the skin daily. 10/15/20   Arnetha Courser, MD  promethazine (PHENERGAN) 25 MG tablet Take 1 tablet (25 mg total) by mouth every 6 (six) hours as needed for nausea or vomiting. 11/14/20   Sharman Cheek, MD  thiamine 100 MG tablet Take 1 tablet (100 mg total) by mouth daily. 10/15/20   Arnetha Courser, MD    Allergies Penicillins and Zofran [ondansetron]  Family History  Problem Relation Age of Onset   Hypertension Other     Social History Social History   Tobacco Use   Smoking status: Every Day    Packs/day: 1.00    Types: Cigarettes   Smokeless tobacco: Never  Vaping Use   Vaping Use: Former  Substance Use Topics   Alcohol use: Yes    Comment: drinks 30oz wine a day   Drug use: Yes    Types: Marijuana    Review of Systems  Constitutional: Positive fever/chills Eyes: No visual changes. ENT: Positive sore throat. Respiratory: Positive cough Cardiovascular: Denies chest pain Gastrointestinal: Denies abdominal pain Genitourinary: Negative for dysuria. Musculoskeletal: Negative for back pain. Skin: Negative for rash. Neurological: Denies neurological changes    ____________________________________________   PHYSICAL EXAM:  VITAL SIGNS: ED Triage Vitals  Enc Vitals Group     BP 04/24/21 0719 (!) 145/108     Pulse Rate 04/24/21 0719 (!) 110     Resp 04/24/21 0719 19     Temp 04/24/21 0719 98.7 F (37.1 C)     Temp Source 04/24/21 0719 Oral     SpO2 04/24/21 0719 96 %     Weight 04/24/21 0712 (!) 350 lb 1.5 oz (158.8 kg)     Height 04/24/21 0712 5\' 10"  (1.778 m)     Head Circumference --      Peak Flow --      Pain Score  04/24/21 0712 0     Pain Loc --      Pain Edu? --      Excl. in GC? --     Constitutional: Alert and oriented. Well appearing and in no acute distress. Eyes: Conjunctivae are normal.  Head: Atraumatic. Nose: No congestion/rhinnorhea. Mouth/Throat: Mucous membranes are moist.  Throat appears normal Neck:  supple no lymphadenopathy noted Cardiovascular: Normal rate, regular rhythm. Heart sounds are normal Respiratory: Normal respiratory effort.  No retractions, lungs CTA GU: deferred Musculoskeletal: FROM all extremities, warm and well  perfused Neurologic:  Normal speech and language.  Skin:  Skin is warm, dry and intact. No rash noted. Psychiatric: Mood and affect are normal. Speech and behavior are normal.  ____________________________________________   LABS (all labs ordered are listed, but only abnormal results are displayed)  Labs Reviewed  RESP PANEL BY RT-PCR (FLU A&B, COVID) ARPGX2 - Abnormal; Notable for the following components:      Result Value   SARS Coronavirus 2 by RT PCR POSITIVE (*)    All other components within normal limits   ____________________________________________   ____________________________________________  RADIOLOGY  Chest X  ____________________________________________   PROCEDURES  Procedure(s) performed: No  Procedures    ____________________________________________   INITIAL IMPRESSION / ASSESSMENT AND PLAN / ED COURSE  Pertinent labs & imaging results that were available during my care of the patient were reviewed by me and considered in my medical decision making (see chart for details).   Patient is a 40 year old male who complains of URI symptoms.  Exam is consistent with covid.    Positive test for covid Chest x-ray reviewed by me confirmed by radiology to be negative  Patient is opting in for the covid medication.  Prescription was sent in to pharmacy. Paxlovid sent to pharmacy   The patient was instructed to  quarantine themselves at home.  Follow-up with your regular doctor if any concerns.  Return emergency department for worsening. OTC measures discussed     Nicholas Valdez was evaluated in Emergency Department on 04/24/2021 for the symptoms described in the history of present illness. He was evaluated in the context of the global COVID-19 pandemic, which necessitated consideration that the patient might be at risk for infection with the SARS-CoV-2 virus that causes COVID-19. Institutional protocols and algorithms that pertain to the evaluation of patients at risk for COVID-19 are in a state of rapid change based on information released by regulatory bodies including the CDC and federal and state organizations. These policies and algorithms were followed during the patient's care in the ED.   As part of my medical decision making, I reviewed the following data within the electronic MEDICAL RECORD NUMBER Nursing notes reviewed and incorporated, Labs reviewed , Old chart reviewed, Radiograph reviewed , Notes from prior ED visits, and [place] Controlled Substance Database  ____________________________________________   FINAL CLINICAL IMPRESSION(S) / ED DIAGNOSES  Final diagnoses:  COVID-19      NEW MEDICATIONS STARTED DURING THIS VISIT:  Discharge Medication List as of 04/24/2021  8:58 AM     START taking these medications   Details  benzonatate (TESSALON PERLES) 100 MG capsule Take 1 capsule (100 mg total) by mouth 3 (three) times daily as needed for cough., Starting Tue 04/24/2021, Until Wed 04/24/2022 at 2359, Normal    nirmatrelvir/ritonavir EUA (PAXLOVID) 20 x 150 MG & 10 x 100MG  TABS Take 3 tablets by mouth 2 (two) times daily for 5 days. Patient GFR is >60. Take nirmatrelvir (150 mg) two tablets twice daily for 5 days and ritonavir (100 mg) one tablet twice daily for 5 days., Starting Tue 04/24/2021, Until Sun 04/29/2021, Normal         Note:  This document was prepared using Dragon voice  recognition software and may include unintentional dictation errors.    05/01/2021, PA-C 04/24/21 1246    04/26/21, MD 04/25/21 [phone]

## 2021-04-24 NOTE — Discharge Instructions (Addendum)

## 2021-04-24 NOTE — ED Triage Notes (Signed)
C/O cough, chills x 1 day.  Tylenol yesterday.  Father tested positive for COVID.  AAOx3.  Skin warm and dry. NAD

## 2021-04-29 ENCOUNTER — Emergency Department: Payer: Medicaid Other

## 2021-04-29 ENCOUNTER — Encounter: Payer: Self-pay | Admitting: Emergency Medicine

## 2021-04-29 ENCOUNTER — Observation Stay
Admission: EM | Admit: 2021-04-29 | Discharge: 2021-04-30 | DRG: 871 | Discharge status: Against Medical Advice | Payer: Medicaid Other | Attending: Internal Medicine | Admitting: Internal Medicine

## 2021-04-29 ENCOUNTER — Other Ambulatory Visit: Payer: Self-pay

## 2021-04-29 DIAGNOSIS — F419 Anxiety disorder, unspecified: Secondary | ICD-10-CM | POA: Diagnosis present

## 2021-04-29 DIAGNOSIS — N179 Acute kidney failure, unspecified: Secondary | ICD-10-CM

## 2021-04-29 DIAGNOSIS — F32A Depression, unspecified: Secondary | ICD-10-CM | POA: Diagnosis not present

## 2021-04-29 DIAGNOSIS — Z6841 Body Mass Index (BMI) 40.0 and over, adult: Secondary | ICD-10-CM

## 2021-04-29 DIAGNOSIS — I252 Old myocardial infarction: Secondary | ICD-10-CM

## 2021-04-29 DIAGNOSIS — F1210 Cannabis abuse, uncomplicated: Secondary | ICD-10-CM | POA: Diagnosis not present

## 2021-04-29 DIAGNOSIS — Z79899 Other long term (current) drug therapy: Secondary | ICD-10-CM

## 2021-04-29 DIAGNOSIS — R778 Other specified abnormalities of plasma proteins: Secondary | ICD-10-CM | POA: Diagnosis present

## 2021-04-29 DIAGNOSIS — R079 Chest pain, unspecified: Secondary | ICD-10-CM

## 2021-04-29 DIAGNOSIS — R7303 Prediabetes: Secondary | ICD-10-CM | POA: Diagnosis not present

## 2021-04-29 DIAGNOSIS — A419 Sepsis, unspecified organism: Principal | ICD-10-CM | POA: Diagnosis present

## 2021-04-29 DIAGNOSIS — E876 Hypokalemia: Secondary | ICD-10-CM | POA: Diagnosis present

## 2021-04-29 DIAGNOSIS — R109 Unspecified abdominal pain: Secondary | ICD-10-CM

## 2021-04-29 DIAGNOSIS — Z8249 Family history of ischemic heart disease and other diseases of the circulatory system: Secondary | ICD-10-CM

## 2021-04-29 DIAGNOSIS — K219 Gastro-esophageal reflux disease without esophagitis: Secondary | ICD-10-CM | POA: Diagnosis present

## 2021-04-29 DIAGNOSIS — B1920 Unspecified viral hepatitis C without hepatic coma: Secondary | ICD-10-CM | POA: Diagnosis not present

## 2021-04-29 DIAGNOSIS — I214 Non-ST elevation (NSTEMI) myocardial infarction: Secondary | ICD-10-CM | POA: Insufficient documentation

## 2021-04-29 DIAGNOSIS — I10 Essential (primary) hypertension: Secondary | ICD-10-CM | POA: Diagnosis present

## 2021-04-29 DIAGNOSIS — R197 Diarrhea, unspecified: Secondary | ICD-10-CM | POA: Diagnosis not present

## 2021-04-29 DIAGNOSIS — U071 COVID-19: Secondary | ICD-10-CM | POA: Diagnosis present

## 2021-04-29 DIAGNOSIS — N10 Acute tubulo-interstitial nephritis: Secondary | ICD-10-CM | POA: Diagnosis present

## 2021-04-29 DIAGNOSIS — R7989 Other specified abnormal findings of blood chemistry: Secondary | ICD-10-CM

## 2021-04-29 DIAGNOSIS — F1010 Alcohol abuse, uncomplicated: Secondary | ICD-10-CM | POA: Diagnosis present

## 2021-04-29 DIAGNOSIS — G4733 Obstructive sleep apnea (adult) (pediatric): Secondary | ICD-10-CM | POA: Diagnosis present

## 2021-04-29 DIAGNOSIS — R10A2 Flank pain, left side: Secondary | ICD-10-CM

## 2021-04-29 DIAGNOSIS — I248 Other forms of acute ischemic heart disease: Secondary | ICD-10-CM | POA: Diagnosis not present

## 2021-04-29 DIAGNOSIS — F17210 Nicotine dependence, cigarettes, uncomplicated: Secondary | ICD-10-CM | POA: Diagnosis not present

## 2021-04-29 DIAGNOSIS — N390 Urinary tract infection, site not specified: Secondary | ICD-10-CM | POA: Diagnosis present

## 2021-04-29 DIAGNOSIS — Z720 Tobacco use: Secondary | ICD-10-CM | POA: Diagnosis present

## 2021-04-29 LAB — HEPATIC FUNCTION PANEL
ALT: 127 U/L — ABNORMAL HIGH (ref 0–44)
AST: 626 U/L — ABNORMAL HIGH (ref 15–41)
Albumin: 3.3 g/dL — ABNORMAL LOW (ref 3.5–5.0)
Alkaline Phosphatase: 50 U/L (ref 38–126)
Bilirubin, Direct: 0.2 mg/dL (ref 0.0–0.2)
Indirect Bilirubin: 0.6 mg/dL (ref 0.3–0.9)
Total Bilirubin: 0.8 mg/dL (ref 0.3–1.2)
Total Protein: 6.2 g/dL — ABNORMAL LOW (ref 6.5–8.1)

## 2021-04-29 LAB — URINALYSIS, COMPLETE (UACMP) WITH MICROSCOPIC
Bilirubin Urine: NEGATIVE
Glucose, UA: NEGATIVE mg/dL
Ketones, ur: NEGATIVE mg/dL
Leukocytes,Ua: NEGATIVE
Nitrite: NEGATIVE
Protein, ur: 100 mg/dL — AB
Specific Gravity, Urine: 1.009 (ref 1.005–1.030)
pH: 5 (ref 5.0–8.0)

## 2021-04-29 LAB — URINE DRUG SCREEN, QUALITATIVE (ARMC ONLY)
Amphetamines, Ur Screen: NOT DETECTED
Barbiturates, Ur Screen: NOT DETECTED
Benzodiazepine, Ur Scrn: POSITIVE — AB
Cannabinoid 50 Ng, Ur ~~LOC~~: POSITIVE — AB
Cocaine Metabolite,Ur ~~LOC~~: NOT DETECTED
MDMA (Ecstasy)Ur Screen: NOT DETECTED
Methadone Scn, Ur: NOT DETECTED
Opiate, Ur Screen: POSITIVE — AB
Phencyclidine (PCP) Ur S: NOT DETECTED
Tricyclic, Ur Screen: NOT DETECTED

## 2021-04-29 LAB — CBC
HCT: 44.3 % (ref 39.0–52.0)
Hemoglobin: 16.1 g/dL (ref 13.0–17.0)
MCH: 35.7 pg — ABNORMAL HIGH (ref 26.0–34.0)
MCHC: 36.3 g/dL — ABNORMAL HIGH (ref 30.0–36.0)
MCV: 98.2 fL (ref 80.0–100.0)
Platelets: 260 10*3/uL (ref 150–400)
RBC: 4.51 MIL/uL (ref 4.22–5.81)
RDW: 13.7 % (ref 11.5–15.5)
WBC: 12 10*3/uL — ABNORMAL HIGH (ref 4.0–10.5)
nRBC: 0 % (ref 0.0–0.2)

## 2021-04-29 LAB — BASIC METABOLIC PANEL
Anion gap: 13 (ref 5–15)
BUN: 11 mg/dL (ref 6–20)
CO2: 22 mmol/L (ref 22–32)
Calcium: 9.8 mg/dL (ref 8.9–10.3)
Chloride: 97 mmol/L — ABNORMAL LOW (ref 98–111)
Creatinine, Ser: 1.42 mg/dL — ABNORMAL HIGH (ref 0.61–1.24)
GFR, Estimated: >60 mL/min (ref >60)
Glucose, Bld: 152 mg/dL — ABNORMAL HIGH (ref 70–99)
Potassium: 3.4 mmol/L — ABNORMAL LOW (ref 3.5–5.1)
Sodium: 132 mmol/L — ABNORMAL LOW (ref 135–145)

## 2021-04-29 LAB — TROPONIN I (HIGH SENSITIVITY)
Troponin I (High Sensitivity): 80 ng/L — ABNORMAL HIGH (ref <18)
Troponin I (High Sensitivity): 67 ng/L — ABNORMAL HIGH (ref <18)
Troponin I (High Sensitivity): 32 ng/L — ABNORMAL HIGH (ref <18)

## 2021-04-29 LAB — PHOSPHORUS: Phosphorus: 4.5 mg/dL (ref 2.5–4.6)

## 2021-04-29 LAB — D-DIMER, QUANTITATIVE: D-Dimer, Quant: 0.48 ug/mL-FEU (ref 0.00–0.50)

## 2021-04-29 LAB — MAGNESIUM: Magnesium: 1.9 mg/dL (ref 1.7–2.4)

## 2021-04-29 MED ORDER — IPRATROPIUM BROMIDE HFA 17 MCG/ACT IN AERS
2.0000 | INHALATION_SPRAY | Freq: Four times a day (QID) | RESPIRATORY_TRACT | Status: DC
  Administered 2021-04-29 (×3): 2 via RESPIRATORY_TRACT
  Filled 2021-04-29: qty 12.9

## 2021-04-29 MED ORDER — FOLIC ACID 1 MG PO TABS
1.0000 mg | ORAL_TABLET | Freq: Every day | ORAL | Status: DC
  Administered 2021-04-29: 1 mg via ORAL
  Filled 2021-04-29: qty 1

## 2021-04-29 MED ORDER — LORAZEPAM 2 MG/ML IJ SOLN
0.0000 mg | Freq: Four times a day (QID) | INTRAMUSCULAR | Status: DC
  Filled 2021-04-29: qty 1

## 2021-04-29 MED ORDER — DM-GUAIFENESIN ER 30-600 MG PO TB12: 1.0000 | ORAL_TABLET | Freq: Two times a day (BID) | ORAL | Status: DC | PRN

## 2021-04-29 MED ORDER — ALBUTEROL SULFATE HFA 108 (90 BASE) MCG/ACT IN AERS
2.0000 | INHALATION_SPRAY | RESPIRATORY_TRACT | Status: DC | PRN
Start: 2021-04-29 — End: 2021-04-29
  Filled 2021-04-29: qty 6.7

## 2021-04-29 MED ORDER — ENOXAPARIN SODIUM 80 MG/0.8ML IJ SOSY
0.5000 mg/kg | PREFILLED_SYRINGE | INTRAMUSCULAR | Status: DC
  Administered 2021-04-29: 80 mg via SUBCUTANEOUS
  Filled 2021-04-29: qty 0.8

## 2021-04-29 MED ORDER — ALBUTEROL SULFATE (2.5 MG/3ML) 0.083% IN NEBU: 2.5000 mg | INHALATION_SOLUTION | RESPIRATORY_TRACT | Status: DC | PRN

## 2021-04-29 MED ORDER — LORAZEPAM 1 MG PO TABS
1.0000 mg | ORAL_TABLET | ORAL | Status: DC | PRN
Start: 2021-04-29 — End: 2021-04-30
  Administered 2021-04-29 (×3): 1 mg via ORAL
  Filled 2021-04-29 (×3): qty 1

## 2021-04-29 MED ORDER — LORAZEPAM 2 MG/ML IJ SOLN
0.0000 mg | Freq: Two times a day (BID) | INTRAMUSCULAR | Status: DC
Start: 2021-05-01 — End: 2021-04-30

## 2021-04-29 MED ORDER — HYDROMORPHONE HCL 1 MG/ML IJ SOLN
1.0000 mg | INTRAMUSCULAR | Status: AC
  Administered 2021-04-29: 1 mg via INTRAVENOUS
  Filled 2021-04-29: qty 1

## 2021-04-29 MED ORDER — NICOTINE 21 MG/24HR TD PT24
21.0000 mg | MEDICATED_PATCH | Freq: Every day | TRANSDERMAL | Status: DC
  Administered 2021-04-29: 21 mg via TRANSDERMAL
  Filled 2021-04-29: qty 1

## 2021-04-29 MED ORDER — SODIUM CHLORIDE 0.9 % IV SOLN
100.0000 mg | Freq: Every day | INTRAVENOUS | Status: DC
  Filled 2021-04-29: qty 20

## 2021-04-29 MED ORDER — LORAZEPAM 2 MG/ML IJ SOLN
1.0000 mg | INTRAMUSCULAR | Status: DC | PRN
  Administered 2021-04-29: 2 mg via INTRAVENOUS
  Administered 2021-04-29: 1 mg via INTRAVENOUS
  Filled 2021-04-29: qty 1

## 2021-04-29 MED ORDER — THIAMINE HCL 100 MG PO TABS
100.0000 mg | ORAL_TABLET | Freq: Every day | ORAL | Status: DC
  Administered 2021-04-29: 100 mg via ORAL
  Filled 2021-04-29: qty 1

## 2021-04-29 MED ORDER — NITROGLYCERIN 0.4 MG SL SUBL: 0.4000 mg | SUBLINGUAL_TABLET | SUBLINGUAL | Status: DC | PRN

## 2021-04-29 MED ORDER — PROMETHAZINE HCL 25 MG PO TABS: 12.5000 mg | ORAL_TABLET | Freq: Four times a day (QID) | ORAL | Status: DC | PRN

## 2021-04-29 MED ORDER — CHLORDIAZEPOXIDE HCL 5 MG PO CAPS
10.0000 mg | ORAL_CAPSULE | Freq: Three times a day (TID) | ORAL | Status: DC
  Administered 2021-04-29 (×3): 10 mg via ORAL
  Filled 2021-04-29 (×3): qty 2

## 2021-04-29 MED ORDER — SODIUM CHLORIDE 0.9 % IV SOLN
25.0000 mg | Freq: Four times a day (QID) | INTRAVENOUS | Status: DC | PRN
  Filled 2021-04-29 (×2): qty 1

## 2021-04-29 MED ORDER — CHLORDIAZEPOXIDE HCL 5 MG PO CAPS: 5.0000 mg | ORAL_CAPSULE | Freq: Three times a day (TID) | ORAL | Status: DC

## 2021-04-29 MED ORDER — MORPHINE SULFATE (PF) 4 MG/ML IV SOLN
4.0000 mg | Freq: Once | INTRAVENOUS | Status: AC
  Administered 2021-04-29: 4 mg via INTRAVENOUS
  Filled 2021-04-29: qty 1

## 2021-04-29 MED ORDER — ASPIRIN EC 81 MG PO TBEC: 81.0000 mg | DELAYED_RELEASE_TABLET | Freq: Every day | ORAL | Status: DC

## 2021-04-29 MED ORDER — HYDRALAZINE HCL 20 MG/ML IJ SOLN: 5.0000 mg | INTRAMUSCULAR | Status: DC | PRN

## 2021-04-29 MED ORDER — MORPHINE SULFATE (PF) 2 MG/ML IV SOLN
2.0000 mg | INTRAVENOUS | Status: DC | PRN
  Administered 2021-04-29 (×2): 2 mg via INTRAVENOUS
  Filled 2021-04-29 (×3): qty 1

## 2021-04-29 MED ORDER — THIAMINE HCL 100 MG/ML IJ SOLN
100.0000 mg | Freq: Every day | INTRAMUSCULAR | Status: DC
  Filled 2021-04-29: qty 2

## 2021-04-29 MED ORDER — IPRATROPIUM BROMIDE 0.02 % IN SOLN: 2.5000 mL | Freq: Four times a day (QID) | RESPIRATORY_TRACT | Status: DC

## 2021-04-29 MED ORDER — SODIUM CHLORIDE 0.9 % IV SOLN
2.0000 g | INTRAVENOUS | Status: DC
  Administered 2021-04-29: 2 g via INTRAVENOUS
  Filled 2021-04-29: qty 20

## 2021-04-29 MED ORDER — POTASSIUM CHLORIDE CRYS ER 20 MEQ PO TBCR
40.0000 meq | EXTENDED_RELEASE_TABLET | Freq: Once | ORAL | Status: AC
  Administered 2021-04-29: 40 meq via ORAL
  Filled 2021-04-29: qty 2

## 2021-04-29 MED ORDER — AZITHROMYCIN 250 MG PO TABS
250.0000 mg | ORAL_TABLET | Freq: Every day | ORAL | Status: DC
Start: 2021-04-30 — End: 2021-04-30

## 2021-04-29 MED ORDER — OXYCODONE HCL 5 MG PO TABS
10.0000 mg | ORAL_TABLET | Freq: Four times a day (QID) | ORAL | Status: DC | PRN
  Administered 2021-04-29 (×2): 10 mg via ORAL
  Filled 2021-04-29 (×2): qty 2

## 2021-04-29 MED ORDER — SODIUM CHLORIDE 0.9 % IV BOLUS
1000.0000 mL | Freq: Once | INTRAVENOUS | Status: AC
  Administered 2021-04-29: 1000 mL via INTRAVENOUS

## 2021-04-29 MED ORDER — AZITHROMYCIN 500 MG PO TABS
500.0000 mg | ORAL_TABLET | Freq: Every day | ORAL | Status: AC
  Administered 2021-04-29: 500 mg via ORAL
  Filled 2021-04-29: qty 1

## 2021-04-29 MED ORDER — ASPIRIN 81 MG PO CHEW
324.0000 mg | CHEWABLE_TABLET | Freq: Once | ORAL | Status: AC
  Administered 2021-04-29: 324 mg via ORAL
  Filled 2021-04-29: qty 4

## 2021-04-29 MED ORDER — ADULT MULTIVITAMIN W/MINERALS CH
1.0000 | ORAL_TABLET | Freq: Every day | ORAL | Status: DC
  Administered 2021-04-29: 1 via ORAL
  Filled 2021-04-29: qty 1

## 2021-04-29 MED ORDER — SODIUM CHLORIDE 0.9 % IV SOLN
200.0000 mg | Freq: Once | INTRAVENOUS | Status: AC
  Administered 2021-04-29: 200 mg via INTRAVENOUS
  Filled 2021-04-29: qty 200
  Filled 2021-04-29: qty 40

## 2021-04-29 MED ORDER — KETOROLAC TROMETHAMINE 30 MG/ML IJ SOLN
30.0000 mg | Freq: Once | INTRAMUSCULAR | Status: AC
  Administered 2021-04-29: 30 mg via INTRAVENOUS
  Filled 2021-04-29: qty 1

## 2021-04-29 NOTE — Consult Note (Signed)
Remdesivir - Pharmacy Brief Note   O:  ALT: n/a CXR: no cardiopulmonary disease seen SpO2: 96% on RA   A/P:  Remdesivir 200 mg IVPB once followed by 100 mg IVPB daily x 4 days.   Albina Billet, PharmD, BCPS Clinical Pharmacist 04/29/2021 8:52 AM

## 2021-04-29 NOTE — Consult Note (Signed)
PHARMACIST - PHYSICIAN COMMUNICATION  CONCERNING:  Enoxaparin (Lovenox) for DVT Prophylaxis    RECOMMENDATION: Patient was prescribed enoxaparin 40mg  q24 hours for VTE prophylaxis.   Filed Weights   04/29/21 0228  Weight: (!) 158.8 kg (350 lb 1.5 oz)    Body mass index is 50.23 kg/m.  Estimated Creatinine Clearance: 108.1 mL/min (A) (by C-G formula based on SCr of 1.42 mg/dL (H)).   Based on [facility] policy patient is candidate for enoxaparin 0.5mg /kg TBW SQ every 24 hours based on BMI being >30.  DESCRIPTION: Pharmacy has adjusted enoxaparin dose per [facility] policy.  Patient is now receiving enoxaparin 80 mg every 24 hours    [facility] 04/29/2021 8:06 AM

## 2021-04-29 NOTE — ED Provider Notes (Signed)
[facility] Emergency Department Provider Note   ____________________________________________   Event Date/Time   First MD Initiated Contact with Patient 04/29/21 [phone]     (approximate)  I have reviewed the triage vital signs and the nursing notes.   HISTORY  Chief Complaint Back Pain and Chest Pain    HPI Nicholas Valdez is a 40 y.o. male who presents to the ED from home with a chief complaint of chest pain and left flank pain.  Patient was diagnosed with COVID-19 4 days ago; patient is vaccinated.  Only took 2 days of Paxlovid but stopped due to side effects.  Reports chest tightness, nausea and right flank pain.  No history of kidney stones but has a strong family history of such.  Also reports urinary difficulty.  Chest pain associated with shortness of breath, nonradiating, not associated with diaphoresis, vomiting or dizziness.     Past Medical History:  Diagnosis Date   Alcohol abuse    Anxiety    Depression    Drug abuse (HCC)    GERD (gastroesophageal reflux disease)    Hepatitis C    Hypertension    Pneumonia    Pre-diabetes    Sleep apnea     Patient Active Problem List   Diagnosis Date Noted   NSTEMI (non-ST elevated myocardial infarction) (HCC) 04/29/2021   Dysthymia 10/13/2020   Alcoholic intoxication without complication (HCC)    Dysphagia    Hypoalbuminemia due to protein-calorie malnutrition (HCC)    Left foot pain    Peripheral edema    Impulsive    Acute encephalopathy    Hyperammonemia (HCC)    Morbid obesity (HCC)    Sleep disturbance    Acute on chronic anemia    Status post tracheostomy (HCC)    S/P percutaneous endoscopic gastrostomy (PEG) tube placement (HCC)    Pressure injury of skin 07/25/2020   Acute respiratory failure with hypoxia (HCC)    Elevated lactic acid level 06/20/2020   Alcohol withdrawal delirium (HCC) 06/19/2020   Increased ammonia level 06/19/2020   Acute metabolic encephalopathy  06/19/2020   Elevated LFTs 06/19/2020   Tobacco abuse 06/19/2020   Alcohol withdrawal (HCC) 12/15/2018    Past Surgical History:  Procedure Laterality Date   GASTROSTOMY W/ FEEDING TUBE Left 07/22/2020   TRACHEOSTOMY TUBE PLACEMENT N/A 07/10/2020   Procedure: TRACHEOSTOMY;  Surgeon: Bud Face, MD;  Location: [facility] ORS;  Service: ENT;  Laterality: N/A;    Prior to Admission medications   Medication Sig Start Date End Date Taking? Authorizing Provider  benzonatate (TESSALON PERLES) 100 MG capsule Take 1 capsule (100 mg total) by mouth 3 (three) times daily as needed for cough. 04/24/21 04/24/22  Fisher, Roselyn Bering, PA-C  calcium carbonate (OS-CAL - DOSED IN MG OF ELEMENTAL CALCIUM) 1250 (500 Ca) MG tablet Take 1 tablet (500 mg of elemental calcium total) by mouth 2 (two) times daily with a meal. 10/15/20   Arnetha Courser, MD  chlordiazePOXIDE (LIBRIUM) 25 MG capsule Take 1 capsule (25 mg total) by mouth 3 (three) times daily. For 1 day, then take twice a day for next day and then make it only at bedtime for next 2 days, you can use for next couple of days if needed. 10/15/20   Arnetha Courser, MD  cyanocobalamin 1000 MCG tablet Take 1 tablet (1,000 mcg total) by mouth daily. 10/15/20   Arnetha Courser, MD  dicyclomine (BENTYL) 10 MG capsule Take 1 capsule (10 mg total) by mouth every  6 (six) hours as needed for up to 7 days for spasms (abdominal pain). 11/14/20 11/21/20  Sharman Cheek, MD  escitalopram (LEXAPRO) 20 MG tablet Take 20 mg by mouth daily. 11/25/19   [provider]  folic acid (FOLVITE) 1 MG tablet Take 1 tablet (1 mg total) by mouth daily. 10/15/20 10/15/21  Arnetha Courser, MD  furosemide (LASIX) 40 MG tablet Take 1 tablet (40 mg total) by mouth daily as needed for fluid or edema. 10/15/20   Arnetha Courser, MD  gabapentin (NEURONTIN) 300 MG capsule Take 2 capsules (600 mg total) by mouth every 8 (eight) hours. 10/15/20   Arnetha Courser, MD  magnesium oxide (MAG-OX) 400 (241.3 Mg) MG  tablet Take 1 tablet (400 mg total) by mouth 2 (two) times daily. 10/15/20   Arnetha Courser, MD  melatonin 5 MG TABS Take 1 tablet (5 mg total) by mouth at bedtime. 10/15/20   Arnetha Courser, MD  Multiple Vitamin (MULTIVITAMIN WITH MINERALS) TABS tablet Take 1 tablet by mouth daily. 10/15/20   Arnetha Courser, MD  nicotine (NICODERM CQ - DOSED IN MG/24 HOURS) 21 mg/24hr patch Place 1 patch (21 mg total) onto the skin daily. 10/15/20   Arnetha Courser, MD  nirmatrelvir/ritonavir EUA (PAXLOVID) 20 x 150 MG & 10 x 100MG  TABS Take 3 tablets by mouth 2 (two) times daily for 5 days. Patient GFR is >60. Take nirmatrelvir (150 mg) two tablets twice daily for 5 days and ritonavir (100 mg) one tablet twice daily for 5 days. 04/24/21 04/29/21  Fisher, 05/01/21, PA-C  promethazine (PHENERGAN) 25 MG tablet Take 1 tablet (25 mg total) by mouth every 6 (six) hours as needed for nausea or vomiting. 11/14/20   01/14/21, MD  thiamine 100 MG tablet Take 1 tablet (100 mg total) by mouth daily. 10/15/20   10/17/20, MD    Allergies Penicillins and Zofran [ondansetron]  Family History  Problem Relation Age of Onset   Hypertension Other     Social History Social History   Tobacco Use   Smoking status: Every Day    Packs/day: 1.00    Types: Cigarettes   Smokeless tobacco: Never  Vaping Use   Vaping Use: Former  Substance Use Topics   Alcohol use: Yes    Comment: drinks 30oz wine a day   Drug use: Yes    Types: Marijuana    Review of Systems  Constitutional: No fever/chills Eyes: No visual changes. ENT: No sore throat. Cardiovascular: Positive for chest pain. Respiratory: Positive for shortness of breath. Gastrointestinal: No abdominal pain.  No nausea, no vomiting.  No diarrhea.  No constipation. Genitourinary: Negative for dysuria. Musculoskeletal: Negative for back pain. Skin: Negative for rash. Neurological: Negative for headaches, focal weakness or  numbness.   ____________________________________________   PHYSICAL EXAM:  VITAL SIGNS: ED Triage Vitals  Enc Vitals Group     BP 04/29/21 0223 (!) 143/88     Pulse Rate 04/29/21 0223 (!) 104     Resp 04/29/21 0223 (!) 21     Temp 04/29/21 0223 98 F (36.7 C)     Temp Source 04/29/21 0223 Oral     SpO2 04/29/21 0223 96 %     Weight 04/29/21 0228 (!) 350 lb 1.5 oz (158.8 kg)     Height 04/29/21 0228 5\' 10"  (1.778 m)     Head Circumference --      Peak Flow --      Pain Score 04/29/21 0228 10  Pain Loc --      Pain Edu? --      Excl. in GC? --     Constitutional: Alert and oriented. Well appearing and in mild acute distress. Eyes: Conjunctivae are normal. PERRL. EOMI. Head: Atraumatic. Nose: No congestion/rhinnorhea. Mouth/Throat: Mucous membranes are moist.   Neck: No stridor.   Cardiovascular: Normal rate, regular rhythm. Grossly normal heart sounds.  Good peripheral circulation. Respiratory: Normal respiratory effort.  No retractions. Lungs CTAB. Gastrointestinal: Soft and nontender to light or deep palpation. No distention. No abdominal bruits.  Mild left CVA tenderness. Musculoskeletal: No lower extremity tenderness nor edema.  No joint effusions. Neurologic:  Normal speech and language. No gross focal neurologic deficits are appreciated. No gait instability. Skin:  Skin is warm, dry and intact. No rash noted. Psychiatric: Mood and affect are normal. Speech and behavior are normal.  ____________________________________________   LABS (all labs ordered are listed, but only abnormal results are displayed)  Labs Reviewed  BASIC METABOLIC PANEL - Abnormal; Notable for the following components:      Result Value   Sodium 132 (*)    Potassium 3.4 (*)    Chloride 97 (*)    Glucose, Bld 152 (*)    Creatinine, Ser 1.42 (*)    All other components within normal limits  CBC - Abnormal; Notable for the following components:   WBC 12.0 (*)    MCH 35.7 (*)    MCHC  36.3 (*)    All other components within normal limits  TROPONIN I (HIGH SENSITIVITY) - Abnormal; Notable for the following components:   Troponin I (High Sensitivity) 80 (*)    All other components within normal limits  TROPONIN I (HIGH SENSITIVITY) - Abnormal; Notable for the following components:   Troponin I (High Sensitivity) 67 (*)    All other components within normal limits  D-DIMER, QUANTITATIVE  URINALYSIS, COMPLETE (UACMP) WITH MICROSCOPIC   ____________________________________________  EKG  ED ECG REPORT I, Katlynne Mckercher J, the attending physician, personally viewed and interpreted this ECG.   Date: 04/29/2021  EKG Time: 0228  Rate: 97  Rhythm: normal sinus rhythm  Axis: LAD  Intervals:none  ST&T Change: Nonspecific  ____________________________________________  RADIOLOGY Tawni Millers J, personally viewed and evaluated these images (plain radiographs) as part of my medical decision making, as well as reviewing the written report by the radiologist.  ED MD interpretation: No acute cardiopulmonary process; unremarkable CT renal stone study  Official radiology report(s): DG Chest 2 View  Result Date: 04/29/2021 CLINICAL DATA:  Chest pain, dyspnea EXAM: CHEST - 2 VIEW COMPARISON:  04/24/2021 FINDINGS: The heart size and mediastinal contours are within normal limits. Both lungs are clear. The visualized skeletal structures are unremarkable. IMPRESSION: No active cardiopulmonary disease. Electronically Signed   By: Helyn Numbers M.D.   On: 04/29/2021 03:01   CT Renal Stone Study  Result Date: 04/29/2021 CLINICAL DATA:  Flank pain. EXAM: CT ABDOMEN AND PELVIS WITHOUT CONTRAST TECHNIQUE: Multidetector CT imaging of the abdomen and pelvis was performed following the standard protocol without IV contrast. COMPARISON:  07/20/2020 FINDINGS: Lower chest: No acute abnormality. Hepatobiliary: Hepatic steatosis. Pancreas: Unremarkable. No pancreatic ductal dilatation or surrounding  inflammatory changes. Spleen: Normal in size without focal abnormality. Adrenals/Urinary Tract: Adrenal glands are unremarkable. Kidneys are normal, without renal calculi, focal lesion, or hydronephrosis. Bladder is unremarkable. Stomach/Bowel: Stomach is within normal limits. Appendix appears normal. No evidence of bowel wall thickening, distention, or inflammatory changes. Vascular/Lymphatic: No significant vascular findings are present.  No enlarged abdominal or pelvic lymph nodes. Reproductive: Prostate is unremarkable. Other: No free fluid or fluid collections Musculoskeletal: No acute or significant osseous findings. IMPRESSION: 1. No acute findings within the abdomen or pelvis. 2. Hepatic steatosis. Electronically Signed   By: Signa Kell M.D.   On: 04/29/2021 05:37    ____________________________________________   PROCEDURES  Procedure(s) performed (including Critical Care):  .1-3 Lead EKG Interpretation Performed by: Irean Hong, MD Authorized by: Irean Hong, MD     Interpretation: normal     ECG rate:  95   ECG rate assessment: normal     Rhythm: sinus rhythm     Ectopy: none     Conduction: normal   Comments:     Patient placed on cardiac monitor to evaluate for arrhythmias   ____________________________________________   INITIAL IMPRESSION / ASSESSMENT AND PLAN / ED COURSE  As part of my medical decision making, I reviewed the following data within the electronic MEDICAL RECORD NUMBER Nursing notes reviewed and incorporated, Labs reviewed, EKG interpreted, Old chart reviewed, Radiograph reviewed, and Notes from prior ED visits     40 year old male with COVID presenting with chest pain, shortness of breath and left flank pain. Differential diagnosis includes, but is not limited to, ACS, aortic dissection, pulmonary embolism, cardiac tamponade, pneumothorax, pneumonia, pericarditis, myocarditis, GI-related causes including esophagitis/gastritis, and musculoskeletal chest wall  pain.     Initial troponin 80.  D-dimer negative.  Will obtain CT renal colic study for left flank pain, administer analgesia and antiemetic.  Anticipate hospitalization for elevated troponin.  Clinical Course as of 04/29/21 5027  Wynelle Link Apr 29, 2021  0530 CT unremarkable.  Will discuss with hospitalist services for admission. [JS]    Clinical Course User Index [JS] Irean Hong, MD     ____________________________________________   FINAL CLINICAL IMPRESSION(S) / ED DIAGNOSES  Final diagnoses:  Chest pain, unspecified type  Elevated troponin  AKI (acute kidney injury) (HCC)  Left flank pain     ED Discharge Orders     None        Note:  This document was prepared using Dragon voice recognition software and may include unintentional dictation errors.    Irean Hong, MD 04/29/21 [phone]

## 2021-04-29 NOTE — H&P (Addendum)
History and Physical    Nicholas Valdez DOB: 1981-01-30 DOA: 04/29/2021  Referring MD/NP/PA:   PCP: Oswaldo Conroy, MD   Patient coming from:  The patient is coming from home.  At baseline, pt is independent for most of ADL.        Chief Complaint: chest pain, SOB, left flank pain and dysuria  HPI: Nicholas Valdez is a 40 y.o. male with medical history significant of hypertension, prediabetes, GERD, depression with anxiety, OSA not on CPAP, HCV, alcohol abuse, tobacco abuse, obesity, marijuana abuse, alcohol withdrawal seizure, previous PEG tube placement and tracheostomy, who presents with chest pain, shortness breath, left flank pain and dysuria.  Patient had positive COVID test on 04/24/2021.  Patient was started on Paxlovid.  Patient states that he took this medication for 2 days, but he developed jittery and feels more anxious, could not tolerate his medication and then stopped taking it.  He continues to have shortness breath, dry cough, no fever or chills.  Patient reports chest pain, which is located in the left side of chest, dull, mild to moderate, nonradiating. He also reports dysuria and burning on urination.  He has left flank pain and left lower back pain, which is constant, moderate to severe, sharp, radiating to left groin area.  Patient has nausea, no vomiting or abdominal pain.  He has diarrhea, 3-4 times of watery diarrhea each day.   ED Course: pt was found to have WBC 12.0, troponin level 80, 67, negative D-dimer 0.48, positive urinalysis (cloudy appearance, negative leukocyte, few bacteria, WBC 21-50), creatinine 1.42, BUN 11 (GFR> 60), potassium 3.4, pending LFT.  temperature normal, blood pressure 164/87, heart rate 104, RR 22, oxygen saturation 93-95% on room air.  Chest x-ray negative.  CT per renal stone protocol is negative for acute issues.  Patient is admitted to progressive bed as inpatient.  Review of Systems:   General: no fevers,  chills, no body weight gain, has poor appetite, has fatigue HEENT: no blurry vision, hearing changes or sore throat Respiratory: has dyspnea, coughing, no wheezing CV: has chest pain, no palpitations GI:  has nausea, no vomiting, abdominal pain, has diarrhea,  no constipation GU: has dysuria, burning on urination, no  increased urinary frequency, hematuria  Ext: no leg edema Neuro: no unilateral weakness, numbness, or tingling, no vision change or hearing loss Skin: no rash, no skin tear. MSK: No muscle spasm, no deformity, no limitation of range of movement in spin Heme: No easy bruising.  Travel history: No recent long distant travel.  Allergy:  Allergies  Allergen Reactions   Penicillins Hives    Did it involve swelling of the face/tongue/throat, SOB, or low BP? Yes Did it involve sudden or severe rash/hives, skin peeling, or any reaction on the inside of your mouth or nose? Yes Did you need to seek medical attention at a hospital or doctor's office? Yes When did it last happen?      childhood If all above answers are "NO", may proceed with cephalosporin use.  Patient tolerated Ancef administration well   Zofran [Ondansetron] Hives    Past Medical History:  Diagnosis Date   Alcohol abuse    Anxiety    Depression    Drug abuse (HCC)    GERD (gastroesophageal reflux disease)    Hepatitis C    Hypertension    Pneumonia    Pre-diabetes    Sleep apnea     Past Surgical History:  Procedure Laterality Date  GASTROSTOMY W/ FEEDING TUBE Left 07/22/2020   TRACHEOSTOMY TUBE PLACEMENT N/A 07/10/2020   Procedure: TRACHEOSTOMY;  Surgeon: Bud Face, MD;  Location: [facility] ORS;  Service: ENT;  Laterality: N/A;    Social History:  reports that he has been smoking cigarettes. He has been smoking an average of 1 pack per day. He has never used smokeless tobacco. He reports current alcohol use. He reports current drug use. Drug: Marijuana.  Family History:  Family History   Problem Relation Age of Onset   Hypertension Other      Prior to Admission medications   Medication Sig Start Date End Date Taking? Authorizing Provider  benzonatate (TESSALON PERLES) 100 MG capsule Take 1 capsule (100 mg total) by mouth 3 (three) times daily as needed for cough. 04/24/21 04/24/22  Fisher, Roselyn Bering, PA-C  calcium carbonate (OS-CAL - DOSED IN MG OF ELEMENTAL CALCIUM) 1250 (500 Ca) MG tablet Take 1 tablet (500 mg of elemental calcium total) by mouth 2 (two) times daily with a meal. 10/15/20   Arnetha Courser, MD  chlordiazePOXIDE (LIBRIUM) 25 MG capsule Take 1 capsule (25 mg total) by mouth 3 (three) times daily. For 1 day, then take twice a day for next day and then make it only at bedtime for next 2 days, you can use for next couple of days if needed. 10/15/20   Arnetha Courser, MD  cyanocobalamin 1000 MCG tablet Take 1 tablet (1,000 mcg total) by mouth daily. 10/15/20   Arnetha Courser, MD  dicyclomine (BENTYL) 10 MG capsule Take 1 capsule (10 mg total) by mouth every 6 (six) hours as needed for up to 7 days for spasms (abdominal pain). 11/14/20 11/21/20  Sharman Cheek, MD  escitalopram (LEXAPRO) 20 MG tablet Take 20 mg by mouth daily. 11/25/19   [provider]  folic acid (FOLVITE) 1 MG tablet Take 1 tablet (1 mg total) by mouth daily. 10/15/20 10/15/21  Arnetha Courser, MD  furosemide (LASIX) 40 MG tablet Take 1 tablet (40 mg total) by mouth daily as needed for fluid or edema. 10/15/20   Arnetha Courser, MD  gabapentin (NEURONTIN) 300 MG capsule Take 2 capsules (600 mg total) by mouth every 8 (eight) hours. 10/15/20   Arnetha Courser, MD  magnesium oxide (MAG-OX) 400 (241.3 Mg) MG tablet Take 1 tablet (400 mg total) by mouth 2 (two) times daily. 10/15/20   Arnetha Courser, MD  melatonin 5 MG TABS Take 1 tablet (5 mg total) by mouth at bedtime. 10/15/20   Arnetha Courser, MD  Multiple Vitamin (MULTIVITAMIN WITH MINERALS) TABS tablet Take 1 tablet by mouth daily. 10/15/20   Arnetha Courser, MD   nicotine (NICODERM CQ - DOSED IN MG/24 HOURS) 21 mg/24hr patch Place 1 patch (21 mg total) onto the skin daily. 10/15/20   Arnetha Courser, MD  nirmatrelvir/ritonavir EUA (PAXLOVID) 20 x 150 MG & 10 x 100MG  TABS Take 3 tablets by mouth 2 (two) times daily for 5 days. Patient GFR is >60. Take nirmatrelvir (150 mg) two tablets twice daily for 5 days and ritonavir (100 mg) one tablet twice daily for 5 days. 04/24/21 04/29/21  Fisher, Roselyn Bering, PA-C  promethazine (PHENERGAN) 25 MG tablet Take 1 tablet (25 mg total) by mouth every 6 (six) hours as needed for nausea or vomiting. 11/14/20   Sharman Cheek, MD  thiamine 100 MG tablet Take 1 tablet (100 mg total) by mouth daily. 10/15/20   Arnetha Courser, MD    Physical Exam: Vitals:   04/29/21 6144 04/29/21 0430  04/29/21 0518 04/29/21 0700  BP: (!) 164/111 (!) 152/88 (!) 164/87 (!) 161/99  Pulse:  85 91 78  Resp:   (!) 22   Temp:   98.1 F (36.7 C)   TempSrc:   Oral   SpO2:  93% 95% 96%  Weight:      Height:       General: Not in acute distress HEENT:       Eyes: PERRL, EOMI, no scleral icterus.       ENT: No discharge from the ears and nose, no pharynx injection, no tonsillar enlargement.        Neck: No JVD, no bruit, no mass felt. Heme: No neck lymph node enlargement. Cardiac: S1/S2, RRR, No murmurs, No gallops or rubs. Respiratory: No rales, wheezing, rhonchi or rubs. GI: Soft, nondistended, nontender, no rebound pain, no organomegaly, BS present. GU: positive left CVA tenderness Ext: No pitting leg edema bilaterally. 1+DP/PT pulse bilaterally. Musculoskeletal: No joint deformities, No joint redness or warmth, no limitation of ROM in spin. Skin: No rashes.  Neuro: Alert, oriented X3, cranial nerves II-XII grossly intact, moves all extremities normally.  Psych: Patient is not psychotic, no suicidal or hemocidal ideation.  Labs on Admission: I have personally reviewed following labs and imaging studies  CBC: Recent Labs  Lab  04/29/21 0231  WBC 12.0*  HGB 16.1  HCT 44.3  MCV 98.2  PLT 260   Basic Metabolic Panel: Recent Labs  Lab 04/29/21 0231  NA 132*  K 3.4*  CL 97*  CO2 22  GLUCOSE 152*  BUN 11  CREATININE 1.42*  CALCIUM 9.8   GFR: Estimated Creatinine Clearance: 108.1 mL/min (A) (by C-G formula based on SCr of 1.42 mg/dL (H)). Liver Function Tests: No results for input(s): AST, ALT, ALKPHOS, BILITOT, PROT, ALBUMIN in the last 168 hours. No results for input(s): LIPASE, AMYLASE in the last 168 hours. No results for input(s): AMMONIA in the last 168 hours. Coagulation Profile: No results for input(s): INR, PROTIME in the last 168 hours. Cardiac Enzymes: No results for input(s): CKTOTAL, CKMB, CKMBINDEX, TROPONINI in the last 168 hours. BNP (last 3 results) No results for input(s): PROBNP in the last 8760 hours. HbA1C: No results for input(s): HGBA1C in the last 72 hours. CBG: No results for input(s): GLUCAP in the last 168 hours. Lipid Profile: No results for input(s): CHOL, HDL, LDLCALC, TRIG, CHOLHDL, LDLDIRECT in the last 72 hours. Thyroid Function Tests: No results for input(s): TSH, T4TOTAL, FREET4, T3FREE, THYROIDAB in the last 72 hours. Anemia Panel: No results for input(s): VITAMINB12, FOLATE, FERRITIN, TIBC, IRON, RETICCTPCT in the last 72 hours. Urine analysis:    Component Value Date/Time   COLORURINE AMBER (A) 04/29/2021 0716   APPEARANCEUR CLOUDY (A) 04/29/2021 0716   APPEARANCEUR Hazy 02/12/2013 2028   LABSPEC 1.009 04/29/2021 0716   LABSPEC 1.019 02/12/2013 2028   PHURINE 5.0 04/29/2021 0716   GLUCOSEU NEGATIVE 04/29/2021 0716   GLUCOSEU Negative 02/12/2013 2028   HGBUR LARGE (A) 04/29/2021 0716   BILIRUBINUR NEGATIVE 04/29/2021 0716   BILIRUBINUR Negative 02/12/2013 2028   KETONESUR NEGATIVE 04/29/2021 0716   PROTEINUR 100 (A) 04/29/2021 0716   NITRITE NEGATIVE 04/29/2021 0716   LEUKOCYTESUR NEGATIVE 04/29/2021 0716   LEUKOCYTESUR Negative 02/12/2013 2028    Sepsis Labs: @LABRCNTIP (procalcitonin:4,lacticidven:4) ) Recent Results (from the past 240 hour(s))  Resp Panel by RT-PCR (Flu A&B, Covid) Nasopharyngeal Swab     Status: Abnormal   Collection Time: 04/24/21  8:08 AM   Specimen: Nasopharyngeal  Swab; Nasopharyngeal(NP) swabs in vial transport medium  Result Value Ref Range Status   SARS Coronavirus 2 by RT PCR POSITIVE (A) NEGATIVE Final    Comment: RESULT CALLED TO, READ BACK BY AND VERIFIED WITH:  Tonia Ghent ON [id number] @0915  MU (NOTE) SARS-CoV-2 target nucleic acids are DETECTED.  The SARS-CoV-2 RNA is generally detectable in upper respiratory specimens during the acute phase of infection. Positive results are indicative of the presence of the identified virus, but do not rule out bacterial infection or co-infection with other pathogens not detected by the test. Clinical correlation with patient history and other diagnostic information is necessary to determine patient infection status. The expected result is Negative.  Fact Sheet for Patients:  Fact Sheet for Healthcare Providers: BloggerCourse.com  This test is not yet approved or cleared by the [employer] FDA and  has been authorized for detection and/or diagnosis of SARS-CoV-2 by FDA under an Emergency Use Authorization (EUA).  This EUA will remain in effect (meaning this test can be use d) for the duration of  the COVID-19 declaration under Section 564(b)(1) of the Act, 21 U.S.C. section 360bbb-3(b)(1), unless the authorization is terminated or revoked sooner.     Influenza A by PCR NEGATIVE NEGATIVE Final   Influenza B by PCR NEGATIVE NEGATIVE Final    Comment: (NOTE) The Xpert Xpress SARS-CoV-2/FLU/RSV plus assay is intended as an aid in the diagnosis of influenza from Nasopharyngeal swab specimens and should not be used as a sole basis for treatment. Nasal washings and aspirates are unacceptable  for Xpert Xpress SARS-CoV-2/FLU/RSV testing.  Fact Sheet for Patients: [place]  Fact Sheet for Healthcare Providers: BloggerCourse.com  This test is not yet approved or cleared by the [employer] FDA and has been authorized for detection and/or diagnosis of SARS-CoV-2 by FDA under an Emergency Use Authorization (EUA). This EUA will remain in effect (meaning this test can be used) for the duration of the COVID-19 declaration under Section 564(b)(1) of the Act, 21 U.S.C. section 360bbb-3(b)(1), unless the authorization is terminated or revoked.  Performed at [facility], [address] [place], [place]      Radiological Exams on Admission: DG Chest 2 View  Result Date: 04/29/2021 CLINICAL DATA:  Chest pain, dyspnea EXAM: CHEST - 2 VIEW COMPARISON:  04/24/2021 FINDINGS: The heart size and mediastinal contours are within normal limits. Both lungs are clear. The visualized skeletal structures are unremarkable. IMPRESSION: No active cardiopulmonary disease. Electronically Signed   By: 04/26/2021 M.D.   On: 04/29/2021 03:01   CT Renal Stone Study  Result Date: 04/29/2021 CLINICAL DATA:  Flank pain. EXAM: CT ABDOMEN AND PELVIS WITHOUT CONTRAST TECHNIQUE: Multidetector CT imaging of the abdomen and pelvis was performed following the standard protocol without IV contrast. COMPARISON:  07/20/2020 FINDINGS: Lower chest: No acute abnormality. Hepatobiliary: Hepatic steatosis. Pancreas: Unremarkable. No pancreatic ductal dilatation or surrounding inflammatory changes. Spleen: Normal in size without focal abnormality. Adrenals/Urinary Tract: Adrenal glands are unremarkable. Kidneys are normal, without renal calculi, focal lesion, or hydronephrosis. Bladder is unremarkable. Stomach/Bowel: Stomach is within normal limits. Appendix appears normal. No evidence of bowel wall thickening, distention, or inflammatory changes.  Vascular/Lymphatic: No significant vascular findings are present. No enlarged abdominal or pelvic lymph nodes. Reproductive: Prostate is unremarkable. Other: No free fluid or fluid collections Musculoskeletal: No acute or significant osseous findings. IMPRESSION: 1. No acute findings within the abdomen or pelvis. 2. Hepatic steatosis. Electronically Signed   By: 07/22/2020.D.  On: 04/29/2021 05:37     EKG: I have personally reviewed.  Sinus rhythm, QTC 436, LAD, nonspecific T wave change  Assessment/Plan Principal Problem:   Chest pain Active Problems:   Tobacco abuse   COVID-19 virus infection   Alcohol abuse   Elevated troponin   Hypokalemia   HTN (hypertension)   Sepsis (HCC)   UTI (urinary tract infection)   Diarrhea   Acute pyelonephritis  Chest pain and elevated troponin: Troponin level 80 --> 67, possibly due to demand ischemia.  D-dimer negative, less likely to have acute PE.  - admit to progressive unit as inpatient - Trend Trop - Repeat EKG in the am  - prn Nitroglycerin, Morphine, and aspirin - Risk factor stratification: will check FLP and A1C  - check UDS - 2d echo  COVID-19 virus infection: Could not tolerate Paxlovid.  Patient does not have oxygen desaturation, chest x-ray negative, but patient has morbid obesity, is high risk patient.  He still has shortness breath and cough. Patient has leukocytosis 12.0, which may be partially due to UTI/pyelonephritis, but cannot completely rule out superimposed bacterial infection -Will start on remdesivir -Start Z-Pak -As needed Mucinex -Bronchodilators  Sepsis due to UTI and possible acute pyelonephritis: Patient meets criteria for sepsis with leukocytosis with WBC 12.0, tachycardia with heart rate 104, RR 22.  Pending lactic acid level -IV Rocephin -Follow-up of blood culture and urine culture -will get Procalcitonin and trend lactic acid levels per sepsis protocol. -IVF: 1L of NS bolus in ED. If lactic acid level  is elevated, will give more IV fluid  Tobacco abuse and Alcohol abuse: -Nicotine patch -CIWA protocol -librium 10 mg tid  Hypokalemia: Potassium 3.4 -Repleted potassium with 40 mEq of oral potassium chloride -Check magnesium and phosphorus level  HTN (hypertension): -IV hydralazine as needed -hold lasix due to sepsis  Diarrhea: Possibly due to COVID 19 infection, since patient has sepsis, we will check C. Difficile -Follow-up C. difficile test -If C. difficile test negative, will start Imodium  Depression with anxiety -Continue home medications    DVT ppx: SQ Lovenox Code Status: Full code Family Communication: not done, no family member is at bed side.     Disposition Plan:  Anticipate discharge back to previous environment Consults called:  none Admission status and Level of care: Progressive Cardiac:  as inpt      Status is: Inpatient  Remains inpatient appropriate because:Inpatient level of care appropriate due to severity of illness  Dispo: The patient is from: Home              Anticipated d/c is to: Home              Patient currently is not medically stable to d/c.   Difficult to place patient No          Date of Service 04/29/2021    Lorretta Harp [facility]   If 7PM-7AM, please contact night-coverage [email or web address] 04/29/2021, 9:13 AM

## 2021-04-29 NOTE — ED Notes (Signed)
Pt refuses to keep leads on. Pt educated and MD aware

## 2021-04-29 NOTE — ED Triage Notes (Signed)
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 States upper back pain that radiates to his butt. Pt also c/o substernal CP and SOB that started earlier this evening and awoke him from his sleep. Pt A&O x4. Pt states increased pain with movement, states unable to get comfortable.

## 2021-04-29 NOTE — ED Notes (Signed)
Patient aware that we need urine sample for testing, unable at this time. Pt given instruction on providing urine sample when able to do so.   

## 2021-04-30 DIAGNOSIS — R197 Diarrhea, unspecified: Secondary | ICD-10-CM | POA: Diagnosis not present

## 2021-04-30 DIAGNOSIS — R079 Chest pain, unspecified: Secondary | ICD-10-CM | POA: Diagnosis not present

## 2021-04-30 DIAGNOSIS — N10 Acute tubulo-interstitial nephritis: Secondary | ICD-10-CM

## 2021-04-30 DIAGNOSIS — F1010 Alcohol abuse, uncomplicated: Secondary | ICD-10-CM | POA: Diagnosis not present

## 2021-04-30 DIAGNOSIS — N3000 Acute cystitis without hematuria: Secondary | ICD-10-CM

## 2021-04-30 LAB — URINE CULTURE: Culture: NO GROWTH

## 2021-04-30 LAB — HEMOGLOBIN A1C
Hgb A1c MFr Bld: 5.4 % (ref 4.8–5.6)
Mean Plasma Glucose: 108 mg/dL

## 2021-04-30 NOTE — ED Notes (Signed)
Pt educated on keeping leads on.

## 2021-04-30 NOTE — ED Notes (Signed)
Pt stated he is leaving and pulled IV out. MD aware pt still did not want to talk with provider. Pt continued to ask for pain meds. Pt was informed of abnormal labs, but still wanted to leave. He stated he will come back for medication if needed.

## 2021-04-30 NOTE — Discharge Summary (Addendum)
Physician Discharge Summary  Nicholas Valdez BEM:[id number] DOB: 22-Apr-1981 DOA: 04/29/2021  PCP: Oswaldo Conroy, MD  Admit date: 04/29/2021 Discharge date: 04/30/2021  Recommendations for Outpatient Follow-up:  -none since pt left on AMA  Home Health: none Equipment/Devices: none  Discharge Condition: CODE STATUS: full code Diet recommendation: should be on heart healthy diet  Brief/Interim Summary (HPI) Nicholas Valdez is a 40 y.o. male with medical history significant of hypertension, prediabetes, GERD, depression with anxiety, OSA not on CPAP, HCV, alcohol abuse, tobacco abuse, obesity, marijuana abuse, alcohol withdrawal seizure, previous PEG tube placement and tracheostomy, who presents with chest pain, shortness breath, left flank pain and dysuria.   Patient had positive COVID test on 04/24/2021.  Patient was started on Paxlovid.  Patient states that he took this medication for 2 days, but he developed jittery and feels more anxious, could not tolerate his medication and then stopped taking it.  He continues to have shortness breath, dry cough, no fever or chills.  Patient reports chest pain, which is located in the left side of chest, dull, mild to moderate, nonradiating. He also reports dysuria and burning on urination.  He has left flank pain and left lower back pain, which is constant, moderate to severe, sharp, radiating to left groin area.  Patient has nausea, no vomiting or abdominal pain.  He has diarrhea, 3-4 times of watery diarrhea each day.     ED Course: pt was found to have WBC 12.0, troponin level 80, 67, negative D-dimer 0.48, positive urinalysis (cloudy appearance, negative leukocyte, few bacteria, WBC 21-50), creatinine 1.42, BUN 11 (GFR> 60), potassium 3.4, pending LFT.  temperature normal, blood pressure 164/87, heart rate 104, RR 22, oxygen saturation 93-95% on room air.  Chest x-ray negative.  CT per renal stone protocol is negative for acute issues.  Patient  is admitted to progressive bed as inpatient.   Subjective  -chest pain, SOB, left flank pain and dysuria  Discharge Diagnoses and Hospital Course:   Principal Problem:   Chest pain Active Problems:   Tobacco abuse   COVID-19 virus infection   Alcohol abuse   Elevated troponin   Hypokalemia   HTN (hypertension)   Sepsis (HCC)   UTI (urinary tract infection)   Diarrhea   Acute pyelonephritis  Chest pain and elevated troponin: Troponin level 80 --> 67 --> 32, trending down, possibly due to demand ischemia.  D-dimer negative, less likely to have acute PE.  Plan was made as below, unfortunately patient left hospital on AMA in the night.   - admited to progressive unit as inpatient - Trended Trop - prn Nitroglycerin, Morphine, and aspirin in hospital - Risk factor stratification: checked FLP and A1C, not done - 2d echo is ordered, but was not done   COVID-19 virus infection: Could not tolerate Paxlovid.  Patient does not have oxygen desaturation, chest x-ray negative, but patient has morbid obesity, is high risk patient.  He still has shortness breath and cough. Patient has leukocytosis 12.0, which may be partially due to UTI/pyelonephritis, but cannot completely rule out superimposed bacterial infection.   -started on remdesivir in hospital -Started Z-Pak -As needed Mucinex -Bronchodilators   Sepsis due to UTI and possible acute pyelonephritis: Patient meeted criteria for sepsis with leukocytosis with WBC 12.0, tachycardia with heart rate 104, RR 22.  Pending lactic acid level was not done -started IV Rocephin -ordered blood culture and urine culture -Procalcitonin and trend lactic acid levels per sepsis protocol. -IVF: 1L of NS  bolus in ED. Plan was: If lactic acid level is elevated, will give more IV fluid   Tobacco abuse and Alcohol abuse: -Nicotine patch -CIWA protocol -librium 10 mg tid   Hypokalemia: Potassium 3.4 -Repleted potassium with 40 mEq of oral potassium  chloride -Checked magnesium and phosphorus level --> Mg 1.9 and phosphorus 4.5   HTN (hypertension): -IV hydralazine as needed in hosopital -held lasix due to sepsis in hospital   Diarrhea: Possibly due to COVID 19 infection, since patient has sepsis, checke C. Difficile -Follow-up C. difficile test -If C. difficile test negative, planed to start Imodium   Depression with anxiety -Continued home medications        Discharge Instructions  none  Allergies as of 04/30/2021       Reactions   Penicillins Hives   Did it involve swelling of the face/tongue/throat, SOB, or low BP? Yes Did it involve sudden or severe rash/hives, skin peeling, or any reaction on the inside of your mouth or nose? Yes Did you need to seek medical attention at a hospital or doctor's office? Yes When did it last happen?      childhood If all above answers are "NO", may proceed with cephalosporin use. Patient tolerated Ancef administration well   Zofran [ondansetron] Hives        Medication List     ASK your doctor about these medications    benzonatate 100 MG capsule Commonly known as: Tessalon Perles Take 1 capsule (100 mg total) by mouth 3 (three) times daily as needed for cough.   calcium carbonate 1250 (500 Ca) MG tablet Commonly known as: OS-CAL - dosed in mg of elemental calcium Take 1 tablet (500 mg of elemental calcium total) by mouth 2 (two) times daily with a meal.   chlordiazePOXIDE 25 MG capsule Commonly known as: LIBRIUM Take 1 capsule (25 mg total) by mouth 3 (three) times daily. For 1 day, then take twice a day for next day and then make it only at bedtime for next 2 days, you can use for next couple of days if needed.   cyanocobalamin 1000 MCG tablet Take 1 tablet (1,000 mcg total) by mouth daily.   dicyclomine 10 MG capsule Commonly known as: Bentyl Take 1 capsule (10 mg total) by mouth every 6 (six) hours as needed for up to 7 days for spasms (abdominal pain).    escitalopram 20 MG tablet Commonly known as: LEXAPRO Take 20 mg by mouth daily.   folic acid 1 MG tablet Commonly known as: FOLVITE Take 1 tablet (1 mg total) by mouth daily.   furosemide 40 MG tablet Commonly known as: LASIX Take 1 tablet (40 mg total) by mouth daily as needed for fluid or edema.   gabapentin 300 MG capsule Commonly known as: NEURONTIN Take 2 capsules (600 mg total) by mouth every 8 (eight) hours.   magnesium oxide 400 (241.3 Mg) MG tablet Commonly known as: MAG-OX Take 1 tablet (400 mg total) by mouth 2 (two) times daily.   melatonin 5 MG Tabs Take 1 tablet (5 mg total) by mouth at bedtime.   multivitamin with minerals Tabs tablet Take 1 tablet by mouth daily.   nicotine 21 mg/24hr patch Commonly known as: NICODERM CQ - dosed in mg/24 hours Place 1 patch (21 mg total) onto the skin daily.   nirmatrelvir/ritonavir EUA 20 x 150 MG & 10 x 100MG  Tabs Commonly known as: PAXLOVID Take 3 tablets by mouth 2 (two) times daily for 5 days. Patient  GFR is >60. Take nirmatrelvir (150 mg) two tablets twice daily for 5 days and ritonavir (100 mg) one tablet twice daily for 5 days. Ask about: Should I take this medication?   promethazine 25 MG tablet Commonly known as: PHENERGAN Take 1 tablet (25 mg total) by mouth every 6 (six) hours as needed for nausea or vomiting.   thiamine 100 MG tablet Take 1 tablet (100 mg total) by mouth daily.        Allergies  Allergen Reactions   Penicillins Hives    Did it involve swelling of the face/tongue/throat, SOB, or low BP? Yes Did it involve sudden or severe rash/hives, skin peeling, or any reaction on the inside of your mouth or nose? Yes Did you need to seek medical attention at a hospital or doctor's office? Yes When did it last happen?      childhood If all above answers are "NO", may proceed with cephalosporin use.  Patient tolerated Ancef administration well   Zofran [Ondansetron] Hives     Consultations:    Procedures/Studies: DG Chest 2 View  Result Date: 04/29/2021 CLINICAL DATA:  Chest pain, dyspnea EXAM: CHEST - 2 VIEW COMPARISON:  04/24/2021 FINDINGS: The heart size and mediastinal contours are within normal limits. Both lungs are clear. The visualized skeletal structures are unremarkable. IMPRESSION: No active cardiopulmonary disease. Electronically Signed   By: Helyn Numbers M.D.   On: 04/29/2021 03:01   DG Chest Port 1 View  Result Date: 04/24/2021 CLINICAL DATA:  40 year old male with cough and chills. Father recently positive for COVID-19. EXAM: PORTABLE CHEST 1 VIEW COMPARISON:  Chest radiographs 02/24/2021 and earlier. FINDINGS: Portable AP upright view at 0813 hours. Lung volumes and mediastinal contours remain normal. Visualized tracheal air column is within normal limits. Allowing for portable technique the lungs are clear. No pneumothorax or pleural effusion. No osseous abnormality identified. IMPRESSION: Negative portable chest. Electronically Signed   By: Odessa Fleming M.D.   On: 04/24/2021 08:43   CT Renal Stone Study  Result Date: 04/29/2021 CLINICAL DATA:  Flank pain. EXAM: CT ABDOMEN AND PELVIS WITHOUT CONTRAST TECHNIQUE: Multidetector CT imaging of the abdomen and pelvis was performed following the standard protocol without IV contrast. COMPARISON:  07/20/2020 FINDINGS: Lower chest: No acute abnormality. Hepatobiliary: Hepatic steatosis. Pancreas: Unremarkable. No pancreatic ductal dilatation or surrounding inflammatory changes. Spleen: Normal in size without focal abnormality. Adrenals/Urinary Tract: Adrenal glands are unremarkable. Kidneys are normal, without renal calculi, focal lesion, or hydronephrosis. Bladder is unremarkable. Stomach/Bowel: Stomach is within normal limits. Appendix appears normal. No evidence of bowel wall thickening, distention, or inflammatory changes. Vascular/Lymphatic: No significant vascular findings are present. No enlarged  abdominal or pelvic lymph nodes. Reproductive: Prostate is unremarkable. Other: No free fluid or fluid collections Musculoskeletal: No acute or significant osseous findings. IMPRESSION: 1. No acute findings within the abdomen or pelvis. 2. Hepatic steatosis. Electronically Signed   By: Signa Kell M.D.   On: 04/29/2021 05:37      Discharge Exam: Vitals:   04/29/21 1830 04/29/21 2357  BP: (!) 147/89 (!) 144/117  Pulse: 89 (!) 107  Resp: 16 20  Temp:    SpO2: 94% 95%   Vitals:   04/29/21 1731 04/29/21 1800 04/29/21 1830 04/29/21 2357  BP: (!) 126/98 (!) 130/102 (!) 147/89 (!) 144/117  Pulse: (!) 106 86 89 (!) 107  Resp:   16 20  Temp:      TempSrc:      SpO2:   94% 95%  Weight:  Height:       PE was not performed since pt left hospital in the night when I was not in hospital     The results of significant diagnostics from this hospitalization (including imaging, microbiology, ancillary and laboratory) are listed below for reference.     Microbiology: Recent Results (from the past 240 hour(s))  Resp Panel by RT-PCR (Flu A&B, Covid) Nasopharyngeal Swab     Status: Abnormal   Collection Time: 04/24/21  8:08 AM   Specimen: Nasopharyngeal Swab; Nasopharyngeal(NP) swabs in vial transport medium  Result Value Ref Range Status   SARS Coronavirus 2 by RT PCR POSITIVE (A) NEGATIVE Final    Comment: RESULT CALLED TO, READ BACK BY AND VERIFIED WITH:  Tonia Ghent ON [id number] @0915  MU (NOTE) SARS-CoV-2 target nucleic acids are DETECTED.  The SARS-CoV-2 RNA is generally detectable in upper respiratory specimens during the acute phase of infection. Positive results are indicative of the presence of the identified virus, but do not rule out bacterial infection or co-infection with other pathogens not detected by the test. Clinical correlation with patient history and other diagnostic information is necessary to determine patient infection status. The expected result is  Negative.  Fact Sheet for Patients:  Fact Sheet for Healthcare Providers: BloggerCourse.com  This test is not yet approved or cleared by the [place] FDA and  has been authorized for detection and/or diagnosis of SARS-CoV-2 by FDA under an Emergency Use Authorization (EUA).  This EUA will remain in effect (meaning this test can be use d) for the duration of  the COVID-19 declaration under Section 564(b)(1) of the Act, 21 U.S.C. section 360bbb-3(b)(1), unless the authorization is terminated or revoked sooner.     Influenza A by PCR NEGATIVE NEGATIVE Final   Influenza B by PCR NEGATIVE NEGATIVE Final    Comment: (NOTE) The Xpert Xpress SARS-CoV-2/FLU/RSV plus assay is intended as an aid in the diagnosis of influenza from Nasopharyngeal swab specimens and should not be used as a sole basis for treatment. Nasal washings and aspirates are unacceptable for Xpert Xpress SARS-CoV-2/FLU/RSV testing.  Fact Sheet for Patients: [place]  Fact Sheet for Healthcare Providers: BloggerCourse.com  This test is not yet approved or cleared by the [place] FDA and has been authorized for detection and/or diagnosis of SARS-CoV-2 by FDA under an Emergency Use Authorization (EUA). This EUA will remain in effect (meaning this test can be used) for the duration of the COVID-19 declaration under Section 564(b)(1) of the Act, 21 U.S.C. section 360bbb-3(b)(1), unless the authorization is terminated or revoked.  Performed at [facility], [address] [place], [place]      Labs: BNP (last 3 results) Recent Labs    06/25/20 0047  BNP 494.9*   Basic Metabolic Panel: Recent Labs  Lab 04/29/21 0231 04/29/21 1230  NA 132*  --   K 3.4*  --   CL 97*  --   CO2 22  --   GLUCOSE 152*  --   BUN 11  --   CREATININE 1.42*  --   CALCIUM 9.8   --   MG  --  1.9  PHOS  --  4.5   Liver Function Tests: Recent Labs  Lab 04/29/21 1230  AST 626*  ALT 127*  ALKPHOS 50  BILITOT 0.8  PROT 6.2*  ALBUMIN 3.3*   No results for input(s): LIPASE, AMYLASE in the last 168 hours. No results for input(s): AMMONIA in the last 168 hours. CBC:  Recent Labs  Lab 04/29/21 0231  WBC 12.0*  HGB 16.1  HCT 44.3  MCV 98.2  PLT 260   Cardiac Enzymes: No results for input(s): CKTOTAL, CKMB, CKMBINDEX, TROPONINI in the last 168 hours. BNP: Invalid input(s): POCBNP CBG: No results for input(s): GLUCAP in the last 168 hours. D-Dimer Recent Labs    04/29/21 0231  DDIMER 0.48   Hgb A1c No results for input(s): HGBA1C in the last 72 hours. Lipid Profile No results for input(s): CHOL, HDL, LDLCALC, TRIG, CHOLHDL, LDLDIRECT in the last 72 hours. Thyroid function studies No results for input(s): TSH, T4TOTAL, T3FREE, THYROIDAB in the last 72 hours.  Invalid input(s): FREET3 Anemia work up No results for input(s): VITAMINB12, FOLATE, FERRITIN, TIBC, IRON, RETICCTPCT in the last 72 hours. Urinalysis    Component Value Date/Time   COLORURINE AMBER (A) 04/29/2021 0716   APPEARANCEUR CLOUDY (A) 04/29/2021 0716   APPEARANCEUR Hazy 02/12/2013 2028   LABSPEC 1.009 04/29/2021 0716   LABSPEC 1.019 02/12/2013 2028   PHURINE 5.0 04/29/2021 0716   GLUCOSEU NEGATIVE 04/29/2021 0716   GLUCOSEU Negative 02/12/2013 2028   HGBUR LARGE (A) 04/29/2021 0716   BILIRUBINUR NEGATIVE 04/29/2021 0716   BILIRUBINUR Negative 02/12/2013 2028   KETONESUR NEGATIVE 04/29/2021 0716   PROTEINUR 100 (A) 04/29/2021 0716   NITRITE NEGATIVE 04/29/2021 0716   LEUKOCYTESUR NEGATIVE 04/29/2021 0716   LEUKOCYTESUR Negative 02/12/2013 2028   Sepsis Labs Invalid input(s): PROCALCITONIN,  WBC,  LACTICIDVEN Microbiology Recent Results (from the past 240 hour(s))  Resp Panel by RT-PCR (Flu A&B, Covid) Nasopharyngeal Swab     Status: Abnormal   Collection Time: 04/24/21   8:08 AM   Specimen: Nasopharyngeal Swab; Nasopharyngeal(NP) swabs in vial transport medium  Result Value Ref Range Status   SARS Coronavirus 2 by RT PCR POSITIVE (A) NEGATIVE Final    Comment: RESULT CALLED TO, READ BACK BY AND VERIFIED WITH:  Tonia Ghent ON [id number] @0915  MU (NOTE) SARS-CoV-2 target nucleic acids are DETECTED.  The SARS-CoV-2 RNA is generally detectable in upper respiratory specimens during the acute phase of infection. Positive results are indicative of the presence of the identified virus, but do not rule out bacterial infection or co-infection with other pathogens not detected by the test. Clinical correlation with patient history and other diagnostic information is necessary to determine patient infection status. The expected result is Negative.  Fact Sheet for Patients: BloggerCourse.com  Fact Sheet for Healthcare Providers: [employer]  This test is not yet approved or cleared by the [place] FDA and  has been authorized for detection and/or diagnosis of SARS-CoV-2 by FDA under an Emergency Use Authorization (EUA).  This EUA will remain in effect (meaning this test can be use d) for the duration of  the COVID-19 declaration under Section 564(b)(1) of the Act, 21 U.S.C. section 360bbb-3(b)(1), unless the authorization is terminated or revoked sooner.     Influenza A by PCR NEGATIVE NEGATIVE Final   Influenza B by PCR NEGATIVE NEGATIVE Final    Comment: (NOTE) The Xpert Xpress SARS-CoV-2/FLU/RSV plus assay is intended as an aid in the diagnosis of influenza from Nasopharyngeal swab specimens and should not be used as a sole basis for treatment. Nasal washings and aspirates are unacceptable for Xpert Xpress SARS-CoV-2/FLU/RSV testing.  Fact Sheet for Patients: BloggerCourse.com  Fact Sheet for Healthcare Providers: [employer]ious[employer].it  This test is  not yet approved or cleared by the [place] FDA and has been authorized for detection and/or diagnosis of SARS-CoV-2 by FDA under  an Emergency Use Authorization (EUA). This EUA will remain in effect (meaning this test can be used) for the duration of the COVID-19 declaration under Section 564(b)(1) of the Act, 21 U.S.C. section 360bbb-3(b)(1), unless the authorization is terminated or revoked.  Performed at [facility], [address] [place], [place] [address]     Time coordinating discharge:  20 minutes.   SIGNED:  Lorretta Harp, MD [facility] 04/30/2021, 7:22 AM   If 7PM-7AM, please contact night-coverage [email or web address]

## 2021-05-01 ENCOUNTER — Other Ambulatory Visit: Payer: Self-pay

## 2021-05-01 ENCOUNTER — Emergency Department
Admission: EM | Admit: 2021-05-01 | Discharge: 2021-05-01 | Disposition: A | Discharge status: Home / Self Care | Payer: Medicaid Other | Attending: Emergency Medicine | Admitting: Emergency Medicine

## 2021-05-01 DIAGNOSIS — Z5321 Procedure and treatment not carried out due to patient leaving prior to being seen by health care provider: Secondary | ICD-10-CM | POA: Diagnosis not present

## 2021-05-01 DIAGNOSIS — M549 Dorsalgia, unspecified: Secondary | ICD-10-CM | POA: Diagnosis not present

## 2021-05-01 DIAGNOSIS — M79605 Pain in left leg: Secondary | ICD-10-CM | POA: Insufficient documentation

## 2021-05-01 NOTE — ED Triage Notes (Signed)
Pt to triage via w/c with no distress noted; pt reports returns for persistent back pain radiating down left leg; st rx pain meds without relief; also wants detox for ETOH (last drink 6hrs PTA); seen yesterday at [facility] ED and dx muscle strain

## 2021-05-03 ENCOUNTER — Other Ambulatory Visit: Payer: Self-pay

## 2021-05-03 ENCOUNTER — Emergency Department
Admission: EM | Admit: 2021-05-03 | Discharge: 2021-05-03 | Disposition: A | Discharge status: Home / Self Care | Payer: Medicaid Other | Attending: Emergency Medicine | Admitting: Emergency Medicine

## 2021-05-03 ENCOUNTER — Emergency Department: Payer: Medicaid Other

## 2021-05-03 DIAGNOSIS — I10 Essential (primary) hypertension: Secondary | ICD-10-CM | POA: Insufficient documentation

## 2021-05-03 DIAGNOSIS — U071 COVID-19: Secondary | ICD-10-CM | POA: Insufficient documentation

## 2021-05-03 DIAGNOSIS — M545 Low back pain, unspecified: Secondary | ICD-10-CM | POA: Insufficient documentation

## 2021-05-03 DIAGNOSIS — Z8616 Personal history of COVID-19: Secondary | ICD-10-CM | POA: Insufficient documentation

## 2021-05-03 DIAGNOSIS — F17210 Nicotine dependence, cigarettes, uncomplicated: Secondary | ICD-10-CM | POA: Diagnosis not present

## 2021-05-03 DIAGNOSIS — Z79899 Other long term (current) drug therapy: Secondary | ICD-10-CM | POA: Diagnosis not present

## 2021-05-03 DIAGNOSIS — R42 Dizziness and giddiness: Secondary | ICD-10-CM

## 2021-05-03 DIAGNOSIS — M5442 Lumbago with sciatica, left side: Secondary | ICD-10-CM

## 2021-05-03 LAB — TROPONIN I (HIGH SENSITIVITY): Troponin I (High Sensitivity): 9 ng/L (ref <18)

## 2021-05-03 LAB — COMPREHENSIVE METABOLIC PANEL
ALT: 168 U/L — ABNORMAL HIGH (ref 0–44)
AST: 371 U/L — ABNORMAL HIGH (ref 15–41)
Albumin: 3.5 g/dL (ref 3.5–5.0)
Alkaline Phosphatase: 61 U/L (ref 38–126)
Anion gap: 14 (ref 5–15)
BUN: <5 mg/dL — ABNORMAL LOW (ref 6–20)
CO2: 26 mmol/L (ref 22–32)
Calcium: 9.3 mg/dL (ref 8.9–10.3)
Chloride: 97 mmol/L — ABNORMAL LOW (ref 98–111)
Creatinine, Ser: 0.8 mg/dL (ref 0.61–1.24)
GFR, Estimated: >60 mL/min (ref >60)
Glucose, Bld: 119 mg/dL — ABNORMAL HIGH (ref 70–99)
Potassium: 3.6 mmol/L (ref 3.5–5.1)
Sodium: 137 mmol/L (ref 135–145)
Total Bilirubin: 0.7 mg/dL (ref 0.3–1.2)
Total Protein: 7.3 g/dL (ref 6.5–8.1)

## 2021-05-03 LAB — CBC WITH DIFFERENTIAL/PLATELET
Abs Immature Granulocytes: 0.05 10*3/uL (ref 0.00–0.07)
Basophils Absolute: 0 10*3/uL (ref 0.0–0.1)
Basophils Relative: 0 %
Eosinophils Absolute: 0.2 10*3/uL (ref 0.0–0.5)
Eosinophils Relative: 2 %
HCT: 42 % (ref 39.0–52.0)
Hemoglobin: 15.5 g/dL (ref 13.0–17.0)
Immature Granulocytes: 1 %
Lymphocytes Relative: 24 %
Lymphs Abs: 2.3 10*3/uL (ref 0.7–4.0)
MCH: 36.1 pg — ABNORMAL HIGH (ref 26.0–34.0)
MCHC: 36.9 g/dL — ABNORMAL HIGH (ref 30.0–36.0)
MCV: 97.9 fL (ref 80.0–100.0)
Monocytes Absolute: 1.1 10*3/uL — ABNORMAL HIGH (ref 0.1–1.0)
Monocytes Relative: 12 %
Neutro Abs: 5.8 10*3/uL (ref 1.7–7.7)
Neutrophils Relative %: 61 %
Platelets: 344 10*3/uL (ref 150–400)
RBC: 4.29 MIL/uL (ref 4.22–5.81)
RDW: 14 % (ref 11.5–15.5)
WBC: 9.4 10*3/uL (ref 4.0–10.5)
nRBC: 0 % (ref 0.0–0.2)

## 2021-05-03 LAB — TSH: TSH: 3.56 u[IU]/mL (ref 0.350–4.500)

## 2021-05-03 LAB — T4, FREE: Free T4: 0.84 ng/dL (ref 0.61–1.12)

## 2021-05-03 LAB — MAGNESIUM: Magnesium: 1.5 mg/dL — ABNORMAL LOW (ref 1.7–2.4)

## 2021-05-03 MED ORDER — PREDNISONE 10 MG PO TABS: 10.0000 mg | ORAL_TABLET | Freq: Every day | ORAL | 0 refills | Status: AC

## 2021-05-03 MED ORDER — PREDNISONE 20 MG PO TABS
60.0000 mg | ORAL_TABLET | Freq: Once | ORAL | Status: AC
  Administered 2021-05-03: 60 mg via ORAL
  Filled 2021-05-03: qty 3

## 2021-05-03 MED ORDER — PREDNISONE 10 MG PO TABS: 10.0000 mg | ORAL_TABLET | Freq: Every day | ORAL | 0 refills | Status: DC

## 2021-05-03 NOTE — ED Provider Notes (Signed)
[facility] Emergency Department Provider Note  Time seen: 6:07 PM  I have reviewed the triage vital signs and the nursing notes.   HISTORY  Chief Complaint Dizziness and Shortness of Breath   HPI Nicholas Valdez is a 40 y.o. male with medical history of alcohol abuse, depression, remote history of substance abuse, hepatitis C, hypertension, presents to the emergency department for dizziness.  According to the patient approximately 10 days ago he was diagnosed with COVID.  Continues to have a slight cough but states he has been feeling intermittently dizzy and off balance.  Denies any vomiting does state chronic diarrhea which she relates to the alcohol use.  Denies any recent fevers.  Patient also states is a secondary complaint over the past several weeks he has been experiencing lower back pain that has begun to radiate down the left leg at times.  Describes his back pain as moderate dull pain, worse with movement.   Past Medical History:  Diagnosis Date   Alcohol abuse    Anxiety    Depression    Drug abuse (HCC)    GERD (gastroesophageal reflux disease)    Hepatitis C    Hypertension    Pneumonia    Pre-diabetes    Sleep apnea     Patient Active Problem List   Diagnosis Date Noted   NSTEMI (non-ST elevated myocardial infarction) (HCC) 04/29/2021   COVID-19 virus infection 04/29/2021   Alcohol abuse 04/29/2021   Chest pain 04/29/2021   Elevated troponin 04/29/2021   Hypokalemia 04/29/2021   HTN (hypertension) 04/29/2021   Sepsis (HCC) 04/29/2021   UTI (urinary tract infection) 04/29/2021   Diarrhea 04/29/2021   Acute pyelonephritis 04/29/2021   Dysthymia 10/13/2020   Alcoholic intoxication without complication (HCC)    Dysphagia    Hypoalbuminemia due to protein-calorie malnutrition (HCC)    Left foot pain    Peripheral edema    Impulsive    Acute encephalopathy    Hyperammonemia (HCC)    Morbid obesity (HCC)    Sleep disturbance     Acute on chronic anemia    Status post tracheostomy (HCC)    S/P percutaneous endoscopic gastrostomy (PEG) tube placement (HCC)    Pressure injury of skin 07/25/2020   Acute respiratory failure with hypoxia (HCC)    Elevated lactic acid level 06/20/2020   Alcohol withdrawal delirium (HCC) 06/19/2020   Increased ammonia level 06/19/2020   Acute metabolic encephalopathy 06/19/2020   Elevated LFTs 06/19/2020   Tobacco abuse 06/19/2020   Alcohol withdrawal (HCC) 12/15/2018    Past Surgical History:  Procedure Laterality Date   GASTROSTOMY W/ FEEDING TUBE Left 07/22/2020   TRACHEOSTOMY TUBE PLACEMENT N/A 07/10/2020   Procedure: TRACHEOSTOMY;  Surgeon: Bud Face, MD;  Location: [facility] ORS;  Service: ENT;  Laterality: N/A;    Prior to Admission medications   Medication Sig Start Date End Date Taking? Authorizing Provider  benzonatate (TESSALON PERLES) 100 MG capsule Take 1 capsule (100 mg total) by mouth 3 (three) times daily as needed for cough. 04/24/21 04/24/22  Fisher, Roselyn Bering, PA-C  calcium carbonate (OS-CAL - DOSED IN MG OF ELEMENTAL CALCIUM) 1250 (500 Ca) MG tablet Take 1 tablet (500 mg of elemental calcium total) by mouth 2 (two) times daily with a meal. Patient not taking: No sig reported 10/15/20   Arnetha Courser, MD  chlordiazePOXIDE (LIBRIUM) 25 MG capsule Take 1 capsule (25 mg total) by mouth 3 (three) times daily. For 1 day, then take twice a  day for next day and then make it only at bedtime for next 2 days, you can use for next couple of days if needed. Patient not taking: No sig reported 10/15/20   Arnetha Courser, MD  cyanocobalamin 1000 MCG tablet Take 1 tablet (1,000 mcg total) by mouth daily. 10/15/20   Arnetha Courser, MD  dicyclomine (BENTYL) 10 MG capsule Take 1 capsule (10 mg total) by mouth every 6 (six) hours as needed for up to 7 days for spasms (abdominal pain). 11/14/20 11/21/20  Sharman Cheek, MD  escitalopram (LEXAPRO) 20 MG tablet Take 20 mg by mouth daily. 11/25/19    [provider]  folic acid (FOLVITE) 1 MG tablet Take 1 tablet (1 mg total) by mouth daily. 10/15/20 10/15/21  Arnetha Courser, MD  furosemide (LASIX) 40 MG tablet Take 1 tablet (40 mg total) by mouth daily as needed for fluid or edema. 10/15/20   Arnetha Courser, MD  gabapentin (NEURONTIN) 300 MG capsule Take 2 capsules (600 mg total) by mouth every 8 (eight) hours. Patient taking differently: Take 300 mg by mouth every 8 (eight) hours. 10/15/20   Arnetha Courser, MD  magnesium oxide (MAG-OX) 400 (241.3 Mg) MG tablet Take 1 tablet (400 mg total) by mouth 2 (two) times daily. 10/15/20   Arnetha Courser, MD  melatonin 5 MG TABS Take 1 tablet (5 mg total) by mouth at bedtime. 10/15/20   Arnetha Courser, MD  Multiple Vitamin (MULTIVITAMIN WITH MINERALS) TABS tablet Take 1 tablet by mouth daily. 10/15/20   Arnetha Courser, MD  nicotine (NICODERM CQ - DOSED IN MG/24 HOURS) 21 mg/24hr patch Place 1 patch (21 mg total) onto the skin daily. Patient not taking: No sig reported 10/15/20   Arnetha Courser, MD  promethazine (PHENERGAN) 25 MG tablet Take 1 tablet (25 mg total) by mouth every 6 (six) hours as needed for nausea or vomiting. Patient not taking: No sig reported 11/14/20   Sharman Cheek, MD  thiamine 100 MG tablet Take 1 tablet (100 mg total) by mouth daily. Patient not taking: No sig reported 10/15/20   Arnetha Courser, MD    Allergies  Allergen Reactions   Penicillins Hives    Did it involve swelling of the face/tongue/throat, SOB, or low BP? Yes Did it involve sudden or severe rash/hives, skin peeling, or any reaction on the inside of your mouth or nose? Yes Did you need to seek medical attention at a hospital or doctor's office? Yes When did it last happen?      childhood If all above answers are "NO", may proceed with cephalosporin use.  Patient tolerated Ancef administration well   Zofran [Ondansetron] Hives    Family History  Problem Relation Age of Onset   Hypertension Other     Social  History Social History   Tobacco Use   Smoking status: Every Day    Packs/day: 1.00    Types: Cigarettes   Smokeless tobacco: Never  Vaping Use   Vaping Use: Former  Substance Use Topics   Alcohol use: Yes    Comment: drinks 30oz wine a day   Drug use: Yes    Types: Marijuana    Review of Systems Constitutional: Negative for fever. Cardiovascular: Negative for chest pain. Respiratory: States slight shortness of breath at times. Gastrointestinal: Negative for abdominal pain Musculoskeletal: Moderate lower back pain with occasional pain shooting down the left leg. Skin: Negative for skin complaints  Neurological: Negative for headache All other ROS negative  ____________________________________________   PHYSICAL EXAM:  VITAL SIGNS: ED Triage Vitals  Enc Vitals Group     BP 05/03/21 1653 137/90     Pulse Rate 05/03/21 1653 (!) 108     Resp 05/03/21 1653 (!) 22     Temp 05/03/21 1653 98.8 F (37.1 C)     Temp Source 05/03/21 1653 Oral     SpO2 05/03/21 1653 95 %     Weight 05/03/21 1652 (!) 350 lb (158.8 kg)     Height 05/03/21 1652 6' (1.829 m)     Head Circumference --      Peak Flow --      Pain Score 05/03/21 1703 10     Pain Loc --      Pain Edu? --      Excl. in GC? --    Constitutional: Alert and oriented. Well appearing and in no distress. Eyes: Normal exam ENT      Head: Normocephalic and atraumatic.      Mouth/Throat: Mucous membranes are moist. Cardiovascular: Normal rate, regular rhythm.  Respiratory: Normal respiratory effort without tachypnea nor retractions. Breath sounds are clear  Gastrointestinal: Soft and nontender. No distention.   Musculoskeletal: Nontender with normal range of motion in all extremities.  Neurologic:  Normal speech and language. No gross focal neurologic deficits  Skin:  Skin is warm, dry and intact.  Psychiatric: Mood and affect are normal.   ____________________________________________  RADIOLOGY  IMPRESSION:   No active cardiopulmonary disease.   ____________________________________________   INITIAL IMPRESSION / ASSESSMENT AND PLAN / ED COURSE  Pertinent labs & imaging results that were available during my care of the patient were reviewed by me and considered in my medical decision making (see chart for details).   Patient presents emergency department for intermittent dizziness as well as low back pain with occasional radiation down the left leg.  Patient was diagnosed with COVID approximately 10 days ago.  Patient overall appears well, reassuring physical exam.  Reassuring lab work.  Troponin is normal.  LFTs are slightly elevated however this appears to be chronic likely due to alcoholism.  Patient states he used to drink a pint of liquor per day but is down to 3 or 4 glasses of wine.  Dizziness is very likely related to the patient's recent COVID diagnosis.  I discussed with the patient continued supportive care at home including plenty of rest and oral hydration.  Given the patient's lower back pain with radiation down the left leg we will prescribe a short course of prednisone.  Patient agreeable to plan of care.  Discussed return precautions.  Nicholas Valdez was evaluated in Emergency Department on 05/03/2021 for the symptoms described in the history of present illness. He was evaluated in the context of the global COVID-19 pandemic, which necessitated consideration that the patient might be at risk for infection with the SARS-CoV-2 virus that causes COVID-19. Institutional protocols and algorithms that pertain to the evaluation of patients at risk for COVID-19 are in a state of rapid change based on information released by regulatory bodies including the CDC and federal and state organizations. These policies and algorithms were followed during the patient's care in the ED.  ____________________________________________   FINAL CLINICAL IMPRESSION(S) / ED DIAGNOSES  Dizziness Low back  pain   Minna Antis, MD 05/03/21 1811

## 2021-05-03 NOTE — ED Provider Notes (Signed)
HPI: Pt is a 40 y.o. male who presents with complaints of dizzyness   The patient p/w  dizzyness, flank pain. Had recent re-assuring workup but had slightly elevated trop and ended leaving AMA. Does report some etoh use today and history of issues with etoh.  Recent ddimer negative recent ct renal negative.   ROS: Denies fever, chest pain, vomiting  Past Medical History:  Diagnosis Date   Alcohol abuse    Anxiety    Depression    Drug abuse (HCC)    GERD (gastroesophageal reflux disease)    Hepatitis C    Hypertension    Pneumonia    Pre-diabetes    Sleep apnea    Vitals:   05/03/21 1653  BP: 137/90  Pulse: (!) 108  Resp: (!) 22  Temp: 98.8 F (37.1 C)  SpO2: 95%    Focused Physical Exam: Gen: No acute distress Head: atraumatic, normocephalic Eyes: Extraocular movements grossly intact; conjunctiva clear CV: RRR Lung: No increased WOB, no stridor GI: ND, no obvious masses Neuro: Alert and awake  Medical Decision Making and Plan: Given the patient's initial medical screening exam, the following diagnostic evaluation has been ordered. The patient will be placed in the appropriate treatment space, once one is available, to complete the evaluation and treatment. I have discussed the plan of care with the patient and I have advised the patient that an ED physician or mid-level practitioner will reevaluate their condition after the test results have been received, as the results may give them additional insight into the type of treatment they may need.   Diagnostics: labs   Treatments: none immediately   Concha Se, MD 05/03/21 1702

## 2021-05-03 NOTE — ED Triage Notes (Signed)
First Nurse Note:   C/O dizziness today.  States recently dx with covid (2 weeks ago).  Also c/o back pain. "Wrenched back at work".  AAOx3.  Skin warm and dry. NAD.

## 2021-05-03 NOTE — ED Triage Notes (Signed)
Pt here with dizziness and SOB. Pt had covid recently and went back to work and has not been able to sleep lately. Pt also is an alcoholic and is concerned about the wait because he may go into withdrawals.

## 2021-06-05 DEATH — deceased

## 2022-01-06 IMAGING — CT CT HEAD W/O CM
3 series · 16 of 47 positions shown, 19 images · non-contrast
Comparison: January 20, 2007

CLINICAL DATA: Mental status change

EXAM:
CT HEAD WITHOUT CONTRAST
TECHNIQUE: Contiguous axial images were obtained from the base of the skull
through the vertex without intravenous contrast.

[Series 2: head wo · axial · 0.47mm/px · z∈[+321,+471]mm · 10 of 36 slices shown, 13 images]
[im 3/36  brain]
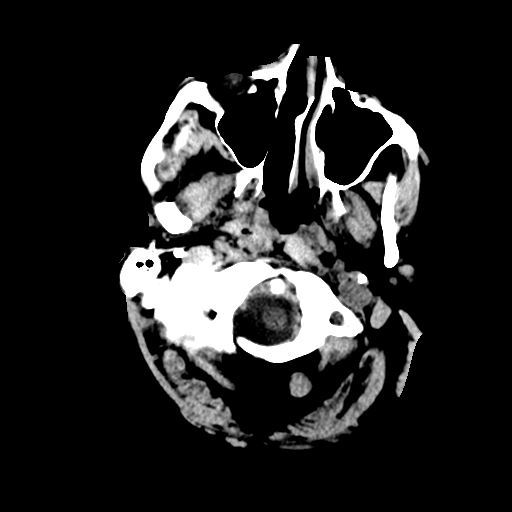
[im 3/36  bone]
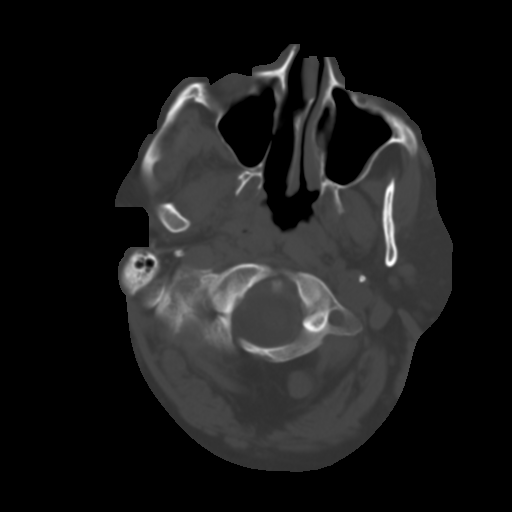
[im 7/36  brain]
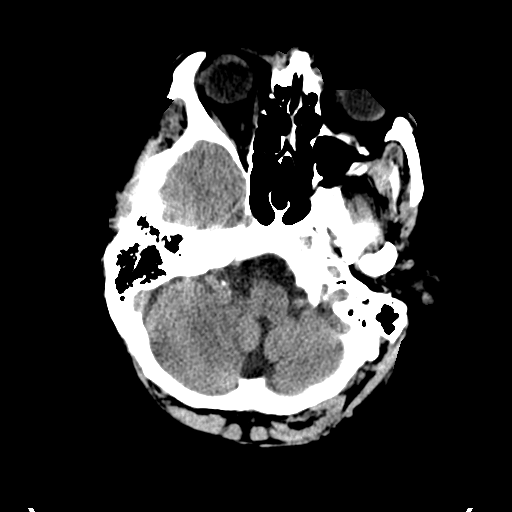
[im 10/36  brain]
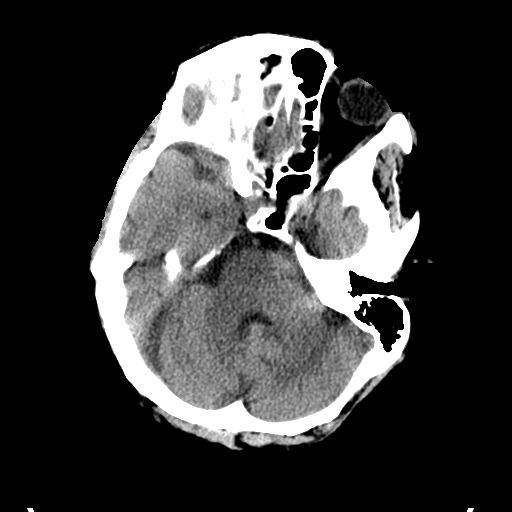
[im 13/36  brain]
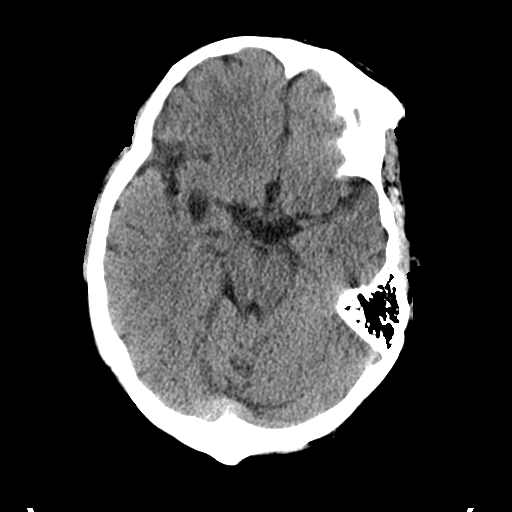
[im 16/36  brain]
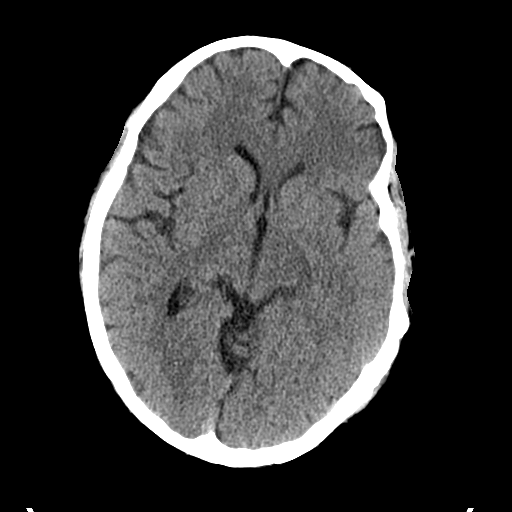
[im 16/36  bone]
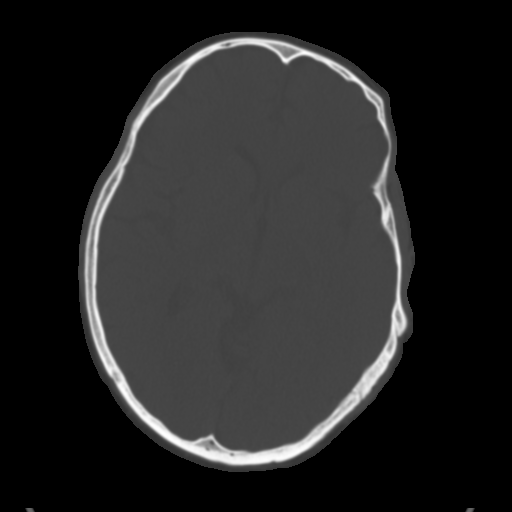
[im 20/36  brain]
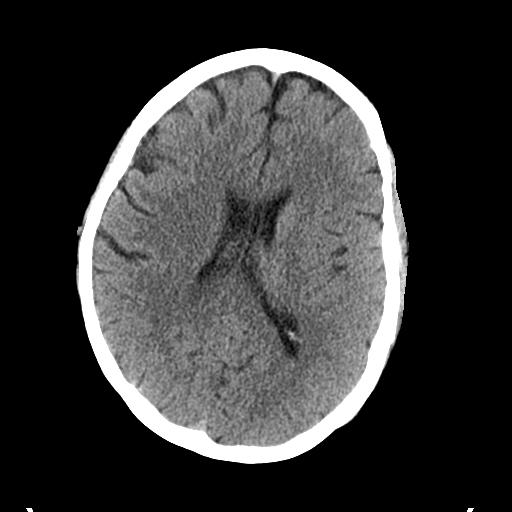
[im 23/36  brain]
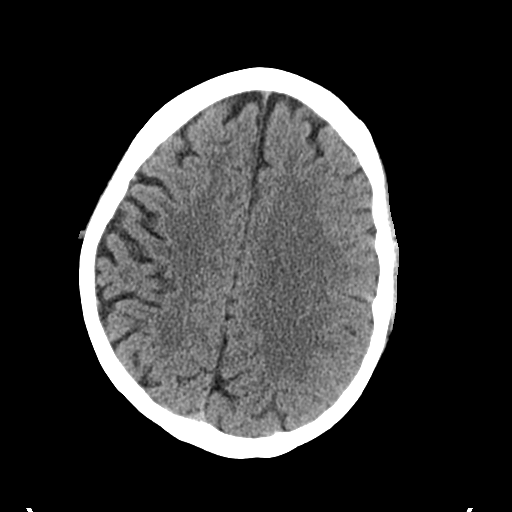
[im 27/36  brain]
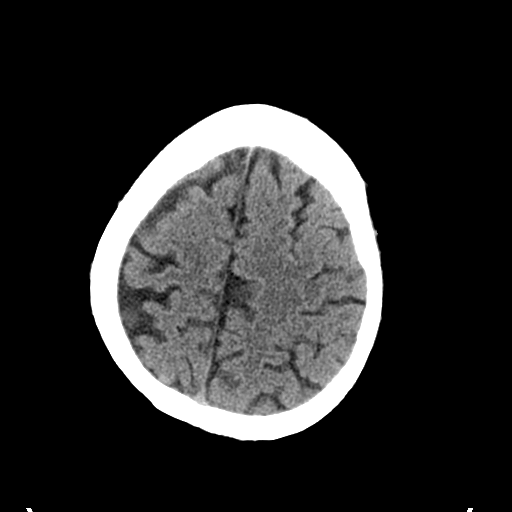
[im 29/36  brain]
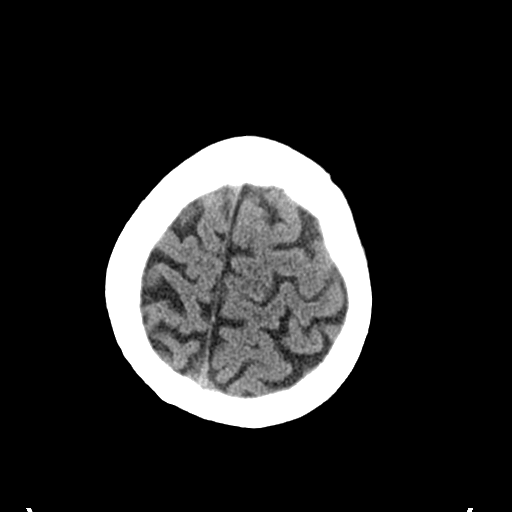
[im 29/36  bone]
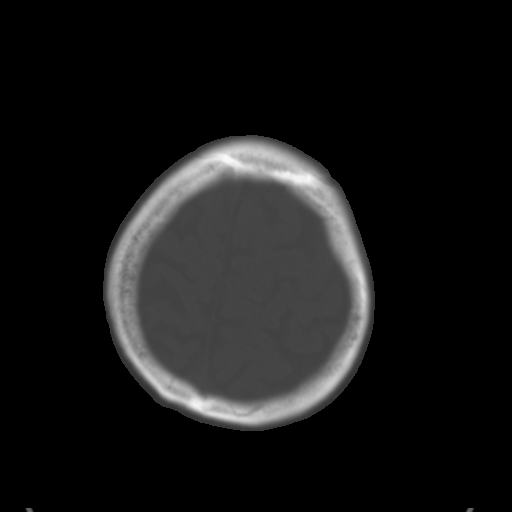
[im 33/36  brain]
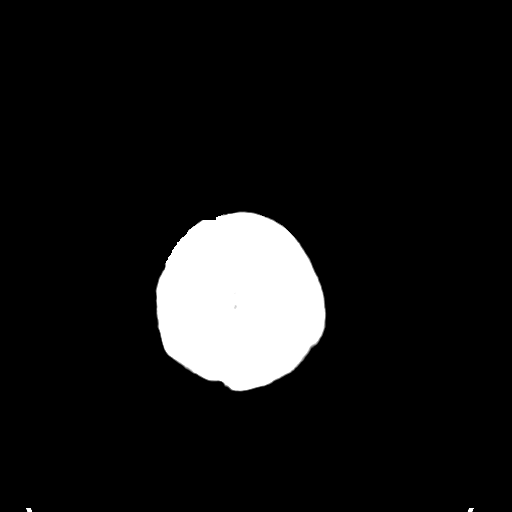

[Series 4: coronal soft tissue · coronal · 0.38mm/px · 3 of 76 slices shown]
[im 26/76  brain]
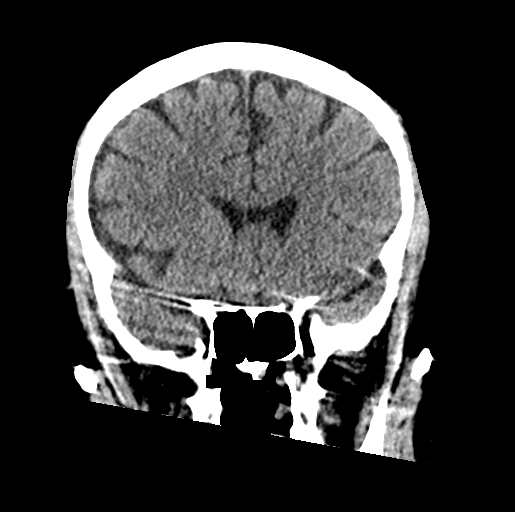
[im 34/76  brain]
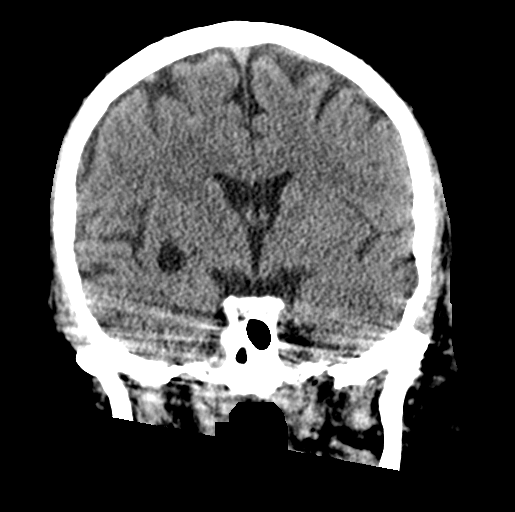
[im 42/76  brain]
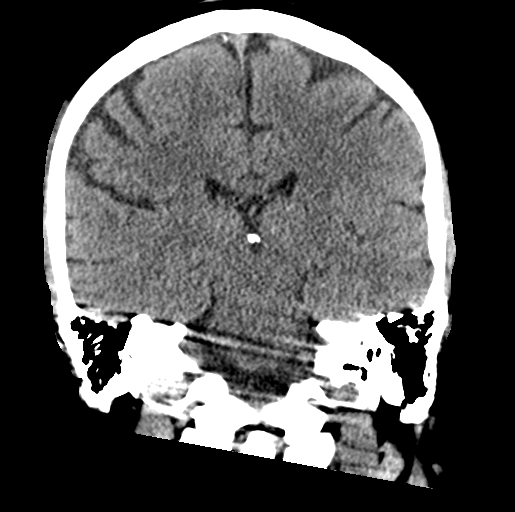

[Series 5: sagittal soft tissue · sagittal · 0.38mm/px · 3 of 65 slices shown]
[im 26/65  brain]
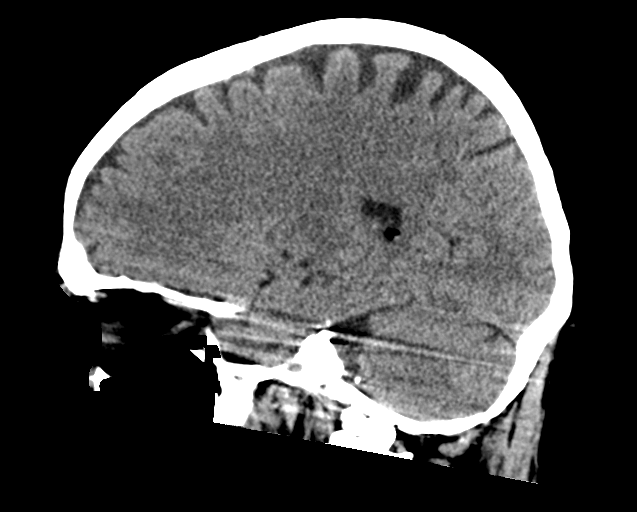
[im 33/65  brain]
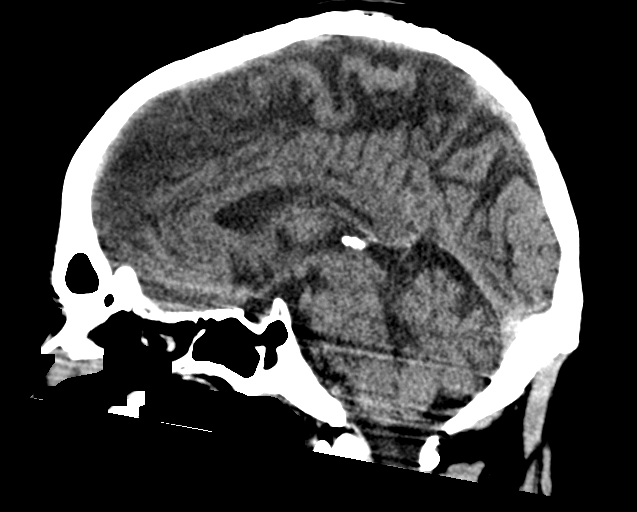
[im 40/65  brain]
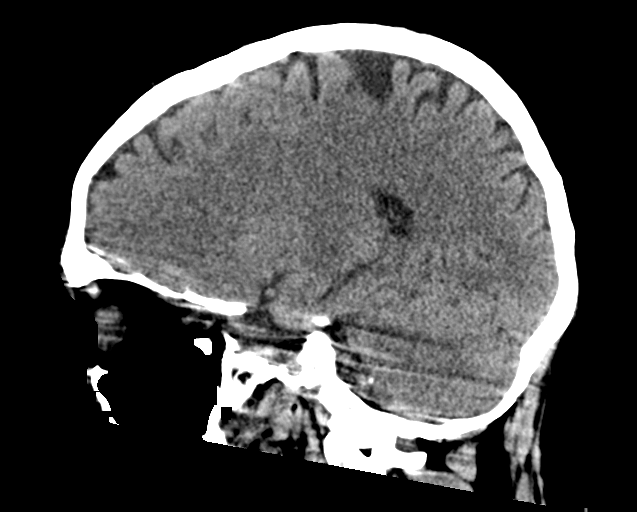

[16 of 47 positions shown; findings below may reference images not displayed]

FINDINGS: Brain: No evidence of acute territorial infarction, hemorrhage,
hydrocephalus,extra-axial collection or mass lesion/mass effect.
There is a hypodense area seen within the right basal ganglia which
could represent an enlarging perivascular space seen on the prior
exam dating back to 6665. Normal gray-white differentiation.
Ventricles are normal in size and contour.

Vascular: No hyperdense vessel or unexpected calcification.

Skull: The skull is intact. No fracture or focal lesion identified.

Sinuses/Orbits: The visualized paranasal sinuses and mastoid air
cells are clear. The orbits and globes intact.

Other: None
IMPRESSION: No acute intracranial abnormality.
